# Patient Record
Sex: Female | Born: 1960 | Race: White | Hispanic: No | Marital: Single | Smoking: Never smoker
Health system: Southern US, Community
[De-identification: ages and names within clinical notes are randomized; demographics above are authoritative.]

## PROBLEM LIST (undated history)

## (undated) DIAGNOSIS — Z923 Personal history of irradiation: Secondary | ICD-10-CM

## (undated) DIAGNOSIS — E039 Hypothyroidism, unspecified: Secondary | ICD-10-CM

## (undated) DIAGNOSIS — M8580 Other specified disorders of bone density and structure, unspecified site: Secondary | ICD-10-CM

## (undated) DIAGNOSIS — J301 Allergic rhinitis due to pollen: Secondary | ICD-10-CM

## (undated) DIAGNOSIS — I10 Essential (primary) hypertension: Secondary | ICD-10-CM

## (undated) DIAGNOSIS — K5790 Diverticulosis of intestine, part unspecified, without perforation or abscess without bleeding: Secondary | ICD-10-CM

## (undated) DIAGNOSIS — Z973 Presence of spectacles and contact lenses: Secondary | ICD-10-CM

## (undated) DIAGNOSIS — C50919 Malignant neoplasm of unspecified site of unspecified female breast: Secondary | ICD-10-CM

## (undated) HISTORY — DX: Diverticulosis of intestine, part unspecified, without perforation or abscess without bleeding: K57.90

## (undated) HISTORY — DX: Allergic rhinitis due to pollen: J30.1

## (undated) HISTORY — DX: Essential (primary) hypertension: I10

## (undated) HISTORY — PX: LAPAROSCOPIC PELVIC LYMPH NODE BIOPSY: SHX5914

## (undated) HISTORY — PX: INGUINAL HERNIA REPAIR: SUR1180

## (undated) HISTORY — PX: TONSILLECTOMY: SUR1361

## (undated) HISTORY — DX: Other specified disorders of bone density and structure, unspecified site: M85.80

## (undated) HISTORY — DX: Malignant neoplasm of unspecified site of unspecified female breast: C50.919

## (undated) HISTORY — PX: WISDOM TOOTH EXTRACTION: SHX21

## (undated) HISTORY — PX: COLONOSCOPY: SHX174

---

## 2006-04-22 HISTORY — PX: OTHER SURGICAL HISTORY: SHX169

## 2008-04-22 DIAGNOSIS — C50919 Malignant neoplasm of unspecified site of unspecified female breast: Secondary | ICD-10-CM

## 2008-04-22 HISTORY — PX: MASTECTOMY: SHX3

## 2008-04-22 HISTORY — DX: Malignant neoplasm of unspecified site of unspecified female breast: C50.919

## 2011-03-03 ENCOUNTER — Encounter: Payer: Self-pay | Admitting: *Deleted

## 2011-03-03 NOTE — Progress Notes (Signed)
Mailed after appt letter to pt. 

## 2011-03-05 ENCOUNTER — Other Ambulatory Visit: Payer: Self-pay | Admitting: *Deleted

## 2011-03-05 DIAGNOSIS — C50919 Malignant neoplasm of unspecified site of unspecified female breast: Secondary | ICD-10-CM

## 2011-03-08 ENCOUNTER — Ambulatory Visit (INDEPENDENT_AMBULATORY_CARE_PROVIDER_SITE_OTHER): Payer: BC Managed Care – PPO | Admitting: Internal Medicine

## 2011-03-08 ENCOUNTER — Encounter: Payer: Self-pay | Admitting: Internal Medicine

## 2011-03-08 DIAGNOSIS — J309 Allergic rhinitis, unspecified: Secondary | ICD-10-CM

## 2011-03-08 DIAGNOSIS — E559 Vitamin D deficiency, unspecified: Secondary | ICD-10-CM

## 2011-03-08 DIAGNOSIS — I10 Essential (primary) hypertension: Secondary | ICD-10-CM

## 2011-03-08 DIAGNOSIS — C50919 Malignant neoplasm of unspecified site of unspecified female breast: Secondary | ICD-10-CM | POA: Insufficient documentation

## 2011-03-08 MED ORDER — LOSARTAN POTASSIUM 25 MG PO TABS: 25.0000 mg | ORAL_TABLET | Freq: Every day | ORAL | Status: DC

## 2011-03-08 NOTE — Progress Notes (Signed)
Subjective:    Patient ID: Erin [name], female    DOB: Dec 05, 1960, 50 y.o.   MRN: [id number]  HPI  50 year old white female to establish. She recently moved to [place] from [place]. She works as a Airline pilot at [employer]. Her past medical history is notable for breast cancer diagnosed in 2010. Patient noted to have stage II, T2 N0M0 with 3.2 cm tumor, ER/PR positive, HER-2/nu negative, status post mastectomy with close surgical margin. She had definitive radiation therapy. She did not undergo chemotherapy. Her followups q. 6 months have been normal. She has upcoming appointment with Dr. Aaron Edelman.  She is currently taking tamoxifen as adjuvant therapy.  Her bone density has been monitored every 2 years. Her last bone density was completed in March 2011. Her bone mineral density of lumbar spine and hips were normal.  Her genetic testing was negative for [facility] abnormality.  Patient also diagnosed with hypertension 2 years ago. Her blood pressure was only mildly elevated. She currently takes losartan 25 mg once daily. She periodically monitors her blood pressure at home. Her blood pressure readings are normal. Patient noted to have occasional elevated readings at physician's office.  Patient also has history of allergic rhinitis. She has been allergy tested in the past. She is very sensitive to grass pollens.   Review of Systems  Constitutional: Negative for activity change, appetite change and unexpected weight change.  Eyes: Negative for visual disturbance.  Respiratory: Negative for cough, chest tightness and shortness of breath.   Cardiovascular: Negative for chest pain.  Genitourinary: Negative for difficulty urinating.  Neurological: Negative for headaches.  Gastrointestinal: Negative for abdominal pain, heartburn melena or hematochezia Psych: Negative for depression or anxiety  Past Medical History  Diagnosis Date  . Breast cancer   . Hypertension   . Allergic rhinitis due to  pollen     History   Social History  . Marital Status: Single    Spouse Name: N/A    Number of Children: N/A  . Years of Education: N/A   Occupational History  . Not on file.   Social History Main Topics  . Smoking status: Never Smoker   . Smokeless tobacco: Not on file  . Alcohol Use: Yes  . Drug Use: No  . Sexually Active: Not on file   Other Topics Concern  . Not on file   Social History Narrative   She grew up in [place] last 16 yrs in [place]    Past Surgical History  Procedure Date  . Mastectomy     bilateral  . Tonsillectomy   . Right knee arthroscopy 2008    No family history on file.  Allergies  Allergen Reactions  . Adhesive (Tape) Rash    No current outpatient prescriptions on file prior to visit.    BP 162/90  Pulse 79  Temp(Src) 98.3 F (36.8 C) (Oral)  Ht 5\' 5"  (1.651 m)  Wt 149 lb (67.586 kg)  BMI 24.79 kg/m2      Objective:   Physical Exam   Constitutional: Appears well-developed and well-nourished. No distress.  Head: Normocephalic and atraumatic.  Ear:  Right and left ear normal.  TMs clear.  Hearing is grossly normal Mouth/Throat: Oropharynx is clear and moist.  Eyes: Conjunctivae are normal. Pupils are equal, round, and reactive to light.  Neck: Normal range of motion. Neck supple. No thyromegaly present. No carotid bruit Chest:  Bilateral mastectomy Cardiovascular: Normal rate, regular rhythm and normal heart sounds.  Exam reveals no gallop and no  friction rub.  No murmur heard. Pulmonary/Chest: Effort normal and breath sounds normal.  No wheezes. No rales.  Abdominal: Soft. Bowel sounds are normal. No mass. There is no tenderness.  Neurological: Alert. No cranial nerve deficit.  Skin: Skin is warm and dry.  Psychiatric: Normal mood and affect. Behavior is normal.       Assessment & Plan:

## 2011-03-08 NOTE — Assessment & Plan Note (Signed)
We discussed allergy blood testing next year.  Patient to call if she needs refill of her Rhinocort and Clarinex. Patient advised to try Allegra or fexofenadine 180 mg over-the-counter.  Patient was treated with allergy shots when she lived in [place].  I anticipate her allergy symptoms may be worse living in [place].

## 2011-03-08 NOTE — Assessment & Plan Note (Signed)
50 year old with history of left breast cancer. She is stage II, T2 N0M0 with 3.2 cm tumor, ER/PR positive, HER-2/nu negative, status post bilateral mastectomy with close surgical margin. She was treated with definitive radiation therapy. She did not have chemotherapy. She is on tamoxifen for adjuvant therapy. She has followup with oncology for surveillance.  I suggest we consider repeating her bone density next year. She has low normal vitamin D level. Patient advised to increase her dose to 4000 units daily.

## 2011-03-08 NOTE — Assessment & Plan Note (Signed)
Home blood pressure readings are very well controlled. She has white coat hypertension. Patient to continue current dose of losartan 25 mg once daily. Monitor electrolytes and kidney function yearly.

## 2011-03-08 NOTE — Patient Instructions (Addendum)
Please complete the following lab tests within one month: BMET - 401.9 Vit D level - 268

## 2011-04-04 ENCOUNTER — Ambulatory Visit (HOSPITAL_BASED_OUTPATIENT_CLINIC_OR_DEPARTMENT_OTHER): Payer: BC Managed Care – PPO | Admitting: Oncology

## 2011-04-04 ENCOUNTER — Other Ambulatory Visit (HOSPITAL_BASED_OUTPATIENT_CLINIC_OR_DEPARTMENT_OTHER): Payer: BC Managed Care – PPO

## 2011-04-04 ENCOUNTER — Ambulatory Visit (HOSPITAL_BASED_OUTPATIENT_CLINIC_OR_DEPARTMENT_OTHER): Payer: BC Managed Care – PPO

## 2011-04-04 VITALS — BP 135/85 | HR 78 | Temp 98.0°F | Ht 64.5 in | Wt 150.8 lb

## 2011-04-04 DIAGNOSIS — Z170 Estrogen receptor positive status [ER+]: Secondary | ICD-10-CM

## 2011-04-04 DIAGNOSIS — C50919 Malignant neoplasm of unspecified site of unspecified female breast: Secondary | ICD-10-CM

## 2011-04-04 LAB — COMPREHENSIVE METABOLIC PANEL
Albumin: 3.8 g/dL (ref 3.5–5.2)
BUN: 13 mg/dL (ref 6–23)
CO2: 26 mEq/L (ref 19–32)
Calcium: 9.5 mg/dL (ref 8.4–10.5)
Glucose, Bld: 88 mg/dL (ref 70–99)
Potassium: 3.8 mEq/L (ref 3.5–5.3)
Sodium: 134 mEq/L — ABNORMAL LOW (ref 135–145)
Total Protein: 7.1 g/dL (ref 6.0–8.3)

## 2011-04-04 LAB — CBC WITH DIFFERENTIAL/PLATELET
Basophils Absolute: 0 10*3/uL (ref 0.0–0.1)
Eosinophils Absolute: 0 10*3/uL (ref 0.0–0.5)
HGB: 13.7 g/dL (ref 11.6–15.9)
MCV: 90.3 fL (ref 79.5–101.0)
MONO#: 0.4 10*3/uL (ref 0.1–0.9)
NEUT#: 4.3 10*3/uL (ref 1.5–6.5)
Platelets: 223 10*3/uL (ref 145–400)
RBC: 4.47 10*6/uL (ref 3.70–5.45)
RDW: 12.5 % (ref 11.2–14.5)
WBC: 6.2 10*3/uL (ref 3.9–10.3)

## 2011-04-07 NOTE — Progress Notes (Signed)
Erin [name]  MR#: [id number]    History of present illness: Erin [name] is a 50 year old woman recently moved to this area with a history of breast cancer, establishing herself in our care today.  She had routine screening mammography December of 2009 at the [facility] in [place], [place], showing an increasing asymmetry in her left breast. She was recalled for additional views December 29. She was noted to have heterogeneously dense breasts. It was a 5 cm area of architectural distortion in the lateral aspect of the left breast with no associated calcifications. There was a second lesion medial to this. Ultrasound showed a highly suspicious hypoechoic irregularly marginated mass measuring 3 cm at the 2:30 position 5 cm from the nipple. This was palpable to the mammographer. The second area in question I measured 7 mm and a third lesion was noted measuring 5 mm. Some left axillary lymph nodes were morphologically normal.  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 Bilateral breast MRIs were performed 05/04/2008 and showed, in the left breast, a large lobulated mass measuring up to 4.3 cm, and including both of the apparently separate masses the previously biopsied. In the right breast there were 2 indeterminate lesions. These were evaluated further with breast specific gamma imaging performed January 14 in both lesions were negative. The lesion in the left breast was markedly abnormal.  Given this complex history and with the background of significant breast density, the patient opted for bilateral mastectomies with left sentinel lymph node sampling. This was performed of 06/27/2008 and showed(S. [id number]) in the right breast, no malignancy. In the left breast there was a 3.2 cm invasive ductal carcinoma, grade 1, focally extending to the  anterior margin of the lower outer quadrant. There was extensive angiolymphatic invasion, but both sentinel lymph nodes on the left were negative.  The patient received left-sided postmastectomy radiation including of the left chest wall and left supraclavicular fossa to a total dose of 50.4 Gy plus a 10 Gy scar boost. She had an Oncotype DX recurrence score of 17, further discussed below. She decided to forego reconstruction. She was tested for BRCA1 and 2 and was found to be negative. Given her overall prognosis, she did not receive radiation, but started tamoxifen February of 2010, with good tolerance.  Past medical history:      Past Medical History  Diagnosis Date  . Breast cancer   . Hypertension   . Allergic rhinitis due to pollen     Past surgical history:      Past Surgical History  Procedure Date  . Mastectomy     bilateral  . Tonsillectomy   . Right knee arthroscopy 2008    Family history:    The patient's mother has a history of recurrent lung cancer. She is currently 49, and lives in [place]. The patient has not been in touch with her father her for approximately 30 years. She had no sisters, 1 brother, who is in good health. There is no breast or ovarian cancer in the family to her knowledge.  Gynecologic history: GX P0, menarche at around age 50, most recent period started 2 days ago. Her periods have never been regular; lately they have been briefer with scant flow.    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 she lives alone and has no pets.  ADVANCED DIRECTIVES:  Health maintenance:       History  Substance Use Topics  . Smoking status: Never Smoker   . Smokeless tobacco: Not on file  . Alcohol Use: Yes      Colonoscopy: n/a  PAP: Feb 2011  Bone density: 06/2009, nl  Cholesterol: "good"  Interval history: The patient recently moved to [facility]; she is establishing herself at the university; so far she is settling in without any unusual  problems. She is exercising on a regular basis.  Review of systems:  She has significant seasonal allergies. Occasionally she feels some tightness or stiffness regarding the left incision. She's never had problems with swelling however. Occasionally she gets sinus associated headaches. She is having moderate hot flashes. A detailed review of systems was otherwise noncontributory.    Allergies:     Allergies  Allergen Reactions  . Adhesive (Tape) Rash    Medications:      Current Outpatient Prescriptions  Medication Sig Dispense Refill  . acyclovir ointment (ZOVIRAX) 5 % Apply topically every 3 (three) hours.        . budesonide (RHINOCORT AQUA) 32 MCG/ACT nasal spray Place 2 sprays into the nose daily.        . Cholecalciferol (VITAMIN D3) 2000 UNITS capsule Take 2,000 Units by mouth daily.        Marland Kitchen desloratadine (CLARINEX) 5 MG tablet Take 5 mg by mouth daily.        Marland Kitchen losartan (COZAAR) 25 MG tablet Take 1 tablet (25 mg total) by mouth daily.  90 tablet  1  . Multiple Vitamin (MULTIVITAMIN) tablet Take 1 tablet by mouth daily.        . tamoxifen (NOLVADEX) 20 MG tablet Take 20 mg by mouth daily.          Physical exam:      Filed Vitals:   04/04/11 1631  BP: 135/85  Pulse: 78  Temp: 98 F (36.7 C)     Body mass index is 25.48 kg/(m^2).   ECOG performance status: 0   Lab results:            Chemistry      Component Value Date/Time   NA 134* 04/04/2011 1605   K 3.8 04/04/2011 1605   CL 99 04/04/2011 1605   CO2 26 04/04/2011 1605   BUN 13 04/04/2011 1605   CREATININE 0.83 04/04/2011 1605      Component Value Date/Time   CALCIUM 9.5 04/04/2011 1605   ALKPHOS 56 04/04/2011 1605   AST 16 04/04/2011 1605   ALT 14 04/04/2011 1605   BILITOT 0.4 04/04/2011 1605         Lab Results  Component Value Date   WBC 6.2 04/04/2011   HGB 13.7 04/04/2011   HCT 40.3 04/04/2011   MCV 90.3 04/04/2011   PLT 223 04/04/2011   NEUTROABS 4.3 04/04/2011    Studies:     As  her ready discussed. She had an unremarkable chest x-ray January of 2010.  Assessment: 50 year old BRCA 1/2 negative Erin [name] woman , status post bilateral mastectomies January 2010 for a left-sided T2 N0 (stage II) invasive ductal carcinoma, grade 1, strongly estrogen and progesterone receptor positive, HER-2 nonamplified, with an Oncotype DX recurrence score of 17, status post left postmastectomy radiation, on tamoxifen since February of 2010, with good tolerance.  Plan: We went over her history in detail. She understands that and not the type recurrence score of 17 falls in the intermediate category as per  the [place] [facility] study, but in the low risk category as per the original interpretation of the test. Accordingly I think the decision to forego chemotherapy was the right one. In addition, her prognostic profile maps out as a luminal a subtype, and again this group of tumors generally does not benefit from chemotherapy.  The plan then is to continue tamoxifen for now. Once she is postmenopausal, she will have the option of switching to an aromatase inhibitor. Whether she would like to be treated with 5 or 10 years total of anti-estrogen therapy will depend partly on when her menses stop, but we did go over that data in detail today including the new data suggesting that 10 years of tamoxifen is superior to 5. She had questions regarding vitamin D and I think her level currently being just above 30 is adequate. She also wondered if she should have some staging studies and we discussed that in detail as well. Basically the strong recommendation in cases like hers is not to do scans except to evaluate specific symptoms.  I would be comfortable seeing her on a once a year basis, and she will see me again in April of 2013. She will have lab work before that visit. She knows to call me for any problems that may develop before that.   [name],[name] 04/07/2011

## 2011-04-08 ENCOUNTER — Other Ambulatory Visit: Payer: Self-pay | Admitting: Lab

## 2011-07-31 ENCOUNTER — Other Ambulatory Visit: Payer: Self-pay | Admitting: *Deleted

## 2011-07-31 DIAGNOSIS — C50919 Malignant neoplasm of unspecified site of unspecified female breast: Secondary | ICD-10-CM

## 2011-08-01 ENCOUNTER — Other Ambulatory Visit (HOSPITAL_BASED_OUTPATIENT_CLINIC_OR_DEPARTMENT_OTHER): Payer: BC Managed Care – PPO | Admitting: Lab

## 2011-08-01 DIAGNOSIS — C50919 Malignant neoplasm of unspecified site of unspecified female breast: Secondary | ICD-10-CM

## 2011-08-01 LAB — CBC WITH DIFFERENTIAL/PLATELET
BASO%: 0.4 % (ref 0.0–2.0)
Eosinophils Absolute: 0.1 10*3/uL (ref 0.0–0.5)
LYMPH%: 24.3 % (ref 14.0–49.7)
MCHC: 35 g/dL (ref 31.5–36.0)
MONO#: 0.4 10*3/uL (ref 0.1–0.9)
NEUT#: 3.8 10*3/uL (ref 1.5–6.5)
RBC: 4.35 10*6/uL (ref 3.70–5.45)
RDW: 12 % (ref 11.2–14.5)
WBC: 5.6 10*3/uL (ref 3.9–10.3)
lymph#: 1.4 10*3/uL (ref 0.9–3.3)

## 2011-08-01 LAB — COMPREHENSIVE METABOLIC PANEL
ALT: 9 U/L (ref 0–35)
Albumin: 3.6 g/dL (ref 3.5–5.2)
CO2: 27 mEq/L (ref 19–32)
Chloride: 100 mEq/L (ref 96–112)
Potassium: 4 mEq/L (ref 3.5–5.3)
Sodium: 136 mEq/L (ref 135–145)
Total Bilirubin: 0.4 mg/dL (ref 0.3–1.2)
Total Protein: 6.5 g/dL (ref 6.0–8.3)

## 2011-08-01 LAB — CANCER ANTIGEN 27.29: CA 27.29: 14 U/mL (ref 0–39)

## 2011-08-07 ENCOUNTER — Other Ambulatory Visit: Payer: Self-pay | Admitting: Oncology

## 2011-08-13 ENCOUNTER — Ambulatory Visit (HOSPITAL_BASED_OUTPATIENT_CLINIC_OR_DEPARTMENT_OTHER): Payer: BC Managed Care – PPO | Admitting: Oncology

## 2011-08-13 VITALS — BP 154/78 | HR 85 | Temp 98.1°F | Ht 64.5 in | Wt 155.1 lb

## 2011-08-13 DIAGNOSIS — E559 Vitamin D deficiency, unspecified: Secondary | ICD-10-CM

## 2011-08-13 DIAGNOSIS — C50919 Malignant neoplasm of unspecified site of unspecified female breast: Secondary | ICD-10-CM

## 2011-08-13 DIAGNOSIS — C50519 Malignant neoplasm of lower-outer quadrant of unspecified female breast: Secondary | ICD-10-CM

## 2011-08-13 DIAGNOSIS — Z170 Estrogen receptor positive status [ER+]: Secondary | ICD-10-CM

## 2011-08-13 MED ORDER — ACYCLOVIR 5 % EX OINT: TOPICAL_OINTMENT | CUTANEOUS | Status: DC

## 2011-08-13 MED ORDER — BUDESONIDE 32 MCG/ACT NA SUSP: 2.0000 | Freq: Every day | NASAL | Status: DC

## 2011-08-13 NOTE — Progress Notes (Signed)
Erin [name]  MR#: [id number]    History of present illness: Erin [name] is a 51 year old woman recently moved to this area with a history of breast cancer, establishing herself in our care today.  She had routine screening mammography December of 2009 at the [facility] in [place], [place], showing an increasing asymmetry in her left breast. She was recalled for additional views December 29. She was noted to have heterogeneously dense breasts. It was a 5 cm area of architectural distortion in the lateral aspect of the left breast with no associated calcifications. There was a second lesion medial to this. Ultrasound showed a highly suspicious hypoechoic irregularly marginated mass measuring 3 cm at the 2:30 position 5 cm from the nipple. This was palpable to the mammographer. The second area in question I measured 7 mm and a third lesion was noted measuring 5 mm. Some left axillary lymph nodes were morphologically normal.  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 Bilateral breast MRIs were performed 05/04/2008 and showed, in the left breast, a large lobulated mass measuring up to 4.3 cm, and including both of the apparently separate masses the previously biopsied. In the right breast there were 2 indeterminate lesions. These were evaluated further with breast specific gamma imaging performed January 14 in both lesions were negative. The lesion in the left breast was markedly abnormal.  Given this complex history and with the background of significant breast density, the patient opted for bilateral mastectomies with left sentinel lymph node sampling. This was performed of 06/27/2008 and showed(S. [id number]) in the right breast, no malignancy. In the left breast there was a 3.2 cm invasive ductal carcinoma, grade 1, focally extending to the  anterior margin of the lower outer quadrant. There was extensive angiolymphatic invasion, but both sentinel lymph nodes on the left were negative.  The patient received left-sided postmastectomy radiation including of the left chest wall and left supraclavicular fossa to a total dose of 50.4 Gy plus a 10 Gy scar boost. She had an Oncotype DX recurrence score of 17, further discussed below. She decided to forego reconstruction. She was tested for BRCA1 and 2 and was found to be negative. Given her overall prognosis, she did not receive radiation, but started tamoxifen February of 2010, with good tolerance.  Interval history: the patient returns today for routine followup of her breast cancer. 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 She has not yet established herself with her primary care physician here. She is exercising regularly, but looking for a better gem. (She does have an elliptical at home).  Review of systems:  Is having mild sinus symptoms from the incredible amount of pollen outside. She takes Claritin with success for that. She has easy bruising, which is not a new problem for her. Last menstrual period was 2 weeks ago. There are perhaps a little bit more scans, but still very regular. She is having mild hot flashes. Otherwise a detailed review of systems today was noncontributory  Past medical history:      Past Medical History  Diagnosis Date  . Breast cancer   . Hypertension   . Allergic rhinitis due to pollen     Past surgical history:      Past Surgical History  Procedure Date  . Mastectomy     bilateral  .  Tonsillectomy   . Right knee arthroscopy 2008    Family history:    The patient's mother has a history of recurrent lung cancer. She is currently 61, and lives in [place]. The patient has not been in touch with her father her for approximately 30 years. She had no sisters, 1 brother, who is in good health. There is no breast or  ovarian cancer in the family to her knowledge.  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    ADVANCED DIRECTIVES: not in place  Health maintenance:       History  Substance Use Topics  . Smoking status: Never Smoker   . Smokeless tobacco: Not on file  . Alcohol Use: Yes      Colonoscopy: n/a  PAP: Feb 2011  Bone density: 06/2009, nl  Cholesterol: "good"    Allergies:     Allergies  Allergen Reactions  . Adhesive (Tape) Rash    Medications:      Current Outpatient Prescriptions  Medication Sig Dispense Refill  . budesonide (RHINOCORT AQUA) 32 MCG/ACT nasal spray Place 2 sprays into the nose daily.        . Cholecalciferol (VITAMIN D3) 2000 UNITS capsule Take 2,000 Units by mouth daily.        Marland Kitchen desloratadine (CLARINEX) 5 MG tablet Take 5 mg by mouth daily.        Marland Kitchen losartan (COZAAR) 25 MG tablet Take 1 tablet (25 mg total) by mouth daily.  90 tablet  1  . Multiple Vitamin (MULTIVITAMIN) tablet Take 1 tablet by mouth daily.        . tamoxifen (NOLVADEX) 20 MG tablet Take 20 mg by mouth daily.        Marland Kitchen acyclovir ointment (ZOVIRAX) 5 % Apply topically every 3 (three) hours.          Physical exam:  Middle-aged white woman who appears fit    Filed Vitals:   08/13/11 1557  BP: 154/78  Pulse: 85  Temp: 98.1 F (36.7 C)     Body mass index is 26.21 kg/(m^2).   ECOG performance status: 0 Oropharynx is clear  There is no peripheral adenopathy  Lungs show no crackles or wheezes  Heart regular rate and rhythm with no murmurs appreciated  Abdomen soft nontender positive bowel sounds  Musculoskeletal exam shows no focal spinal tenderness, no peripheral edema  Neurologic exam is nonfocal  Breast exam shows bilateral mastectomies. There is no evidence of local recurrence.  Lab results:            Chemistry      Component Value  Date/Time   NA 136 08/01/2011 1550   K 4.0 08/01/2011 1550   CL 100 08/01/2011 1550   CO2 27 08/01/2011 1550   BUN 17 08/01/2011 1550   CREATININE 0.81 08/01/2011 1550      Component Value Date/Time   CALCIUM 8.7 08/01/2011 1550   ALKPHOS 58 08/01/2011 1550   AST 17 08/01/2011 1550   ALT 9 08/01/2011 1550   BILITOT 0.4 08/01/2011 1550         Lab Results  Component Value Date   WBC 5.6 08/01/2011   HGB 12.9 08/01/2011   HCT 36.9 08/01/2011   MCV 84.8 08/01/2011   PLT 197 08/01/2011   NEUTROABS 3.8 08/01/2011    Studies:  Dexa scan 07/10/2009 normal  Assessment: 51 year old BRCA 1/2 negative [place] woman , status post bilateral mastectomies January 2010 for a left-sided T2 N0 (stage II) invasive ductal carcinoma, grade 1, strongly estrogen and progesterone receptor positive, HER-2 nonamplified, with an Oncotype DX recurrence score of 17, status post left postmastectomy radiation, on tamoxifen since February of 2010, with good tolerance.  Plan: we again briefly reviewed her prognosis, which is good. She understands she is still premenopausal and therefore we are not ready to switch to an aromatase inhibitor. We also discussed the possibility of continuing tamoxifen for total of 10 years, or going for 5 years and then switching to an aromatase inhibitor. At this point we're simply continuing on tamoxifen, and she will return to see me in 6 months. We will do routine physical exam and lab work at that time.  Today I reviewed some of her medications including the budesonide and Zovirax, and I wrote her for additional breast prostheses and postsurgical bras. She will let me know if she has any trouble obtaining these. Otherwise she will call for any problems that may develop before the next visit.  Lex Linhares C 08/13/2011

## 2011-08-14 ENCOUNTER — Telehealth: Payer: Self-pay | Admitting: Oncology

## 2011-08-14 NOTE — Telephone Encounter (Signed)
S/w the pt and she is aware of her oct 2013 appts 

## 2011-09-04 ENCOUNTER — Encounter: Payer: Self-pay | Admitting: Internal Medicine

## 2011-09-04 ENCOUNTER — Ambulatory Visit (INDEPENDENT_AMBULATORY_CARE_PROVIDER_SITE_OTHER): Payer: BC Managed Care – PPO | Admitting: Internal Medicine

## 2011-09-04 VITALS — BP 110/80 | Temp 98.3°F | Wt 154.0 lb

## 2011-09-04 DIAGNOSIS — J309 Allergic rhinitis, unspecified: Secondary | ICD-10-CM

## 2011-09-04 DIAGNOSIS — M8580 Other specified disorders of bone density and structure, unspecified site: Secondary | ICD-10-CM | POA: Insufficient documentation

## 2011-09-04 DIAGNOSIS — M899 Disorder of bone, unspecified: Secondary | ICD-10-CM

## 2011-09-04 DIAGNOSIS — Z7189 Other specified counseling: Secondary | ICD-10-CM | POA: Insufficient documentation

## 2011-09-04 DIAGNOSIS — Z0000 Encounter for general adult medical examination without abnormal findings: Secondary | ICD-10-CM | POA: Insufficient documentation

## 2011-09-04 MED ORDER — AZELASTINE HCL 0.1 % NA SOLN: 2.0000 | Freq: Two times a day (BID) | NASAL | Status: DC

## 2011-09-04 MED ORDER — LOSARTAN POTASSIUM 25 MG PO TABS: 25.0000 mg | ORAL_TABLET | Freq: Every day | ORAL | Status: DC

## 2011-09-04 NOTE — Assessment & Plan Note (Signed)
Patient has mild osteopenia. Continue vitamin D and calcium supplement. Repeat DEXA scan this year.

## 2011-09-04 NOTE — Patient Instructions (Addendum)
Please complete the following lab tests in June 2013: [facility] Allergy panel - 477.9 Vitamin D level - 268.9

## 2011-09-04 NOTE — Assessment & Plan Note (Signed)
Use allegra 180 mg daily.  Add astelin nose spray.  Arrange allergy panel in June.  If symptoms still uncontrolled, we discussed allergy referral.

## 2011-09-04 NOTE — Assessment & Plan Note (Signed)
Refer for screening colonoscopy especially considering her history of breast cancer.

## 2011-09-04 NOTE — Progress Notes (Signed)
Subjective:    Patient ID: Erin [name], female    DOB: 1961-04-12, 51 y.o.   MRN: [id number]  HPI  51 year old white female with history of breast cancer and hypertension for routine followup. Since previous visit patient was seen by oncologist. The patient to remain on tamoxifen.    Patient reports having flare of allergic rhinitis. She was previously seen by allergist when she was living in [place]. She has multiple sensitivities to various environmental agents. She was previously on allergy shots. She has been currently using over-the-counter Claritin but stopped her Rhinocort secondary to  potential concern of glaucoma. She has upcoming eye doctor appointment.  We reviewed her previous bone density scan was completed in March of 2011. She has very mild osteopenia. She is continuing to take vitamin D 2000 units day and was instructed by her oncologist to take [phone] mg of calcium daily.   Review of Systems  Past Medical History  Diagnosis Date  . Breast cancer   . Hypertension   . Allergic rhinitis due to pollen     History   Social History  . Marital Status: Single    Spouse Name: N/A    Number of Children: N/A  . Years of Education: N/A   Occupational History  . Not on file.   Social History Main Topics  . Smoking status: Never Smoker   . Smokeless tobacco: Not on file  . Alcohol Use: Yes  . Drug Use: No  . Sexually Active: Not on file   Other Topics Concern  . Not on file   Social History Narrative   She grew up in [place] last 16 yrs in [place]    Past Surgical History  Procedure Date  . Mastectomy     bilateral  . Tonsillectomy   . Right knee arthroscopy 2008    No family history on file.  Allergies  Allergen Reactions  . Adhesive (Tape) Rash    Current Outpatient Prescriptions on File Prior to Visit  Medication Sig Dispense Refill  . acyclovir ointment (ZOVIRAX) 5 % Apply topically every 3 (three) hours.  15 g  12  . budesonide  (RHINOCORT AQUA) 32 MCG/ACT nasal spray Place 2 sprays into the nose daily.  8.6 g  12  . Cholecalciferol (VITAMIN D3) 2000 UNITS capsule Take 2,000 Units by mouth daily.        Marland Kitchen desloratadine (CLARINEX) 5 MG tablet Take 5 mg by mouth daily.        Marland Kitchen losartan (COZAAR) 25 MG tablet Take 1 tablet (25 mg total) by mouth daily.  90 tablet  1  . Multiple Vitamin (MULTIVITAMIN) tablet Take 1 tablet by mouth daily.        . tamoxifen (NOLVADEX) 20 MG tablet Take 20 mg by mouth daily.          BP 110/80  Temp(Src) 98.3 F (36.8 C) (Oral)  Wt 154 lb (69.854 kg)       Objective:   Physical Exam  Constitutional: She is oriented to person, place, and time. She appears well-developed and well-nourished.  Cardiovascular: Normal rate, regular rhythm and normal heart sounds.   No murmur heard. Pulmonary/Chest: Effort normal and breath sounds normal. She has no wheezes. She has no rales.  Neurological: She is alert and oriented to person, place, and time.  Skin: Skin is warm and dry.  Psychiatric: She has a normal mood and affect. Her behavior is normal.       Assessment & Plan:

## 2011-09-04 NOTE — Assessment & Plan Note (Signed)
Stable. No change in medication.  BP: 110/80 mmHg  Lab Results  Component Value Date   CREATININE 0.81 08/01/2011

## 2011-09-23 ENCOUNTER — Ambulatory Visit (INDEPENDENT_AMBULATORY_CARE_PROVIDER_SITE_OTHER)
Admission: RE | Admit: 2011-09-23 | Discharge: 2011-09-23 | Disposition: A | Discharge status: Home / Self Care | Payer: BC Managed Care – PPO | Source: Ambulatory Visit | Attending: Internal Medicine | Admitting: Internal Medicine

## 2011-09-23 ENCOUNTER — Other Ambulatory Visit: Payer: BC Managed Care – PPO

## 2011-09-23 DIAGNOSIS — M899 Disorder of bone, unspecified: Secondary | ICD-10-CM

## 2011-09-23 DIAGNOSIS — J309 Allergic rhinitis, unspecified: Secondary | ICD-10-CM

## 2011-09-23 DIAGNOSIS — M8580 Other specified disorders of bone density and structure, unspecified site: Secondary | ICD-10-CM

## 2011-09-24 LAB — ALLERGY PROFILE REGION II-DC, DE, MD, ~~LOC~~, VA
Allergen, D pternoyssinus,d7: <0.1 kU/L (ref <0.35)
Alternaria Alternata: <0.1 kU/L (ref <0.35)
Aspergillus fumigatus, IgG: <0.1 kU/L (ref <0.35)
Cat Dander: <0.1 kU/L (ref <0.35)
D. farinae: <0.1 kU/L (ref <0.35)
Elm IgE: <0.1 kU/L (ref <0.35)
IgE (Immunoglobulin E), Serum: 7.7 IU/mL (ref 0.0–180.0)
Oak: <0.1 kU/L (ref <0.35)

## 2011-09-27 ENCOUNTER — Encounter: Payer: Self-pay | Admitting: Internal Medicine

## 2011-10-03 NOTE — Progress Notes (Signed)
Pt informed- appointment made with labs prior

## 2011-10-17 ENCOUNTER — Encounter: Payer: Self-pay | Admitting: Internal Medicine

## 2011-10-18 ENCOUNTER — Ambulatory Visit (INDEPENDENT_AMBULATORY_CARE_PROVIDER_SITE_OTHER): Payer: BC Managed Care – PPO | Admitting: Internal Medicine

## 2011-10-18 ENCOUNTER — Encounter: Payer: Self-pay | Admitting: Internal Medicine

## 2011-10-18 VITALS — BP 112/66 | HR 94 | Ht 65.0 in | Wt 151.2 lb

## 2011-10-18 VITALS — BP 132/82 | HR 84 | Temp 98.4°F | Wt 152.0 lb

## 2011-10-18 DIAGNOSIS — M899 Disorder of bone, unspecified: Secondary | ICD-10-CM

## 2011-10-18 DIAGNOSIS — M8580 Other specified disorders of bone density and structure, unspecified site: Secondary | ICD-10-CM

## 2011-10-18 DIAGNOSIS — Z1211 Encounter for screening for malignant neoplasm of colon: Secondary | ICD-10-CM

## 2011-10-18 MED ORDER — NA SULFATE-K SULFATE-MG SULF 17.5-3.13-1.6 GM/177ML PO SOLN: 1.0000 | Freq: Once | ORAL | Status: DC

## 2011-10-18 NOTE — Patient Instructions (Addendum)
Keep your next follow up appointment in October 2013. Please complete the following lab tests before your next follow up appointment: BMET - 401.9 Vitamin D level - 733.9

## 2011-10-18 NOTE — Assessment & Plan Note (Signed)
Recent DEXA scan shows normal T. scores of lumbar spine. Right hip T score was -1.2. Continue calcium, vitamin D and weight bearing exercises.  Her osteoporosis risk will increase when she transitions to aromatase inhibitor.

## 2011-10-18 NOTE — Patient Instructions (Addendum)
You have been scheduled for a colonoscopy with propofol. Please follow written instructions given to you at your visit today.  Please pick up your prep kit at the pharmacy within the next 1-3 days.  

## 2011-10-18 NOTE — Progress Notes (Signed)
Patient ID: Erin [name], female   DOB: 1961-03-01, 51 y.o.   MRN: [id number]  SUBJECTIVE: HPI Erin [name] is a 51 year old female with a past medical history of breast cancer status post bilateral mastectomy and XRT on tamoxifen, hypertension and allergic rhinitis who seen in consultation at the request of Dr. Artist Pais for evaluation screening colonoscopy. She is alone today. She is doing very well and is without complaint. Her bowel habits are normal for her. She has not had blood in her stools or melena. No diarrhea or constipation.  No abdominal pain. Appetite is good. Weight is stable. No nausea or vomiting. No trouble with heartburn.  She wishes to discuss options for colon cancer screening and notes she will be 51 soon. She does not have a family history of colon polyps or colon cancer to  Review of Systems  As per history of present illness, otherwise negative   Past Medical History  Diagnosis Date  . Breast cancer   . Hypertension   . Allergic rhinitis due to pollen     Current Outpatient Prescriptions  Medication Sig Dispense Refill  . acyclovir ointment (ZOVIRAX) 5 % Apply topically every 3 (three) hours.  15 g  12  . azelastine (ASTELIN) 137 MCG/SPRAY nasal spray Place 2 sprays into the nose 2 (two) times daily. Use in each nostril as directed  30 mL  12  . budesonide (RHINOCORT AQUA) 32 MCG/ACT nasal spray Place 2 sprays into the nose daily. As needed      . calcium carbonate (OS-CAL) 600 MG TABS Take 600 mg by mouth 2 (two) times daily with a meal.      . Cholecalciferol (VITAMIN D3) 2000 UNITS capsule Take 2,000 Units by mouth daily.        Marland Kitchen desloratadine (CLARINEX) 5 MG tablet Take 5 mg by mouth daily. As needed      . losartan (COZAAR) 25 MG tablet Take 1 tablet (25 mg total) by mouth daily.  90 tablet  3  . Multiple Vitamin (MULTIVITAMIN) tablet Take 1 tablet by mouth daily.        . tamoxifen (NOLVADEX) 20 MG tablet Take 20 mg by mouth daily.        Marland Kitchen DISCONTD: budesonide  (RHINOCORT AQUA) 32 MCG/ACT nasal spray Place 2 sprays into the nose daily.  8.6 g  12  . Na Sulfate-K Sulfate-Mg Sulf (SUPREP BOWEL PREP) SOLN Take 1 kit by mouth once.  1 Bottle  0    Allergies  Allergen Reactions  . Adhesive (Tape) Rash    Family History  Problem Relation Age of Onset  . Hypertension Mother   . Hypertension Father   . Cancer Mother     Lung Cancer - long history of tobacco use    History  Substance Use Topics  . Smoking status: Never Smoker   . Smokeless tobacco: Not on file  . Alcohol Use: Yes    OBJECTIVE: BP 112/66  Pulse 94  Ht 5\' 5"  (1.651 m)  Wt 151 lb 3.2 oz (68.584 kg)  BMI 25.16 kg/m2 Constitutional: Well-developed and well-nourished. No distress. HEENT: Normocephalic and atraumatic. Oropharynx is clear and moist. No oropharyngeal exudate. Conjunctivae are normal. Pupils are equal round and reactive to light. No scleral icterus. Neck: Neck supple. Trachea midline. Cardiovascular: Normal rate, regular rhythm and intact distal pulses. No M/R/G Pulmonary/chest: Effort normal and breath sounds normal. No wheezing, rales or rhonchi. Abdominal: Soft, nontender, nondistended. Bowel sounds active throughout. There are no masses palpable.  No hepatosplenomegaly. Extremities: no clubbing, cyanosis, or edema Lymphadenopathy: No cervical adenopathy noted. Neurological: Alert and oriented to person place and time. Skin: Skin is warm and dry. No rashes noted. Psychiatric: Normal mood and affect. Behavior is normal.  ASSESSMENT AND PLAN:  51 year old female with a past medical history of breast cancer status post bilateral mastectomy and XRT on tamoxifen, hypertension and allergic rhinitis who seen in consultation at the request of Dr. Artist Pais for evaluation screening colonoscopy.  1. CRC screening, average risk -- the patient is well today and we have discussed primary prevention with colorectal cancer screening today in length. We discussed her options which  include colonoscopy, annual FOBT, barium enema, CT colonography.  We discussed the risks and benefits of colonoscopy in great detail. After this discussion, she wishes to proceed with colonoscopy. This will be performed with propofol sedation and she will use Suprep in split fashion.  We discussed her screening interval will be based on findings of this colonoscopy.

## 2011-10-18 NOTE — Progress Notes (Signed)
Subjective:    Patient ID: Erin [name], female    DOB: 1960-09-07, 51 y.o.   MRN: [id number]  HPI  51 year old white female with history of breast cancer and osteopenia for followup. Reviewed her recent DEXA scan. Compared to 2011 she has had mild progression. Her T score of her right hip is -1.2. She scores of lumbar spine are normal. She continues to take 2000 units of vitamin D daily along with 1200 mg of calcium.  No family hx of osteoporotic fractures.  She is told taking tamoxifen. She plans on transitioning to an aromatase inhibitor possibly within the next 2-3 years.  Her mother has been diagnosed with terminal lung cancer. She is trying to move her mother to [place] area. She is looking for assisted living facilities.  Allergy panel reviewed.  Previous skin testing in [place] showed sensitivity to grass pollen.  Review of Systems Negative for allergy symptoms.    Past Medical History  Diagnosis Date  . Breast cancer   . Hypertension   . Allergic rhinitis due to pollen     History   Social History  . Marital Status: Single    Spouse Name: N/A    Number of Children: N/A  . Years of Education: N/A   Occupational History  . Not on file.   Social History Main Topics  . Smoking status: Never Smoker   . Smokeless tobacco: Not on file  . Alcohol Use: Yes  . Drug Use: No  . Sexually Active: Not on file   Other Topics Concern  . Not on file   Social History Narrative   She grew up in [place] last 16 yrs in [place]    Past Surgical History  Procedure Date  . Mastectomy     bilateral  . Tonsillectomy   . Right knee arthroscopy 2008    Family History  Problem Relation Age of Onset  . Hypertension Mother   . Hypertension Father   . Cancer Mother     Lung Cancer - long history of tobacco use    Allergies  Allergen Reactions  . Adhesive (Tape) Rash    Current Outpatient Prescriptions on File Prior to Visit  Medication Sig Dispense Refill    . acyclovir ointment (ZOVIRAX) 5 % Apply topically every 3 (three) hours.  15 g  12  . budesonide (RHINOCORT AQUA) 32 MCG/ACT nasal spray Place 2 sprays into the nose daily. As needed      . calcium carbonate (OS-CAL) 600 MG TABS Take 600 mg by mouth 2 (two) times daily with a meal.      . Cholecalciferol (VITAMIN D3) 2000 UNITS capsule Take 2,000 Units by mouth daily.        Marland Kitchen desloratadine (CLARINEX) 5 MG tablet Take 5 mg by mouth daily. As needed      . losartan (COZAAR) 25 MG tablet Take 1 tablet (25 mg total) by mouth daily.  90 tablet  3  . Na Sulfate-K Sulfate-Mg Sulf (SUPREP BOWEL PREP) SOLN Take 1 kit by mouth once.  1 Bottle  0  . tamoxifen (NOLVADEX) 20 MG tablet Take 20 mg by mouth daily.        Marland Kitchen DISCONTD: budesonide (RHINOCORT AQUA) 32 MCG/ACT nasal spray Place 2 sprays into the nose daily.  8.6 g  12    BP 132/82  Pulse 84  Temp 98.4 F (36.9 C) (Oral)  Wt 152 lb (68.947 kg)       Objective:   Physical Exam  Constitutional: She is oriented to person, place, and time. She appears well-developed and well-nourished.  Cardiovascular: Normal rate, regular rhythm and normal heart sounds.   Pulmonary/Chest: Effort normal and breath sounds normal. She has no wheezes.  Neurological: She is alert and oriented to person, place, and time.  Psychiatric: She has a normal mood and affect. Her behavior is normal.          Assessment & Plan:

## 2011-12-10 ENCOUNTER — Encounter: Payer: BC Managed Care – PPO | Admitting: Internal Medicine

## 2011-12-12 ENCOUNTER — Encounter: Payer: BC Managed Care – PPO | Admitting: Internal Medicine

## 2012-01-10 ENCOUNTER — Ambulatory Visit: Payer: BC Managed Care – PPO | Admitting: Internal Medicine

## 2012-01-17 ENCOUNTER — Ambulatory Visit (INDEPENDENT_AMBULATORY_CARE_PROVIDER_SITE_OTHER): Payer: BC Managed Care – PPO | Admitting: Internal Medicine

## 2012-01-17 ENCOUNTER — Encounter: Payer: Self-pay | Admitting: Internal Medicine

## 2012-01-17 VITALS — BP 120/78 | HR 74 | Temp 98.2°F | Wt 150.0 lb

## 2012-01-17 DIAGNOSIS — I10 Essential (primary) hypertension: Secondary | ICD-10-CM

## 2012-01-17 DIAGNOSIS — Z23 Encounter for immunization: Secondary | ICD-10-CM

## 2012-01-17 DIAGNOSIS — M949 Disorder of cartilage, unspecified: Secondary | ICD-10-CM

## 2012-01-17 DIAGNOSIS — M8580 Other specified disorders of bone density and structure, unspecified site: Secondary | ICD-10-CM

## 2012-01-17 DIAGNOSIS — C50919 Malignant neoplasm of unspecified site of unspecified female breast: Secondary | ICD-10-CM

## 2012-01-17 LAB — BASIC METABOLIC PANEL
BUN: 17 mg/dL (ref 6–23)
Creatinine, Ser: 0.8 mg/dL (ref 0.4–1.2)
GFR: 86.88 mL/min (ref >60.00)
Glucose, Bld: 87 mg/dL (ref 70–99)
Potassium: 4.2 mEq/L (ref 3.5–5.1)

## 2012-01-17 LAB — CBC WITH DIFFERENTIAL/PLATELET
Eosinophils Relative: 0.7 % (ref 0.0–5.0)
HCT: 40.8 % (ref 36.0–46.0)
Monocytes Relative: 8.1 % (ref 3.0–12.0)
Neutrophils Relative %: 68.1 % (ref 43.0–77.0)
Platelets: 209 10*3/uL (ref 150.0–400.0)
WBC: 6.3 10*3/uL (ref 4.5–10.5)

## 2012-01-17 LAB — HEPATIC FUNCTION PANEL
ALT: 13 U/L (ref 0–35)
AST: 18 U/L (ref 0–37)
Albumin: 3.8 g/dL (ref 3.5–5.2)

## 2012-01-17 LAB — TSH: TSH: 1.96 u[IU]/mL (ref 0.35–5.50)

## 2012-01-17 NOTE — Assessment & Plan Note (Signed)
Continue calcium and vitamin D supplementation. Obtain vitamin D level. Plan to repeat DEXA scan in 2015.

## 2012-01-17 NOTE — Assessment & Plan Note (Signed)
Continue tamoxifen for adjuvant therapy.   Patient to transition to aromatase inhibitor as per Dr. Darnelle Catalan.

## 2012-01-17 NOTE — Patient Instructions (Addendum)
Please complete the following lab tests before your next follow up appointment: BMET - 401.9 FLP, CRP - 401.9

## 2012-01-17 NOTE — Assessment & Plan Note (Signed)
Well controlled.  Continue losartan 25 mg once daily. Monitor electrolytes and kidney function. Marland Kitchenlastbp1

## 2012-01-17 NOTE — Progress Notes (Signed)
Subjective:    Patient ID: Erin [name], female    DOB: 1960/07/30, 51 y.o.   MRN: [id number]  HPI  51 year old white female with history of breast cancer, hypertension and osteopenia for follow up.  Overall patient has been doing well. She is very compliant with her blood pressure medications. She denies any lightheadedness or dizziness.  Unfortunately her mother passed away since her last visit. It was a difficult summer to make funeral arrangements.  Osteopenia-patient condition continues to take her calcium and vitamin D supplement daily. We are awaiting her vitamin D level. She is taking additional 2000 units daily.  She was referred for screening colonoscopy. She could not complete during the summer to do family circumstances. She is having difficulty rescheduling for December of 2013.  Review of Systems See HPI  Past Medical History  Diagnosis Date  . Breast cancer   . Hypertension   . Allergic rhinitis due to pollen     History   Social History  . Marital Status: Single    Spouse Name: N/A    Number of Children: N/A  . Years of Education: N/A   Occupational History  . Not on file.   Social History Main Topics  . Smoking status: Never Smoker   . Smokeless tobacco: Not on file  . Alcohol Use: Yes  . Drug Use: No  . Sexually Active: Not on file   Other Topics Concern  . Not on file   Social History Narrative   She grew up in [place] last 16 yrs in [place]    Past Surgical History  Procedure Date  . Mastectomy     bilateral  . Tonsillectomy   . Right knee arthroscopy 2008    Family History  Problem Relation Age of Onset  . Hypertension Mother   . Hypertension Father   . Cancer Mother     Lung Cancer - long history of tobacco use    Allergies  Allergen Reactions  . Adhesive (Tape) Rash    Current Outpatient Prescriptions on File Prior to Visit  Medication Sig Dispense Refill  . acyclovir ointment (ZOVIRAX) 5 % Apply topically every 3  (three) hours.  15 g  12  . budesonide (RHINOCORT AQUA) 32 MCG/ACT nasal spray Place 2 sprays into the nose daily. As needed      . calcium carbonate (OS-CAL) 600 MG TABS Take 600 mg by mouth 2 (two) times daily with a meal.      . Cholecalciferol (VITAMIN D3) 2000 UNITS capsule Take 2,000 Units by mouth daily.        Marland Kitchen loratadine (CLARITIN) 10 MG tablet Take 10 mg by mouth daily as needed.      Marland Kitchen losartan (COZAAR) 25 MG tablet Take 1 tablet (25 mg total) by mouth daily.  90 tablet  3  . Na Sulfate-K Sulfate-Mg Sulf (SUPREP BOWEL PREP) SOLN Take 1 kit by mouth once.  1 Bottle  0  . tamoxifen (NOLVADEX) 20 MG tablet Take 20 mg by mouth daily.          BP 120/78  Pulse 74  Temp 98.2 F (36.8 C) (Oral)  Wt 150 lb (68.04 kg)  SpO2 97%      Objective:   Physical Exam  Constitutional: She appears well-developed and well-nourished.  HENT:  Head: Normocephalic and atraumatic.  Cardiovascular: Normal rate and regular rhythm.   Pulmonary/Chest: Effort normal and breath sounds normal. She has no wheezes.  Skin: Skin is warm and dry. No  rash noted.       No suspicious skin lesions  Psychiatric: She has a normal mood and affect. Her behavior is normal.        Assessment & Plan:

## 2012-01-18 LAB — VITAMIN D 25 HYDROXY (VIT D DEFICIENCY, FRACTURES): Vit D, 25-Hydroxy: 57 ng/mL (ref 30–89)

## 2012-02-05 ENCOUNTER — Encounter: Payer: BC Managed Care – PPO | Admitting: Internal Medicine

## 2012-02-05 ENCOUNTER — Other Ambulatory Visit: Payer: BC Managed Care – PPO

## 2012-02-05 ENCOUNTER — Other Ambulatory Visit: Payer: BC Managed Care – PPO | Admitting: Lab

## 2012-02-13 ENCOUNTER — Ambulatory Visit: Payer: BC Managed Care – PPO | Admitting: Oncology

## 2012-02-13 ENCOUNTER — Ambulatory Visit (HOSPITAL_BASED_OUTPATIENT_CLINIC_OR_DEPARTMENT_OTHER): Payer: BC Managed Care – PPO | Admitting: Oncology

## 2012-02-13 ENCOUNTER — Telehealth: Payer: Self-pay | Admitting: Oncology

## 2012-02-13 VITALS — BP 132/76 | HR 96 | Temp 98.1°F | Resp 20 | Ht 65.0 in | Wt 151.1 lb

## 2012-02-13 DIAGNOSIS — C50519 Malignant neoplasm of lower-outer quadrant of unspecified female breast: Secondary | ICD-10-CM

## 2012-02-13 DIAGNOSIS — C50919 Malignant neoplasm of unspecified site of unspecified female breast: Secondary | ICD-10-CM

## 2012-02-13 DIAGNOSIS — M899 Disorder of bone, unspecified: Secondary | ICD-10-CM

## 2012-02-13 DIAGNOSIS — Z170 Estrogen receptor positive status [ER+]: Secondary | ICD-10-CM

## 2012-02-13 MED ORDER — TAMOXIFEN CITRATE 20 MG PO TABS: 20.0000 mg | ORAL_TABLET | Freq: Every day | ORAL | Status: DC

## 2012-02-13 NOTE — Telephone Encounter (Signed)
gve the pt her April-may 2014 appt calendar along with the PPT TO SEE DR Danella Deis IN MAY 2014.

## 2012-02-13 NOTE — Progress Notes (Signed)
Erin [name]  MR#: [id number]    PCP: Thomos Lemons, DO   History of present illness: Erin [name] had routine screening mammography December of 2009 at the [facility] in [place], [place], showing an increasing asymmetry in her left breast. She was recalled for additional views December 29. She was noted to have heterogeneously dense breasts. It was a 5 cm area of architectural distortion in the lateral aspect of the left breast with no associated calcifications. There was a second lesion medial to this. Ultrasound showed a highly suspicious hypoechoic irregularly marginated mass measuring 3 cm at the 2:30 position 5 cm from the nipple. This was palpable to the mammographer. The second area in question I measured 7 mm and a third lesion was noted measuring 5 mm. Some left axillary lymph nodes were morphologically normal.  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 Bilateral breast MRIs were performed 05/04/2008 and showed, in the left breast, a large lobulated mass measuring up to 4.3 cm, and including both of the apparently separate masses the previously biopsied. In the right breast there were 2 indeterminate lesions. These were evaluated further with breast specific gamma imaging performed January 14 in both lesions were negative. The lesion in the left breast was markedly abnormal.  Given this complex history and with the background of significant breast density, the patient opted for bilateral mastectomies with left sentinel lymph node sampling. This was performed of 06/27/2008 and showed(S. [id number]) in the right breast, no malignancy. In the left breast there was a 3.2 cm invasive ductal carcinoma, grade 1, focally extending to the anterior margin of the lower outer quadrant. There was extensive angiolymphatic invasion, but both sentinel  lymph nodes on the left were negative.  The patient received left-sided postmastectomy radiation including of the left chest wall and left supraclavicular fossa to a total dose of 50.4 Gy plus a 10 Gy scar boost. She had an Oncotype DX recurrence score of 17, further discussed below. She decided to forego reconstruction. She was tested for BRCA1 and 2 and was found to be negative. Given her overall prognosis, she did not receive radiation, but started tamoxifen February of 2010, with good tolerance. Her subsequent history is as detailed below.  Interval history: The patient returns for followup of her breast cancer. Interval history significant for her mother having died from complications of lung cancer. Erin [name] continues to work at [employer] likes her job, and is exercising regularly mostly on an elliptical  Review of systems:   She bruises easily and has hot flashes but mostly she had many questions regarding vitamin D, osteopenia, tamoxifen, and some skin lesions. 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  Past medical history:      Past Medical History  Diagnosis Date  . Breast cancer   . Hypertension   . Allergic rhinitis due to pollen     Past surgical history:      Past Surgical History  Procedure Date  . Mastectomy     bilateral  . Tonsillectomy   . Right knee arthroscopy 09-06-2006    Family history:  The patient's mother died in 2011-09-06 from complications of lung cancer. The patient has not been in touch with her father her for approximately 30 years. She had  no sisters, 1 brother, who is in good health. There is no breast or ovarian cancer in the family to her knowledge.  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 She lives alone and has no pets.    ADVANCED DIRECTIVES: not in place  Health  maintenance:       History  Substance Use Topics  . Smoking status: Never Smoker   . Smokeless tobacco: Not on file  . Alcohol Use: Yes      Colonoscopy: n/a  PAP: Feb 2011  Bone density: 06/2009, nl  Cholesterol: "good"    Allergies:     Allergies  Allergen Reactions  . Adhesive (Tape) Rash    Medications:      Current Outpatient Prescriptions  Medication Sig Dispense Refill  . calcium carbonate (OS-CAL) 600 MG TABS Take 600 mg by mouth 2 (two) times daily with a meal.      . Cholecalciferol (VITAMIN D3) 2000 UNITS capsule Take 2,000 Units by mouth daily.        Marland Kitchen losartan (COZAAR) 25 MG tablet Take 1 tablet (25 mg total) by mouth daily.  90 tablet  3  . tamoxifen (NOLVADEX) 20 MG tablet Take 20 mg by mouth daily.        Marland Kitchen acyclovir ointment (ZOVIRAX) 5 % Apply topically every 3 (three) hours.  15 g  12  . budesonide (RHINOCORT AQUA) 32 MCG/ACT nasal spray Place 2 sprays into the nose daily. As needed      . loratadine (CLARITIN) 10 MG tablet Take 10 mg by mouth daily as needed.      . Na Sulfate-K Sulfate-Mg Sulf (SUPREP BOWEL PREP) SOLN Take 1 kit by mouth once.  1 Bottle  0    Physical exam:  Middle-aged white woman who appears fit    Filed Vitals:   02/13/12 1400  BP: 132/76  Pulse: 96  Temp: 98.1 F (36.7 C)  Resp: 20     Body mass index is 25.14 kg/(m^2).   ECOG performance status: 0 Oropharynx is clear  There is no peripheral adenopathy  Lungs show no crackles or wheezes  Heart regular rate and rhythm with no murmurs appreciated  Abdomen soft nontender positive bowel sounds  Musculoskeletal exam shows no focal spinal tenderness, no peripheral edema  Neurologic exam is nonfocal  Breast exam shows bilateral mastectomies. There is no evidence of local recurrence.  Lab results:            Chemistry      Component Value Date/Time   NA 135 01/17/2012 1539   K 4.2 01/17/2012 1539   CL 103 01/17/2012 1539   CO2 26 01/17/2012 1539   BUN 17 01/17/2012  1539   CREATININE 0.8 01/17/2012 1539      Component Value Date/Time   CALCIUM 8.8 01/17/2012 1539   ALKPHOS 43 01/17/2012 1539   AST 18 01/17/2012 1539   ALT 13 01/17/2012 1539   BILITOT 0.7 01/17/2012 1539         Lab Results  Component Value Date   WBC 6.3 01/17/2012   HGB 13.5 01/17/2012   HCT 40.8 01/17/2012   MCV 90.8 01/17/2012   PLT 209.0 01/17/2012   NEUTROABS 4.3 01/17/2012    Studies:    Dexa scan 07/10/2009 normal; repeat DEXA scan April 2013 shows minimal osteopenia (T score -1.2)  Assessment: 51 year old BRCA 1/2 negative  [place] woman ,  (1) status post bilateral mastectomies January 2010 for a 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 As expected. She is doing quite well with very minimal osteopenia. Dr.Yoo has started her on standard calcium/vitamin D supplementation, and she wonders if she needs to take additional vitamin D. A recent review of showed fairly convincingly that vitamin D above a normal level it has no effect whatsoever on osteoporosis. [facility] her vitamin D level is greater than 30 she's fine. Currently her vitamin D level is almost 60. Accordingly am very comfortable with her of stopping her multivitamin, she wants to continue however the additional 2000 mg supplement together with the thousand that she gets with her 2 calcium tablets. That is all perfectly reasonable  I cannot find any evidence that that tamoxifen affects retinal nerve fiber density. I think she may want to discuss this with Dr. Charlotte Sanes. I saw no suspicious skin lesions the day but she does not have a  dermatologist and we are scheduling her to see Dr. Danella Deis for prophylaxis. Otherwise the chart will return to see me in 6 months. She knows to call for any problems that may develop before that visit Bryony Kaman C 02/13/2012

## 2012-03-26 ENCOUNTER — Encounter: Payer: Self-pay | Admitting: Internal Medicine

## 2012-03-26 ENCOUNTER — Ambulatory Visit (AMBULATORY_SURGERY_CENTER): Payer: BC Managed Care – PPO | Admitting: *Deleted

## 2012-03-26 VITALS — Ht 65.0 in | Wt 153.6 lb

## 2012-03-26 DIAGNOSIS — Z1211 Encounter for screening for malignant neoplasm of colon: Secondary | ICD-10-CM

## 2012-04-06 ENCOUNTER — Encounter: Payer: Self-pay | Admitting: Internal Medicine

## 2012-04-06 ENCOUNTER — Ambulatory Visit (AMBULATORY_SURGERY_CENTER): Payer: BC Managed Care – PPO | Admitting: Internal Medicine

## 2012-04-06 VITALS — BP 121/68 | HR 76 | Temp 98.5°F | Resp 12 | Ht 65.0 in | Wt 153.0 lb

## 2012-04-06 DIAGNOSIS — Z1211 Encounter for screening for malignant neoplasm of colon: Secondary | ICD-10-CM

## 2012-04-06 MED ORDER — SODIUM CHLORIDE 0.9 % IV SOLN: 500.0000 mL | INTRAVENOUS | Status: DC

## 2012-04-06 NOTE — Progress Notes (Signed)
Patient did not experience any of the following events: a burn prior to discharge; a fall within the facility; wrong site/side/patient/procedure/implant event; or a hospital transfer or hospital admission upon discharge from the facility. (G8907) Patient did not have preoperative order for IV antibiotic SSI prophylaxis. (G8918)  

## 2012-04-06 NOTE — Patient Instructions (Addendum)
Diverticulosis seen, try to follow high fiber diet. See handouts given. Continue current medications. Repeat colonoscopy in 5 or 10 years. Call us with any questions or concerns. Thank you!!  YOU HAD AN ENDOSCOPIC PROCEDURE TODAY AT THE [facility]: Refer to the procedure report that was given to you for any specific questions about what was found during the examination.  If the procedure report does not answer your questions, please call your gastroenterologist to clarify.  If you requested that your care partner not be given the details of your procedure findings, then the procedure report has been included in a sealed envelope for you to review at your convenience later.  YOU SHOULD EXPECT: Some feelings of bloating in the abdomen. Passage of more gas than usual.  Walking can help get rid of the air that was put into your GI tract during the procedure and reduce the bloating. If you had a lower endoscopy (such as a colonoscopy or flexible sigmoidoscopy) you may notice spotting of blood in your stool or on the toilet paper. If you underwent a bowel prep for your procedure, then you may not have a normal bowel movement for a few days.  DIET: Your first meal following the procedure should be a light meal and then it is ok to progress to your normal diet.  A half-sandwich or bowl of soup is an example of a good first meal.  Heavy or fried foods are harder to digest and may make you feel nauseous or bloated.  Likewise meals heavy in dairy and vegetables can cause extra gas to form and this can also increase the bloating.  Drink plenty of fluids but you should avoid alcoholic beverages for 24 hours.  ACTIVITY: Your care partner should take you home directly after the procedure.  You should plan to take it easy, moving slowly for the rest of the day.  You can resume normal activity the day after the procedure however you should NOT DRIVE or use heavy machinery for 24 hours (because of the sedation  medicines used during the test).    SYMPTOMS TO REPORT IMMEDIATELY: A gastroenterologist can be reached at any hour.  During normal business hours, 8:30 AM to 5:00 PM Monday through Friday, call [phone].  After hours and on weekends, please call the GI answering service at [phone] who will take a message and have the physician on call contact you.   Following lower endoscopy (colonoscopy or flexible sigmoidoscopy):  Excessive amounts of blood in the stool  Significant tenderness or worsening of abdominal pains  Swelling of the abdomen that is new, acute  Fever of 100F or higher  FOLLOW UP: If any biopsies were taken you will be contacted by phone or by letter within the next 1-3 weeks.  Call your gastroenterologist if you have not heard about the biopsies in 3 weeks.  Our staff will call the home number listed on your records the next business day following your procedure to check on you and address any questions or concerns that you may have at that time regarding the information given to you following your procedure. This is a courtesy call and so if there is no answer at the home number and we have not heard from you through the emergency physician on call, we will assume that you have returned to your regular daily activities without incident.  SIGNATURES/CONFIDENTIALITY: You and/or your care partner have signed paperwork which will be entered into your electronic medical record.  These signatures attest to the fact that that the information above on your After Visit Summary has been reviewed and is understood.  Full responsibility of the confidentiality of this discharge information lies with you and/or your care-partner.  

## 2012-04-06 NOTE — Op Note (Signed)
[facility] [address] [place], [address]   COLONOSCOPY PROCEDURE REPORT  PATIENT: Gisele, Pack  MR#: [id number] BIRTHDATE: 16-Mar-1961 , 51  yrs. old GENDER: Female ENDOSCOPIST: Beverley Fiedler, MD REFERRED BY:Yoo, Robert PROCEDURE DATE:  04/06/2012 PROCEDURE:   Colonoscopy, screening ASA CLASS:   Class II INDICATIONS:average risk screening and first colonoscopy. MEDICATIONS: MAC sedation, administered by CRNA and Propofol (Diprivan) 280 mg IV  DESCRIPTION OF PROCEDURE:   After the risks benefits and alternatives of the procedure were thoroughly explained, informed consent was obtained.  A digital rectal exam revealed no rectal mass.   The LB CF-[id number]  endoscope was introduced through the anus and advanced to the cecum, which was identified by both the appendix and ileocecal valve. No adverse events experienced. The quality of the prep was Suprep fair requiring copious irrigation and lavage with adequate views thereafter.  The instrument was then slowly withdrawn as the colon was fully examined.    COLON FINDINGS: There was mild scattered diverticulosis noted in the ascending colon and sigmoid colon.   The colon mucosa was otherwise normal.  Fair prep.  Retroflexed views revealed no abnormalities. The time to cecum=5 minutes 02 seconds.  Withdrawal time=14 minutes 12 seconds.  The scope was withdrawn and the procedure completed.  COMPLICATIONS: There were no complications.  ENDOSCOPIC IMPRESSION: 1.   There was mild diverticulosis noted in the ascending colon and sigmoid colon 2.   The colon mucosa was otherwise normal  RECOMMENDATIONS: 1.  Continue current medications 2.  Repeat colonoscopy interval based on current guidelines would be 10 years, unless there is a change in family history.  However, fair preparation lowers the sensitivity of colonoscopy for the detection of colon polyps, therefore repeat could be considered in 5  years   eSigned:  Beverley Fiedler, MD 04/06/2012 9:31 AM   cc: Thomos Lemons, DO and The Patient

## 2012-04-07 ENCOUNTER — Telehealth: Payer: Self-pay

## 2012-04-07 NOTE — Telephone Encounter (Signed)
  Follow up Call-  Call back number 04/06/2012  Post procedure Call Back phone  # [phone]  Permission to leave phone message Yes     Patient questions:  Do you have a fever, pain , or abdominal swelling? no Pain Score  0 *  Have you tolerated food without any problems? yes  Have you been able to return to your normal activities? yes  Do you have any questions about your discharge instructions: Diet   no Medications  no Follow up visit  no  Do you have questions or concerns about your Care? no  Actions: * If pain score is 4 or above: No action needed, pain <4.  No problems per the pt. Maw

## 2012-07-15 ENCOUNTER — Telehealth: Payer: Self-pay | Admitting: *Deleted

## 2012-07-15 NOTE — Telephone Encounter (Signed)
Patient called and moved her lab appt to an earlier day.    JMW

## 2012-08-10 ENCOUNTER — Other Ambulatory Visit (HOSPITAL_BASED_OUTPATIENT_CLINIC_OR_DEPARTMENT_OTHER): Payer: BC Managed Care – PPO | Admitting: Lab

## 2012-08-10 DIAGNOSIS — C50519 Malignant neoplasm of lower-outer quadrant of unspecified female breast: Secondary | ICD-10-CM

## 2012-08-10 DIAGNOSIS — C50919 Malignant neoplasm of unspecified site of unspecified female breast: Secondary | ICD-10-CM

## 2012-08-10 LAB — COMPREHENSIVE METABOLIC PANEL (CC13)
ALT: 8 U/L (ref 0–55)
AST: 15 U/L (ref 5–34)
Alkaline Phosphatase: 48 U/L (ref 40–150)
CO2: 22 mEq/L (ref 22–29)
Sodium: 138 mEq/L (ref 136–145)
Total Bilirubin: 0.8 mg/dL (ref 0.20–1.20)
Total Protein: 6.6 g/dL (ref 6.4–8.3)

## 2012-08-10 LAB — CBC WITH DIFFERENTIAL/PLATELET
BASO%: 0.5 % (ref 0.0–2.0)
LYMPH%: 24.9 % (ref 14.0–49.7)
MCHC: 34.7 g/dL (ref 31.5–36.0)
MONO#: 0.5 10*3/uL (ref 0.1–0.9)
MONO%: 8.1 % (ref 0.0–14.0)
Platelets: 185 10*3/uL (ref 145–400)
RBC: 4.87 10*6/uL (ref 3.70–5.45)
WBC: 6.5 10*3/uL (ref 3.9–10.3)

## 2012-08-13 ENCOUNTER — Other Ambulatory Visit: Payer: BC Managed Care – PPO | Admitting: Lab

## 2012-08-20 ENCOUNTER — Ambulatory Visit (HOSPITAL_BASED_OUTPATIENT_CLINIC_OR_DEPARTMENT_OTHER): Payer: BC Managed Care – PPO | Admitting: Oncology

## 2012-08-20 ENCOUNTER — Telehealth: Payer: Self-pay | Admitting: Oncology

## 2012-08-20 VITALS — BP 145/88 | HR 91 | Temp 98.2°F | Resp 20 | Ht 65.0 in | Wt 154.8 lb

## 2012-08-20 DIAGNOSIS — C50912 Malignant neoplasm of unspecified site of left female breast: Secondary | ICD-10-CM

## 2012-08-20 DIAGNOSIS — C50919 Malignant neoplasm of unspecified site of unspecified female breast: Secondary | ICD-10-CM

## 2012-08-20 NOTE — Progress Notes (Signed)
Erin [name]  MR#: [id number]    PCP: Thomos Lemons, DO GYN: Marcie Bal SU: OTHER MD: Campbell Stall, Vonna Kotyk Pyrtle   History of present illness: Ms. Erin [name] had routine screening mammography December of 2009 at the [facility] in [place], [place], showing an increasing asymmetry in her left breast. She was recalled for additional views December 29. She was noted to have heterogeneously dense breasts. It was a 5 cm area of architectural distortion in the lateral aspect of the left breast with no associated calcifications. There was a second lesion medial to this. Ultrasound showed a highly suspicious hypoechoic irregularly marginated mass measuring 3 cm at the 2:30 position 5 cm from the nipple. This was palpable to the mammographer. The second area in question I measured 7 mm and a third lesion was noted measuring 5 mm. Some left axillary lymph nodes were morphologically normal.  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 Bilateral breast MRIs were performed 05/04/2008 and showed, in the left breast, a large lobulated mass measuring up to 4.3 cm, and including both of the apparently separate masses the previously biopsied. In the right breast there were 2 indeterminate lesions. These were evaluated further with breast specific gamma imaging performed January 14 in both lesions were negative. The lesion in the left breast was markedly abnormal.  Given this complex history and with the background of significant breast density, the patient opted for bilateral mastectomies with left sentinel lymph node sampling. This was performed of 06/27/2008 and showed(S. [id number]) in the right breast, no malignancy. In the left breast there was a 3.2 cm invasive ductal carcinoma, grade 1, focally extending to the anterior margin of the lower outer quadrant.  There was extensive angiolymphatic invasion, but both sentinel lymph nodes on the left were negative.  The patient received left-sided postmastectomy radiation including of the left chest wall and left supraclavicular fossa to a total dose of 50.4 Gy plus a 10 Gy scar boost. She had an Oncotype DX recurrence score of 17, further discussed below. She decided to forego reconstruction. She was tested for BRCA1 and 2 and was found to be negative. Given her overall prognosis, she did not receive radiation, but started tamoxifen February of 2010, with good tolerance. Her subsequent history is as detailed below.  Interval history: [name] returns for followup of her breast cancer. Interval history is stable. 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 She is exercising at the gym and also add her elliptical at home.   Review of systems:   She is tolerating the tamoxifen "fine", although she does have hot flashes from this, which she describes as "not bad". She developed a new skin change associated with the left mastectomy scar which she wanted me to see. This was also evaluated by her dermatologist, Dr. Danella Deis. She has some allergy symptoms. Otherwise a detailed review of systems today was noncontributory.  Past medical history:      Past Medical History  Diagnosis Date  . Breast cancer 2010  . Hypertension   . Allergic rhinitis due to pollen   . Osteopenia   Diverticulosis  Past surgical history:      Past Surgical History  Procedure Laterality Date  . Mastectomy  2010    bilateral  .  Tonsillectomy    . Right knee arthroscopy  2006/09/25  . Inguinal hernia repair      right side as infant    Family history:  The patient's mother died in 09/25/2011 from complications of lung cancer. The patient has not been in touch with her father her for approximately 30 years. She had no sisters, 1 brother, who is in  good health. There is no breast or ovarian cancer in the family to her knowledge.  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 She lives alone and has no pets.    ADVANCED DIRECTIVES: not in place  Health maintenance:       History  Substance Use Topics  . Smoking status: Never Smoker   . Smokeless tobacco: Never Used  . Alcohol Use: Yes     Comment: rare      Colonoscopy: December 2013  PAP: Feb 2011  Bone density: 06/2009, nl  Cholesterol: "good"    Allergies:     Allergies  Allergen Reactions  . Adhesive (Tape) Rash    Medications:      Current Outpatient Prescriptions  Medication Sig Dispense Refill  . acyclovir ointment (ZOVIRAX) 5 % Apply topically every 3 (three) hours.  15 g  12  . budesonide (RHINOCORT AQUA) 32 MCG/ACT nasal spray Place 2 sprays into the nose daily. As needed      . calcium carbonate (OS-CAL) 600 MG TABS Take 600 mg by mouth 2 (two) times daily with a meal.      . Cholecalciferol (VITAMIN D3) 2000 UNITS capsule Take 2,000 Units by mouth daily.        Marland Kitchen loratadine (CLARITIN) 10 MG tablet Take 10 mg by mouth daily as needed.      Marland Kitchen losartan (COZAAR) 25 MG tablet Take 1 tablet (25 mg total) by mouth daily.  90 tablet  3  . tamoxifen (NOLVADEX) 20 MG tablet Take 1 tablet (20 mg total) by mouth daily.  90 tablet  12   No current facility-administered medications for this visit.    Physical exam:  Middle-aged white woman who appears well    Filed Vitals:   08/20/12 1435  BP: 145/88  Pulse: 91  Temp: 98.2 F (36.8 C)  Resp: 20     Body mass index is 25.76 kg/(m^2).   ECOG performance status: 0 Sclerae unicteric Oropharynx clear No cervical or supraclavicular adenopathy Lungs no rales or rhonchi Heart regular rate and rhythm Abd benign MSK no focal spinal tenderness, no  peripheral edema Neuro: nonfocal, well oriented, appropriate affect Breasts: Status post bilateral mastectomies. On the left she has some telangiectasias secondary to the radiation. In the inferior/lateral aspect of the scar there is a very small fold of skin which is soft and not pigmented. This appears to be a slight fold of fat. The left axilla is benign.   Lab results:            Chemistry      Component Value Date/Time   NA 138 08/10/2012 0944   NA 135 01/17/2012 1539   K 4.2 08/10/2012 0944   K 4.2 01/17/2012 1539   CL 105 08/10/2012 0944   CL 103 01/17/2012 1539   CO2 22 08/10/2012 0944   CO2 26 01/17/2012 1539   BUN 15.2 08/10/2012 0944   BUN  17 01/17/2012 1539   CREATININE 0.9 08/10/2012 0944   CREATININE 0.8 01/17/2012 1539      Component Value Date/Time   CALCIUM 9.0 08/10/2012 0944   CALCIUM 8.8 01/17/2012 1539   ALKPHOS 48 08/10/2012 0944   ALKPHOS 43 01/17/2012 1539   AST 15 08/10/2012 0944   AST 18 01/17/2012 1539   ALT 8 08/10/2012 0944   ALT 13 01/17/2012 1539   BILITOT 0.80 08/10/2012 0944   BILITOT 0.7 01/17/2012 1539         Lab Results  Component Value Date   WBC 6.5 08/10/2012   HGB 14.6 08/10/2012   HCT 42.1 08/10/2012   MCV 86.4 08/10/2012   PLT 185 08/10/2012   NEUTROABS 4.3 08/10/2012    Studies:    Dexa scan 07/10/2009 normal; repeat DEXA scan April 2013 shows minimal osteopenia (T score -1.2)  Assessment: 52 y.o.  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 The plan is to continue tamoxifen until full menopause, at which time we can consider switching to an aromatase inhibitor. Alternatively she can continue tamoxifen for a total of 10 years. She knows to call for any problems that may develop before the next visit.  Shamarcus Hoheisel C 08/20/2012

## 2012-09-21 ENCOUNTER — Telehealth: Payer: Self-pay | Admitting: Internal Medicine

## 2012-09-21 MED ORDER — LOSARTAN POTASSIUM 25 MG PO TABS: 25.0000 mg | ORAL_TABLET | Freq: Every day | ORAL | Status: DC

## 2012-09-21 NOTE — Telephone Encounter (Signed)
rx sent in electronically 

## 2012-09-21 NOTE — Telephone Encounter (Signed)
Pt needs refill of losartan (COZAAR) 25 MG tablet.  Pharm: [facility]/ [address]

## 2013-01-07 ENCOUNTER — Other Ambulatory Visit (INDEPENDENT_AMBULATORY_CARE_PROVIDER_SITE_OTHER): Payer: BC Managed Care – PPO

## 2013-01-07 DIAGNOSIS — Z0000 Encounter for general adult medical examination without abnormal findings: Secondary | ICD-10-CM

## 2013-01-07 LAB — HEPATIC FUNCTION PANEL
AST: 19 U/L (ref 0–37)
Albumin: 3.6 g/dL (ref 3.5–5.2)
Alkaline Phosphatase: 43 U/L (ref 39–117)
Total Protein: 6.6 g/dL (ref 6.0–8.3)

## 2013-01-07 LAB — POCT URINALYSIS DIPSTICK
Protein, UA: NEGATIVE
Spec Grav, UA: 1.015
Urobilinogen, UA: 0.2
pH, UA: 6

## 2013-01-07 LAB — CBC WITH DIFFERENTIAL/PLATELET
Eosinophils Relative: 1.2 % (ref 0.0–5.0)
HCT: 39.8 % (ref 36.0–46.0)
Hemoglobin: 13.6 g/dL (ref 12.0–15.0)
Lymphocytes Relative: 24.3 % (ref 12.0–46.0)
Lymphs Abs: 1.5 10*3/uL (ref 0.7–4.0)
Monocytes Relative: 8.4 % (ref 3.0–12.0)
Neutro Abs: 4 10*3/uL (ref 1.4–7.7)
RBC: 4.55 Mil/uL (ref 3.87–5.11)
WBC: 6.1 10*3/uL (ref 4.5–10.5)

## 2013-01-07 LAB — LIPID PANEL
HDL: 55.7 mg/dL (ref >39.00)
LDL Cholesterol: 61 mg/dL (ref 0–99)
VLDL: 18.4 mg/dL (ref 0.0–40.0)

## 2013-01-07 LAB — BASIC METABOLIC PANEL
GFR: 76.99 mL/min (ref >60.00)
Potassium: 4.1 mEq/L (ref 3.5–5.1)
Sodium: 135 mEq/L (ref 135–145)

## 2013-01-07 LAB — TSH: TSH: 15.86 u[IU]/mL — ABNORMAL HIGH (ref 0.35–5.50)

## 2013-01-15 ENCOUNTER — Encounter: Payer: Self-pay | Admitting: Internal Medicine

## 2013-01-15 ENCOUNTER — Ambulatory Visit (INDEPENDENT_AMBULATORY_CARE_PROVIDER_SITE_OTHER): Payer: BC Managed Care – PPO | Admitting: Internal Medicine

## 2013-01-15 VITALS — BP 142/82 | HR 68 | Temp 98.5°F | Ht 65.0 in | Wt 157.0 lb

## 2013-01-15 DIAGNOSIS — I10 Essential (primary) hypertension: Secondary | ICD-10-CM

## 2013-01-15 DIAGNOSIS — R6889 Other general symptoms and signs: Secondary | ICD-10-CM

## 2013-01-15 DIAGNOSIS — W57XXXA Bitten or stung by nonvenomous insect and other nonvenomous arthropods, initial encounter: Secondary | ICD-10-CM

## 2013-01-15 DIAGNOSIS — R7989 Other specified abnormal findings of blood chemistry: Secondary | ICD-10-CM

## 2013-01-15 DIAGNOSIS — Z23 Encounter for immunization: Secondary | ICD-10-CM

## 2013-01-15 DIAGNOSIS — T148 Other injury of unspecified body region: Secondary | ICD-10-CM

## 2013-01-15 DIAGNOSIS — Z0000 Encounter for general adult medical examination without abnormal findings: Secondary | ICD-10-CM

## 2013-01-15 DIAGNOSIS — E039 Hypothyroidism, unspecified: Secondary | ICD-10-CM | POA: Insufficient documentation

## 2013-01-15 LAB — T4, FREE: Free T4: 0.95 ng/dL (ref 0.60–1.60)

## 2013-01-15 NOTE — Assessment & Plan Note (Signed)
Reviewed adult health maintenance protocols.  Patient updated with influenza vaccine. She completed her colonoscopy.

## 2013-01-15 NOTE — Progress Notes (Signed)
Subjective:    Patient ID: Erin [name], female    DOB: 09-28-1960, 52 y.o.   MRN: [id number]  HPI  52 year old white female with history of breast cancer and hypertension for routine physical. She denies any significant interval medical history. Patient followed by her oncologist.  We reviewed her screening labs. She has elevated TSH. She denies any symptoms of hypothyroidism. She denies any family history of thyroid disorder.  Her lipid panel is within acceptable range.  Review of Systems  Constitutional: Negative for activity change, appetite change and unexpected weight change.  Eyes: Negative for visual disturbance.  Respiratory: Negative for cough, chest tightness and shortness of breath.   Cardiovascular: Negative for chest pain.  Genitourinary: Negative for difficulty urinating.  Neurological: Negative for headaches.  Gastrointestinal: Negative for abdominal pain, heartburn melena or hematochezia Psych: Negative for depression or anxiety Endo:  Negative for cold intolerance, fatigue or memory loss    Past Medical History  Diagnosis Date  . Breast cancer 2010  . Hypertension   . Allergic rhinitis due to pollen   . Osteopenia     History   Social History  . Marital Status: Single    Spouse Name: N/A    Number of Children: N/A  . Years of Education: N/A   Occupational History  . Not on file.   Social History Main Topics  . Smoking status: Never Smoker   . Smokeless tobacco: Never Used  . Alcohol Use: Yes     Comment: rare  . Drug Use: No  . Sexual Activity: Not on file   Other Topics Concern  . Not on file   Social History Narrative   She grew up in [place]   Spent last 16 yrs in [place]    Past Surgical History  Procedure Laterality Date  . Mastectomy  2010    bilateral  . Tonsillectomy    . Right knee arthroscopy  2008  . Inguinal hernia repair      right side as infant    Family History  Problem Relation Age of Onset  . Hypertension  Mother   . Cancer Mother     Lung Cancer - long history of tobacco use  . Hypertension Father   . Colon cancer Neg Hx   . Stomach cancer Neg Hx     Allergies  Allergen Reactions  . Adhesive [Tape] Rash    Current Outpatient Prescriptions on File Prior to Visit  Medication Sig Dispense Refill  . acyclovir ointment (ZOVIRAX) 5 % Apply topically every 3 (three) hours.  15 g  12  . budesonide (RHINOCORT AQUA) 32 MCG/ACT nasal spray Place 2 sprays into the nose daily. As needed      . calcium carbonate (OS-CAL) 600 MG TABS Take 600 mg by mouth 2 (two) times daily with a meal.      . Cholecalciferol (VITAMIN D3) 2000 UNITS capsule Take 2,000 Units by mouth daily.        Marland Kitchen loratadine (CLARITIN) 10 MG tablet Take 10 mg by mouth daily as needed.      Marland Kitchen losartan (COZAAR) 25 MG tablet Take 1 tablet (25 mg total) by mouth daily.  90 tablet  0  . tamoxifen (NOLVADEX) 20 MG tablet Take 1 tablet (20 mg total) by mouth daily.  90 tablet  12   No current facility-administered medications on file prior to visit.    BP 142/82  Pulse 68  Temp(Src) 98.5 F (36.9 C) (Oral)  Ht 5\' 5"  (  1.651 m)  Wt 157 lb (71.215 kg)  BMI 26.13 kg/m2    Objective:   Physical Exam  Constitutional: She is oriented to person, place, and time. She appears well-developed and well-nourished. No distress.  HENT:  Head: Normocephalic and atraumatic.  Right Ear: External ear normal.  Left Ear: External ear normal.  Mouth/Throat: Oropharynx is clear and moist.  Eyes: Conjunctivae and EOM are normal. Pupils are equal, round, and reactive to light.  Cardiovascular: Normal rate, regular rhythm and normal heart sounds.   No murmur heard. Pulmonary/Chest: Effort normal and breath sounds normal. She has no wheezes.  Abdominal: Soft. Bowel sounds are normal. There is no tenderness.  Neurological: She is alert and oriented to person, place, and time. No cranial nerve deficit.  Skin: Skin is warm and dry.  4-5 mm slight red  lesion right upper shoulder  Psychiatric: She has a normal mood and affect. Her behavior is normal.          Assessment & Plan:

## 2013-01-15 NOTE — Patient Instructions (Addendum)
Please complete the following lab tests before your next follow up appointment: TSH, Free T4 - abnormal TSH 796.4

## 2013-01-15 NOTE — Assessment & Plan Note (Signed)
Patient may have transient thyroiditis versus autoimmune hypothyroidism. Obtain free T4 and thyroid antibodies. Monitor thyroid studies for now. We may need thyroid uptake scan.

## 2013-01-15 NOTE — Assessment & Plan Note (Signed)
Stable.  No change in medication.  BP: 142/82 mmHg  Lab Results  Component Value Date   CREATININE 0.8 01/07/2013

## 2013-01-16 ENCOUNTER — Other Ambulatory Visit: Payer: Self-pay | Admitting: Internal Medicine

## 2013-01-18 LAB — LYME AB/WESTERN BLOT REFLEX: B burgdorferi Ab IgG+IgM: 0.41 {ISR}

## 2013-01-19 ENCOUNTER — Telehealth: Payer: Self-pay | Admitting: Internal Medicine

## 2013-01-19 NOTE — Telephone Encounter (Signed)
Pt would like results of labs done 9/26. pls call!!

## 2013-01-19 NOTE — Telephone Encounter (Signed)
See result note.  

## 2013-02-12 ENCOUNTER — Ambulatory Visit (INDEPENDENT_AMBULATORY_CARE_PROVIDER_SITE_OTHER): Payer: BC Managed Care – PPO | Admitting: Internal Medicine

## 2013-02-12 DIAGNOSIS — Z23 Encounter for immunization: Secondary | ICD-10-CM

## 2013-02-12 DIAGNOSIS — Z2911 Encounter for prophylactic immunotherapy for respiratory syncytial virus (RSV): Secondary | ICD-10-CM

## 2013-02-16 ENCOUNTER — Other Ambulatory Visit (HOSPITAL_BASED_OUTPATIENT_CLINIC_OR_DEPARTMENT_OTHER): Payer: BC Managed Care – PPO | Admitting: Lab

## 2013-02-16 DIAGNOSIS — C50919 Malignant neoplasm of unspecified site of unspecified female breast: Secondary | ICD-10-CM

## 2013-02-16 DIAGNOSIS — C50912 Malignant neoplasm of unspecified site of left female breast: Secondary | ICD-10-CM

## 2013-02-16 LAB — COMPREHENSIVE METABOLIC PANEL (CC13)
ALT: 10 U/L (ref 0–55)
AST: 16 U/L (ref 5–34)
Alkaline Phosphatase: 44 U/L (ref 40–150)
Anion Gap: 8 mEq/L (ref 3–11)
CO2: 24 mEq/L (ref 22–29)
Creatinine: 0.8 mg/dL (ref 0.6–1.1)
Glucose: 99 mg/dl (ref 70–140)
Sodium: 137 mEq/L (ref 136–145)
Total Bilirubin: 0.81 mg/dL (ref 0.20–1.20)
Total Protein: 6.4 g/dL (ref 6.4–8.3)

## 2013-02-16 LAB — CBC WITH DIFFERENTIAL/PLATELET
BASO%: 0.3 % (ref 0.0–2.0)
EOS%: 1.5 % (ref 0.0–7.0)
Eosinophils Absolute: 0.1 10*3/uL (ref 0.0–0.5)
LYMPH%: 27.1 % (ref 14.0–49.7)
MCHC: 34.6 g/dL (ref 31.5–36.0)
MONO#: 0.5 10*3/uL (ref 0.1–0.9)
Platelets: 194 10*3/uL (ref 145–400)
RBC: 4.42 10*6/uL (ref 3.70–5.45)
RDW: 12.3 % (ref 11.2–14.5)
WBC: 6.1 10*3/uL (ref 3.9–10.3)

## 2013-02-23 ENCOUNTER — Other Ambulatory Visit: Payer: Self-pay | Admitting: *Deleted

## 2013-02-23 ENCOUNTER — Ambulatory Visit (HOSPITAL_BASED_OUTPATIENT_CLINIC_OR_DEPARTMENT_OTHER): Payer: BC Managed Care – PPO | Admitting: Oncology

## 2013-02-23 ENCOUNTER — Telehealth: Payer: Self-pay | Admitting: *Deleted

## 2013-02-23 ENCOUNTER — Ambulatory Visit: Payer: BC Managed Care – PPO | Admitting: Oncology

## 2013-02-23 VITALS — BP 137/86 | HR 84 | Temp 98.2°F | Resp 20 | Ht 65.0 in | Wt 157.2 lb

## 2013-02-23 DIAGNOSIS — Z9010 Acquired absence of unspecified breast and nipple: Secondary | ICD-10-CM

## 2013-02-23 DIAGNOSIS — Z170 Estrogen receptor positive status [ER+]: Secondary | ICD-10-CM

## 2013-02-23 DIAGNOSIS — C50412 Malignant neoplasm of upper-outer quadrant of left female breast: Secondary | ICD-10-CM | POA: Insufficient documentation

## 2013-02-23 DIAGNOSIS — C50919 Malignant neoplasm of unspecified site of unspecified female breast: Secondary | ICD-10-CM

## 2013-02-23 MED ORDER — TAMOXIFEN CITRATE 20 MG PO TABS: 20.0000 mg | ORAL_TABLET | Freq: Every day | ORAL | Status: DC

## 2013-02-23 NOTE — Telephone Encounter (Signed)
appts made and printed...td 

## 2013-02-23 NOTE — Progress Notes (Signed)
Erin [name]  MR#: [id number]    PCP: Erin Lemons, DO GYN: Erin [name] SU: OTHER MD: Erin [name], Erin [name]   History of present illness: Erin [name] had routine screening mammography December of 2009 at the [facility] in [place], [place], showing an increasing asymmetry in her left breast. She was recalled for additional views December 29. She was noted to have heterogeneously dense breasts. It was a 5 cm area of architectural distortion in the lateral aspect of the left breast with no associated calcifications. There was a second lesion medial to this. Ultrasound showed a highly suspicious hypoechoic irregularly marginated mass measuring 3 cm at the 2:30 position 5 cm from the nipple. This was palpable to the mammographer. The second area in question I measured 7 mm and a third lesion was noted measuring 5 mm. Some left axillary lymph nodes were morphologically normal.  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 Bilateral breast MRIs were performed 05/04/2008 and showed, in the left breast, a large lobulated mass measuring up to 4.3 cm, and including both of the apparently separate masses the previously biopsied. In the right breast there were 2 indeterminate lesions. These were evaluated further with breast specific gamma imaging performed January 14 in both lesions were negative. The lesion in the left breast was markedly abnormal.  Given this complex history and with the background of significant breast density, the patient opted for bilateral mastectomies with left sentinel lymph node sampling. This was performed of 06/27/2008 and showed([id number]. [id number]) in the right breast, no malignancy. In the left breast there was a 3.2 cm invasive ductal carcinoma, grade 1, focally extending to the anterior margin of the lower outer quadrant.  There was extensive angiolymphatic invasion, but both sentinel lymph nodes on the left were negative.  The patient received left-sided postmastectomy radiation including of the left chest wall and left supraclavicular fossa to a total dose of 50.4 Gy plus a 10 Gy scar boost. She had an Oncotype DX recurrence score of 17, further discussed below. She decided to forego reconstruction. She was tested for BRCA1 and 2 and was found to be negative. Given her overall prognosis, she did not receive radiation, but started tamoxifen February of 2010, with good tolerance. Her subsequent history is as detailed below.  Interval history: Erin [name] returns for followup of her breast cancer. Interval history is unremarkable. Of course there has been some 4 [place] at [facility] recently but is only indirectly affects her. She is doing a lot of traveling for research, most recently to [place].  Review of systems:   She can have shooting pain around the incision and the breasts. This is not new and it is not more intense or frequent than before. She had an episode of back pain which resolved without intervention. She has a "growth" in the lateral aspect of the left foot, at the base of the fifth toe. She is wondering how to resolve that, although it seems to have improved with more attention to footwear. She tells me her thyroid is being adjusted by Dr. Dorna [name]. She keeps an exercise program including walking, elliptical, and weights at [facility] [employer] She is tolerating tamoxifen with no side effects other than mild hot flashes.  A detailed review of systems today was otherwise noncontributory   Past medical history:      Past Medical History  Diagnosis Date  . Breast cancer Sep 06, 2008  . Hypertension   . Allergic rhinitis due to pollen   . Osteopenia   Diverticulosis  Past surgical history:      Past Surgical History  Procedure Laterality Date  . Mastectomy  2010    bilateral  . Tonsillectomy    . Right knee arthroscopy  09/07/06   . Inguinal hernia repair      right side as infant    Family history:  The patient's mother died in 09/07/2011 from complications of lung cancer. The patient has not been in touch with her father her for approximately 30 years. She had no sisters, 1 brother, who is in good health. There is no breast or ovarian cancer in the family to her knowledge.  Gynecologic history: GX P0, menarche at around age 47, she still having periods, although they are slightly briefer and lighter since she started tamoxifen, but she still has a. Most months.  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 She lives alone and has no pets.    ADVANCED DIRECTIVES: not in place  Health maintenance:       History  Substance Use Topics  . Smoking status: Never Smoker   . Smokeless tobacco: Never Used  . Alcohol Use: Yes     Comment: rare      Colonoscopy: December 2013  PAP: Feb 2011  Bone density: 06/2009, nl  Cholesterol: "good"    Allergies:     Allergies  Allergen Reactions  . Adhesive [Tape] Rash    Medications:      Current Outpatient Prescriptions  Medication Sig Dispense Refill  . acyclovir ointment (ZOVIRAX) 5 % Apply topically every 3 (three) hours.  15 g  12  . budesonide (RHINOCORT AQUA) 32 MCG/ACT nasal spray Place 2 sprays into the nose daily. As needed      . calcium carbonate (OS-CAL) 600 MG TABS Take 600 mg by mouth 2 (two) times daily with a meal.      . Cholecalciferol (VITAMIN D3) 2000 UNITS capsule Take 2,000 Units by mouth daily.        Marland Kitchen loratadine (CLARITIN) 10 MG tablet Take 10 mg by mouth daily as needed.      Marland Kitchen losartan (COZAAR) 25 MG tablet TAKE 1 TABLET BY MOUTH DAILY  90 tablet  0  . tamoxifen (NOLVADEX) 20 MG tablet Take 1 tablet (20 mg total) by mouth daily.  90 tablet  12   No current facility-administered medications for this visit.    Physical exam:  Middle-aged white woman in no acute distress    Filed Vitals:   02/23/13 1555  BP: 137/86  Pulse: 84   Temp: 98.2 F (36.8 C)  Resp: 20     Body mass index is 26.16 kg/(m^2).   ECOG performance status: 0 Sclerae unicteric Oropharynx clear No cervical or supraclavicular adenopathy Lungs no rales or rhonchi Heart regular rate and rhythm Abd soft, nontender, positive bowel sounds MSK no focal spinal tenderness, no peripheral edema Neuro: nonfocal, well oriented, appropriate affect Breasts: Status post bilateral mastectomies. On the left she has some telangiectasias secondary to the radiation. The left axilla is benign.   Lab results:            Chemistry      Component Value Date/Time   NA 137 02/16/2013 1553   NA 135 01/07/2013 0911   K 4.1 02/16/2013 1553   K 4.1 01/07/2013 0911   CL 105  01/07/2013 0911   CL 105 08/10/2012 0944   CO2 24 02/16/2013 1553   CO2 26 01/07/2013 0911   BUN 15.2 02/16/2013 1553   BUN 17 01/07/2013 0911   CREATININE 0.8 02/16/2013 1553   CREATININE 0.8 01/07/2013 0911      Component Value Date/Time   CALCIUM 9.0 02/16/2013 1553   CALCIUM 8.7 01/07/2013 0911   ALKPHOS 44 02/16/2013 1553   ALKPHOS 43 01/07/2013 0911   AST 16 02/16/2013 1553   AST 19 01/07/2013 0911   ALT 10 02/16/2013 1553   ALT 12 01/07/2013 0911   BILITOT 0.81 02/16/2013 1553   BILITOT 0.7 01/07/2013 0911         Lab Results  Component Value Date   WBC 6.1 02/16/2013   HGB 13.0 02/16/2013   HCT 37.6 02/16/2013   MCV 85.1 02/16/2013   PLT 194 02/16/2013   NEUTROABS 3.9 02/16/2013    Studies:     DEXA scan April 2013 shows minimal osteopenia (T score -1.2)  Assessment: 52 y.o.  BRCA negative [place] woman ,  (1) status post bilateral mastectomies January 2010 for a left-sided T2 N0 (stage IIA) invasive ductal carcinoma, grade 1, strongly estrogen and progesterone receptor positive, HER-2 nonamplified,   (2) Oncotype DX recurrence score of 17, predicting a 10-12% distant recurrence risk over 10 years if her only adjuvant treatment is tamoxifen for 5 years  (3) status  post left postmastectomy radiation,   (4) on tamoxifen since February of 2010  Plan: Erin [name] is still menstruating fairly regularly, so we are not ready to switch to an aromatase inhibitor. She understands that continuing tamoxifen for 10 years provide similar benefits to switching to an aromatase inhibitor at some point. Accordingly there is no rush, and we can continue on the same pattern as before.  I suggested she contact Dr. Norlene Erin regarding the left foot problem. I am not restricting her in terms of exercise as far as using weights for upper extremity strength are concerned. We did discuss diet issues, as she is concerned about weight gain, and she her weight has an excellent exercise program, which would be complemented by decreasing routine carbohydrates.   I suggested that she could see me once a year. This is not what she had been told was the usual patternt. After much discussion she decided to "give it a try". If she would feel more comfortable seeing [place] every 6 months of course we will accommodate that. Marsalis Beaulieu C 02/23/2013

## 2013-03-04 ENCOUNTER — Other Ambulatory Visit: Payer: BC Managed Care – PPO

## 2013-03-10 ENCOUNTER — Other Ambulatory Visit (INDEPENDENT_AMBULATORY_CARE_PROVIDER_SITE_OTHER): Payer: BC Managed Care – PPO

## 2013-03-10 DIAGNOSIS — R6889 Other general symptoms and signs: Secondary | ICD-10-CM

## 2013-03-10 DIAGNOSIS — R7989 Other specified abnormal findings of blood chemistry: Secondary | ICD-10-CM

## 2013-03-10 LAB — TSH: TSH: 13.32 u[IU]/mL — ABNORMAL HIGH (ref 0.35–5.50)

## 2013-03-11 ENCOUNTER — Other Ambulatory Visit: Payer: BC Managed Care – PPO

## 2013-03-11 ENCOUNTER — Ambulatory Visit: Payer: BC Managed Care – PPO | Admitting: Internal Medicine

## 2013-03-15 ENCOUNTER — Other Ambulatory Visit: Payer: Self-pay | Admitting: *Deleted

## 2013-03-15 MED ORDER — LEVOTHYROXINE SODIUM 25 MCG PO TABS: 25.0000 ug | ORAL_TABLET | Freq: Every day | ORAL | Status: DC

## 2013-03-17 ENCOUNTER — Ambulatory Visit: Payer: BC Managed Care – PPO | Admitting: Oncology

## 2013-03-17 ENCOUNTER — Encounter: Payer: Self-pay | Admitting: Internal Medicine

## 2013-03-17 ENCOUNTER — Ambulatory Visit (INDEPENDENT_AMBULATORY_CARE_PROVIDER_SITE_OTHER): Payer: BC Managed Care – PPO | Admitting: Internal Medicine

## 2013-03-17 VITALS — BP 134/84 | HR 84 | Temp 98.5°F | Wt 157.0 lb

## 2013-03-17 DIAGNOSIS — E039 Hypothyroidism, unspecified: Secondary | ICD-10-CM

## 2013-03-17 DIAGNOSIS — I10 Essential (primary) hypertension: Secondary | ICD-10-CM

## 2013-03-17 MED ORDER — LEVOTHYROXINE SODIUM 50 MCG PO TABS: 50.0000 ug | ORAL_TABLET | Freq: Every day | ORAL | Status: DC

## 2013-03-17 MED ORDER — LOSARTAN POTASSIUM 25 MG PO TABS: 25.0000 mg | ORAL_TABLET | Freq: Every day | ORAL | Status: DC

## 2013-03-17 NOTE — Progress Notes (Signed)
Subjective:    Patient ID: Erin [name], female    DOB: Dec 15, 1960, 52 y.o.   MRN: [id number]  HPI  52 year old white female with history of breast cancer and hypertension for followup. At previous visit patient found to have elevated TSH levels. Repeat TSH shows patient is hypothyroid. She is not having any overt symptoms of hypothyroidism. She admits to mild memory issues over last 3-6 months.  Patient never treated with chemotherapy for breast cancer. She was treated with radiation therapy. Review of Systems Patient denies constipation, cold intolerance or hair loss.  Negative for goiter or thyroid nodule    Past Medical History  Diagnosis Date  . Breast cancer 2010  . Hypertension   . Allergic rhinitis due to pollen   . Osteopenia     History   Social History  . Marital Status: Single    Spouse Name: N/A    Number of Children: N/A  . Years of Education: N/A   Occupational History  . Not on file.   Social History Main Topics  . Smoking status: Never Smoker   . Smokeless tobacco: Never Used  . Alcohol Use: Yes     Comment: rare  . Drug Use: No  . Sexual Activity: Not on file   Other Topics Concern  . Not on file   Social History Narrative   She grew up in [place]   Spent last 16 yrs in [place]    Past Surgical History  Procedure Laterality Date  . Mastectomy  2010    bilateral  . Tonsillectomy    . Right knee arthroscopy  2008  . Inguinal hernia repair      right side as infant    Family History  Problem Relation Age of Onset  . Hypertension Mother   . Cancer Mother     Lung Cancer - long history of tobacco use  . Hypertension Father   . Colon cancer Neg Hx   . Stomach cancer Neg Hx     Allergies  Allergen Reactions  . Adhesive [Tape] Rash    Current Outpatient Prescriptions on File Prior to Visit  Medication Sig Dispense Refill  . acyclovir ointment (ZOVIRAX) 5 % Apply topically every 3 (three) hours.  15 g  12  . budesonide  (RHINOCORT AQUA) 32 MCG/ACT nasal spray Place 2 sprays into the nose daily. As needed      . calcium carbonate (OS-CAL) 600 MG TABS Take 600 mg by mouth 2 (two) times daily with a meal.      . Cholecalciferol (VITAMIN D3) 2000 UNITS capsule Take 2,000 Units by mouth daily.        Marland Kitchen loratadine (CLARITIN) 10 MG tablet Take 10 mg by mouth daily as needed.      . tamoxifen (NOLVADEX) 20 MG tablet Take 1 tablet (20 mg total) by mouth daily.  90 tablet  12   No current facility-administered medications on file prior to visit.    BP 134/84  Pulse 84  Temp(Src) 98.5 F (36.9 C) (Oral)  Wt 157 lb (71.215 kg)    Objective:   Physical Exam  Constitutional: She is oriented to person, place, and time. She appears well-developed and well-nourished. No distress.  HENT:  Head: Normocephalic and atraumatic.  Eyes: EOM are normal. Pupils are equal, round, and reactive to light.  Neck: Neck supple. No thyromegaly present.  No thyroid nodules  Cardiovascular: Normal rate, regular rhythm and normal heart sounds.   No murmur heard. Pulmonary/Chest:  Effort normal and breath sounds normal. She has no wheezes.  Neurological: She is alert and oriented to person, place, and time. No cranial nerve deficit.  Skin: Skin is warm.  Psychiatric: Her behavior is normal.  tearful          Assessment & Plan:

## 2013-03-17 NOTE — Assessment & Plan Note (Signed)
Well controlled.  Continue same dose of losartan. BP: 134/84 mmHg

## 2013-03-17 NOTE — Patient Instructions (Signed)
Please complete the following lab tests before your next follow up appointment: TSH, Free T4 - 244.9 Hepatitis C antibody, HIV antibody - V70

## 2013-03-17 NOTE — Assessment & Plan Note (Signed)
Patient's thyroid antibodies were negative. Unclear whether her previous radiation therapy has any bearing on current issues with hypothyroidism. Obtain thyroid ultrasound. Start levothyroxine. Patient advised to titrate to 50 mcg mid-December. Repeat TSH in 6-8 weeks.

## 2013-03-19 ENCOUNTER — Ambulatory Visit: Payer: BC Managed Care – PPO | Admitting: Internal Medicine

## 2013-03-26 ENCOUNTER — Ambulatory Visit
Admission: RE | Admit: 2013-03-26 | Discharge: 2013-03-26 | Disposition: A | Discharge status: Home / Self Care | Payer: BC Managed Care – PPO | Source: Ambulatory Visit | Attending: Internal Medicine | Admitting: Internal Medicine

## 2013-03-26 DIAGNOSIS — E039 Hypothyroidism, unspecified: Secondary | ICD-10-CM

## 2013-04-06 ENCOUNTER — Ambulatory Visit (INDEPENDENT_AMBULATORY_CARE_PROVIDER_SITE_OTHER): Payer: BC Managed Care – PPO

## 2013-04-06 VITALS — BP 129/75 | HR 76 | Resp 16 | Ht 65.0 in | Wt 154.0 lb

## 2013-04-06 DIAGNOSIS — M722 Plantar fascial fibromatosis: Secondary | ICD-10-CM

## 2013-04-06 DIAGNOSIS — M779 Enthesopathy, unspecified: Secondary | ICD-10-CM

## 2013-04-06 DIAGNOSIS — R52 Pain, unspecified: Secondary | ICD-10-CM

## 2013-04-06 DIAGNOSIS — M21619 Bunion of unspecified foot: Secondary | ICD-10-CM

## 2013-04-06 DIAGNOSIS — M21621 Bunionette of right foot: Secondary | ICD-10-CM

## 2013-04-06 MED ORDER — MELOXICAM 15 MG PO TABS: 15.0000 mg | ORAL_TABLET | Freq: Every day | ORAL | Status: DC

## 2013-04-06 NOTE — Progress Notes (Signed)
   Subjective:    Patient ID: Erin [name], female    DOB: 04-03-1961, 52 y.o.   MRN: [id number]  "I'm pretty sore I need new orthotics.  I have a little bump on the side of my foot, sometime it hurts."  Foot Pain This is a new (5th Metatarsal Right Painful) problem. Episode onset: since the summer. The problem occurs intermittently. The problem has been waxing and waning. Nothing aggravates the symptoms.      Review of Systems  Constitutional: Positive for unexpected weight change.  Allergic/Immunologic: Positive for environmental allergies.  Hematological: Bruises/bleeds easily.  All other systems reviewed and are negative.       Objective:   Physical Exam Neurovascular status is intact with pedal pulses palpable DP and PT +2/4 bilateral Refill timed 3-4 seconds skin temperature warm turgor normal no edema rubor pallor or varicosities noted. Neurologically epicritic and proprioceptive sensations appear to be intact and symmetric. Orthopedic biomechanical exam reveals pain and tenderness on palpation mid arch. Patient is a history of plantar fasciitis. No severe pain at this time has been wearing orthoses for years however needs replacement at this time. Patient also has flexible contractures of digits 234 and 5. Bilateral. Patient does have slight prominence of the fifth MTP area slight tailor bunion deformity right foot with adductovarus rotation of the fifth digit right more so than left. X-rays otherwise unremarkable no signs of fracture or other osseous abnormality or identified hallux is relatively rectus as her other lesser digits. Patient is 2 sets of orthotics and thinner so for dress shoes and a thick present but extra padding of the orthotics have a Morton's extension dying should do not think is necessary patient is having some functional limitus has a flexible first MTP joint however starting to form some dorsal spurring over the first metatarsal Jerney Baksh a Morton's extension  back she promote a hallux limitus and some further arthritic degenerative changes at the MTP joint. Instead I think patient would benefit from any metatarsal pad 2 through 4 to help plantarflexed lesser digits and prevent a neuroma type symptomology.       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 At this time orthotic scan is carried out for new functional orthoses Spenco top cover suite bottom cover with a met pad to be included. Patient be recheck within the next 4-5 weeks for orthotic pick up and fitting and adjustments in the future as needed. Prescription for Mobic called in to her pharmacy. Also stressed using a straight last versus curved last shoe  Alvan Dame DPM

## 2013-04-06 NOTE — Patient Instructions (Signed)
ICE INSTRUCTIONS  Apply ice or cold pack to the affected area at least 3 times a day for 10-15 minutes each time.  You should also use ice after prolonged activity or vigorous exercise.  Do not apply ice longer than 20 minutes at one time.  Always keep a cloth between your skin and the ice pack to prevent burns.  Being consistent and following these instructions will help control your symptoms.  We suggest you purchase a gel ice pack because they are reusable and do bit leak.  Some of them are designed to wrap around the area.  Use the method that works best for you.  Here are some other suggestions for icing.   Use a frozen bag of peas or corn-inexpensive and molds well to your body, usually stays frozen for 10 to 20 minutes.  Wet a towel with cold water and squeeze out the excess until it's damp.  Place in a bag in the freezer for 20 minutes. Then remove and use.   WEARING INSTRUCTIONS FOR ORTHOTICS  Don't expect to be comfortable wearing your orthotic devices for the first time.  Like eyeglasses, you may be aware of them as time passes, they will not be uncomfortable and you will enjoy wearing them.  FOLLOW THESE INSTRUCTIONS EXACTLY!  1. Wear your orthotic devices for:       Not more than 1 hour the first day.       Not more than 2 hours the second day.       Not more than 3 hours the third day and so on.        Or wear them for as long as they feel comfortable.       If you experience discomfort in your feet or legs take them out.  When feet & legs feel       better, put them back in.  You do need to be consistent and wear them a little        everyday. 2.   If at any time the orthotic devices become acutely uncomfortable before the       time for that particular day, STOP WEARING THEM. 3.   On the next day, do not increase the wearing time. 4.   Subsequently, increase the wearing time by 15-30 minutes only if comfortable to do       so. 5.   You will be seen by your doctor about  2-4 weeks after you receive your orthotic       devices, at which time you will probably be wearing your devices comfortably        for about 8 hours or more a day. 6.   Some patients occasionally report mild aches or discomfort in other parts of the of       body such as the knees, hips or back after 3 or 4 consecutive hours of wear.  If this       is the case with you, do not extend your wearing time.  Instead, cut it back an hour or       two.  In all likelihood, these symptoms will disappear in a short period of time as your       body posture realigns itself and functions more efficiently. 7.   It is possible that your orthotic device may require some small changes or adjustment       to improve their function or make them more comfortable.     This is usually not done       before one to three months have elapsed.  These adjustments are made in        accordance with the changed position your feet are assuming as a result of       improved biomechanical function. 8.   In women's shoes, it's not unusual for your heel to slip out of the shoe, particularly if       they are step-in-shoes.  If this is the case, try other shoes or other styles.  Try to       purchase shoes which have deeper heal seats or higher heel counters. 9.   Squeaking of orthotics devices in the shoes is due to the movement of the devices       when they are functioning normally.  To eliminate squeaking, simply dust some       baby powder into your shoes before inserting the devices.  If this does not work,        apply soap or wax to the edges of the orthotic devices or put a tissue into the shoes. 10. It is important that you follow these directions explicitly.  Failure to do so will simply       prolong the adjustment period or create problems which are easily avoided.  It makes       no difference if you are wearing your orthotic devices for only a few hours after        several months, so long as you are wearing them  comfortably for those hours. 11. If you have any questions or complaints, contact our office.  We have no way of       knowing about your problems unless you tell us.  If we do not hear from you, we will       assume that you are proceeding well.  

## 2013-05-05 ENCOUNTER — Other Ambulatory Visit (INDEPENDENT_AMBULATORY_CARE_PROVIDER_SITE_OTHER): Payer: BC Managed Care – PPO

## 2013-05-05 DIAGNOSIS — E039 Hypothyroidism, unspecified: Secondary | ICD-10-CM

## 2013-05-05 DIAGNOSIS — Z0000 Encounter for general adult medical examination without abnormal findings: Secondary | ICD-10-CM

## 2013-05-05 LAB — TSH: TSH: 1.81 u[IU]/mL (ref 0.35–5.50)

## 2013-05-05 LAB — T4, FREE: Free T4: 1.02 ng/dL (ref 0.60–1.60)

## 2013-05-06 LAB — HEPATITIS C ANTIBODY: HCV Ab: NEGATIVE

## 2013-05-06 LAB — HIV ANTIBODY (ROUTINE TESTING W REFLEX): HIV: NONREACTIVE

## 2013-05-17 ENCOUNTER — Encounter: Payer: Self-pay | Admitting: Internal Medicine

## 2013-05-17 ENCOUNTER — Ambulatory Visit (INDEPENDENT_AMBULATORY_CARE_PROVIDER_SITE_OTHER): Payer: BC Managed Care – PPO | Admitting: Internal Medicine

## 2013-05-17 VITALS — BP 136/82 | HR 84 | Temp 98.4°F | Ht 65.0 in | Wt 157.0 lb

## 2013-05-17 DIAGNOSIS — E039 Hypothyroidism, unspecified: Secondary | ICD-10-CM

## 2013-05-17 DIAGNOSIS — I10 Essential (primary) hypertension: Secondary | ICD-10-CM

## 2013-05-17 MED ORDER — LEVOTHYROXINE SODIUM 50 MCG PO TABS: 50.0000 ug | ORAL_TABLET | Freq: Every day | ORAL | Status: DC

## 2013-05-17 NOTE — Assessment & Plan Note (Signed)
Patient likely has autoimmune thyroid disease. However her thyroid antibodies were negative. Patient is euthyroid on current dose of levothyroxine-50 mcg. We discussed monitoring her thyroid function studies every 6 months. Lab Results  Component Value Date   TSH 1.81 05/05/2013

## 2013-05-17 NOTE — Patient Instructions (Signed)
Please complete the following lab tests before your next follow up appointment: TSH - 244.9 BMET - 401.9

## 2013-05-17 NOTE — Progress Notes (Signed)
Subjective:    Patient ID: Erin [name], female    DOB: 02-26-1961, 53 y.o.   MRN: [id number]  HPI  53 year old white female with history of breast cancer and hypertension for followup. At previous visit patient was started on thyroid replacement therapy. She is currently taking levothyroxine 50 mcg once daily. Her TSH has normalized. Patient reports feeling slightly better  (mental alertness).  She never experienced significant fatigue, weight gain or cold intolerance.  Thyroid ultrasound 02/2013. Two 2 mm nodules/cysts in the right mid thyroid gland, benign.   Hypertension - stable.   Review of Systems No significant change in appetite.  Her menstrual cycles getting irregular.  She is unsure whether this is related to start of levothyroxine.    Past Medical History  Diagnosis Date  . Breast cancer 2010  . Hypertension   . Allergic rhinitis due to pollen   . Osteopenia     History   Social History  . Marital Status: Single    Spouse Name: N/A    Number of Children: N/A  . Years of Education: N/A   Occupational History  . Not on file.   Social History Main Topics  . Smoking status: Never Smoker   . Smokeless tobacco: Never Used  . Alcohol Use: Yes     Comment: rare  . Drug Use: No  . Sexual Activity: Not on file   Other Topics Concern  . Not on file   Social History Narrative   She grew up in [place] last 16 yrs in [place]    Past Surgical History  Procedure Laterality Date  . Mastectomy  2010    bilateral  . Tonsillectomy    . Right knee arthroscopy  2008  . Inguinal hernia repair      right side as infant    Family History  Problem Relation Age of Onset  . Hypertension Mother   . Cancer Mother     Lung Cancer - long history of tobacco use  . Hypertension Father   . Colon cancer Neg Hx   . Stomach cancer Neg Hx     Allergies  Allergen Reactions  . Adhesive [Tape] Rash    Current Outpatient Prescriptions on File Prior to  Visit  Medication Sig Dispense Refill  . acyclovir ointment (ZOVIRAX) 5 % Apply topically every 3 (three) hours.  15 g  12  . budesonide (RHINOCORT AQUA) 32 MCG/ACT nasal spray Place 2 sprays into the nose daily. As needed      . calcium carbonate (OS-CAL) 600 MG TABS Take 600 mg by mouth 2 (two) times daily with a meal.      . Cholecalciferol (VITAMIN D3) 2000 UNITS capsule Take 2,000 Units by mouth daily.        Marland Kitchen loratadine (CLARITIN) 10 MG tablet Take 10 mg by mouth daily as needed.      Marland Kitchen losartan (COZAAR) 25 MG tablet Take 1 tablet (25 mg total) by mouth daily.  90 tablet  3  . meloxicam (MOBIC) 15 MG tablet Take 1 tablet (15 mg total) by mouth daily.  30 tablet  2  . tamoxifen (NOLVADEX) 20 MG tablet Take 1 tablet (20 mg total) by mouth daily.  90 tablet  12   No current facility-administered medications on file prior to visit.    BP 136/82  Pulse 84  Temp(Src) 98.4 F (36.9 C) (Oral)  Ht 5\' 5"  (1.651 m)  Wt 157 lb (71.215 kg)  BMI  26.13 kg/m2    Objective:   Physical Exam  Constitutional: She is oriented to person, place, and time. She appears well-developed and well-nourished. No distress.  Neck: Neck supple. No thyromegaly present.  Cardiovascular: Normal rate, regular rhythm and normal heart sounds.   No murmur heard. Pulmonary/Chest: Effort normal and breath sounds normal. She has no wheezes.  Neurological: She is alert and oriented to person, place, and time. No cranial nerve deficit.  Skin: Skin is warm and dry.  Psychiatric: She has a normal mood and affect. Her behavior is normal.          Assessment & Plan:

## 2013-05-17 NOTE — Progress Notes (Signed)
Pre visit review using our clinic review tool, if applicable. No additional management support is needed unless otherwise documented below in the visit note. 

## 2013-05-17 NOTE — Assessment & Plan Note (Signed)
BP is well controlled.  No change in medication.  Monitor electrolytes and kidney function. BP: 136/82 mmHg ` Lab Results  Component Value Date   CREATININE 0.8 02/16/2013

## 2013-05-18 ENCOUNTER — Ambulatory Visit (INDEPENDENT_AMBULATORY_CARE_PROVIDER_SITE_OTHER): Payer: BC Managed Care – PPO

## 2013-05-18 VITALS — BP 148/86 | HR 86 | Resp 16

## 2013-05-18 DIAGNOSIS — M722 Plantar fascial fibromatosis: Secondary | ICD-10-CM

## 2013-05-18 DIAGNOSIS — M779 Enthesopathy, unspecified: Secondary | ICD-10-CM

## 2013-05-18 DIAGNOSIS — R52 Pain, unspecified: Secondary | ICD-10-CM

## 2013-05-18 NOTE — Progress Notes (Signed)
   Subjective:    Patient ID: Erin [name], female    DOB: 03-Mar-1961, 53 y.o.   MRN: [id number]  HPI Comments: "My feet have been okay"  Patient presents to pick up orthotics today. Instructions were reviewed for wearing and copy will be given at checkout.       Review of Systems no new changes or findings     Objective:   Physical Exam Neurovascular status is intact still has some slight tailor bunion issues on the right side although not as severe. Narcotics are dispensed with break in wearing instructions oral and written instructions are given at this time. The orthotics fit and contour well have a thick Spenco padded extension to there is also a metatarsal pad which was explained the patient should help with the metatarsalgia and tailor bunion pain. Patient may at some point in the future drop off her old orthotics for refurbish her recovery or consider ordering a second set of orthotics at her convenience. Suggested 2 month followup on an as-needed basis for adjustments if needed       Assessment & Plan:  Assessment this time plantar fasciitis/heel spur syndrome as well as capsulitis and tailor bunion deformity. Orthotics are dispensed at this time I for possible followup with in 2 months if needed explained the fit of a shoe with a straight last type shoe recommended that  Harriet Masson DPM

## 2013-05-18 NOTE — Patient Instructions (Signed)

## 2013-05-26 ENCOUNTER — Telehealth: Payer: Self-pay | Admitting: Internal Medicine

## 2013-05-26 NOTE — Telephone Encounter (Signed)
Relevant patient education assigned to patient using Emmi. ° °

## 2013-06-25 LAB — HM PAP SMEAR: HM Pap smear: NORMAL

## 2013-07-16 ENCOUNTER — Encounter: Payer: Self-pay | Admitting: Internal Medicine

## 2013-07-16 ENCOUNTER — Ambulatory Visit: Payer: BC Managed Care – PPO

## 2013-07-20 ENCOUNTER — Ambulatory Visit (INDEPENDENT_AMBULATORY_CARE_PROVIDER_SITE_OTHER): Payer: BC Managed Care – PPO

## 2013-07-20 VITALS — BP 134/80 | HR 83 | Resp 16

## 2013-07-20 DIAGNOSIS — R52 Pain, unspecified: Secondary | ICD-10-CM

## 2013-07-20 DIAGNOSIS — M21619 Bunion of unspecified foot: Secondary | ICD-10-CM

## 2013-07-20 DIAGNOSIS — M21621 Bunionette of right foot: Secondary | ICD-10-CM

## 2013-07-20 DIAGNOSIS — M779 Enthesopathy, unspecified: Secondary | ICD-10-CM

## 2013-07-20 DIAGNOSIS — M722 Plantar fascial fibromatosis: Secondary | ICD-10-CM

## 2013-07-20 NOTE — Progress Notes (Signed)
   Subjective:    Patient ID: Erin [name], female    DOB: 05-21-1960, 53 y.o.   MRN: [id number]  HPI Comments: "I'm not so sure about the orthotics. They are a little more comfortable now, but that pad, if it moves out of place when I'm walking it can get really uncomfortable."  Orthotic Check      Review of Systems no new changes or findings     Objective:   Physical Exam Neurovascular status is intact pedal pulses palpable epicritic and proprioceptive sensations intact and symmetric bilateral is normal plantar response DTRs not listed dermatologic the skin color pigment hair growth are normal patient been wearing orthotics the met pad is causing irritation special or shorts or clunks or foot x-rays noted to slide around her slight forward in the met pad goes down to the arch causing some irritation such severe but she feels a little bruise in the arch otherwise the orthotics fit and contour well she's gotten better is normal as to 3 months and she's got the orthotics and she is improving but not completely comfortable with at this time previous he and orthotic with a Morton's extension however that was in appropriate shoes are very flexible and mobile first metatarsal with hypermobility patient would benefit from second orthotic this time without the met head for use in her clogs or other shoes may need further adjustments in the future as needed       Assessment & Plan:  Assessment plantar fasciitis/heel spur syndrome. Capsulitis of the forefoot plantar this time digital contractures are benefiting from that pad however she can't tolerate them in her clogs will order second set of orthotics and we have the patient without the met pad included patient be called when orthotics rate for fitting and dispensing in the next couple weeks next  Erin [name] DPM

## 2013-07-28 ENCOUNTER — Other Ambulatory Visit: Payer: Self-pay | Admitting: Oncology

## 2013-07-28 DIAGNOSIS — C50919 Malignant neoplasm of unspecified site of unspecified female breast: Secondary | ICD-10-CM

## 2013-07-29 ENCOUNTER — Telehealth: Payer: Self-pay | Admitting: Oncology

## 2013-07-29 NOTE — Telephone Encounter (Signed)
lvm for pt regarding to Oct lab moved form 10.27 to 10.20 per pof....mailed pt appt sched/avs and letter

## 2013-08-17 ENCOUNTER — Ambulatory Visit: Payer: BC Managed Care – PPO

## 2013-08-17 VITALS — BP 147/90 | HR 92 | Resp 12

## 2013-08-17 DIAGNOSIS — R52 Pain, unspecified: Secondary | ICD-10-CM

## 2013-08-17 DIAGNOSIS — M722 Plantar fascial fibromatosis: Secondary | ICD-10-CM

## 2013-08-17 DIAGNOSIS — M779 Enthesopathy, unspecified: Secondary | ICD-10-CM

## 2013-08-17 NOTE — Progress Notes (Signed)
   Subjective:    Patient ID: Erin [name], female    DOB: April 26, 1960, 53 y.o.   MRN: [id number]  HPI PICK UP ORTHOTICS AND GIVEN INSTRUCTION.   Review of Systems no systemic changes or findings are noted     Objective:   Physical Exam Neurovascular status is intact no changes or findings at this time. Neurovascular status intact. Patient presents for orthotic pickup. The new orthotics however do not sit or contoured to the patient's foot they are too wide at the heels and midfoot with not enough arch height on exam this orthotic does not appear to be the right casting for the patient the orthotics are excessively long and did not fit appropriately easily sent back in the labral than refurbish a redo this orthotic is rigid cast for her in December of 2014 with Spenco top cover and no met pad patient be contacted when orthotics and a redundant are ready for fitting and trial       Assessment & Plan:  Assessment plantar fasciitis/heel spur syndrome patient presents for a new orthotic her second orthotic for the orthotic that was sent is not correct these are sent back to the lab for correction and refitting  Harriet Masson DPM

## 2013-08-17 NOTE — Patient Instructions (Signed)

## 2013-08-19 NOTE — Patient Instructions (Addendum)
   Your procedure is scheduled on:  Thursday, May 28  Enter through the Main Entrance of [facility] at: 8 AM Pick up the phone at the desk and dial [phone] and inform us of your arrival.  Please call this number if you have any problems the morning of surgery: [phone]  Remember: Do not eat or drink after midnight: Wednesday Take these medicines the morning of surgery with a SIP OF WATER: lorsartan, tamoxifen, levothyroxine, claritin if needed  Do not wear jewelry, make-up, or FINGER nail polish No metal in your hair or on your body. Do not wear lotions, powders, perfumes.  You may wear deodorant.  Do not bring valuables to the hospital. Contacts, dentures or bridgework may not be worn into surgery.  Patients discharged on the day of surgery will not be allowed to drive home.  Home with Erin [name] cell [phone].

## 2013-08-23 ENCOUNTER — Other Ambulatory Visit: Payer: Self-pay | Admitting: Obstetrics & Gynecology

## 2013-08-23 ENCOUNTER — Encounter (HOSPITAL_COMMUNITY)
Admission: RE | Admit: 2013-08-23 | Discharge: 2013-08-23 | Disposition: A | Discharge status: Home / Self Care | Payer: BC Managed Care – PPO | Source: Ambulatory Visit | Attending: Obstetrics & Gynecology | Admitting: Obstetrics & Gynecology

## 2013-08-23 ENCOUNTER — Encounter (HOSPITAL_COMMUNITY): Payer: Self-pay

## 2013-08-23 ENCOUNTER — Encounter (HOSPITAL_COMMUNITY): Payer: Self-pay | Admitting: Pharmacist

## 2013-08-23 DIAGNOSIS — Z01810 Encounter for preprocedural cardiovascular examination: Secondary | ICD-10-CM | POA: Insufficient documentation

## 2013-08-23 DIAGNOSIS — Z01812 Encounter for preprocedural laboratory examination: Secondary | ICD-10-CM | POA: Insufficient documentation

## 2013-08-23 HISTORY — DX: Hypothyroidism, unspecified: E03.9

## 2013-08-23 LAB — BASIC METABOLIC PANEL
BUN: 16 mg/dL (ref 6–23)
CHLORIDE: 101 meq/L (ref 96–112)
CO2: 27 mEq/L (ref 19–32)
Calcium: 9.2 mg/dL (ref 8.4–10.5)
Creatinine, Ser: 0.88 mg/dL (ref 0.50–1.10)
GFR calc Af Amer: 87 mL/min — ABNORMAL LOW (ref >90)
GFR calc non Af Amer: 75 mL/min — ABNORMAL LOW (ref >90)
Glucose, Bld: 71 mg/dL (ref 70–99)
POTASSIUM: 4.1 meq/L (ref 3.7–5.3)
Sodium: 138 mEq/L (ref 137–147)

## 2013-08-23 LAB — CBC
HCT: 41.1 % (ref 36.0–46.0)
Hemoglobin: 14.1 g/dL (ref 12.0–15.0)
MCH: 29.7 pg (ref 26.0–34.0)
MCHC: 34.3 g/dL (ref 30.0–36.0)
MCV: 86.7 fL (ref 78.0–100.0)
PLATELETS: 199 10*3/uL (ref 150–400)
RBC: 4.74 MIL/uL (ref 3.87–5.11)
RDW: 12.4 % (ref 11.5–15.5)
WBC: 5.6 10*3/uL (ref 4.0–10.5)

## 2013-09-09 ENCOUNTER — Other Ambulatory Visit: Payer: Self-pay | Admitting: *Deleted

## 2013-09-09 ENCOUNTER — Telehealth: Payer: Self-pay | Admitting: Oncology

## 2013-09-09 NOTE — Telephone Encounter (Signed)
pt cld in to see why lab was moved two ahead of appt/adv per GM tyo move no reason/pt rq to spk w/nurse

## 2013-09-09 NOTE — Progress Notes (Signed)
This RN received message from pt stating due to ongoing uterine thickening.  Pt is scheduled for uterine hysteroscopy later this month and is inquiring about follow up with MD post procedure to discuss resuming this medication or other plan of care.  Per discussion pt will stop her tamoxifen at present and proceed to above procedure. Appointment will be made for follow up with this office in June.  Questions answered by this RN per pt's inquiry about uterine thickening and uterine polyps and cancer concerns.  No further needs at this time.  Appointment request sent to scheduling.

## 2013-09-14 ENCOUNTER — Telehealth: Payer: Self-pay | Admitting: Oncology

## 2013-09-14 NOTE — Telephone Encounter (Signed)
added fu for 6/16. lmonvm for pt and mailed scheduled. other appts remain the same.

## 2013-09-15 ENCOUNTER — Telehealth: Payer: Self-pay | Admitting: Oncology

## 2013-09-15 NOTE — Telephone Encounter (Signed)
returned pt call-pt stated needed to come in for appt-having surgery 5/28 and tlwd w/ Val-gave pt date & time for appt

## 2013-09-16 ENCOUNTER — Ambulatory Visit (HOSPITAL_COMMUNITY): Payer: BC Managed Care – PPO | Admitting: Anesthesiology

## 2013-09-16 ENCOUNTER — Ambulatory Visit (HOSPITAL_COMMUNITY)
Admission: RE | Admit: 2013-09-16 | Discharge: 2013-09-16 | Disposition: A | Discharge status: Home / Self Care | Payer: BC Managed Care – PPO | Source: Ambulatory Visit | Attending: Obstetrics & Gynecology | Admitting: Obstetrics & Gynecology

## 2013-09-16 ENCOUNTER — Encounter (HOSPITAL_COMMUNITY): Payer: BC Managed Care – PPO | Admitting: Anesthesiology

## 2013-09-16 ENCOUNTER — Encounter (HOSPITAL_COMMUNITY)
Admission: RE | Disposition: A | Discharge status: Home / Self Care | Payer: Self-pay | Source: Ambulatory Visit | Attending: Obstetrics & Gynecology

## 2013-09-16 ENCOUNTER — Encounter (HOSPITAL_COMMUNITY): Payer: Self-pay | Admitting: Registered Nurse

## 2013-09-16 DIAGNOSIS — N840 Polyp of corpus uteri: Secondary | ICD-10-CM | POA: Insufficient documentation

## 2013-09-16 DIAGNOSIS — N854 Malposition of uterus: Secondary | ICD-10-CM | POA: Insufficient documentation

## 2013-09-16 DIAGNOSIS — M899 Disorder of bone, unspecified: Secondary | ICD-10-CM | POA: Insufficient documentation

## 2013-09-16 DIAGNOSIS — E039 Hypothyroidism, unspecified: Secondary | ICD-10-CM | POA: Insufficient documentation

## 2013-09-16 DIAGNOSIS — M949 Disorder of cartilage, unspecified: Secondary | ICD-10-CM

## 2013-09-16 DIAGNOSIS — I10 Essential (primary) hypertension: Secondary | ICD-10-CM | POA: Insufficient documentation

## 2013-09-16 HISTORY — PX: DILATATION & CURRETTAGE/HYSTEROSCOPY WITH RESECTOCOPE: SHX5572

## 2013-09-16 LAB — PREGNANCY, URINE: PREG TEST UR: NEGATIVE

## 2013-09-16 SURGERY — DILATATION & CURETTAGE/HYSTEROSCOPY WITH RESECTOCOPE
Anesthesia: General | Site: Vagina

## 2013-09-16 MED ORDER — MIDAZOLAM HCL 2 MG/2ML IJ SOLN
INTRAMUSCULAR | Status: AC
  Filled 2013-09-16: qty 2

## 2013-09-16 MED ORDER — OXYCODONE-ACETAMINOPHEN 7.5-325 MG PO TABS: 1.0000 | ORAL_TABLET | ORAL | Status: DC | PRN

## 2013-09-16 MED ORDER — ONDANSETRON HCL 4 MG/2ML IJ SOLN
INTRAMUSCULAR | Status: AC
  Filled 2013-09-16: qty 2

## 2013-09-16 MED ORDER — CEFAZOLIN SODIUM-DEXTROSE 2-3 GM-% IV SOLR
2.0000 g | INTRAVENOUS | Status: AC
  Administered 2013-09-16: 2 g via INTRAVENOUS

## 2013-09-16 MED ORDER — FENTANYL CITRATE 0.05 MG/ML IJ SOLN
INTRAMUSCULAR | Status: AC
  Filled 2013-09-16: qty 5

## 2013-09-16 MED ORDER — KETOROLAC TROMETHAMINE 30 MG/ML IJ SOLN
INTRAMUSCULAR | Status: DC | PRN
  Administered 2013-09-16: 30 mg via INTRAVENOUS

## 2013-09-16 MED ORDER — ONDANSETRON HCL 4 MG/2ML IJ SOLN
INTRAMUSCULAR | Status: DC | PRN
  Administered 2013-09-16: 4 mg via INTRAVENOUS

## 2013-09-16 MED ORDER — LACTATED RINGERS IV SOLN
INTRAVENOUS | Status: DC
Start: 2013-09-16 — End: 2013-09-16
  Administered 2013-09-16 (×2): via INTRAVENOUS

## 2013-09-16 MED ORDER — PROPOFOL 10 MG/ML IV BOLUS
INTRAVENOUS | Status: DC | PRN
  Administered 2013-09-16: 200 mg via INTRAVENOUS

## 2013-09-16 MED ORDER — PROPOFOL 10 MG/ML IV EMUL
INTRAVENOUS | Status: AC
Start: 2013-09-16 — End: 2013-09-16
  Filled 2013-09-16: qty 20

## 2013-09-16 MED ORDER — LIDOCAINE HCL (CARDIAC) 20 MG/ML IV SOLN
INTRAVENOUS | Status: AC
  Filled 2013-09-16: qty 5

## 2013-09-16 MED ORDER — FENTANYL CITRATE 0.05 MG/ML IJ SOLN
INTRAMUSCULAR | Status: DC | PRN
  Administered 2013-09-16: 100 ug via INTRAVENOUS

## 2013-09-16 MED ORDER — GLYCINE 1.5 % IR SOLN
Status: DC | PRN
  Administered 2013-09-16: 3000 mL

## 2013-09-16 MED ORDER — CEFAZOLIN SODIUM-DEXTROSE 2-3 GM-% IV SOLR
INTRAVENOUS | Status: AC
  Filled 2013-09-16: qty 50

## 2013-09-16 MED ORDER — MIDAZOLAM HCL 2 MG/2ML IJ SOLN
INTRAMUSCULAR | Status: DC | PRN
  Administered 2013-09-16: 2 mg via INTRAVENOUS

## 2013-09-16 MED ORDER — FENTANYL CITRATE 0.05 MG/ML IJ SOLN: 25.0000 ug | INTRAMUSCULAR | Status: DC | PRN

## 2013-09-16 MED ORDER — MEPERIDINE HCL 25 MG/ML IJ SOLN
6.2500 mg | INTRAMUSCULAR | Status: DC | PRN
Start: 2013-09-16 — End: 2013-09-16

## 2013-09-16 MED ORDER — FENTANYL CITRATE 0.05 MG/ML IJ SOLN
INTRAMUSCULAR | Status: AC
  Filled 2013-09-16: qty 2

## 2013-09-16 MED ORDER — PROMETHAZINE HCL 25 MG/ML IJ SOLN: 6.2500 mg | INTRAMUSCULAR | Status: DC | PRN

## 2013-09-16 MED ORDER — MIDAZOLAM HCL 2 MG/2ML IJ SOLN: 0.5000 mg | Freq: Once | INTRAMUSCULAR | Status: DC | PRN

## 2013-09-16 MED ORDER — LIDOCAINE HCL (CARDIAC) 20 MG/ML IV SOLN
INTRAVENOUS | Status: DC | PRN
  Administered 2013-09-16: 60 mg via INTRAVENOUS

## 2013-09-16 MED ORDER — KETOROLAC TROMETHAMINE 30 MG/ML IJ SOLN: 15.0000 mg | Freq: Once | INTRAMUSCULAR | Status: DC | PRN

## 2013-09-16 MED ORDER — CHLOROPROCAINE HCL 1 % IJ SOLN
INTRAMUSCULAR | Status: DC | PRN
  Administered 2013-09-16: 20 mL

## 2013-09-16 SURGICAL SUPPLY — 23 items
ABLATOR ENDOMETRIAL BIPOLAR (ABLATOR) IMPLANT
CANISTER SUCT 3000ML (MISCELLANEOUS) ×3 IMPLANT
CATH ROBINSON RED A/P 16FR (CATHETERS) ×3 IMPLANT
CLOTH BEACON ORANGE TIMEOUT ST (SAFETY) ×3 IMPLANT
CONTAINER PREFILL 10% NBF 60ML (FORM) ×6 IMPLANT
DRAPE HYSTEROSCOPY (DRAPE) ×3 IMPLANT
DRSG TELFA 3X8 NADH (GAUZE/BANDAGES/DRESSINGS) ×3 IMPLANT
ELECT REM PT RETURN 9FT ADLT (ELECTROSURGICAL) ×3
ELECTRODE REM PT RTRN 9FT ADLT (ELECTROSURGICAL) ×1 IMPLANT
GLOVE BIO SURGEON STRL SZ 6.5 (GLOVE) ×2 IMPLANT
GLOVE BIO SURGEONS STRL SZ 6.5 (GLOVE) ×1
GLOVE BIOGEL PI IND STRL 7.0 (GLOVE) ×1 IMPLANT
GLOVE BIOGEL PI INDICATOR 7.0 (GLOVE) ×2
GOWN STRL REUS W/TWL LRG LVL3 (GOWN DISPOSABLE) ×6 IMPLANT
LOOP ANGLED CUTTING 22FR (CUTTING LOOP) ×3 IMPLANT
NEEDLE SPNL 22GX3.5 QUINCKE BK (NEEDLE) ×3 IMPLANT
PACK VAGINAL MINOR WOMEN LF (CUSTOM PROCEDURE TRAY) ×3 IMPLANT
PAD OB MATERNITY 4.3X12.25 (PERSONAL CARE ITEMS) ×3 IMPLANT
SET TUBING HYSTEROSCOPY 2 NDL (TUBING) ×3 IMPLANT
SYR CONTROL 10ML LL (SYRINGE) ×3 IMPLANT
TOWEL OR 17X24 6PK STRL BLUE (TOWEL DISPOSABLE) ×6 IMPLANT
TUBE HYSTEROSCOPY W Y-CONNECT (TUBING) ×3 IMPLANT
WATER STERILE IRR 1000ML POUR (IV SOLUTION) ×3 IMPLANT

## 2013-09-16 NOTE — Anesthesia Preprocedure Evaluation (Signed)
Anesthesia Evaluation  Patient identified by MRN, date of birth, ID band Patient awake    Reviewed: Allergy & Precautions, H&P , Patient's Chart, lab work & pertinent test results, reviewed documented beta blocker date and time   History of Anesthesia Complications Negative for: history of anesthetic complications  Airway Mallampati: II TM Distance: >3 FB Neck ROM: full    Dental   Pulmonary  breath sounds clear to auscultation        Cardiovascular Exercise Tolerance: Good hypertension, Rhythm:regular Rate:Normal     Neuro/Psych negative psych ROS   GI/Hepatic   Endo/Other  Hypothyroidism   Renal/GU      Musculoskeletal   Abdominal   Peds  Hematology   Anesthesia Other Findings   Reproductive/Obstetrics                           Anesthesia Physical Anesthesia Plan  ASA: II  Anesthesia Plan: General LMA   Post-op Pain Management:    Induction:   Airway Management Planned:   Additional Equipment:   Intra-op Plan:   Post-operative Plan:   Informed Consent: I have reviewed the patients History and Physical, chart, labs and discussed the procedure including the risks, benefits and alternatives for the proposed anesthesia with the patient or authorized representative who has indicated his/her understanding and acceptance.   Dental Advisory Given  Plan Discussed with: CRNA, Surgeon and Anesthesiologist  Anesthesia Plan Comments:         Anesthesia Quick Evaluation

## 2013-09-16 NOTE — H&P (Signed)
Erin [name] is an 53 y.o. female G0   RP:  Endometrial polyps for [facility] resection, D+C  Pertinent Gynecological History: Menses: flow is moderate Contraception: abstinence/same sex partner Blood transfusions: none Sexually transmitted diseases: no past history Last mammogram: normal Last pap: normal OB History: G0   Menstrual History:  No LMP recorded.    Past Medical History  Diagnosis Date  . Breast cancer 2010  . Hypertension   . Allergic rhinitis due to pollen   . Osteopenia   . Hypothyroidism     Past Surgical History  Procedure Laterality Date  . Mastectomy  2010    bilateral  . Tonsillectomy    . Right knee arthroscopy  2008  . Inguinal hernia repair      right side as infant  . Wisdom tooth extraction    . Cyst removed      left lower jaw - at age 30 yr    Family History  Problem Relation Age of Onset  . Hypertension Mother   . Cancer Mother     Lung Cancer - long history of tobacco use  . Hypertension Father   . Colon cancer Neg Hx   . Stomach cancer Neg Hx     Social History:  reports that she has never smoked. She has never used smokeless tobacco. She reports that she drinks alcohol. She reports that she does not use illicit drugs.  Allergies:  Allergies  Allergen Reactions  . Adhesive [Tape] Rash    Prescriptions prior to admission  Medication Sig Dispense Refill  . calcium carbonate (OS-CAL) 600 MG TABS Take 600 mg by mouth 2 (two) times daily with a meal.      . Cholecalciferol (VITAMIN D3) 2000 UNITS capsule Take 2,000 Units by mouth daily.        Marland Kitchen levothyroxine (SYNTHROID, LEVOTHROID) 50 MCG tablet Take 1 tablet (50 mcg total) by mouth daily before breakfast.  90 tablet  1  . loratadine (CLARITIN) 10 MG tablet Take 10 mg by mouth daily.       Marland Kitchen losartan (COZAAR) 25 MG tablet Take 1 tablet (25 mg total) by mouth daily.  90 tablet  3  . Polyethyl Glycol-Propyl Glycol (SYSTANE OP) Apply 1 drop to eye as needed (for allergies).      .  sodium chloride (OCEAN) 0.65 % SOLN nasal spray Place 1 spray into both nostrils as needed for congestion.      . tamoxifen (NOLVADEX) 20 MG tablet Take 1 tablet (20 mg total) by mouth daily.  90 tablet  12    ROS  Blood pressure 148/85, pulse 77, temperature 99.1 F (37.3 C), temperature source Oral, resp. rate 18, SpO2 100.00%. Physical Exam  Sonohysto:  2 post IU polyps EBx:  Benign  Results for orders placed during the hospital encounter of 09/16/13 (from the past 24 hour(s))  PREGNANCY, URINE     Status: None   Collection Time    09/16/13  8:41 AM      Result Value Ref Range   Preg Test, Ur NEGATIVE  NEGATIVE    No results found.  Assessment/Plan: Endometrial polyps for Erin [name] resection, [name]  Surgery and risks reviewed.  [name] 09/16/2013, 9:18 AM

## 2013-09-16 NOTE — Transfer of Care (Signed)
Immediate Anesthesia Transfer of Care Note  Patient: Erin [name]  Procedure(s) Performed: Procedure(s) with comments: DILATATION & CURETTAGE/HYSTEROSCOPY WITH RESECTOCOPE (N/A) - 1 hr.  Patient Location: PACU  Anesthesia Type:General  Level of Consciousness: awake, alert  and oriented  Airway & Oxygen Therapy: Patient Spontanous Breathing and Patient connected to nasal cannula oxygen  Post-op Assessment: Report given to PACU RN and Post -op Vital signs reviewed and stable  Post vital signs: Reviewed and stable  Complications: No apparent anesthesia complications

## 2013-09-16 NOTE — Anesthesia Postprocedure Evaluation (Signed)
  Anesthesia Post Note  Patient: Erin [name]  Procedure(s) Performed: Procedure(s) (LRB): DILATATION & CURETTAGE/HYSTEROSCOPY WITH RESECTOCOPE (N/A)  Anesthesia type: GA  Patient location: PACU  Post pain: Pain level controlled  Post assessment: Post-op Vital signs reviewed  Last Vitals:  Filed Vitals:   09/16/13 1030  BP: 127/82  Pulse: 79  Temp:   Resp: 16    Post vital signs: Reviewed  Level of consciousness: sedated  Complications: No apparent anesthesia complications

## 2013-09-16 NOTE — Discharge Instructions (Addendum)
DISCHARGE INSTRUCTIONS: HYSTEROSCOPY / ENDOMETRIAL ABLATION The following instructions have been prepared to help you care for yourself upon your return home.  MAY TAKE IBUPROFEN (MOTRIN, ADVIL) OR ALEVE AFTER 3:50 PM FOR CRAMPS!!!  Personal hygiene:  Use sanitary pads for vaginal drainage, not tampons.  Shower the day after your procedure.  NO tub baths, pools or Jacuzzis for 2-3 weeks.  Wipe front to back after using the bathroom.  Activity and limitations:  Do NOT drive or operate any equipment for 24 hours. The effects of anesthesia are still present and drowsiness may result.  Do NOT rest in bed all day.  Walking is encouraged.  Walk up and down stairs slowly.  You may resume your normal activity in one to two days or as indicated by your physician. Sexual activity: NO intercourse for at least 2 weeks after the procedure, or as indicated by your Doctor.  Diet: Eat a light meal as desired this evening. You may resume your usual diet tomorrow.  Return to Work: You may resume your work activities in one to two days or as indicated by Marine scientist.  What to expect after your surgery: Expect to have vaginal bleeding/discharge for 2-3 days and spotting for up to 10 days. It is not unusual to have soreness for up to 1-2 weeks. You may have a slight burning sensation when you urinate for the first day. Mild cramps may continue for a couple of days. You may have a regular period in 2-6 weeks.  Call your doctor for any of the following:  Excessive vaginal bleeding or clotting, saturating and changing one pad every hour.  Inability to urinate 6 hours after discharge from hospital.  Pain not relieved by pain medication.  Fever of 100.4 F or greater.  Unusual vaginal discharge or odor.  Return to office _________________Call for an appointment ___________________ Patients signature: ______________________ Nurses signature ________________________  [facility] [phone]

## 2013-09-16 NOTE — Op Note (Signed)
09/16/2013  9:59 AM  PATIENT:  Erin [name]  53 y.o. female  PRE-OPERATIVE DIAGNOSIS:  Endometrial Polyps  POST-OPERATIVE DIAGNOSIS:  Endometrial polyps  PROCEDURE:  Procedure(s): DILATATION & CURETTAGE/HYSTEROSCOPY WITH RESECTOCOPE  SURGEON:  Surgeon(s): Princess Bruins, MD  ASSISTANTS: none   ANESTHESIA:   general  Procedure:  Under general anesthesia with laryngeal mask. The patient is in lithotomy position. She is prepped with Betadine on the suprapubic, vulvar and vaginal areas. She is draped as usual. The vaginal exam reveals an anteverted uterus normal volume, mobile, no adnexal mass.  The speculum is inserted in the vagina. The anterior lip of the cervix is grasped with a tenaculum.  A paracervical block is done with Nesacaine 1% a total of 20 cc at 4 and 8:00. The hysterometry is at 9 cm. The cervix is dilated with Hegar dilators up to # 21 without difficulty.  The operative hysteroscope with the loop is inserted in the intrauterine cavity. Both ostia are well visualized. An endometrial polyp is present on the right posterior wall of the uterus.  The endometrium appeared thickened on the anterior wall of the uterus.  The loop is used to resect those 2 areas.  Hemostasis is adequate. Pictures were taken before and after resection. We then removed the hysteroscope. We proceed with a curettage of the intrauterine cavity on all surfaces with a sharp curet. The endometrial curettings are sent together with the resection material to pathology.  The tenaculum was removed from the cervix. Silver nitrate is used at that level to control hemostasis. The speculum is removed.  The patient is brought to recovery room in good and stable status.  ESTIMATED BLOOD LOSS: 25 cc   Intake/Output Summary (Last 24 hours) at 09/16/13 0959 Last data filed at 09/16/13 0954  Gross per 24 hour  Intake   1000 ml  Output     25 ml  Net    975 ml     BLOOD ADMINISTERED:none   LOCAL MEDICATIONS USED:   Nesacaine 1% 20 cc for Paracervical block  SPECIMEN:  Source of Specimen:  Resection material, probable endometrial polyp, Endometrial curettings  DISPOSITION OF SPECIMEN:  PATHOLOGY  COUNTS:  YES  PLAN OF CARE: Transfer to PACU    Princess Bruins MD  09/16/2013 at 9:59 am

## 2013-09-16 NOTE — Discharge Summary (Signed)
  Physician Discharge Summary  Patient ID: Erin [name] MRN: [id number] DOB/AGE: 52-15-1962 52 y.o.  Admit date: 09/16/2013 Discharge date: 09/16/2013  Admission Diagnoses: Endometrial Polyps  Discharge Diagnoses: Endometrial Polyps        Active Problems:   * No active hospital problems. *   Discharged Condition: good  Hospital Course: Outpatient  Consults: None  Treatments: surgery: Hysteroscopy with resection and D+C  Disposition: Final discharge disposition not confirmed     Medication List         calcium carbonate 600 MG Tabs tablet  Commonly known as:  OS-CAL  Take 600 mg by mouth 2 (two) times daily with a meal.     levothyroxine 50 MCG tablet  Commonly known as:  SYNTHROID, LEVOTHROID  Take 1 tablet (50 mcg total) by mouth daily before breakfast.     loratadine 10 MG tablet  Commonly known as:  CLARITIN  Take 10 mg by mouth daily.     losartan 25 MG tablet  Commonly known as:  COZAAR  Take 1 tablet (25 mg total) by mouth daily.     oxyCODONE-acetaminophen 7.5-325 MG per tablet  Commonly known as:  PERCOCET  Take 1 tablet by mouth every 4 (four) hours as needed for pain.     sodium chloride 0.65 % Soln nasal spray  Commonly known as:  OCEAN  Place 1 spray into both nostrils as needed for congestion.     SYSTANE OP  Apply 1 drop to eye as needed (for allergies).     tamoxifen 20 MG tablet  Commonly known as:  NOLVADEX  Take 1 tablet (20 mg total) by mouth daily.     Vitamin D3 2000 UNITS capsule  Take 2,000 Units by mouth daily.           Follow-up Information   Follow up with Evynn Boutelle,MARIE-LYNE, MD In 3 weeks.   Specialty:  Obstetrics and Gynecology   Contact information:   [place] [address] [phone]       Signed: Princess Bruins, MD 09/16/2013, 10:07 AM

## 2013-09-17 ENCOUNTER — Encounter (HOSPITAL_COMMUNITY): Payer: Self-pay | Admitting: Obstetrics & Gynecology

## 2013-09-24 ENCOUNTER — Other Ambulatory Visit: Payer: Self-pay | Admitting: *Deleted

## 2013-09-24 DIAGNOSIS — C50412 Malignant neoplasm of upper-outer quadrant of left female breast: Secondary | ICD-10-CM

## 2013-09-27 ENCOUNTER — Other Ambulatory Visit (HOSPITAL_BASED_OUTPATIENT_CLINIC_OR_DEPARTMENT_OTHER): Payer: BC Managed Care – PPO

## 2013-09-27 DIAGNOSIS — C50919 Malignant neoplasm of unspecified site of unspecified female breast: Secondary | ICD-10-CM

## 2013-09-27 DIAGNOSIS — C50412 Malignant neoplasm of upper-outer quadrant of left female breast: Secondary | ICD-10-CM

## 2013-09-27 LAB — COMPREHENSIVE METABOLIC PANEL (CC13)
ALK PHOS: 51 U/L (ref 40–150)
ALT: 10 U/L (ref 0–55)
ANION GAP: 9 meq/L (ref 3–11)
AST: 16 U/L (ref 5–34)
Albumin: 3.4 g/dL — ABNORMAL LOW (ref 3.5–5.0)
BILIRUBIN TOTAL: 0.72 mg/dL (ref 0.20–1.20)
BUN: 12.9 mg/dL (ref 7.0–26.0)
CO2: 25 mEq/L (ref 22–29)
CREATININE: 0.8 mg/dL (ref 0.6–1.1)
Calcium: 8.9 mg/dL (ref 8.4–10.4)
Chloride: 103 mEq/L (ref 98–109)
GLUCOSE: 107 mg/dL (ref 70–140)
Potassium: 4.3 mEq/L (ref 3.5–5.1)
SODIUM: 137 meq/L (ref 136–145)
TOTAL PROTEIN: 6.3 g/dL — AB (ref 6.4–8.3)

## 2013-09-27 LAB — CBC WITH DIFFERENTIAL/PLATELET
BASO%: 1.1 % (ref 0.0–2.0)
Basophils Absolute: 0.1 10*3/uL (ref 0.0–0.1)
EOS ABS: 0.1 10*3/uL (ref 0.0–0.5)
EOS%: 2.2 % (ref 0.0–7.0)
HEMATOCRIT: 41.5 % (ref 34.8–46.6)
HGB: 13.9 g/dL (ref 11.6–15.9)
LYMPH%: 23.9 % (ref 14.0–49.7)
MCH: 29.5 pg (ref 25.1–34.0)
MCHC: 33.4 g/dL (ref 31.5–36.0)
MCV: 88.2 fL (ref 79.5–101.0)
MONO#: 0.4 10*3/uL (ref 0.1–0.9)
MONO%: 7.2 % (ref 0.0–14.0)
NEUT#: 3.4 10*3/uL (ref 1.5–6.5)
NEUT%: 65.6 % (ref 38.4–76.8)
PLATELETS: 183 10*3/uL (ref 145–400)
RBC: 4.7 10*6/uL (ref 3.70–5.45)
RDW: 12.8 % (ref 11.2–14.5)
WBC: 5.2 10*3/uL (ref 3.9–10.3)
lymph#: 1.2 10*3/uL (ref 0.9–3.3)

## 2013-09-29 ENCOUNTER — Ambulatory Visit (HOSPITAL_BASED_OUTPATIENT_CLINIC_OR_DEPARTMENT_OTHER): Payer: BC Managed Care – PPO | Admitting: Oncology

## 2013-09-29 VITALS — BP 125/78 | HR 90 | Temp 98.2°F | Resp 18 | Ht 65.0 in | Wt 156.6 lb

## 2013-09-29 DIAGNOSIS — C50412 Malignant neoplasm of upper-outer quadrant of left female breast: Secondary | ICD-10-CM

## 2013-09-29 DIAGNOSIS — N8500 Endometrial hyperplasia, unspecified: Secondary | ICD-10-CM

## 2013-09-29 DIAGNOSIS — C50419 Malignant neoplasm of upper-outer quadrant of unspecified female breast: Secondary | ICD-10-CM

## 2013-09-29 DIAGNOSIS — Z170 Estrogen receptor positive status [ER+]: Secondary | ICD-10-CM

## 2013-09-29 DIAGNOSIS — Z9010 Acquired absence of unspecified breast and nipple: Secondary | ICD-10-CM

## 2013-09-29 NOTE — Progress Notes (Signed)
Erin [name]  MR#: [id number]    PCP: Drema Pry, DO GYN: Thomes Cake SU: OTHER MD: Crista Luria, Ulice Dash Pyrtle  CC: breast cancer stage II CURRENT THERAPY: anti estrogens   History of present illness:  From the original intent note 04/07/2011:  Ms. Erin [name] had routine screening mammography December of 2009 at the [facility] in [place], [place], showing an increasing asymmetry in her left breast. She was recalled for additional views December 29. She was noted to have heterogeneously dense breasts. It was a 5 cm area of architectural distortion in the lateral aspect of the left breast with no associated calcifications. There was a second lesion medial to this. Ultrasound showed a highly suspicious hypoechoic irregularly marginated mass measuring 3 cm at the 2:30 position 5 cm from the nipple. This was palpable to the mammographer. The second area in question I measured 7 mm and a third lesion was noted measuring 5 mm. Some left axillary lymph nodes were morphologically normal.  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 Bilateral breast MRIs were performed 05/04/2008 and showed, in the left breast, a large lobulated mass measuring up to 4.3 cm, and including both of the apparently separate masses the previously biopsied. In the right breast there were 2 indeterminate lesions. These were evaluated further with breast specific gamma imaging performed January 14 in both lesions were negative. The lesion in the left breast was markedly abnormal.  Given this complex history and with the background of significant breast density, the patient opted for bilateral mastectomies with left sentinel lymph node sampling. This was performed of 06/27/2008 and showed([id number]. [id number]) in the right breast, no malignancy. In the left breast there was a 3.2  cm invasive ductal carcinoma, grade 1, focally extending to the anterior margin of the lower outer quadrant. There was extensive angiolymphatic invasion, but both sentinel lymph nodes on the left were negative.  The patient received left-sided postmastectomy radiation including of the left chest wall and left supraclavicular fossa to a total dose of 50.4 Gy plus a 10 Gy scar boost. She had an Oncotype DX recurrence score of 17, further discussed below. She decided to forego reconstruction. She was tested for BRCA1 and 2 and was found to be negative. Given her overall prognosis, she did not receive radiation, but started tamoxifen February of 2010, with good tolerance.   Her subsequent history is as detailed below.  Interval history: Erin [name] returns for followup of her breast cancer. Interval history is significant for her undergoing a transvaginal ultrasound 07/19/2013 for what appeared to be fibroids. The endometrial thickness was 15.4 mm. Subsequent hysteroscopy with resection of endometrial polyps and D&C 09/16/2013 was negative, with the pathology showing benign endometrial polyps, without atypia, hyperplasia, or malignancy ([id number]). Because of these issues the patient went off tamoxifen as of 09/10/2013. [name] is here today to discuss alternatives.  Review of systems:   She is still having a little discharge after the procedure but that is almost entirely resolved. She continues to have hot flashes. Her periods are irregular. She had a period in October, none in November (this is when she started Synthroid). She had periods in December and January but not in February. She then had periods in April and May, with the next one currently due. Aside from these issues, she is very active physically, is working full-time, and a  detailed review of systems today was otherwise entirely negative.  Past medical history:      Past Medical History  Diagnosis Date  . Breast cancer 2010  . Hypertension    . Allergic rhinitis due to pollen   . Osteopenia   . Hypothyroidism   Diverticulosis  Past surgical history:      Past Surgical History  Procedure Laterality Date  . Mastectomy  2010    bilateral  . Tonsillectomy    . Right knee arthroscopy  2008  . Inguinal hernia repair      right side as infant  . Wisdom tooth extraction    . Cyst removed      left lower jaw - at age 28 yr  . Dilatation & currettage/hysteroscopy with resectocope N/A 09/16/2013    Procedure: DILATATION & CURETTAGE/HYSTEROSCOPY WITH RESECTOCOPE;  Surgeon: Princess Bruins, MD;  Location: [facility];  Service: Gynecology;  Laterality: N/A;  1 hr.    Family history:  The patient's mother died in [id number] from complications of lung cancer. The patient has not been in touch with her father her for approximately 30 years. She had no sisters, 1 brother, who is in good health. There is no breast or ovarian cancer in the family to her knowledge.  Gynecologic history: GX P0, menarche at around age 35, the patient continued to have periods despite being on tamoxifen, although more irregularly.  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. She lives alone and has no pets.    ADVANCED DIRECTIVES: not in place  Health maintenance:       History  Substance Use Topics  . Smoking status: Never Smoker   . Smokeless tobacco: Never Used  . Alcohol Use: Yes     Comment: rare      Colonoscopy: December 2013  PAP: Feb 2011  Bone density: 06/2009, nl  Cholesterol: "good"    Allergies:     Allergies  Allergen Reactions  . Adhesive [Tape] Rash    Medications:      Current Outpatient Prescriptions  Medication Sig Dispense Refill  . calcium carbonate (OS-CAL) 600 MG TABS Take 600 mg by mouth 2 (two) times daily with a meal.      . Cholecalciferol (VITAMIN D3) 2000 UNITS capsule Take 2,000 Units by mouth daily.        Marland Kitchen levothyroxine (SYNTHROID, LEVOTHROID) 50 MCG tablet Take 1 tablet (50 mcg total) by mouth daily  before breakfast.  90 tablet  1  . loratadine (CLARITIN) 10 MG tablet Take 10 mg by mouth daily.       Marland Kitchen losartan (COZAAR) 25 MG tablet Take 1 tablet (25 mg total) by mouth daily.  90 tablet  3  . oxyCODONE-acetaminophen (PERCOCET) 7.5-325 MG per tablet Take 1 tablet by mouth every 4 (four) hours as needed for pain.  30 tablet  0  . Polyethyl Glycol-Propyl Glycol (SYSTANE OP) Apply 1 drop to eye as needed (for allergies).      . sodium chloride (OCEAN) 0.65 % SOLN nasal spray Place 1 spray into both nostrils as needed for congestion.      . tamoxifen (NOLVADEX) 20 MG tablet Take 1 tablet (20 mg total) by mouth daily.  90 tablet  12   No current facility-administered medications for this visit.    Physical exam:  Middle-aged white woman who appears well    Filed Vitals:   09/29/13 1611  BP: 125/78  Pulse: 90  Temp: 98.2 F (36.8  C)  Resp: 18     Body mass index is 26.06 kg/(m^2).   ECOG performance status: 0  Sclerae unicteric, pupils round and reactive Oropharynx clear and moist No cervical or supraclavicular adenopathy Lungs no rales or rhonchi Heart regular rate and rhythm Abd soft, nontender, positive bowel sounds MSK no focal spinal tenderness, no peripheral edema Neuro: nonfocal, well oriented, appropriate affect Breasts: Status post bilateral mastectomies. On the left she has some telangiectasias secondary to the radiation. Both axillae are benign  Lab results:            Chemistry      Component Value Date/Time   NA 137 09/27/2013 0852   NA 138 08/23/2013 0955   K 4.3 09/27/2013 0852   K 4.1 08/23/2013 0955   CL 101 08/23/2013 0955   CL 105 08/10/2012 0944   CO2 25 09/27/2013 0852   CO2 27 08/23/2013 0955   BUN 12.9 09/27/2013 0852   BUN 16 08/23/2013 0955   CREATININE 0.8 09/27/2013 0852   CREATININE 0.88 08/23/2013 0955      Component Value Date/Time   CALCIUM 8.9 09/27/2013 0852   CALCIUM 9.2 08/23/2013 0955   ALKPHOS 51 09/27/2013 0852   ALKPHOS 43 01/07/2013 0911   AST 16  09/27/2013 0852   AST 19 01/07/2013 0911   ALT 10 09/27/2013 0852   ALT 12 01/07/2013 0911   BILITOT 0.72 09/27/2013 0852   BILITOT 0.7 01/07/2013 0911         Lab Results  Component Value Date   WBC 5.2 09/27/2013   HGB 13.9 09/27/2013   HCT 41.5 09/27/2013   MCV 88.2 09/27/2013   PLT 183 09/27/2013   NEUTROABS 3.4 09/27/2013    Studies:     DEXA scan April 2013 shows minimal osteopenia (T score -1.2)  Assessment: 52 y.o.  BRCA negative [place] woman ,  (1) status post bilateral mastectomies January 2010 for a left-sided T2 N0 (stage IIA) invasive ductal carcinoma, grade 1, strongly estrogen and progesterone receptor positive, HER-2 nonamplified,   (2) Oncotype DX recurrence score of 17, predicting a 10-12% distant recurrence risk over 10 years if her only adjuvant treatment is tamoxifen for 5 years  (3) status post left postmastectomy radiation,   (4) on tamoxifen starting February of 2010, interrupted may 2015 because of endometrial hyperplasia  Plan: Erin [name] has completed 5 years of tamoxifen. One option is to simply stop now. However we now know that we can obtain and additional 3% risk reduction by either continuing tamoxifen another 5 years, or switching to an aromatase inhibitor for 2 years.  We would prefer not to go back to tamoxifen given the problems with endometrial hyperplasia. On the other hand she is still menstruating, even if it regularly. Accordingly she does not qualify at this point for aromatase inhibitors.  We discussed several choices. She did have bilateral salpingo-oophorectomy. There is no indication for that however: High risk of ovarian cancer is not increased. We could "turn off" her ovaries with goserelin monthly. This would plunger into sudden menopause. We discussed the multiple symptoms she could expect under those circumstances.  Another option, which is what we decided on, is simply to not resume tamoxifen and also not start aromatase inhibitors at this  point. We have good data that even if there is a break of a few years between stopping tamoxifen and starting an aromatase inhibitor, there is still a significant risk reduction. Accordingly there is no hurry to get started.  What we decided to do, is to check her estradiol, L H and [place] levels before next visit here, which will be in November. I expect she will still be perimenopausal, and in that case we will simply see her again a year later and repeat the tests until she is clearly postmenopausal, at which time we will consider going on an aromatase inhibitor for 2 years.  Erin [name] has a good understanding of this overall plan. She agrees with it. She knows the goal of treatment in her case is cure. She will call with any problems that may develop before her next visit here.  [name],[name] 09/29/2013

## 2013-10-02 DIAGNOSIS — N8500 Endometrial hyperplasia, unspecified: Secondary | ICD-10-CM | POA: Insufficient documentation

## 2013-10-04 ENCOUNTER — Ambulatory Visit: Payer: BC Managed Care – PPO | Admitting: Oncology

## 2013-10-04 NOTE — Addendum Note (Signed)
Addended by: Laureen Abrahams on: 10/04/2013 06:05 PM   Modules accepted: Orders, Medications

## 2013-10-05 ENCOUNTER — Ambulatory Visit: Payer: BC Managed Care – PPO | Admitting: Oncology

## 2013-10-12 ENCOUNTER — Telehealth: Payer: Self-pay | Admitting: Oncology

## 2013-10-12 NOTE — Telephone Encounter (Signed)
r/s pt per GM-Gm on PAL-will call pt to adv of time & date of new appt

## 2013-10-13 ENCOUNTER — Telehealth: Payer: Self-pay | Admitting: Oncology

## 2013-10-13 NOTE — Telephone Encounter (Signed)
cld & left message w/pt VM to adv of r/s time & date

## 2013-11-08 ENCOUNTER — Other Ambulatory Visit: Payer: Self-pay | Admitting: Internal Medicine

## 2013-11-09 ENCOUNTER — Ambulatory Visit: Payer: BC Managed Care – PPO

## 2013-11-09 ENCOUNTER — Telehealth: Payer: Self-pay | Admitting: *Deleted

## 2013-11-09 ENCOUNTER — Telehealth: Payer: Self-pay | Admitting: Oncology

## 2013-11-09 NOTE — Telephone Encounter (Signed)
pt cld statingher labs s/b 2 weeks ahead of appt-adv it was r/s [place] as prev sch. Adv would need to spk w/Nurse to inquire. Trans to [employer]

## 2013-11-09 NOTE — Telephone Encounter (Signed)
Per patient's call lab appointment rescheduled to allow for lab results to be available for MD appointment in November 2015.

## 2013-11-12 ENCOUNTER — Other Ambulatory Visit (INDEPENDENT_AMBULATORY_CARE_PROVIDER_SITE_OTHER): Payer: BC Managed Care – PPO

## 2013-11-12 DIAGNOSIS — I10 Essential (primary) hypertension: Secondary | ICD-10-CM

## 2013-11-12 DIAGNOSIS — E039 Hypothyroidism, unspecified: Secondary | ICD-10-CM

## 2013-11-12 LAB — BASIC METABOLIC PANEL
BUN: 13 mg/dL (ref 6–23)
CALCIUM: 9 mg/dL (ref 8.4–10.5)
CHLORIDE: 104 meq/L (ref 96–112)
CO2: 27 meq/L (ref 19–32)
Creatinine, Ser: 0.9 mg/dL (ref 0.4–1.2)
GFR: 74.65 mL/min (ref >60.00)
GLUCOSE: 100 mg/dL — AB (ref 70–99)
Potassium: 3.8 mEq/L (ref 3.5–5.1)
SODIUM: 136 meq/L (ref 135–145)

## 2013-11-12 LAB — TSH: TSH: 5.27 u[IU]/mL — ABNORMAL HIGH (ref 0.35–4.50)

## 2013-11-16 ENCOUNTER — Ambulatory Visit: Payer: BC Managed Care – PPO

## 2013-11-16 ENCOUNTER — Other Ambulatory Visit: Payer: Self-pay | Admitting: *Deleted

## 2013-11-16 MED ORDER — LEVOTHYROXINE SODIUM 75 MCG PO TABS: 75.0000 ug | ORAL_TABLET | Freq: Every day | ORAL | Status: DC

## 2013-11-23 ENCOUNTER — Ambulatory Visit (INDEPENDENT_AMBULATORY_CARE_PROVIDER_SITE_OTHER): Payer: BC Managed Care – PPO | Admitting: Internal Medicine

## 2013-11-23 ENCOUNTER — Encounter: Payer: Self-pay | Admitting: Internal Medicine

## 2013-11-23 VITALS — BP 118/82 | HR 88 | Temp 98.7°F | Ht 65.0 in | Wt 158.0 lb

## 2013-11-23 DIAGNOSIS — M899 Disorder of bone, unspecified: Secondary | ICD-10-CM

## 2013-11-23 DIAGNOSIS — E038 Other specified hypothyroidism: Secondary | ICD-10-CM

## 2013-11-23 DIAGNOSIS — M8580 Other specified disorders of bone density and structure, unspecified site: Secondary | ICD-10-CM

## 2013-11-23 DIAGNOSIS — C50419 Malignant neoplasm of upper-outer quadrant of unspecified female breast: Secondary | ICD-10-CM

## 2013-11-23 DIAGNOSIS — C50412 Malignant neoplasm of upper-outer quadrant of left female breast: Secondary | ICD-10-CM

## 2013-11-23 DIAGNOSIS — I10 Essential (primary) hypertension: Secondary | ICD-10-CM

## 2013-11-23 DIAGNOSIS — M949 Disorder of cartilage, unspecified: Secondary | ICD-10-CM

## 2013-11-23 NOTE — Assessment & Plan Note (Signed)
Well controlled.  No change in medication. BP: 118/82 mmHg

## 2013-11-23 NOTE — Assessment & Plan Note (Signed)
Continue calcium and vitamin D supplementation along with regular weight bearing exercise.  Obtain surveillance DEXA scan.

## 2013-11-23 NOTE — Patient Instructions (Signed)
Please complete the following lab tests before your next follow up appointment: TSH - 244.9 Schedule next office visit as CPX

## 2013-11-23 NOTE — Assessment & Plan Note (Signed)
Followed by oncology.  She was treated with radiation therapy to left chest wall. Patient may eventually develop higher risk for coronary artery disease.  Reassess at next annual physical exam.

## 2013-11-23 NOTE — Assessment & Plan Note (Addendum)
Patient's most recent TSH slightly elevated. Levothyroxine dose increased to 75 mcg. Continue to monitor TSH. Adjust dose accordingly.  Lab Results  Component Value Date   TSH 5.27* 11/12/2013    Unclear whether her thyroid may have been damaged during radiation therapy for breast cancer.

## 2013-11-23 NOTE — Progress Notes (Signed)
Subjective:    Patient ID: Erin [name], female    DOB: 1960/05/20, 53 y.o.   MRN: [id number]  HPI  53 year old white female with history of breast cancer, endometrial hyperplasia hypertension and hypothyroidism for routine followup. Interval medical history-patient seen by gynecologist for endometrial replacement. She underwent biopsy which was unremarkable. However her oncologist discontinued tamoxifen. She has been on adjuvant tamoxifen for 5 yrs. Her oncologist has not decided whether to continue tamoxifen or induce menopause so that an aromatase inhibitor can be started.  Hypothyroidism-patient has been taking levothyroxine 50 mcg once daily. Her most recent TSH was slightly elevated. Her levothyroxine dose increased to 75 mcg. She denies any unusual fatigue. Patient reports taking her thyroid medication person in the morning before any other medications or food.  Hypertension - stable.  Osteopenia - it has been over 2 yrs since her last DEXA scan  Review of Systems Negative for weight change.  Less hot flashes since stopping tamoxifen    Past Medical History  Diagnosis Date  . Breast cancer 2010  . Hypertension   . Allergic rhinitis due to pollen   . Osteopenia   . Hypothyroidism     History   Social History  . Marital Status: Single    Spouse Name: N/A    Number of Children: N/A  . Years of Education: N/A   Occupational History  . Not on file.   Social History Main Topics  . Smoking status: Never Smoker   . Smokeless tobacco: Never Used  . Alcohol Use: Yes     Comment: rare  . Drug Use: No  . Sexual Activity: Yes    Birth Control/ Protection: None   Other Topics Concern  . Not on file   Social History Narrative   She grew up in [place] last 16 yrs in [place]    Past Surgical History  Procedure Laterality Date  . Mastectomy  2010    bilateral  . Tonsillectomy    . Right knee arthroscopy  2008  . Inguinal hernia repair      right side as  infant  . Wisdom tooth extraction    . Cyst removed      left lower jaw - at age 89 yr  . Dilatation & currettage/hysteroscopy with resectocope N/A 09/16/2013    Procedure: DILATATION & CURETTAGE/HYSTEROSCOPY WITH RESECTOCOPE;  Surgeon: Princess Bruins, MD;  Location: [facility];  Service: Gynecology;  Laterality: N/A;  1 hr.    Family History  Problem Relation Age of Onset  . Hypertension Mother   . Cancer Mother     Lung Cancer - long history of tobacco use  . Hypertension Father   . Colon cancer Neg Hx   . Stomach cancer Neg Hx     Allergies  Allergen Reactions  . Adhesive [Tape] Rash    Current Outpatient Prescriptions on File Prior to Visit  Medication Sig Dispense Refill  . calcium carbonate (OS-CAL) 600 MG TABS Take 600 mg by mouth 2 (two) times daily with a meal.      . Cholecalciferol (VITAMIN D3) 2000 UNITS capsule Take 2,000 Units by mouth daily.        Marland Kitchen levothyroxine (SYNTHROID, LEVOTHROID) 75 MCG tablet Take 1 tablet (75 mcg total) by mouth daily.  90 tablet  1  . loratadine (CLARITIN) 10 MG tablet Take 10 mg by mouth daily.       Marland Kitchen losartan (COZAAR) 25 MG tablet Take 1 tablet (25 mg  total) by mouth daily.  90 tablet  3  . Polyethyl Glycol-Propyl Glycol (SYSTANE OP) Apply 1 drop to eye as needed (for allergies).      . sodium chloride (OCEAN) 0.65 % SOLN nasal spray Place 1 spray into both nostrils as needed for congestion.       No current facility-administered medications on file prior to visit.    BP 118/82  Pulse 88  Temp(Src) 98.7 F (37.1 C) (Oral)  Ht 5\' 5"  (1.651 m)  Wt 158 lb (71.668 kg)  BMI 26.29 kg/m2    Objective:   Physical Exam  Constitutional: She is oriented to person, place, and time. She appears well-developed and well-nourished. No distress.  HENT:  Head: Normocephalic and atraumatic.  Cardiovascular: Normal rate, regular rhythm and normal heart sounds.   No murmur heard. Pulmonary/Chest: Effort normal and breath sounds normal. She  has no wheezes.  Musculoskeletal: She exhibits no edema.  Neurological: She is alert and oriented to person, place, and time. No cranial nerve deficit.  Psychiatric: She has a normal mood and affect. Her behavior is normal.          Assessment & Plan:

## 2013-11-23 NOTE — Progress Notes (Signed)
Pre visit review using our clinic review tool, if applicable. No additional management support is needed unless otherwise documented below in the visit note. 

## 2013-11-24 ENCOUNTER — Ambulatory Visit: Payer: BC Managed Care – PPO | Admitting: Internal Medicine

## 2013-11-26 ENCOUNTER — Ambulatory Visit (INDEPENDENT_AMBULATORY_CARE_PROVIDER_SITE_OTHER): Payer: BC Managed Care – PPO

## 2013-11-26 VITALS — BP 118/82 | HR 88 | Resp 16

## 2013-11-26 DIAGNOSIS — M779 Enthesopathy, unspecified: Secondary | ICD-10-CM

## 2013-11-26 DIAGNOSIS — M722 Plantar fascial fibromatosis: Secondary | ICD-10-CM

## 2013-11-26 NOTE — Patient Instructions (Signed)

## 2013-11-26 NOTE — Progress Notes (Signed)
   Subjective:    Patient ID: Erin [name], female    DOB: 1960-06-12, 53 y.o.   MRN: [id number]  HPI Comments: "I just need to pick up my corrected orthotics. I wanted him to make sure they were right this time"     Review of Systems no new findings or systemic changes     Objective:   Physical Exam Lower extremity objective findings unchanged pedal pulses palpable epicritic and proprioceptive sensations intact. Patient's new orthotics are present it is orthotics have a polypropylene shell Spenco top cover and met head the orthotic again has a polypropylene shell and no met pad Spenco top cover however the left wider grind which made the orthotic he'll cup deeper patient is concerned shoe them in that fit into the ball of her casual shoes or slip on shoes. The orthotics were fitted her tennis shoes and a pair clots at this time patient noted that she was able to wear them within the shoes I suggested wearing for the next month or 2 recommend adjusting if in the future she is narrowing of the orthotic to lower the heel cups and to narrow the body orthotic can be done future and time.       Assessment & Plan:  Assessment plantar fasciitis and capsulitis which is response orthotics a new set of orthotics is issued at this time corrections were made and appropriate shell and top cover present this time they may still be too widened additional tapering may be needed in the future we'll followup with the next one to 2 months if additional grinding on the orthotic is needed to lower and taper the orthotic otherwise orthotic actually for the full contour the patient's foot.  Harriet Masson DPM

## 2013-12-09 ENCOUNTER — Ambulatory Visit (INDEPENDENT_AMBULATORY_CARE_PROVIDER_SITE_OTHER)
Admission: RE | Admit: 2013-12-09 | Discharge: 2013-12-09 | Disposition: A | Discharge status: Home / Self Care | Payer: BC Managed Care – PPO | Source: Ambulatory Visit | Attending: Internal Medicine | Admitting: Internal Medicine

## 2013-12-09 DIAGNOSIS — M949 Disorder of cartilage, unspecified: Secondary | ICD-10-CM

## 2013-12-09 DIAGNOSIS — M8580 Other specified disorders of bone density and structure, unspecified site: Secondary | ICD-10-CM

## 2013-12-09 DIAGNOSIS — M899 Disorder of bone, unspecified: Secondary | ICD-10-CM

## 2014-01-14 ENCOUNTER — Ambulatory Visit (INDEPENDENT_AMBULATORY_CARE_PROVIDER_SITE_OTHER): Payer: BC Managed Care – PPO | Admitting: *Deleted

## 2014-01-14 DIAGNOSIS — Z23 Encounter for immunization: Secondary | ICD-10-CM

## 2014-02-08 ENCOUNTER — Other Ambulatory Visit (INDEPENDENT_AMBULATORY_CARE_PROVIDER_SITE_OTHER): Payer: BC Managed Care – PPO

## 2014-02-08 ENCOUNTER — Other Ambulatory Visit: Payer: BC Managed Care – PPO

## 2014-02-08 DIAGNOSIS — Z0000 Encounter for general adult medical examination without abnormal findings: Secondary | ICD-10-CM

## 2014-02-08 LAB — HEPATIC FUNCTION PANEL
ALBUMIN: 3.3 g/dL — AB (ref 3.5–5.2)
ALT: 14 U/L (ref 0–35)
AST: 17 U/L (ref 0–37)
Alkaline Phosphatase: 59 U/L (ref 39–117)
BILIRUBIN TOTAL: 0.9 mg/dL (ref 0.2–1.2)
Bilirubin, Direct: 0.1 mg/dL (ref 0.0–0.3)
Total Protein: 6.9 g/dL (ref 6.0–8.3)

## 2014-02-08 LAB — CBC WITH DIFFERENTIAL/PLATELET
BASOS ABS: 0 10*3/uL (ref 0.0–0.1)
Basophils Relative: 0.6 % (ref 0.0–3.0)
Eosinophils Absolute: 0.1 10*3/uL (ref 0.0–0.7)
Eosinophils Relative: 1.8 % (ref 0.0–5.0)
HEMATOCRIT: 41.7 % (ref 36.0–46.0)
Hemoglobin: 13.6 g/dL (ref 12.0–15.0)
LYMPHS ABS: 1.3 10*3/uL (ref 0.7–4.0)
Lymphocytes Relative: 23.9 % (ref 12.0–46.0)
MCHC: 32.7 g/dL (ref 30.0–36.0)
MCV: 86.1 fl (ref 78.0–100.0)
MONO ABS: 0.5 10*3/uL (ref 0.1–1.0)
MONOS PCT: 9.6 % (ref 3.0–12.0)
NEUTROS ABS: 3.5 10*3/uL (ref 1.4–7.7)
Neutrophils Relative %: 64.1 % (ref 43.0–77.0)
Platelets: 207 10*3/uL (ref 150.0–400.0)
RBC: 4.84 Mil/uL (ref 3.87–5.11)
RDW: 13.6 % (ref 11.5–15.5)
WBC: 5.5 10*3/uL (ref 4.0–10.5)

## 2014-02-08 LAB — BASIC METABOLIC PANEL
BUN: 14 mg/dL (ref 6–23)
CO2: 25 meq/L (ref 19–32)
Calcium: 8.9 mg/dL (ref 8.4–10.5)
Chloride: 105 mEq/L (ref 96–112)
Creatinine, Ser: 0.8 mg/dL (ref 0.4–1.2)
GFR: 81.16 mL/min (ref >60.00)
GLUCOSE: 88 mg/dL (ref 70–99)
POTASSIUM: 4.1 meq/L (ref 3.5–5.1)
Sodium: 137 mEq/L (ref 135–145)

## 2014-02-08 LAB — POCT URINALYSIS DIPSTICK
BILIRUBIN UA: NEGATIVE
Glucose, UA: NEGATIVE
Ketones, UA: NEGATIVE
LEUKOCYTES UA: NEGATIVE
NITRITE UA: NEGATIVE
Protein, UA: NEGATIVE
RBC UA: NEGATIVE
Spec Grav, UA: 1.015
UROBILINOGEN UA: 0.2
pH, UA: 7

## 2014-02-08 LAB — LIPID PANEL
CHOL/HDL RATIO: 3
Cholesterol: 143 mg/dL (ref 0–200)
HDL: 47.3 mg/dL (ref >39.00)
LDL Cholesterol: 84 mg/dL (ref 0–99)
NonHDL: 95.7
TRIGLYCERIDES: 57 mg/dL (ref 0.0–149.0)
VLDL: 11.4 mg/dL (ref 0.0–40.0)

## 2014-02-08 LAB — TSH: TSH: 1.08 u[IU]/mL (ref 0.35–4.50)

## 2014-02-15 ENCOUNTER — Encounter: Payer: BC Managed Care – PPO | Admitting: Internal Medicine

## 2014-02-15 ENCOUNTER — Other Ambulatory Visit: Payer: BC Managed Care – PPO

## 2014-02-16 ENCOUNTER — Encounter: Payer: Self-pay | Admitting: Internal Medicine

## 2014-02-16 ENCOUNTER — Ambulatory Visit (INDEPENDENT_AMBULATORY_CARE_PROVIDER_SITE_OTHER): Payer: BC Managed Care – PPO | Admitting: Internal Medicine

## 2014-02-16 VITALS — BP 118/72 | HR 89 | Temp 98.4°F | Ht 65.0 in | Wt 159.0 lb

## 2014-02-16 DIAGNOSIS — Z0000 Encounter for general adult medical examination without abnormal findings: Secondary | ICD-10-CM

## 2014-02-16 MED ORDER — ACYCLOVIR 5 % EX OINT: 1.0000 "application " | TOPICAL_OINTMENT | CUTANEOUS | Status: DC

## 2014-02-16 MED ORDER — LOSARTAN POTASSIUM 25 MG PO TABS: 25.0000 mg | ORAL_TABLET | Freq: Every day | ORAL | Status: DC

## 2014-02-16 MED ORDER — LEVOTHYROXINE SODIUM 75 MCG PO TABS: 75.0000 ug | ORAL_TABLET | Freq: Every day | ORAL | Status: DC

## 2014-02-16 NOTE — Patient Instructions (Signed)
Please complete the following lab tests before your next follow up appointment: BMET - 401.9 TSH - 244.9 

## 2014-02-16 NOTE — Progress Notes (Signed)
Pre visit review using our clinic review tool, if applicable. No additional management support is needed unless otherwise documented below in the visit note. 

## 2014-02-16 NOTE — Assessment & Plan Note (Signed)
Reviewed adult health maintenance protocols.  Patient's bone density was stable. She has T score of -1.2 left hip. Patient advised to continue weightbearing exercises and take vitamin D supplementation. There was concern over whether there was benefit to taking calcium supplementation. Patient consumes dairy products on a regular basis. Patient will discontinue her calcium supplementation. Patient updated with influenza vaccine.

## 2014-02-16 NOTE — Progress Notes (Signed)
Subjective:    Patient ID: Erin [name], female    DOB: Feb 08, 1961, 53 y.o.   MRN: [id number]  HPI  53 year old white female with history of left-sided breast cancer, status post mastectomy and radiation therapy for routine physical.  Patient denies any significant medical history.  Health maintenance reviewed items reviewed. She had recent repeat bone density scan. She has mild osteopenia with a T score of -1.2 in her left hip.  Review of Systems     Constitutional: Negative for activity change, appetite change and unexpected weight change.  Eyes: Negative for visual disturbance.  Respiratory: Negative for cough, chest tightness and shortness of breath.   Cardiovascular: Negative for chest pain.  Genitourinary: Negative for difficulty urinating.  Neurological: Negative for headaches.  Gastrointestinal: Negative for abdominal pain, heartburn melena or hematochezia Psych: Negative for depression or anxiety Endo:  No polyuria or polydypsia     Past Medical History  Diagnosis Date  . Breast cancer 2010  . Hypertension   . Allergic rhinitis due to pollen   . Osteopenia   . Hypothyroidism     History   Social History  . Marital Status: Single    Spouse Name: N/A    Number of Children: N/A  . Years of Education: N/A   Occupational History  . Not on file.   Social History Main Topics  . Smoking status: Never Smoker   . Smokeless tobacco: Never Used  . Alcohol Use: Yes     Comment: rare  . Drug Use: No  . Sexual Activity: Yes    Birth Control/ Protection: None   Other Topics Concern  . Not on file   Social History Narrative   She grew up in [place] last 16 yrs in [place]    Past Surgical History  Procedure Laterality Date  . Mastectomy  2010    bilateral  . Tonsillectomy    . Right knee arthroscopy  2008  . Inguinal hernia repair      right side as infant  . Wisdom tooth extraction    . Cyst removed      left lower jaw - at age 56 yr  .  Dilatation & currettage/hysteroscopy with resectocope N/A 09/16/2013    Procedure: DILATATION & CURETTAGE/HYSTEROSCOPY WITH RESECTOCOPE;  Surgeon: Princess Bruins, MD;  Location: [facility];  Service: Gynecology;  Laterality: N/A;  1 hr.    Family History  Problem Relation Age of Onset  . Hypertension Mother   . Cancer Mother     Lung Cancer - long history of tobacco use  . Hypertension Father   . Colon cancer Neg Hx   . Stomach cancer Neg Hx     Allergies  Allergen Reactions  . Adhesive [Tape] Rash    Current Outpatient Prescriptions on File Prior to Visit  Medication Sig Dispense Refill  . Cholecalciferol (VITAMIN D3) 2000 UNITS capsule Take 2,000 Units by mouth daily.        Marland Kitchen loratadine (CLARITIN) 10 MG tablet Take 10 mg by mouth daily.       Vladimir Faster Glycol-Propyl Glycol (SYSTANE OP) Apply 1 drop to eye as needed (for allergies).      . sodium chloride (OCEAN) 0.65 % SOLN nasal spray Place 1 spray into both nostrils as needed for congestion.       No current facility-administered medications on file prior to visit.    BP 118/72  Pulse 89  Temp(Src) 98.4 F (36.9 C) (Oral)  Ht  5\' 5"  (1.651 m)  Wt 159 lb (72.122 kg)  BMI 26.46 kg/m2    Objective:   Physical Exam  Constitutional: She is oriented to person, place, and time. She appears well-developed and well-nourished.  HENT:  Head: Normocephalic and atraumatic.  Right Ear: External ear normal.  Left Ear: External ear normal.  Eyes: Conjunctivae and EOM are normal. Pupils are equal, round, and reactive to light.  Neck: Neck supple.  No carotid bruit  Cardiovascular: Normal rate, regular rhythm and normal heart sounds.   Pulmonary/Chest: Effort normal and breath sounds normal. She has no wheezes.  Musculoskeletal: She exhibits no edema.  Lymphadenopathy:    She has no cervical adenopathy.  Neurological: She is alert and oriented to person, place, and time. No cranial nerve deficit.  Skin: Skin is warm and dry.    Psychiatric: She has a normal mood and affect. Her behavior is normal.          Assessment & Plan:

## 2014-02-22 ENCOUNTER — Ambulatory Visit: Payer: BC Managed Care – PPO | Admitting: Oncology

## 2014-03-02 ENCOUNTER — Other Ambulatory Visit (HOSPITAL_BASED_OUTPATIENT_CLINIC_OR_DEPARTMENT_OTHER): Payer: BC Managed Care – PPO

## 2014-03-02 DIAGNOSIS — Z853 Personal history of malignant neoplasm of breast: Secondary | ICD-10-CM

## 2014-03-02 DIAGNOSIS — C50919 Malignant neoplasm of unspecified site of unspecified female breast: Secondary | ICD-10-CM

## 2014-03-02 LAB — CBC WITH DIFFERENTIAL/PLATELET
BASO%: 1 % (ref 0.0–2.0)
Basophils Absolute: 0 10*3/uL (ref 0.0–0.1)
EOS%: 1.8 % (ref 0.0–7.0)
Eosinophils Absolute: 0.1 10*3/uL (ref 0.0–0.5)
HCT: 41.3 % (ref 34.8–46.6)
HGB: 13.5 g/dL (ref 11.6–15.9)
LYMPH#: 1 10*3/uL (ref 0.9–3.3)
LYMPH%: 21.7 % (ref 14.0–49.7)
MCH: 28.1 pg (ref 25.1–34.0)
MCHC: 32.7 g/dL (ref 31.5–36.0)
MCV: 86 fL (ref 79.5–101.0)
MONO#: 0.4 10*3/uL (ref 0.1–0.9)
MONO%: 7.9 % (ref 0.0–14.0)
NEUT#: 3.3 10*3/uL (ref 1.5–6.5)
NEUT%: 67.6 % (ref 38.4–76.8)
Platelets: 216 10*3/uL (ref 145–400)
RBC: 4.8 10*6/uL (ref 3.70–5.45)
RDW: 13.5 % (ref 11.2–14.5)
WBC: 4.8 10*3/uL (ref 3.9–10.3)

## 2014-03-02 LAB — COMPREHENSIVE METABOLIC PANEL (CC13)
ALT: 16 U/L (ref 0–55)
AST: 16 U/L (ref 5–34)
Albumin: 3.6 g/dL (ref 3.5–5.0)
Alkaline Phosphatase: 64 U/L (ref 40–150)
Anion Gap: 6 mEq/L (ref 3–11)
BUN: 15.4 mg/dL (ref 7.0–26.0)
CALCIUM: 9 mg/dL (ref 8.4–10.4)
CHLORIDE: 107 meq/L (ref 98–109)
CO2: 24 mEq/L (ref 22–29)
Creatinine: 0.8 mg/dL (ref 0.6–1.1)
Glucose: 90 mg/dl (ref 70–140)
POTASSIUM: 4 meq/L (ref 3.5–5.1)
Sodium: 137 mEq/L (ref 136–145)
Total Bilirubin: 0.98 mg/dL (ref 0.20–1.20)
Total Protein: 6.6 g/dL (ref 6.4–8.3)

## 2014-03-02 LAB — FOLLICLE STIMULATING HORMONE: FSH: 3 m[IU]/mL

## 2014-03-02 LAB — LUTEINIZING HORMONE: LH: 4.6 m[IU]/mL

## 2014-03-05 LAB — ESTRADIOL, ULTRA SENS: ESTRADIOL, ULTRA SENSITIVE: 153 pg/mL

## 2014-03-08 ENCOUNTER — Other Ambulatory Visit: Payer: BC Managed Care – PPO

## 2014-03-15 ENCOUNTER — Telehealth: Payer: Self-pay | Admitting: Oncology

## 2014-03-15 ENCOUNTER — Ambulatory Visit (HOSPITAL_BASED_OUTPATIENT_CLINIC_OR_DEPARTMENT_OTHER): Payer: BC Managed Care – PPO | Admitting: Oncology

## 2014-03-15 VITALS — BP 154/66 | HR 83 | Temp 98.0°F | Resp 18 | Ht 65.0 in | Wt 160.6 lb

## 2014-03-15 DIAGNOSIS — M8580 Other specified disorders of bone density and structure, unspecified site: Secondary | ICD-10-CM

## 2014-03-15 DIAGNOSIS — C50412 Malignant neoplasm of upper-outer quadrant of left female breast: Secondary | ICD-10-CM

## 2014-03-15 DIAGNOSIS — N8500 Endometrial hyperplasia, unspecified: Secondary | ICD-10-CM

## 2014-03-15 NOTE — Telephone Encounter (Signed)
per pof to sch pt appt-gave pt copy of sch °

## 2014-03-15 NOTE — Progress Notes (Signed)
Erin [name]  MR#: [id number]    PCP: Drema Pry, DO GYN: Thomes Cake SU: OTHER MD: Crista Luria, Zenovia Jarred, [place]  CC: breast cancer stage II CURRENT THERAPY: observation   History of present illness:  From the original intent note 04/07/2011:  Ms. Erin [name] had routine screening mammography December of 2009 at the [facility] in [place], [place], showing an increasing asymmetry in her left breast. She was recalled for additional views December 29. She was noted to have heterogeneously dense breasts. It was a 5 cm area of architectural distortion in the lateral aspect of the left breast with no associated calcifications. There was a second lesion medial to this. Ultrasound showed a highly suspicious hypoechoic irregularly marginated mass measuring 3 cm at the 2:30 position 5 cm from the nipple. This was palpable to the mammographer. The second area in question I measured 7 mm and a third lesion was noted measuring 5 mm. Some left axillary lymph nodes were morphologically normal.  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 Bilateral breast MRIs were performed 05/04/2008 and showed, in the left breast, a large lobulated mass measuring up to 4.3 cm, and including both of the apparently separate masses the previously biopsied. In the right breast there were 2 indeterminate lesions. These were evaluated further with breast specific gamma imaging performed January 14 in both lesions were negative. The lesion in the left breast was markedly abnormal.  Given this complex history and with the background of significant breast density, the patient opted for bilateral mastectomies with left sentinel lymph node sampling. This was performed of 06/27/2008 and showed(S. [id number]) in the right breast, no malignancy. In the left breast there was  a 3.2 cm invasive ductal carcinoma, grade 1, focally extending to the anterior margin of the lower outer quadrant. There was extensive angiolymphatic invasion, but both sentinel lymph nodes on the left were negative.  The patient received left-sided postmastectomy radiation including of the left chest wall and left supraclavicular fossa to a total dose of 50.4 Gy plus a 10 Gy scar boost. She had an Oncotype DX recurrence score of 17, further discussed below. She decided to forego reconstruction. She was tested for BRCA1 and 2 and was found to be negative. Given her overall prognosis, she did not receive radiation, but started tamoxifen February of 2010, with good tolerance.   Her subsequent history is as detailed below.  Interval history: Erin [name] returns for followup of her breast cancer. She went off tamoxifen may 2015. She has not noted any difference in any symptoms. After her hysteroscopy in May she did not have a period until August, but since then she has had a regular period every 21-27 days (normally her periods come around 30 days apart). Currently her periods last about 3 days plus a couple of days of spotting. They are relatively light as compared to prior.  Review of systems:   She is concerned about gaining weight. She is doing quite a bit of traveling for her job and therefore cannot exercise or eat a correct diet. She has noted what may be a change on the skin of her left breast breast laterally and wonders if there is some scar tissue there or if this is something to worry about. She wonders if she really needs to take any calcium. Her thyroid has needed adjusting and that  might be another reason she feels her weight may have gone up. A detailed review of systems today was otherwise stable   Past medical history:      Past Medical History  Diagnosis Date  . Breast cancer 2010  . Hypertension   . Allergic rhinitis due to pollen   . Osteopenia   . Hypothyroidism    Diverticulosis  Past surgical history:      Past Surgical History  Procedure Laterality Date  . Mastectomy  2010    bilateral  . Tonsillectomy    . Right knee arthroscopy  2008  . Inguinal hernia repair      right side as infant  . Wisdom tooth extraction    . Cyst removed      left lower jaw - at age 33 yr  . Dilatation & currettage/hysteroscopy with resectocope N/A 09/16/2013    Procedure: DILATATION & CURETTAGE/HYSTEROSCOPY WITH RESECTOCOPE;  Surgeon: Princess Bruins, MD;  Location: [facility];  Service: Gynecology;  Laterality: N/A;  1 hr.    Family history:  The patient's mother died in 5397 from complications of lung cancer. The patient has not been in touch with her father her for approximately 30 years. She had no sisters, 1 brother, who is in good health. There is no breast or ovarian cancer in the family to her knowledge.  Gynecologic history: GX P0, menarche at around age 59, the patient continued to have periods despite being on tamoxifen, although more irregularly. 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. She lives alone and has no pets.     ADVANCED DIRECTIVES: not in place  Health maintenance:       History  Substance Use Topics  . Smoking status: Never Smoker   . Smokeless tobacco: Never Used  . Alcohol Use: Yes     Comment: rare      Colonoscopy: December 2013 Collene Mares)  PAP: Feb 2011  Bone density: 06/2009, nl  Cholesterol: "good"    Allergies:     Allergies  Allergen Reactions  . Adhesive [Tape] Rash    Medications:      Current Outpatient Prescriptions  Medication Sig Dispense Refill  . acyclovir ointment (ZOVIRAX) 5 % Apply 1 application topically every 3 (three) hours. 5 g 5  . Cholecalciferol (VITAMIN D3) 2000 UNITS capsule Take 2,000 Units by mouth daily.      Marland Kitchen levothyroxine (SYNTHROID, LEVOTHROID) 75 MCG tablet Take 1 tablet (75 mcg total) by mouth daily. 90 tablet 1  . loratadine (CLARITIN) 10 MG  tablet Take 10 mg by mouth daily.     Marland Kitchen losartan (COZAAR) 25 MG tablet Take 1 tablet (25 mg total) by mouth daily. 90 tablet 3  . Polyethyl Glycol-Propyl Glycol (SYSTANE OP) Apply 1 drop to eye as needed (for allergies).    . sodium chloride (OCEAN) 0.65 % SOLN nasal spray Place 1 spray into both nostrils as needed for congestion.     No current facility-administered medications for this visit.    Physical exam:  Middle-aged white woman in no acute distress    Filed Vitals:   03/15/14 1611  BP: 154/66  Pulse: 83  Temp: 98 F (36.7 C)  Resp: 18     Body mass index is 26.73 kg/(m^2).     ECOG performance status: 0  Sclerae unicteric, pupils round and equal Oropharynx clear, teeth in good repair No cervical or supraclavicular adenopathy Lungs no rales or rhonchi Heart regular  rate and rhythm Abd soft, nontender, positive bowel sounds MSK no focal spinal tenderness, no peripheral edema Neuro: nonfocal, well oriented, appropriate affect Breasts: Status post bilateral mastectomies. On the left she has some telangiectasias secondary to the radiation. There is an area which appears to be a mild keloid formation that is of concern to her. It is pink, soft, and very superficial. This is photographed below.. Both axillae are benign     Lab results:            Chemistry      Component Value Date/Time   NA 137 03/02/2014 0948   NA 137 02/08/2014 0834   K 4.0 03/02/2014 0948   K 4.1 02/08/2014 0834   CL 105 02/08/2014 0834   CL 105 08/10/2012 0944   CO2 24 03/02/2014 0948   CO2 25 02/08/2014 0834   BUN 15.4 03/02/2014 0948   BUN 14 02/08/2014 0834   CREATININE 0.8 03/02/2014 0948   CREATININE 0.8 02/08/2014 0834      Component Value Date/Time   CALCIUM 9.0 03/02/2014 0948   CALCIUM 8.9 02/08/2014 0834   ALKPHOS 64 03/02/2014 0948   ALKPHOS 59 02/08/2014 0834   AST 16 03/02/2014 0948   AST 17 02/08/2014 0834   ALT 16 03/02/2014 0948   ALT 14 02/08/2014 0834   BILITOT  0.98 03/02/2014 0948   BILITOT 0.9 02/08/2014 0834         Lab Results  Component Value Date   WBC 4.8 03/02/2014   HGB 13.5 03/02/2014   HCT 41.3 03/02/2014   MCV 86.0 03/02/2014   PLT 216 03/02/2014   NEUTROABS 3.3 03/02/2014    Studies:     DEXA scan April 2013 shows minimal osteopenia (T score -1.2)  Assessment: 53 y.o.  BRCA negative [place] woman ,  (1) status post bilateral mastectomies January 2010 for a left-sided T2 N0 (stage IIA) invasive ductal carcinoma, grade 1, strongly estrogen and progesterone receptor positive, HER-2 nonamplified,   (2) Oncotype DX recurrence score of 17, predicting a 10-12% distant recurrence risk over 10 years if her only adjuvant treatment is tamoxifen for 5 years  (3) status post left postmastectomy radiation,   (4) on tamoxifen starting February of 2010, stopped May 2015 because of endometrial hyperplasia  (5) osteopenia-- T score -1.2 on repeated bone density scans 09/24/2011 and 12/12/2013  Plan: Erin [name] has completed 5 years of tamoxifen. One option is to simply stop now. However we now know that we can obtain and additional 3% risk reduction by either continuing tamoxifen another 5 years, or switching to an aromatase inhibitor for 2 years.  We would prefer not to go back to tamoxifen given the problems with endometrial hyperplasia. On the other hand she is still menstruating, even if it regularly. Accordingly she does not qualify at this point for aromatase inhibitors.  We discussed several choices. She could have bilateral salpingo-oophorectomy. There is no indication for that however: Her risk of ovarian cancer is not increased. We could "turn off" her ovaries with goserelin monthly. This would plunge her into sudden menopause. We discussed the multiple symptoms she could expect under those circumstances.  Another option, which is what we decided on, is simply to not resume tamoxifen and also not start aromatase inhibitors at this  point. We have good data that even if there is a break of a few years between stopping tamoxifen and starting an aromatase inhibitor, there is still a significant risk reduction. Accordingly there is no hurry  to get started.  What we decided to do, is to check her estradiol, L H and [place] levels before next visit here, which will be in July 2016. I expect she will still be perimenopausal, and in that case we will simply see her again a year later and repeat the tests until she is clearly postmenopausal, at which time we will consider going on an aromatase inhibitor for at least 2 years. We will also follow the area in her lateral scar that is of concern to her.  Erin [name] has a good understanding of this overall plan. She agrees with it. She knows the goal of treatment in her case is cure. She will call with any problems that may develop before her next visit here.  Sael Furches C 03/15/2014

## 2014-03-23 NOTE — Addendum Note (Signed)
Addended by: Laureen Abrahams on: 03/23/2014 07:04 PM   Modules accepted: Medications

## 2014-04-21 ENCOUNTER — Other Ambulatory Visit: Payer: Self-pay | Admitting: Dermatology

## 2014-04-22 HISTORY — PX: OOPHORECTOMY: SHX86

## 2014-04-29 ENCOUNTER — Other Ambulatory Visit: Payer: Self-pay | Admitting: *Deleted

## 2014-05-01 ENCOUNTER — Other Ambulatory Visit: Payer: Self-pay | Admitting: Oncology

## 2014-05-01 DIAGNOSIS — C50912 Malignant neoplasm of unspecified site of left female breast: Secondary | ICD-10-CM

## 2014-05-02 ENCOUNTER — Telehealth: Payer: Self-pay | Admitting: Oncology

## 2014-05-02 ENCOUNTER — Other Ambulatory Visit: Payer: Self-pay | Admitting: Emergency Medicine

## 2014-05-02 NOTE — Telephone Encounter (Signed)
pt cld to r/s time for lab on 1/15-pt aware of updated time

## 2014-05-02 NOTE — Telephone Encounter (Signed)
, °

## 2014-05-04 ENCOUNTER — Other Ambulatory Visit: Payer: Self-pay | Admitting: *Deleted

## 2014-05-05 ENCOUNTER — Other Ambulatory Visit: Payer: Self-pay | Admitting: *Deleted

## 2014-05-06 ENCOUNTER — Other Ambulatory Visit: Payer: BC Managed Care – PPO

## 2014-05-06 ENCOUNTER — Other Ambulatory Visit (HOSPITAL_BASED_OUTPATIENT_CLINIC_OR_DEPARTMENT_OTHER): Payer: BC Managed Care – PPO

## 2014-05-06 ENCOUNTER — Ambulatory Visit (HOSPITAL_BASED_OUTPATIENT_CLINIC_OR_DEPARTMENT_OTHER): Payer: BC Managed Care – PPO | Admitting: Oncology

## 2014-05-06 ENCOUNTER — Telehealth: Payer: Self-pay

## 2014-05-06 VITALS — BP 140/86 | HR 86 | Temp 98.0°F | Resp 18 | Ht 65.0 in | Wt 154.4 lb

## 2014-05-06 DIAGNOSIS — N8500 Endometrial hyperplasia, unspecified: Secondary | ICD-10-CM

## 2014-05-06 DIAGNOSIS — C50412 Malignant neoplasm of upper-outer quadrant of left female breast: Secondary | ICD-10-CM

## 2014-05-06 DIAGNOSIS — M8580 Other specified disorders of bone density and structure, unspecified site: Secondary | ICD-10-CM

## 2014-05-06 DIAGNOSIS — C50912 Malignant neoplasm of unspecified site of left female breast: Secondary | ICD-10-CM

## 2014-05-06 DIAGNOSIS — Z170 Estrogen receptor positive status [ER+]: Secondary | ICD-10-CM

## 2014-05-06 DIAGNOSIS — M899 Disorder of bone, unspecified: Secondary | ICD-10-CM

## 2014-05-06 LAB — CBC WITH DIFFERENTIAL/PLATELET
BASO%: 0.4 % (ref 0.0–2.0)
Basophils Absolute: 0 10*3/uL (ref 0.0–0.1)
EOS%: 0.4 % (ref 0.0–7.0)
Eosinophils Absolute: 0 10*3/uL (ref 0.0–0.5)
HEMATOCRIT: 40.5 % (ref 34.8–46.6)
HEMOGLOBIN: 13.8 g/dL (ref 11.6–15.9)
LYMPH#: 1.3 10*3/uL (ref 0.9–3.3)
LYMPH%: 22.2 % (ref 14.0–49.7)
MCH: 28 pg (ref 25.1–34.0)
MCHC: 34.1 g/dL (ref 31.5–36.0)
MCV: 82.2 fL (ref 79.5–101.0)
MONO#: 0.5 10*3/uL (ref 0.1–0.9)
MONO%: 9 % (ref 0.0–14.0)
NEUT%: 68 % (ref 38.4–76.8)
NEUTROS ABS: 3.9 10*3/uL (ref 1.5–6.5)
PLATELETS: 242 10*3/uL (ref 145–400)
RBC: 4.93 10*6/uL (ref 3.70–5.45)
RDW: 12.6 % (ref 11.2–14.5)
WBC: 5.7 10*3/uL (ref 3.9–10.3)

## 2014-05-06 LAB — COMPREHENSIVE METABOLIC PANEL (CC13)
ALT: 10 U/L (ref 0–55)
AST: 16 U/L (ref 5–34)
Albumin: 3.7 g/dL (ref 3.5–5.0)
Alkaline Phosphatase: 66 U/L (ref 40–150)
Anion Gap: 10 mEq/L (ref 3–11)
BUN: 14.3 mg/dL (ref 7.0–26.0)
CALCIUM: 8.7 mg/dL (ref 8.4–10.4)
CO2: 25 mEq/L (ref 22–29)
Chloride: 103 mEq/L (ref 98–109)
Creatinine: 0.8 mg/dL (ref 0.6–1.1)
EGFR: 86 mL/min/{1.73_m2} — ABNORMAL LOW (ref >90)
GLUCOSE: 92 mg/dL (ref 70–140)
POTASSIUM: 4 meq/L (ref 3.5–5.1)
Sodium: 138 mEq/L (ref 136–145)
TOTAL PROTEIN: 6.5 g/dL (ref 6.4–8.3)
Total Bilirubin: 1.04 mg/dL (ref 0.20–1.20)

## 2014-05-06 NOTE — Telephone Encounter (Signed)
[facility] faxed results of PET scan - to Dr. [name] for review.

## 2014-05-06 NOTE — Progress Notes (Signed)
Erin [name]  MR#: [id number]    PCP: Drema Pry, DO GYN: Thomes Cake SU: OTHER MD: Crista Luria, Zenovia Jarred, [place]  CHIEF COMPLAINT: Recurrent breast cancer  CURRENT THERAPY: Goserelin, anastrozole, zolendronate, surgery and radiation   BREAST CANCER HISTORY From the original intake note 04/07/2011:  Erin [name] had routine screening mammography December of 2009 at the [facility] in [place], [place], showing an increasing asymmetry in her left breast. She was recalled for additional views December 29. She was noted to have heterogeneously dense breasts. It was a 5 cm area of architectural distortion in the lateral aspect of the left breast with no associated calcifications. There was a second lesion medial to this. Ultrasound showed a highly suspicious hypoechoic irregularly marginated mass measuring 3 cm at the 2:30 position 5 cm from the nipple. This was palpable to the mammographer. The second area in question I measured 7 mm and a third lesion was noted measuring 5 mm. Some left axillary lymph nodes were morphologically normal.  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 Bilateral breast MRIs were performed 05/04/2008 and showed, in the left breast, a large lobulated mass measuring up to 4.3 cm, and including both of the apparently separate masses the previously biopsied. In the right breast there were 2 indeterminate lesions. These were evaluated further with breast specific gamma imaging performed January 14 in both lesions were negative. The lesion in the left breast was markedly abnormal.  Given this complex history and with the background of significant breast density, the patient opted for bilateral mastectomies with left sentinel lymph node sampling. This was performed of 06/27/2008 and showed(S. [id number]) in the  right breast, no malignancy. In the left breast there was a 3.2 cm invasive ductal carcinoma, grade 1, focally extending to the anterior margin of the lower outer quadrant. There was extensive angiolymphatic invasion, but both sentinel lymph nodes on the left were negative.  The patient received left-sided postmastectomy radiation including of the left chest wall and left supraclavicular fossa to a total dose of 50.4 Gy plus a 10 Gy scar boost. She had an Oncotype DX recurrence score of 17, further discussed below. She decided to forego reconstruction. She was tested for BRCA1 and 2 and was found to be negative. Given her overall prognosis, she did not receive chemotherapy, but started tamoxifen February of 2010, with good tolerance.  RECURRENT DISEASE: Erin [name] took tamoxifen for 5 years with no evidence of active disease area did in May 2015 we decided to stop the tamoxifen because of problems with endometrial hyperplasia. 6 months later, at the November visit, she was found to have a change in a small area of scaring her left inframammary fold. She was referred to dermatology and biopsy 04/21/2014 showed ([id number]) recurrent ductal adenocarcinoma (positive for gross cystic disease fluid protein, estrogen, and progesterone). She then underwent a staging PET scan in [facility] 05/06/2014. This showed a 1.9 cm sclerotic lesion in the right initial tuberosity, with a maximum standard uptake value of 4.6. A sclerotic lesion in the left side of the L3 vertebral body measuring 1.6 cm had no hypermetabolic activity.   Her subsequent history is as detailed below.  INTERVAL HISTORY: Erin [name] returns today for further discussion of her recent findings. She is understandably distraught. Aside from the appropriate anxiety and concern, however, she maintains an excellent functional  status and just returned from a work trip in [place]. She does have some pain in the left lateral breast area, which is not  new, and is very intermittent. She has no pelvic symptoms associated with the lesion noted on PET scan. The hot flashes she had been experiencing while on tamoxifen resolved when she stopped that medication.  REVIEW OF SYSTEMS: A detailed review of systems today was otherwise entirely negative except as noted     Past Medical History  Diagnosis Date  . Breast cancer 2010  . Hypertension   . Allergic rhinitis due to pollen   . Osteopenia   . Hypothyroidism   Diverticulosis       Past Surgical History  Procedure Laterality Date  . Mastectomy  2010    bilateral  . Tonsillectomy    . Right knee arthroscopy  2008  . Inguinal hernia repair      right side as infant  . Wisdom tooth extraction    . Cyst removed      left lower jaw - at age 54 yr  . Dilatation & currettage/hysteroscopy with resectocope N/A 09/16/2013    Procedure: DILATATION & CURETTAGE/HYSTEROSCOPY WITH RESECTOCOPE;  Surgeon: Princess Bruins, MD;  Location: [facility];  Service: Gynecology;  Laterality: N/A;  1 hr.    FAMILY HISTORY The patient's mother died in 1610 from complications of lung cancer. The patient has not been in touch with her father her for approximately 30 years. She had no sisters, 1 brother, who is in good health. There is no breast or ovarian cancer in the family to her knowledge.  GYNECOLOGIC HISTORY:  GX P0, menarche at around age 30, the patient continued to have periods despite being on tamoxifen, although more irregularly. 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     ADVANCED DIRECTIVES: not in place  HEALTH MAINTENANCE:      History  Substance Use Topics  . Smoking status: Never Smoker   . Smokeless tobacco: Never Used  . Alcohol Use: Yes     Comment: rare      Colonoscopy: December 2013 Erin [name])  PAP: Feb 2011  Bone density: 06/2009, nl  Cholesterol: "good"      Allergies  Allergen Reactions  . Adhesive  [Tape] Rash    MEDICATIONS:    Current Outpatient Prescriptions  Medication Sig Dispense Refill  . acyclovir ointment (ZOVIRAX) 5 % Apply 1 application topically every 3 (three) hours. 5 g 5  . Cholecalciferol (VITAMIN D3) 2000 UNITS capsule Take 2,000 Units by mouth daily.      Marland Kitchen levothyroxine (SYNTHROID, LEVOTHROID) 75 MCG tablet Take 1 tablet (75 mcg total) by mouth daily. 90 tablet 1  . loratadine (CLARITIN) 10 MG tablet Take 10 mg by mouth daily.     Marland Kitchen losartan (COZAAR) 25 MG tablet Take 1 tablet (25 mg total) by mouth daily. 90 tablet 3  . Polyethyl Glycol-Propyl Glycol (SYSTANE OP) Apply 1 drop to eye as needed (for allergies).    . sodium chloride (OCEAN) 0.65 % SOLN nasal spray Place 1 spray into both nostrils as needed for congestion.     No current facility-administered medications for this visit.    OBJECTIVE:  Middle-aged white woman in no acute distress    Filed Vitals:   05/06/14 1624  BP: 140/86  Pulse: 86  Temp: 98 F (36.7 C)  Resp: 18     Body mass index  is 25.69 kg/(m^2).     ECOG performance status: 0  Sclerae unicteric, pupils equal and reactive Oropharynx clear, teeth in excellent repair No cervical or supraclavicular adenopathy Lungs no rales or rhonchi Heart regular rate and rhythm Abd soft, nontender, positive bowel sounds MSK no focal spinal tenderness, no upper extremity lymphedema Neuro: nonfocal, well oriented, appropriate affect Breasts: Status post bilateral mastectomies. The area of concern underneath the left breast scar is imaged below   03/15/2014    05/07/2014    LABS:           Chemistry      Component Value Date/Time   NA 138 05/06/2014 1601   NA 137 02/08/2014 0834   K 4.0 05/06/2014 1601   K 4.1 02/08/2014 0834   CL 105 02/08/2014 0834   CL 105 08/10/2012 0944   CO2 25 05/06/2014 1601   CO2 25 02/08/2014 0834   BUN 14.3 05/06/2014 1601   BUN 14 02/08/2014 0834   CREATININE 0.8 05/06/2014 1601   CREATININE 0.8  02/08/2014 0834      Component Value Date/Time   CALCIUM 8.7 05/06/2014 1601   CALCIUM 8.9 02/08/2014 0834   ALKPHOS 66 05/06/2014 1601   ALKPHOS 59 02/08/2014 0834   AST 16 05/06/2014 1601   AST 17 02/08/2014 0834   ALT 10 05/06/2014 1601   ALT 14 02/08/2014 0834   BILITOT 1.04 05/06/2014 1601   BILITOT 0.9 02/08/2014 0834         Lab Results  Component Value Date   WBC 5.7 05/06/2014   HGB 13.8 05/06/2014   HCT 40.5 05/06/2014   MCV 82.2 05/06/2014   PLT 242 05/06/2014   NEUTROABS 3.9 05/06/2014    STUDIES  PET scan from [facility] obtained earlier this morning was reviewed with the patient  ASSESSMENT: 54 y.o.  BRCA negative [place] woman ,  (1) status post bilateral mastectomies January 2010 for a left-sided T2 N0 (stage IIA) invasive ductal carcinoma, grade 1, strongly estrogen and progesterone receptor positive, HER-2 nonamplified,   (2) Oncotype DX recurrence score of 17, predicting a 10-12% distant recurrence risk over 10 years if her only adjuvant treatment is tamoxifen for 5 years  (3) status post left postmastectomy radiation,   (4) on tamoxifen starting February of 2010, stopped May 2015 because of endometrial hyperplasia  (5) osteopenia-- T score -1.2 on repeated bone density scans 09/24/2011 and 12/12/2013  METASTATIC DISEASE (6) skin biopsy of a lesion under the left breast scar shows adenocarcinoma, gross cystic disease fluid protein, estrogen and progesterone receptor positive  (a) full resection pending  (b) consider electron beam radiotherapy  (7) PET scan shows only one additional site of disease, in the right ischiial tuberosity-- biopsy pending  (a) consider SRS if bone scan two weeks after zolendronate shows no other lesions  (8) goserelin monthly to start 05/09/2016  (9) zolendronate to start 05/09/2014, repeated every 12 weeks  (9) anastrozole to start February 2016  Plan: Erin [name] is understandably distraught by having stage  IV, metastatic, incurable, breast cancer. The good news within the bad news though is that we do not see any liver or lung involvement, and in fact we only see 1 spot in the bones aside from the soft tissue recurrence under of the left breast scar.  We are going to address these separately. Ideally she will have the lesion under the left breast resected with clear margins, followed by electron beam radiation (she has already had radiation to that area  of course).  As far as the bone lesion is concerned, first of all we have to document that it is indeed metastatic cancer. I am writing for an MRI of the pelvis and a CT-guided biopsy of the right ischial tuberosity.   I am also starting her on zolendronate. Zolendronate frequently uncovers bone lesions that were moderately dormant previously so it will make sense to obtain the bone scan 2 weeks after the first zolendronate dose. However if there is only 1 bone lesion, I would favor stereotactic radiosurgery to that area if possible.  What the hisstory suggests is that this cancer was well-controlled for 5 years on tamoxifen, but grew after's being off tamoxifen for 6 months. What this suggests in turn is that we should be able to control it again by withdrawal of estrogen. She will have goserelin starting January 18. Once she settles a little bit into the menopausal storm that is likely to follow we will add anastrozole. She is likely to need both venlafaxine and gabapentin but we are going to wait until symptoms develop before starting those medications.  Erin [name] had many questions today regarding prognosis. She is aware of the average survival of stage IV patients. This really does not apply to her, because at this point her metastatic disease is bone only. I suggested a reasonable goal for her was to be alive and in good health 5 years from now so that she will be able to benefit from the multiple new treatments that may be coming down the pike in that  time  Erin [name] has a good understanding of this overall plan. She agrees with it. She knows the goal of treatment in her case is control. She will call with any problems that may develop before her next visit here.  Mckinzi Eriksen C 05/06/2014

## 2014-05-07 LAB — CANCER ANTIGEN 27.29: CA 27.29: 13 U/mL (ref 0–39)

## 2014-05-09 ENCOUNTER — Other Ambulatory Visit: Payer: Self-pay | Admitting: *Deleted

## 2014-05-09 ENCOUNTER — Ambulatory Visit (HOSPITAL_BASED_OUTPATIENT_CLINIC_OR_DEPARTMENT_OTHER): Payer: BC Managed Care – PPO

## 2014-05-09 ENCOUNTER — Ambulatory Visit: Payer: BC Managed Care – PPO | Admitting: Oncology

## 2014-05-09 ENCOUNTER — Other Ambulatory Visit: Payer: Self-pay | Admitting: Oncology

## 2014-05-09 DIAGNOSIS — C50912 Malignant neoplasm of unspecified site of left female breast: Secondary | ICD-10-CM

## 2014-05-09 DIAGNOSIS — C50412 Malignant neoplasm of upper-outer quadrant of left female breast: Secondary | ICD-10-CM

## 2014-05-09 DIAGNOSIS — Z5111 Encounter for antineoplastic chemotherapy: Secondary | ICD-10-CM

## 2014-05-09 MED ORDER — GOSERELIN ACETATE 3.6 MG ~~LOC~~ IMPL
3.6000 mg | DRUG_IMPLANT | Freq: Once | SUBCUTANEOUS | Status: AC
  Administered 2014-05-09: 3.6 mg via SUBCUTANEOUS
  Filled 2014-05-09: qty 3.6

## 2014-05-10 ENCOUNTER — Encounter: Payer: Self-pay | Admitting: *Deleted

## 2014-05-10 ENCOUNTER — Ambulatory Visit (HOSPITAL_COMMUNITY): Payer: BC Managed Care – PPO

## 2014-05-10 NOTE — Progress Notes (Unsigned)
Histology and Location of Primary Cancer: Breast Cancer  Sites of Visceral and Bony Metastatic Disease: 1.9 cm sclerotic lesion in the right initial tuberosity, with a maximum standard uptake value of 4.6. A sclerotic lesion in the left side of the L3 vertebral body measuring 1.6 cm had no hypermetabolic activity.    Tamoxifen for 5 years with no evidence of active disease area did in May 2015 we decided to stop the tamoxifen because of problems with endometrial hyperplasia. 6 months later, at the November visit, she was found to have a change in a small area of scaring her left inframammary fold. She was referred to dermatology and biopsy 04/21/2014 showed ([id number]) recurrent ductal adenocarcinoma (positive for gross cystic disease fluid protein, estrogen, and progesterone). She then underwent a staging PET scan in [facility] 05/06/2014. This showed a 1.9 cm sclerotic lesion in the right initial tuberosity, with a maximum standard uptake value of 4.6. A sclerotic lesion in the left side of the L3 vertebral body measuring 1.6 cm had no hypermetabolic activity.   Location(s) of Symptomatic Metastases: 1.9 cm lesion in the right initial tuberosity,with a maximum standard uptake value of 4.6.   A sclerotic lesion in the left side of the L3 vertebral body measuring 1.6 cm had no hypermetabolic activity.    Past/Anticipated chemotherapy by medical oncology, if any: Goserelin starting January 18. Once she settles a little bit into the menopausal storm that is likely to follow we will add anastrozole. S  Pain on a scale of 0-10 is: {Number; 2-53  not applicable:20727}    If Spine Met(s), symptoms, if any, include:  Bowel/Bladder retention or incontinence (please describe): ***  Numbness or weakness in extremities (please describe):  Current Decadron regimen, if applicable: No  Ambulatory status? Walker? Wheelchair?: {VQI Ambulatory Status:20974}  SAFETY ISSUES:  Prior radiation? The  patient received left-sided postmastectomy radiation including of the left chest wall and left supraclavicular fossa to a total dose of 50.4 Gy plus a 10 Gy scar boost  Pacemaker/ICD?NO   Possible current pregnancy? No  Is the patient on methotrexate?   Current Complaints / other details:  ***

## 2014-05-10 NOTE — Progress Notes (Signed)
Location of Breast Cancer:Upper Outer Quadrant Left Breast ? 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  Receptor Status: ER(99%), PR (100%), Her2-neu (-)  Erin [name] had routine screening mammography December of 2009 at [facility] [place], [place], showing an increasing asymmetry in her left breast. She was recalled for additional views December 29. She was noted to have heterogeneously dense breasts. It was a 5 cm area of architectural distortion in the lateral aspect of the left breast with no associated calcifications. There was a second lesion medial to this. Ultrasound showed a highly suspicious hypoechoic irregularly marginated mass measuring 3 cm at the 2:30 position 5 cm from the nipple. This was palpable to the mammographer. The second area in question I measured 7 mm and a third lesion was noted measuring 5 mm. Some left axillary lymph nodes were morphologically normal.  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 Bilateral breast MRIs were performed 05/04/2008 and showed, in the left breast, a large lobulated mass measuring up to 4.3 cm, and including both of the apparently separate masses the previously biopsied. In the right breast there were 2 indeterminate lesions. These were evaluated further with breast specific gamma imaging performed January 14 in both lesions were negative. The lesion in the left breast was markedly abnormal.  Erin [name] opted for bilateral mastectomies with left sentinel lymph node sampling. This was performed of 06/27/2008 and showed([id number]. [id number]) in the right breast, no malignancy. In the left breast there was a 3.2 cm invasive ductal carcinoma, grade 1, focally extending to the  anterior margin of the lower outer quadrant. There was extensive angiolymphatic invasion, but both sentinel lymph nodes on the left were negative.  She had an Oncotype DX recurrence score of 17, She was tested for BRCA1 and 2 and was found to be negative  Tamoxifen for 5 years with no evidence of active disease area did in May 2015 we decided to stop the tamoxifen because of problems with endometrial hyperplasia. 6 months later, at the November visit, she was found to have a change in a small area of scaring her left inframammary fold. She was referred to dermatology and biopsy 04/21/2014 showed ([id number]) recurrent ductal adenocarcinoma (positive for gross cystic disease fluid protein, estrogen, and progesterone). She then underwent a staging PET scan in [facility] 05/06/2014. This showed a 1.9 cm sclerotic lesion in the right initial tuberosity, with a maximum standard uptake value of 4.6. A sclerotic lesion in the left side of the L3 vertebral body measuring 1.6 cm had no hypermetabolic activity.   To have an MRI of the Pelvis on 05/20/14  Past/Anticipated interventions by surgeon, if any: Biopsy, Skin on left Breast  Past/Anticipated interventions by medical oncology, if any: Chemotherapy: did not receive chemotherapy, but started tamoxifen February of 2010, with good tolerance. Plan now: goserelin starting January 18. Once she settles a little bit into the menopausal storm that is likely to follow we will add anastrozole. She is likely to need both venlafaxine and gabapentin but we are going to wait until symptoms develop before starting those medications.  Lymphedema issues, if any:   Pain issues, if any: Reports 0 pain today  SAFETY ISSUES:  Prior radiation? The patient received left-sided postmastectomy radiation including  of the left chest wall and left supraclavicular fossa to a total dose of 50.4 Gy plus a 10 Gy scar boost.  Pacemaker/ICD? NO  Possible current  pregnancy?No  Is the patient on methotrexate? No  Current Complaints / other details:     Erin Evener, RN 05/10/2014,5:46 PM

## 2014-05-11 ENCOUNTER — Ambulatory Visit
Admission: RE | Admit: 2014-05-11 | Discharge: 2014-05-11 | Disposition: A | Discharge status: Home / Self Care | Payer: BC Managed Care – PPO | Source: Ambulatory Visit | Attending: Radiation Oncology | Admitting: Radiation Oncology

## 2014-05-11 ENCOUNTER — Other Ambulatory Visit: Payer: Self-pay | Admitting: *Deleted

## 2014-05-11 ENCOUNTER — Encounter: Payer: Self-pay | Admitting: Radiation Oncology

## 2014-05-11 VITALS — BP 127/75 | HR 83 | Temp 98.0°F | Ht 65.0 in | Wt 157.1 lb

## 2014-05-11 DIAGNOSIS — I10 Essential (primary) hypertension: Secondary | ICD-10-CM | POA: Insufficient documentation

## 2014-05-11 DIAGNOSIS — Z510 Encounter for antineoplastic radiation therapy: Secondary | ICD-10-CM | POA: Diagnosis present

## 2014-05-11 DIAGNOSIS — Z923 Personal history of irradiation: Secondary | ICD-10-CM | POA: Insufficient documentation

## 2014-05-11 DIAGNOSIS — Z170 Estrogen receptor positive status [ER+]: Secondary | ICD-10-CM | POA: Diagnosis not present

## 2014-05-11 DIAGNOSIS — E039 Hypothyroidism, unspecified: Secondary | ICD-10-CM | POA: Insufficient documentation

## 2014-05-11 DIAGNOSIS — L598 Other specified disorders of the skin and subcutaneous tissue related to radiation: Secondary | ICD-10-CM | POA: Diagnosis not present

## 2014-05-11 DIAGNOSIS — C50919 Malignant neoplasm of unspecified site of unspecified female breast: Secondary | ICD-10-CM | POA: Insufficient documentation

## 2014-05-11 DIAGNOSIS — Z9013 Acquired absence of bilateral breasts and nipples: Secondary | ICD-10-CM | POA: Insufficient documentation

## 2014-05-11 DIAGNOSIS — C50912 Malignant neoplasm of unspecified site of left female breast: Secondary | ICD-10-CM

## 2014-05-11 DIAGNOSIS — Z9221 Personal history of antineoplastic chemotherapy: Secondary | ICD-10-CM | POA: Diagnosis not present

## 2014-05-11 DIAGNOSIS — Z853 Personal history of malignant neoplasm of breast: Secondary | ICD-10-CM | POA: Diagnosis not present

## 2014-05-11 DIAGNOSIS — J301 Allergic rhinitis due to pollen: Secondary | ICD-10-CM | POA: Diagnosis not present

## 2014-05-11 DIAGNOSIS — C50412 Malignant neoplasm of upper-outer quadrant of left female breast: Secondary | ICD-10-CM

## 2014-05-11 DIAGNOSIS — M8580 Other specified disorders of bone density and structure, unspecified site: Secondary | ICD-10-CM | POA: Diagnosis not present

## 2014-05-11 NOTE — Progress Notes (Signed)
[facility] NEW PATIENT EVALUATION  Name: Lalaine Overstreet MRN: [id number]  Date:   05/11/2014           DOB: 07-Jun-1960  Status: outpatient   CC: Drema Pry, DO  [name], [name], MD Dr. 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 female who is seen today through the courtesy of Dr. [name] for consideration of reirradiation of a left chest wall recurrence and possible radiosurgery to her right ischium.  Her past history is well summarized in Dr. [name] notes.  She was diagnosed with T2 N0 invasive ductal carcinoma of the left breast in February 2010.  She underwent bilateral mastectomies (prophylactic on the right) with a left sentinel lymph node sampling.  She had a 3.2 cm invasive ductal carcinoma, grade 1, focally extending to the anterior margin of the lower outer quadrant along with extensive angiolymphatic invasion but both sentinel lymph nodes were negative.  She received left-sided post mastectomy radiation therapy and left supraclavicular fossa, 5040 cGy in 28 sessions followed by a 1000 cGy/5  boost to her left mastectomy scar.  Her Oncotype DX score was 17.  She received adjuvant tamoxifen for 5 years and this was discontinued on May 2015.  She states that approximately 2 years ago she noted a skin lesion lateral to her left mastectomy scar which began to grow after discontinuation of her tamoxifen.  She was seen by Dr.Gruber who performed a biopsy of the left chest wall lesion on 04/21/2014, which was consistent with metastatic breast cancer.  There was some positivity for estrogen and progesterone receptors.  She was seen by Dr. Donne Hazel for excision of the recurrence followed by possible radiation therapy.  She is not yet postmenopausal and she was started on goserelin in anticipation of starting an  aromatase inhibitor.  Her staging workup included a PET scan at [facility] on 05/06/2014 which showed a 1.9 cm sclerotic lesion in the right ischial tuberosity with increased metabolic activity.  Another sclerotic lesion was seen in the left side of L3 measuring 1.6 cm but no hypermetabolic activity.  She is being scheduled for follow-up MRI scans and a bone scan, and perhaps biopsy of her right ischium.  She is being seen by Dr. Donne Hazel who is scheduled her for ultrasound studies to include her left axilla at [facility] before right excision of her left chest wall recurrence scheduled for February 10.  She is without complaints today.  She denies right ischial discomfort.  PREVIOUS RADIATION THERAPY: Yes, please see above note, records are being scanned into the EMR.  We've requested photographs, simulation films and dosimetry from [place].   PAST MEDICAL HISTORY:  has a past medical history of Breast cancer (2010); Hypertension; Allergic rhinitis due to pollen; Osteopenia; and Hypothyroidism.     PAST SURGICAL HISTORY:  Past Surgical History  Procedure Laterality Date  . Mastectomy  2010    bilateral  . Tonsillectomy    . Right knee arthroscopy  2008  . Inguinal hernia repair      right side as infant  . Wisdom tooth extraction    . Cyst removed      left lower jaw - at age 81 yr  . Dilatation & currettage/hysteroscopy with resectocope N/A 09/16/2013    Procedure: DILATATION & CURETTAGE/HYSTEROSCOPY WITH RESECTOCOPE;  Surgeon: Princess Bruins,  MD;  Location: [facility];  Service: Gynecology;  Laterality: N/A;  1 hr.     FAMILY HISTORY: family history includes Cancer in her mother; Hypertension in her father and mother. There is no history of Colon cancer or Stomach cancer.  Her mother died from lung cancer in her early 25s.  She is unaware of her father's health.   SOCIAL HISTORY:  reports that she has never smoked. She has never used smokeless tobacco. She reports that she  drinks alcohol. She reports that she does not use illicit drugs. Single, no children.  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 calcium carbonate (OS-CAL) 600 MG TABS tablet Take 600 mg by mouth 1 day or 1 dose.    . Cholecalciferol (VITAMIN D3) 2000 UNITS capsule Take 2,000 Units by mouth daily.      Marland Kitchen levothyroxine (SYNTHROID, LEVOTHROID) 75 MCG tablet Take 1 tablet (75 mcg total) by mouth daily. 90 tablet 1  . loratadine (CLARITIN) 10 MG tablet Take 10 mg by mouth daily as needed.     Marland Kitchen losartan (COZAAR) 25 MG tablet Take 1 tablet (25 mg total) by mouth daily. 90 tablet 3  . Polyethyl Glycol-Propyl Glycol (SYSTANE OP) Apply 1 drop to eye as needed (for allergies).    . sodium chloride (OCEAN) 0.65 % SOLN nasal spray Place 1 spray into both nostrils as needed for congestion.    Marland Kitchen acyclovir ointment (ZOVIRAX) 5 % Apply 1 application topically every 3 (three) hours. (Patient not taking: Reported on 05/11/2014) 5 g 5   No current facility-administered medications for this encounter.     REVIEW OF SYSTEMS:  Pertinent items are noted in HPI.    PHYSICAL EXAM:  height is 5\' 5"  (1.651 m) and weight is 157 lb 1.6 oz (71.26 kg). Her temperature is 98 F (36.7 C). Her blood pressure is 127/75 and her pulse is 83.   Alert and oriented 54 year old white female appearing her stated age.  Head and neck examination: Grossly unremarkable.  Nodes: Without palpable cervical, supraclavicular, or axillary lymphadenopathy.  Chest: Bilateral mastectomies.  There is a slightly raised healing wound just lateral to her mastectomy scar over an area of 1-1.5 cm.  There is telangiectasia along her mastectomy scar from her electron beam boost radiation.  There is no other visible or palpable evidence for recurrent disease along her left chest wall.  Lungs are clear.  Abdomen without masses organomegaly.   Extremities: Without edema.   LABORATORY DATA:  Lab Results  Component Value Date   WBC 5.7 05/06/2014   HGB 13.8 05/06/2014   HCT 40.5 05/06/2014   MCV 82.2 05/06/2014   PLT 242 05/06/2014   Lab Results  Component Value Date   NA 138 05/06/2014   K 4.0 05/06/2014   CL 105 02/08/2014   CO2 25 05/06/2014   Lab Results  Component Value Date   ALT 10 05/06/2014   AST 16 05/06/2014   ALKPHOS 66 05/06/2014   BILITOT 1.04 05/06/2014      IMPRESSION: Recurrent hormone receptor positive invasive ductal carcinoma of left breast.  I do recommend wide excision as scheduled by Dr. Donne Hazel and then consideration of postoperative radiation therapy.  I suspect that her area of recurrence is just outside her electron beam boost and we should be able to deliver at least 4000 cGy in 20 sessions to this area  without significant risk for toxicity.  She will complete her staging workup with a bone scan and MRI scan of her right ischium, followed by possible biopsy for confirmation of metastatic disease  to determine if she has oligo metastatic disease.  If she has oligo metastatic disease then she could be considered for a possible radiosurgery.   PLAN: I will see her back for a follow-up visit in one month, following her left chest wall excision.  We will review her staging workup at that time.  We can also discussed the possibility of radiosurgery. I spent 60 minutes face to face with the patient and more than 50% of that time was spent in counseling and/or coordination of care.

## 2014-05-11 NOTE — Addendum Note (Signed)
Encounter addended by: Deirdre Evener, RN on: 05/11/2014  6:03 PM<BR>     Documentation filed: BPA Follow-up Actions, Flowsheet VN, Dx Association, Orders

## 2014-05-11 NOTE — Addendum Note (Signed)
Encounter addended by: Deirdre Evener, RN on: 05/11/2014  6:41 PM<BR>     Documentation filed: Charges VN

## 2014-05-12 LAB — HM MAMMOGRAPHY: HM Mammogram: NEGATIVE

## 2014-05-18 ENCOUNTER — Encounter: Payer: Self-pay | Admitting: Oncology

## 2014-05-19 ENCOUNTER — Encounter: Payer: Self-pay | Admitting: Internal Medicine

## 2014-05-19 ENCOUNTER — Other Ambulatory Visit: Payer: Self-pay | Admitting: Radiology

## 2014-05-20 ENCOUNTER — Encounter: Payer: Self-pay | Admitting: *Deleted

## 2014-05-20 ENCOUNTER — Ambulatory Visit (HOSPITAL_COMMUNITY)
Admission: RE | Admit: 2014-05-20 | Discharge: 2014-05-20 | Disposition: A | Discharge status: Home / Self Care | Payer: BC Managed Care – PPO | Source: Ambulatory Visit | Attending: Oncology | Admitting: Oncology

## 2014-05-20 ENCOUNTER — Encounter (HOSPITAL_COMMUNITY)
Admission: RE | Admit: 2014-05-20 | Discharge: 2014-05-20 | Disposition: A | Discharge status: Home / Self Care | Payer: BC Managed Care – PPO | Source: Ambulatory Visit | Attending: Oncology | Admitting: Oncology

## 2014-05-20 ENCOUNTER — Ambulatory Visit (HOSPITAL_COMMUNITY): Payer: BC Managed Care – PPO

## 2014-05-20 ENCOUNTER — Other Ambulatory Visit: Payer: Self-pay | Admitting: Oncology

## 2014-05-20 DIAGNOSIS — C50412 Malignant neoplasm of upper-outer quadrant of left female breast: Secondary | ICD-10-CM

## 2014-05-20 DIAGNOSIS — C50912 Malignant neoplasm of unspecified site of left female breast: Secondary | ICD-10-CM

## 2014-05-20 DIAGNOSIS — R937 Abnormal findings on diagnostic imaging of other parts of musculoskeletal system: Secondary | ICD-10-CM | POA: Insufficient documentation

## 2014-05-20 MED ORDER — TECHNETIUM TC 99M MEDRONATE IV KIT
26.9000 | PACK | Freq: Once | INTRAVENOUS | Status: AC | PRN
  Administered 2014-05-20: 26.9 via INTRAVENOUS

## 2014-05-20 MED ORDER — GADOBENATE DIMEGLUMINE 529 MG/ML IV SOLN
14.0000 mL | Freq: Once | INTRAVENOUS | Status: AC | PRN
  Administered 2014-05-20: 14 mL via INTRAVENOUS

## 2014-05-20 NOTE — Progress Notes (Signed)
[facility] Psychosocial Distress Screening Clinical Social Work  Clinical Social Work was referred by distress screening protocol.  The patient scored a 5 on the Psychosocial Distress Thermometer which indicates moderate distress. Clinical Social Worker phoned Erin [name] to assess for distress and other psychosocial needs. Erin [name] voiced frustrations at lack of communication and not being able to get through to MD office. She is not familiar with all the resources here and CSW reviewed them with her, including Erin [name] and Family Support Team, Runner, broadcasting/film/video, CSWs, chaplains and support programs. Erin [name] upset by diagnosis, but is glad to know of additional supports available. Erin [name] agrees to referral to [facility] [employer] as she had many questions about her treatment plan. CSW provided Erin [name] with CSW team contact number as well.   ONCBCN DISTRESS SCREENING 05/11/2014  Screening Type Initial Screening  Distress experienced in past week (1-10) 5  Practical problem type Work/school;Transportation  Emotional problem type Adjusting to illness  Information Concerns Type Lack of info about diagnosis;Lack of info about treatment    Clinical Social Worker follow up needed: Yes.   Referral to RN navigators  If yes, follow up plan: Loren Racer, [place]  [facility] Phone: [phone] Fax: [phone]

## 2014-05-23 ENCOUNTER — Telehealth: Payer: Self-pay | Admitting: Oncology

## 2014-05-23 ENCOUNTER — Other Ambulatory Visit: Payer: Self-pay | Admitting: *Deleted

## 2014-05-23 ENCOUNTER — Other Ambulatory Visit: Payer: Self-pay | Admitting: Radiology

## 2014-05-23 ENCOUNTER — Telehealth: Payer: Self-pay | Admitting: *Deleted

## 2014-05-23 DIAGNOSIS — C50912 Malignant neoplasm of unspecified site of left female breast: Secondary | ICD-10-CM

## 2014-05-23 NOTE — Telephone Encounter (Signed)
This RN spoke with pt per her call last week -with multiple concerns.  Of most concern is pt states she has started her period " it is light but this would be early for me anyway - and I thought that shot ( zoladex ) was to stop my periods- does this mean it is not working ?"  This RN discussed concern with above will be addressed to MD covering for Dr Jannifer Rodney and call returned to her tomorrow.  Discussed results of scans verifying known area of probable bone mets.  [name] also inquired about Bx tomorrow " it isn't showing on my chart anymore "  This RN reviewed pt's schedule and noted biopsy is scheduled - informed pt of times for biopsy noted on appointment schedule.  Pt will call on Thursday for results of biopsy.  POF placed to reschedule injections to 2/15 and then q 28 days ( appt was made for 28 days per POF date )  This RN called to [facility] and obtained an appointment with Dr Dellis Filbert for this Thurday 2/4 at 145pm to obtain PAP and discuss possible oophorectomy for estrogen depletion.  Call returned to pt and message left on identified VM per appointment above.  This note will be given to MD for follow up for appointment time to see him as well at to address any other concerns.

## 2014-05-23 NOTE — Telephone Encounter (Signed)
Patient confirmed all appointment for February thru July.

## 2014-05-23 NOTE — Telephone Encounter (Signed)
Left voice mail to confirm appointments for Injection. Also to have patient call Dr.Lavioe office to schedule appointment.

## 2014-05-24 ENCOUNTER — Ambulatory Visit (HOSPITAL_COMMUNITY)
Admission: RE | Admit: 2014-05-24 | Discharge: 2014-05-24 | Disposition: A | Discharge status: Home / Self Care | Payer: BC Managed Care – PPO | Source: Ambulatory Visit | Attending: Oncology | Admitting: Oncology

## 2014-05-24 ENCOUNTER — Encounter (HOSPITAL_COMMUNITY): Payer: Self-pay

## 2014-05-24 DIAGNOSIS — M8580 Other specified disorders of bone density and structure, unspecified site: Secondary | ICD-10-CM | POA: Insufficient documentation

## 2014-05-24 DIAGNOSIS — Z853 Personal history of malignant neoplasm of breast: Secondary | ICD-10-CM | POA: Diagnosis not present

## 2014-05-24 DIAGNOSIS — I10 Essential (primary) hypertension: Secondary | ICD-10-CM | POA: Insufficient documentation

## 2014-05-24 DIAGNOSIS — C50912 Malignant neoplasm of unspecified site of left female breast: Secondary | ICD-10-CM

## 2014-05-24 DIAGNOSIS — C50412 Malignant neoplasm of upper-outer quadrant of left female breast: Secondary | ICD-10-CM

## 2014-05-24 DIAGNOSIS — Z79899 Other long term (current) drug therapy: Secondary | ICD-10-CM | POA: Insufficient documentation

## 2014-05-24 DIAGNOSIS — E039 Hypothyroidism, unspecified: Secondary | ICD-10-CM | POA: Insufficient documentation

## 2014-05-24 LAB — CBC
HCT: 42.4 % (ref 36.0–46.0)
Hemoglobin: 14.1 g/dL (ref 12.0–15.0)
MCH: 27.9 pg (ref 26.0–34.0)
MCHC: 33.3 g/dL (ref 30.0–36.0)
MCV: 84 fL (ref 78.0–100.0)
Platelets: 216 10*3/uL (ref 150–400)
RBC: 5.05 MIL/uL (ref 3.87–5.11)
RDW: 13.1 % (ref 11.5–15.5)
WBC: 4.7 10*3/uL (ref 4.0–10.5)

## 2014-05-24 LAB — PROTIME-INR
INR: 0.96 (ref 0.00–1.49)
Prothrombin Time: 12.8 seconds (ref 11.6–15.2)

## 2014-05-24 LAB — APTT: aPTT: 28 seconds (ref 24–37)

## 2014-05-24 MED ORDER — SODIUM CHLORIDE 0.9 % IV SOLN
INTRAVENOUS | Status: DC
  Administered 2014-05-24: 09:00:00 via INTRAVENOUS

## 2014-05-24 MED ORDER — FENTANYL CITRATE 0.05 MG/ML IJ SOLN
INTRAMUSCULAR | Status: AC
  Filled 2014-05-24: qty 4

## 2014-05-24 MED ORDER — MIDAZOLAM HCL 2 MG/2ML IJ SOLN
INTRAMUSCULAR | Status: AC | PRN
  Administered 2014-05-24 (×4): 1 mg via INTRAVENOUS

## 2014-05-24 MED ORDER — FENTANYL CITRATE 0.05 MG/ML IJ SOLN
INTRAMUSCULAR | Status: AC | PRN
  Administered 2014-05-24: 25 ug via INTRAVENOUS
  Administered 2014-05-24: 50 ug via INTRAVENOUS
  Administered 2014-05-24: 25 ug via INTRAVENOUS

## 2014-05-24 MED ORDER — MIDAZOLAM HCL 2 MG/2ML IJ SOLN
INTRAMUSCULAR | Status: AC
  Filled 2014-05-24: qty 6

## 2014-05-24 NOTE — H&P (Signed)
Chief Complaint: "I'm having a biopsy"  Referring Physician(s): [name],[name]  History of Present Illness: Erin [name] is a 54 y.o. female with history of stage IV breast cancer and recent imaging revealing hypermetabolic activity in right ischial tuberosity. She presents today for CT guided biopsy of the right ischial lesion.   Past Medical History  Diagnosis Date  . Hypertension   . Allergic rhinitis due to pollen   . Osteopenia   . Hypothyroidism   . Breast cancer 2010    Past Surgical History  Procedure Laterality Date  . Tonsillectomy    . Right knee arthroscopy  2008  . Inguinal hernia repair      right side as infant  . Wisdom tooth extraction    . Cyst removed      left lower jaw - at age 53 yr  . Dilatation & currettage/hysteroscopy with resectocope N/A 09/16/2013    Procedure: DILATATION & CURETTAGE/HYSTEROSCOPY WITH RESECTOCOPE;  Surgeon: Princess Bruins, MD;  Location: [facility];  Service: Gynecology;  Laterality: N/A;  1 hr.  . Mastectomy  2010    bilateral    Allergies: Adhesive  Medications: Prior to Admission medications   Medication Sig Start Date End Date Taking? Authorizing Provider  acyclovir ointment (ZOVIRAX) 5 % Apply 1 application topically every 3 (three) hours. Patient taking differently: Apply 1 application topically every 3 (three) hours as needed (for outbreaks).  02/16/14  Yes Doe-Hyun R Shawna Orleans, DO  calcium carbonate (OS-CAL) 600 MG TABS tablet Take 600 mg by mouth daily with breakfast.    Yes Historical Provider, MD  Cholecalciferol (VITAMIN D3) 2000 UNITS capsule Take 2,000 Units by mouth daily.     Yes Historical Provider, MD  levothyroxine (SYNTHROID, LEVOTHROID) 75 MCG tablet Take 1 tablet (75 mcg total) by mouth daily. 02/16/14  Yes Doe-Hyun R Shawna Orleans, DO  loratadine (CLARITIN) 10 MG tablet Take 10 mg by mouth daily as needed for allergies.    Yes Historical Provider, MD  losartan (COZAAR) 25 MG tablet Take 1 tablet (25 mg total) by  mouth daily. 02/16/14  Yes Doe-Hyun Kyra Searles, DO  Polyethyl Glycol-Propyl Glycol (SYSTANE OP) Apply 1 drop to eye as needed (for allergies).   Yes Historical Provider, MD  sodium chloride (OCEAN) 0.65 % SOLN nasal spray Place 1 spray into both nostrils as needed for congestion.   Yes Historical Provider, MD    Family History  Problem Relation Age of Onset  . Hypertension Mother   . Cancer Mother     Lung Cancer - long history of tobacco use  . Hypertension Father   . Colon cancer Neg Hx   . Stomach cancer Neg Hx     History   Social History  . Marital Status: Single    Spouse Name: N/A    Number of Children: N/A  . Years of Education: N/A   Social History Main Topics  . Smoking status: Never Smoker   . Smokeless tobacco: Never Used  . Alcohol Use: Yes     Comment: rare  . Drug Use: No  . Sexual Activity: Yes    Birth Control/ Protection: None   Other Topics Concern  . None   Social History Narrative   She grew up in [place] last 16 yrs in [place]  Constitutional: Negative for fever and chills.  Respiratory: Negative for cough and shortness of breath.   Cardiovascular: Negative for chest pain.  Gastrointestinal: Negative for nausea, vomiting, abdominal pain and blood in stool.  Genitourinary: Negative for dysuria and hematuria.       Currently having menses  Musculoskeletal: Positive for back pain.  Neurological:       Occ HA's  Psychiatric/Behavioral: The patient is nervous/anxious.     Vital Signs: BP 126/72 mmHg  Pulse 83  Temp(Src) 98.1 F (36.7 C)  Resp 16  Ht 5\' 5"  (1.651 m)  Wt 157 lb 1.6 oz (71.26 kg)  BMI 26.14 kg/m2  SpO2 99%  LMP 05/23/2014  Physical Exam  Constitutional: She is oriented to person, place, and time. She appears well-developed and well-nourished.  Cardiovascular: Normal rate and regular rhythm.   Pulmonary/Chest: Effort normal and breath sounds normal.  Abdominal: Soft. Bowel sounds are normal.  There is no tenderness.  Musculoskeletal: Normal range of motion. She exhibits no edema.  Neurological: She is alert and oriented to person, place, and time.    Imaging: Mr Pelvis W Wo Contrast  05/20/2014   CLINICAL DATA:  History of recurrent left breast cancer. Sclerotic and hypermetabolic lesion identified in the right is still tuberosity by PET CT scan.  EXAM: MRI PELVIS WITHOUT AND WITH CONTRAST  TECHNIQUE: Multiplanar multisequence MR imaging of the pelvis was performed both before and after administration of intravenous contrast.  CONTRAST:  14 mL MULTIHANCE GADOBENATE DIMEGLUMINE 529 MG/ML IV SOLN  COMPARISON:  PET CT scan 05/06/2014.  FINDINGS: In the right ischial tuberosity, there is a T1 hypointense lesion measuring 2.2 x 1.6 cm in diameter in the axial plane correlating with finding on PET CT scan. The lesion demonstrates minimally increased T2 signal and postcontrast enhancement and is within the medullary space. No other focal bony lesion is identified. The right hamstring origin is normal in appearance. All visualized muscles and tendons appear normal. No notable degenerative change is seen about the hips, symphysis pubis or sacroiliac joints. Imaged lower lumbar spine shows mild disc bulging but the central canal appears open. Imaged intrapelvic contents are unremarkable.  IMPRESSION: Focus of abnormal signal in the right ischial tuberosity has an appearance highly suspicious for metastatic disease.   Electronically Signed   By: Inge Rise M.D.   On: 05/20/2014 10:27   Nm Bone Scan Whole Body  05/20/2014   CLINICAL DATA:  Left breast malignancy, asymptomatic ; recent abnormal PET-CT study in the right ischial tuberosity.  EXAM: NUCLEAR MEDICINE WHOLE BODY BONE SCAN  TECHNIQUE: Whole body anterior and posterior images were obtained approximately 3 hours after intravenous injection of radiopharmaceutical.  RADIOPHARMACEUTICALS:  26.9 mCi Technetium-99 MDP  COMPARISON:  PET-CT study of  May 06, 2014 semi: Images from a bone density study of October 03, 2011  FINDINGS: There is adequate uptake of the radiopharmaceutical by the liver. Adequate soft tissue clearance and renal activity is demonstrated.  There is an abnormal focus of increased uptake corresponding to the PET-CT findings in the posterior-inferior aspect of the right ischium.  Uptake elsewhere within the skeleton is within the limits of normal.  IMPRESSION: 1. There is a focus of abnormal uptake in the posterior inferior aspect of the right ischium corresponding to the sclerotic lesion on the recent PET-CT study. This this is suspicious for a sclerotic metastatic focus and will merit followup. Plain films of the pelvis are recommended now to establish a baseline for subsequent follow-up. 2. No abnormal uptake is demonstrated elsewhere within the skeleton.   Electronically Signed   By: David  Martinique   On: 05/20/2014  10:35    Labs:  CBC:  Recent Labs  02/08/14 0834 03/02/14 0948 05/06/14 1601 05/24/14 0850  WBC 5.5 4.8 5.7 4.7  HGB 13.6 13.5 13.8 14.1  HCT 41.7 41.3 40.5 42.4  PLT 207.0 216 242 216    COAGS:  Recent Labs  05/24/14 0850  INR 0.96  APTT 28    BMP:  Recent Labs  08/23/13 0955  11/12/13 1000 02/08/14 0834 03/02/14 0948 05/06/14 1601  NA 138  < > 136 137 137 138  K 4.1  < > 3.8 4.1 4.0 4.0  CL 101  --  104 105  --   --   CO2 27  < > 27 25 24 25   GLUCOSE 71  < > 100* 88 90 92  BUN 16  < > 13 14 15.4 14.3  CALCIUM 9.2  < > 9.0 8.9 9.0 8.7  CREATININE 0.88  < > 0.9 0.8 0.8 0.8  GFRNONAA 75*  --   --   --   --   --   GFRAA 87*  --   --   --   --   --   < > = values in this interval not displayed.  LIVER FUNCTION TESTS:  Recent Labs  09/27/13 0852 02/08/14 0834 03/02/14 0948 05/06/14 1601  BILITOT 0.72 0.9 0.98 1.04  AST 16 17 16 16   ALT 10 14 16 10   ALKPHOS 51 59 64 66  PROT 6.3* 6.9 6.6 6.5  ALBUMIN 3.4* 3.3* 3.6 3.7    TUMOR MARKERS: No results for input(s): AFPTM,  CEA, CA199, CHROMGRNA in the last 8760 hours.  Assessment and Plan: Erin [name] is a 54 y.o. female with history of stage IV breast cancer and recent imaging revealing hypermetabolic activity in right ischial tuberosity. She presents today for CT guided biopsy of the right ischial lesion.Details/risks of procedure d/w pt with her understanding and consent.    Signed: Autumn Messing 05/24/2014, 9:18 AM

## 2014-05-24 NOTE — Procedures (Signed)
Technically successful CT guided biopsy of hypermetabolic lesion within the right ischial tuberosity.  No immediate post procedural complications.

## 2014-05-24 NOTE — Discharge Instructions (Signed)
Leave dressing on for 24 hours.  You may shower after 24 hours.  Please remove the dressing before you shower.     Conscious Sedation, Adult, Care After Refer to this sheet in the next few weeks. These instructions provide you with information on caring for yourself after your procedure. Your health care provider may also give you more specific instructions. Your treatment has been planned according to current medical practices, but problems sometimes occur. Call your health care provider if you have any problems or questions after your procedure. WHAT TO EXPECT AFTER THE PROCEDURE  After your procedure:  You may feel sleepy, clumsy, and have poor balance for several hours.  Vomiting may occur if you eat too soon after the procedure. HOME CARE INSTRUCTIONS  Do not participate in any activities where you could become injured for at least 24 hours. Do not:  Drive.  Swim.  Ride a bicycle.  Operate heavy machinery.  Cook.  Use power tools.  Climb ladders.  Work from a high place.  Do not make important decisions or sign legal documents until you are improved.  If you vomit, drink water, juice, or soup when you can drink without vomiting. Make sure you have little or no nausea before eating solid foods.  Only take over-the-counter or prescription medicines for pain, discomfort, or fever as directed by your health care provider.  Make sure you and your family fully understand everything about the medicines given to you, including what side effects may occur.  You should not drink alcohol, take sleeping pills, or take medicines that cause drowsiness for at least 24 hours.  If you smoke, do not smoke without supervision.  If you are feeling better, you may resume normal activities 24 hours after you were sedated.  Keep all appointments with your health care provider. SEEK MEDICAL CARE IF:  Your skin is pale or bluish in color.  You continue to feel nauseous or vomit.  Your  pain is getting worse and is not helped by medicine.  You have bleeding or swelling.  You are still sleepy or feeling clumsy after 24 hours. SEEK IMMEDIATE MEDICAL CARE IF:  You develop a rash.  You have difficulty breathing.  You develop any type of allergic problem.  You have a fever. MAKE SURE YOU:  Understand these instructions.  Will watch your condition.  Will get help right away if you are not doing well or get worse. Document Released: 01/27/2013 Document Reviewed: 01/27/2013 [facility] Patient Information 2015 [place], [place]. This information is not intended to replace advice given to you by your health care provider. Make sure you discuss any questions you have with your health care provider.  Bone Biopsy, Needle, Care After Read the instructions outlined below and refer to this sheet in the next few weeks. These discharge instructions provide you with general information on caring for yourself after you leave the hospital. Your caregiver may also give you specific instructions. While your treatment has been planned according to the most current medical practices available, unavoidable complications sometimes occur. If you have any problems or questions after discharge, call your caregiver. Finding out the results of your test Not all test results are available during your visit. If your test results are not back during the visit, make an appointment with your caregiver to find out the results. Do not assume everything is normal if you have not heard from your caregiver or the medical facility. It is important for you to follow up on  all of your test results.  SEEK MEDICAL CARE IF:   You have redness, swelling, or increasing pain at the site of the biopsy.  You have pus coming from the biopsy site.  You have drainage from the biopsy site lasting longer than 1 day.  You notice a bad smell coming from the biopsy site or dressing.  You develop persistent nausea or  vomiting. SEEK IMMEDIATE MEDICAL CARE IF:  You have a fever.  You develop a rash.  You have difficulty breathing.  You develop any reaction or side effects to medicines given. Document Released: 10/26/2004 Document Revised: 01/27/2013 Document Reviewed: 09/13/2008 [facility] Patient Information 2015 [place], [place]. This information is not intended to replace advice given to you by your health care provider. Make sure you discuss any questions you have with your health care provider.

## 2014-05-25 ENCOUNTER — Telehealth: Payer: Self-pay | Admitting: *Deleted

## 2014-05-25 ENCOUNTER — Encounter (HOSPITAL_BASED_OUTPATIENT_CLINIC_OR_DEPARTMENT_OTHER): Payer: Self-pay | Admitting: *Deleted

## 2014-05-25 NOTE — Telephone Encounter (Signed)
Received call from Dr. Pascal Lux in radiology asking to speak to Dr. [name] regarding patient. Informed Dr. Pascal Lux that Dr. [name] will be back in the office on Friday. Will pass along Dr. Pascal Lux pager to Dr. [name].  Dr. Pascal Lux pager: [phone]  Message routed [place], [place].

## 2014-05-25 NOTE — Progress Notes (Signed)
No new labs needed-cbc cmet done 05/06/14-ptt cbc 05/24/14 ekg 5/15 She is new to area and hiring a nursing care service post op

## 2014-05-26 ENCOUNTER — Other Ambulatory Visit (INDEPENDENT_AMBULATORY_CARE_PROVIDER_SITE_OTHER): Payer: Self-pay | Admitting: General Surgery

## 2014-05-27 ENCOUNTER — Telehealth: Payer: Self-pay | Admitting: Oncology

## 2014-05-27 ENCOUNTER — Other Ambulatory Visit: Payer: Self-pay | Admitting: Oncology

## 2014-05-27 ENCOUNTER — Telehealth: Payer: Self-pay

## 2014-05-27 DIAGNOSIS — C50919 Malignant neoplasm of unspecified site of unspecified female breast: Secondary | ICD-10-CM

## 2014-05-27 NOTE — Telephone Encounter (Signed)
Pt called wanting to know results of pathology and then what is the next step. Told her report read "no definitive malignancy identified", and said the MD would have to clarify what this means. She was asking if she needed labs or anything before her surgery next week. Also asking that her obgyn doc gets our information. Told her she needs to fill out release of information form.  Call forwarded to [facility] with [name].

## 2014-05-27 NOTE — Telephone Encounter (Signed)
, °

## 2014-05-29 ENCOUNTER — Other Ambulatory Visit: Payer: Self-pay | Admitting: Oncology

## 2014-05-29 NOTE — Progress Notes (Unsigned)
Laneka's bone biopsy was negative. I have discussed this with interventional radiology, and they have reviewed their films. They confirmed that the needle was exactly in the place where it needed to be, so there is no real possibility of having missed the lesion. The problem of course if we don't have another explanation for the findings in that area. Nevertheless the biopsy is negative.  I do not believe repeat biopsy at this point would be useful. I think we need to follow this carefully and she will have a repeat MRI of this area in 3 months.  She did not receive her Zometa, which can sometimes uncover additional areas of bone disease. We will consider repeating a bone scan also at the 3 month point.  In the meantime she is already scheduled for wide excision of her skin recurrence February 10, and for her next goserelin treatment February 15. She continues on anastrozole. She did not receive zolendronate but she will receive it on the 15th. She will see me the 19th intercostal this, but I have sent a note to Drs. Valere Dross and [place] regarding this plan and I also called her at home and left her message letting her know that her biopsy was negative.

## 2014-05-30 ENCOUNTER — Other Ambulatory Visit: Payer: Self-pay | Admitting: Oncology

## 2014-05-30 ENCOUNTER — Telehealth: Payer: Self-pay | Admitting: *Deleted

## 2014-05-30 ENCOUNTER — Telehealth: Payer: Self-pay | Admitting: Oncology

## 2014-05-30 NOTE — Telephone Encounter (Signed)
, °

## 2014-05-30 NOTE — Telephone Encounter (Signed)
Patient called reporting she has been scheduled for zometa and is to receive Zoladex too.  I am to have surgery on Wednesday 06-01-2014 at the [place] [facility].  "There are a lot of things happening.  I do not understand why and am not sure I want these treatments until I talk with Dr. [name] to understand all the pieces.  I need a better understanding of the bone scan results after the PET scan.  I can see him the morning of 06-06-2014 or anytime to discuss things."   Asked if she a phone call is good if no appointments are available and she said "yes if they can co-ordinate schedules".  Denies any appointments to hinder a call and can be reached at [phone].

## 2014-05-30 NOTE — Telephone Encounter (Signed)
Per staff message and POF I have scheduled appts. Advised scheduler of appts. JMW  

## 2014-05-30 NOTE — Progress Notes (Unsigned)
I called Erin [name] to discuss her situation with her. She understands that on the one hand the biopsy of her bone was demonstrably obtained from the correct spot (we have reviewed those films). At the same time I don't have an explanation for what it is there that is abnormal. What we are going to do is treat that with zolendronate, and of course her antiestrogen therapy would also be active if it was cancer.  We discussed the difference between zolendronate and Xgeva and also the way that goserelin works. I expect her menopausal symptoms will start with her second shot of goserelin which is scheduled for every 15th. I made her an appointment to see me at 7:30 that morning to discuss this in more detail.

## 2014-05-31 ENCOUNTER — Telehealth: Payer: Self-pay | Admitting: Oncology

## 2014-05-31 NOTE — Telephone Encounter (Signed)
per pof to sch pt appt @ 7:30 am per GM-cld & left pt a message and adv of times of appts

## 2014-06-01 ENCOUNTER — Encounter (HOSPITAL_BASED_OUTPATIENT_CLINIC_OR_DEPARTMENT_OTHER): Payer: Self-pay | Admitting: *Deleted

## 2014-06-01 ENCOUNTER — Ambulatory Visit (HOSPITAL_BASED_OUTPATIENT_CLINIC_OR_DEPARTMENT_OTHER): Payer: BC Managed Care – PPO | Admitting: Anesthesiology

## 2014-06-01 ENCOUNTER — Ambulatory Visit (HOSPITAL_BASED_OUTPATIENT_CLINIC_OR_DEPARTMENT_OTHER)
Admission: RE | Admit: 2014-06-01 | Discharge: 2014-06-01 | Disposition: A | Discharge status: Home / Self Care | Payer: BC Managed Care – PPO | Source: Ambulatory Visit | Attending: General Surgery | Admitting: General Surgery

## 2014-06-01 ENCOUNTER — Encounter (HOSPITAL_BASED_OUTPATIENT_CLINIC_OR_DEPARTMENT_OTHER)
Admission: RE | Disposition: A | Discharge status: Home / Self Care | Payer: Self-pay | Source: Ambulatory Visit | Attending: General Surgery

## 2014-06-01 ENCOUNTER — Telehealth: Payer: Self-pay | Admitting: Oncology

## 2014-06-01 DIAGNOSIS — E039 Hypothyroidism, unspecified: Secondary | ICD-10-CM | POA: Insufficient documentation

## 2014-06-01 DIAGNOSIS — M1990 Unspecified osteoarthritis, unspecified site: Secondary | ICD-10-CM | POA: Diagnosis not present

## 2014-06-01 DIAGNOSIS — C50912 Malignant neoplasm of unspecified site of left female breast: Secondary | ICD-10-CM | POA: Insufficient documentation

## 2014-06-01 DIAGNOSIS — Z79899 Other long term (current) drug therapy: Secondary | ICD-10-CM | POA: Insufficient documentation

## 2014-06-01 DIAGNOSIS — I10 Essential (primary) hypertension: Secondary | ICD-10-CM | POA: Insufficient documentation

## 2014-06-01 DIAGNOSIS — Z9013 Acquired absence of bilateral breasts and nipples: Secondary | ICD-10-CM | POA: Diagnosis not present

## 2014-06-01 HISTORY — PX: BREAST LUMPECTOMY: SHX2

## 2014-06-01 HISTORY — DX: Presence of spectacles and contact lenses: Z97.3

## 2014-06-01 LAB — POCT HEMOGLOBIN-HEMACUE: Hemoglobin: 13.2 g/dL (ref 12.0–15.0)

## 2014-06-01 SURGERY — BREAST LUMPECTOMY
Anesthesia: General | Site: Breast | Laterality: Left

## 2014-06-01 MED ORDER — HYDROMORPHONE HCL 1 MG/ML IJ SOLN
0.2500 mg | INTRAMUSCULAR | Status: DC | PRN
  Administered 2014-06-01: 0.25 mg via INTRAVENOUS

## 2014-06-01 MED ORDER — CEFAZOLIN SODIUM-DEXTROSE 2-3 GM-% IV SOLR
INTRAVENOUS | Status: DC | PRN
  Administered 2014-06-01: 2 g via INTRAVENOUS

## 2014-06-01 MED ORDER — MIDAZOLAM HCL 5 MG/5ML IJ SOLN
INTRAMUSCULAR | Status: DC | PRN
  Administered 2014-06-01: 2 mg via INTRAVENOUS

## 2014-06-01 MED ORDER — FENTANYL CITRATE 0.05 MG/ML IJ SOLN
INTRAMUSCULAR | Status: AC
  Filled 2014-06-01: qty 6

## 2014-06-01 MED ORDER — MIDAZOLAM HCL 2 MG/2ML IJ SOLN: 1.0000 mg | INTRAMUSCULAR | Status: DC | PRN

## 2014-06-01 MED ORDER — CEFAZOLIN SODIUM-DEXTROSE 2-3 GM-% IV SOLR: 2.0000 g | INTRAVENOUS | Status: DC

## 2014-06-01 MED ORDER — OXYCODONE-ACETAMINOPHEN 10-325 MG PO TABS: 1.0000 | ORAL_TABLET | Freq: Four times a day (QID) | ORAL | Status: DC | PRN

## 2014-06-01 MED ORDER — MIDAZOLAM HCL 2 MG/2ML IJ SOLN
INTRAMUSCULAR | Status: AC
  Filled 2014-06-01: qty 2

## 2014-06-01 MED ORDER — BUPIVACAINE HCL (PF) 0.25 % IJ SOLN
INTRAMUSCULAR | Status: AC
  Filled 2014-06-01: qty 30

## 2014-06-01 MED ORDER — FENTANYL CITRATE 0.05 MG/ML IJ SOLN
INTRAMUSCULAR | Status: DC | PRN
  Administered 2014-06-01 (×2): 50 ug via INTRAVENOUS

## 2014-06-01 MED ORDER — BUPIVACAINE-EPINEPHRINE (PF) 0.25% -1:200000 IJ SOLN
INTRAMUSCULAR | Status: AC
Start: 2014-06-01 — End: 2014-06-01
  Filled 2014-06-01: qty 30

## 2014-06-01 MED ORDER — BUPIVACAINE HCL (PF) 0.25 % IJ SOLN
INTRAMUSCULAR | Status: DC | PRN
  Administered 2014-06-01: 10 mL

## 2014-06-01 MED ORDER — HYDROMORPHONE HCL 1 MG/ML IJ SOLN
INTRAMUSCULAR | Status: AC
  Filled 2014-06-01: qty 1

## 2014-06-01 MED ORDER — PROMETHAZINE HCL 25 MG/ML IJ SOLN: 6.2500 mg | INTRAMUSCULAR | Status: DC | PRN

## 2014-06-01 MED ORDER — LIDOCAINE HCL (CARDIAC) 20 MG/ML IV SOLN
INTRAVENOUS | Status: DC | PRN
  Administered 2014-06-01: 50 mg via INTRAVENOUS

## 2014-06-01 MED ORDER — CEFAZOLIN SODIUM-DEXTROSE 2-3 GM-% IV SOLR
INTRAVENOUS | Status: AC
  Filled 2014-06-01: qty 50

## 2014-06-01 MED ORDER — FENTANYL CITRATE 0.05 MG/ML IJ SOLN: 50.0000 ug | INTRAMUSCULAR | Status: DC | PRN

## 2014-06-01 MED ORDER — PROPOFOL 10 MG/ML IV BOLUS
INTRAVENOUS | Status: DC | PRN
  Administered 2014-06-01: 200 mg via INTRAVENOUS

## 2014-06-01 MED ORDER — LIDOCAINE HCL (PF) 1 % IJ SOLN
INTRAMUSCULAR | Status: AC
  Filled 2014-06-01: qty 30

## 2014-06-01 MED ORDER — DEXAMETHASONE SODIUM PHOSPHATE 4 MG/ML IJ SOLN
INTRAMUSCULAR | Status: DC | PRN
  Administered 2014-06-01: 10 mg via INTRAVENOUS

## 2014-06-01 MED ORDER — ONDANSETRON HCL 4 MG/2ML IJ SOLN
INTRAMUSCULAR | Status: DC | PRN
  Administered 2014-06-01: 4 mg via INTRAVENOUS

## 2014-06-01 MED ORDER — LACTATED RINGERS IV SOLN
INTRAVENOUS | Status: DC
  Administered 2014-06-01: 08:00:00 via INTRAVENOUS

## 2014-06-01 SURGICAL SUPPLY — 55 items
APPLIER CLIP 9.375 MED OPEN (MISCELLANEOUS)
BENZOIN TINCTURE PRP APPL 2/3 (GAUZE/BANDAGES/DRESSINGS) IMPLANT
BINDER BREAST LRG (GAUZE/BANDAGES/DRESSINGS) IMPLANT
BINDER BREAST MEDIUM (GAUZE/BANDAGES/DRESSINGS) ×3 IMPLANT
BINDER BREAST XLRG (GAUZE/BANDAGES/DRESSINGS) IMPLANT
BINDER BREAST XXLRG (GAUZE/BANDAGES/DRESSINGS) IMPLANT
BLADE SURG 15 STRL LF DISP TIS (BLADE) ×1 IMPLANT
BLADE SURG 15 STRL SS (BLADE) ×2
CANISTER SUCT 1200ML W/VALVE (MISCELLANEOUS) IMPLANT
CHLORAPREP W/TINT 26ML (MISCELLANEOUS) ×3 IMPLANT
CLIP APPLIE 9.375 MED OPEN (MISCELLANEOUS) IMPLANT
CLOSURE WOUND 1/2 X4 (GAUZE/BANDAGES/DRESSINGS) ×1
COVER BACK TABLE 60X90IN (DRAPES) ×3 IMPLANT
COVER MAYO STAND STRL (DRAPES) ×3 IMPLANT
DECANTER SPIKE VIAL GLASS SM (MISCELLANEOUS) IMPLANT
DEVICE DUBIN W/COMP PLATE 8390 (MISCELLANEOUS) IMPLANT
DRAPE LAPAROTOMY 100X72 PEDS (DRAPES) ×3 IMPLANT
DRAPE UTILITY XL STRL (DRAPES) ×3 IMPLANT
DRSG TEGADERM 4X4.75 (GAUZE/BANDAGES/DRESSINGS) ×3 IMPLANT
ELECT BLADE 4.0 EZ CLEAN MEGAD (MISCELLANEOUS)
ELECT COATED BLADE 2.86 ST (ELECTRODE) ×3 IMPLANT
ELECT REM PT RETURN 9FT ADLT (ELECTROSURGICAL) ×3
ELECTRODE BLDE 4.0 EZ CLN MEGD (MISCELLANEOUS) IMPLANT
ELECTRODE REM PT RTRN 9FT ADLT (ELECTROSURGICAL) ×1 IMPLANT
GLOVE BIO SURGEON STRL SZ7 (GLOVE) ×3 IMPLANT
GLOVE BIOGEL PI IND STRL 7.5 (GLOVE) ×1 IMPLANT
GLOVE BIOGEL PI INDICATOR 7.5 (GLOVE) ×2
GOWN STRL REUS W/ TWL LRG LVL3 (GOWN DISPOSABLE) ×3 IMPLANT
GOWN STRL REUS W/TWL LRG LVL3 (GOWN DISPOSABLE) ×6
KIT MARKER MARGIN INK (KITS) ×3 IMPLANT
LIQUID BAND (GAUZE/BANDAGES/DRESSINGS) IMPLANT
MARKER SKIN DUAL TIP RULER LAB (MISCELLANEOUS) ×6 IMPLANT
NEEDLE HYPO 25X1 1.5 SAFETY (NEEDLE) ×3 IMPLANT
NS IRRIG 1000ML POUR BTL (IV SOLUTION) IMPLANT
PACK BASIN DAY SURGERY FS (CUSTOM PROCEDURE TRAY) ×3 IMPLANT
PENCIL BUTTON HOLSTER BLD 10FT (ELECTRODE) ×3 IMPLANT
SLEEVE SCD COMPRESS KNEE MED (MISCELLANEOUS) ×3 IMPLANT
SPONGE GAUZE 4X4 12PLY STER LF (GAUZE/BANDAGES/DRESSINGS) ×3 IMPLANT
SPONGE LAP 4X18 X RAY DECT (DISPOSABLE) ×3 IMPLANT
STRIP CLOSURE SKIN 1/2X4 (GAUZE/BANDAGES/DRESSINGS) ×2 IMPLANT
SUT MNCRL AB 4-0 PS2 18 (SUTURE) IMPLANT
SUT MON AB 5-0 PS2 18 (SUTURE) IMPLANT
SUT SILK 2 0 SH (SUTURE) ×3 IMPLANT
SUT VIC AB 2-0 SH 27 (SUTURE) ×2
SUT VIC AB 2-0 SH 27XBRD (SUTURE) ×1 IMPLANT
SUT VIC AB 3-0 SH 27 (SUTURE) ×2
SUT VIC AB 3-0 SH 27X BRD (SUTURE) ×1 IMPLANT
SUT VIC AB 5-0 PS2 18 (SUTURE) IMPLANT
SUT VICRYL AB 3 0 TIES (SUTURE) IMPLANT
SYR CONTROL 10ML LL (SYRINGE) ×3 IMPLANT
TOWEL OR 17X24 6PK STRL BLUE (TOWEL DISPOSABLE) ×3 IMPLANT
TOWEL OR NON WOVEN STRL DISP B (DISPOSABLE) ×3 IMPLANT
TUBE CONNECTING 20'X1/4 (TUBING)
TUBE CONNECTING 20X1/4 (TUBING) IMPLANT
YANKAUER SUCT BULB TIP NO VENT (SUCTIONS) IMPLANT

## 2014-06-01 NOTE — Transfer of Care (Signed)
Immediate Anesthesia Transfer of Care Note  Patient: Erin [name]  Procedure(s) Performed: Procedure(s): WIDE LOCAL EXCISION OF RECURRENT LEFT BREAST CANCER (Left)  Patient Location: PACU  Anesthesia Type:General  Level of Consciousness: awake and patient cooperative  Airway & Oxygen Therapy: Patient Spontanous Breathing and Patient connected to face mask oxygen  Post-op Assessment: Report given to RN and Post -op Vital signs reviewed and stable  Post vital signs: Reviewed and stable  Last Vitals:  Filed Vitals:   06/01/14 0659  BP: 130/79  Pulse: 77  Temp: 36.8 C  Resp: 18    Complications: No apparent anesthesia complications

## 2014-06-01 NOTE — Op Note (Addendum)
Preoperative diagnosis: recurrent left breast cancer, chest wall, s/p mastectomy Postoperative diagnosis: same as above Procedure: wide local excision of recurrent left breast cancer, 7x4x2 cm of skin and subcutaneous/breast tissue Surgeon: Dr Serita Grammes Anesthesia: general EBL: minimal Specimen: left chest wall cancer short superior, long lateral, double deep Drains: none Complications: none Sponge and needle count correct Dispo to pacu stable  Indications: This is a 83 yof who underwent mastectomy previously at outside facility who presents with local recurrence in her chest wall. She also has a possible metastatic lesion. Her axillary [place] did not show any nodal disease.  We discussed wide local excision.  Procedure: After informed consent the patient was taken to the operating room. She and I had marked this previously.  She was given cefazolin.  She had scds in place.  She was placed under anesthesia without complication. Her left chest was prepped and draped in the standard sterile surgical fashion.  A timeout was performed.  I made an elliptical incision to encompass the entire area of the lateral portion of her old mastectomy incision including the site of the biopsy.  I then removed this in its entirety down to pectoralis muscle. There was not a large mass associated with this.  This was marked as above.  I then obtained hemostasis.  I closed with 3-0 vicryl, 4-0 monocryl and glue. A binder was placed. She tolerated well, was extubated and transferred to recovery stable.

## 2014-06-01 NOTE — Anesthesia Preprocedure Evaluation (Signed)
Anesthesia Evaluation  Patient identified by MRN, date of birth, ID band  Reviewed: Allergy & Precautions, NPO status , Patient's Chart, lab work & pertinent test results  Airway Mallampati: II  TM Distance: >3 FB Neck ROM: Full    Dental   Pulmonary neg pulmonary ROS,  breath sounds clear to auscultation        Cardiovascular hypertension, Rhythm:Regular Rate:Normal     Neuro/Psych    GI/Hepatic negative GI ROS, Neg liver ROS,   Endo/Other  Hypothyroidism   Renal/GU negative Renal ROS     Musculoskeletal   Abdominal   Peds  Hematology   Anesthesia Other Findings   Reproductive/Obstetrics                             Anesthesia Physical Anesthesia Plan  ASA: III  Anesthesia Plan: General   Post-op Pain Management:    Induction: Intravenous  Airway Management Planned: LMA  Additional Equipment:   Intra-op Plan:   Post-operative Plan: Extubation in OR  Informed Consent: I have reviewed the patients History and Physical, chart, labs and discussed the procedure including the risks, benefits and alternatives for the proposed anesthesia with the patient or authorized representative who has indicated his/her understanding and acceptance.   Dental advisory given  Plan Discussed with: CRNA and Anesthesiologist  Anesthesia Plan Comments:         Anesthesia Quick Evaluation

## 2014-06-01 NOTE — Telephone Encounter (Signed)
per pof to CX pt appt per GM-pt has updated appt times

## 2014-06-01 NOTE — Anesthesia Procedure Notes (Signed)
Procedure Name: LMA Insertion Date/Time: 06/01/2014 8:35 AM Performed by: Toula Moos L Pre-anesthesia Checklist: Patient identified, Emergency Drugs available, Suction available, Patient being monitored and Timeout performed Patient Re-evaluated:Patient Re-evaluated prior to inductionOxygen Delivery Method: Circle System Utilized Preoxygenation: Pre-oxygenation with 100% oxygen Intubation Type: IV induction Ventilation: Mask ventilation without difficulty LMA: LMA inserted LMA Size: 4.0 Number of attempts: 1 Airway Equipment and Method: Bite block Placement Confirmation: positive ETCO2 Tube secured with: Tape Dental Injury: Teeth and Oropharynx as per pre-operative assessment

## 2014-06-01 NOTE — H&P (Signed)
Erin [name] who presents for left chest wall recurrence. She had initial breast cancer diagnosed in late 2009 after abnormal mm. She by choice eventually underwent bilateral mastectomies with left ax sn biopsy. 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 there was extensive LVI but nodes negative. She did receive postmastectomy radiotherapy due to margin it appears. oncotype dx was 36. she did not undergo reconstruction. She took tamoxifen for 5 years but stopped about 6 months ago due to problems with endometrial hyperplasia. She noted about 2 years ago a change near the lateral portion of her left mastectomy scar. this was followed and eventually was biopsied by Dr Tonia Brooms. biopsy showed ductal carcinomathat is positive apparently for er and pr. She has undergone pet scan that shows a sclerotic lesion in L3 and a sclerotic lesion in the right ischial tuberosity. She has mri and bone scan pending as well as possible biopsies of those lesions to evaluate for metastatic disease. She comes to see me today to discuss wle of recurrence. she reports no real complaints   Other Problems Marjean Donna, CMA; 05/10/2014 3:40 PM) Arthritis Back Pain Breast Cancer High blood pressure Lump In Breast Other disease, cancer, significant illness Thyroid Disease Umbilical Hernia Repair  Past Surgical History Marjean Donna, CMA; 05/10/2014 3:40 PM) Breast Biopsy Bilateral. Colon Polyp Removal - Colonoscopy Knee Surgery Right. Mastectomy Bilateral. Oral Surgery Sentinel Lymph Node Biopsy Tonsillectomy   Allergies Davy Pique Bynum, CMA; 05/10/2014 3:41 PM) Adhesive Tape *MEDICAL DEVICES AND SUPPLIES*  Medication History Davy Pique Bynum, CMA; 05/10/2014 3:41 PM) Levothyroxine Sodium (75MCG Tablet, Oral) Active. Losartan Potassium (25MG  Tablet, Oral)  Active.  Social History Marjean Donna, CMA; 05/10/2014 3:40 PM) Alcohol use Occasional alcohol use. Caffeine use Carbonated beverages, Tea. No drug use Tobacco use Never smoker.  Review of Systems ([place]; 05/10/2014 3:40 PM) General Not Present- Appetite Loss, Chills, Fatigue, Fever, Night Sweats, Weight Gain and Weight Loss. Skin Not Present- Change in Wart/Mole, Dryness, Hives, Jaundice, New Lesions, Non-Healing Wounds, Rash and Ulcer. HEENT Not Present- Earache, Hearing Loss, Hoarseness, Nose Bleed, Oral Ulcers, Ringing in the Ears, Seasonal Allergies, Sinus Pain, Sore Throat, Visual Disturbances, Wears glasses/contact lenses and Yellow Eyes. Respiratory Not Present- Bloody sputum, Chronic Cough, Difficulty Breathing, Snoring and Wheezing. Breast Present- Breast Pain and Skin Changes. Not Present- Breast Mass and Nipple Discharge. Cardiovascular Not Present- Chest Pain, Difficulty Breathing Lying Down, Leg Cramps, Palpitations, Rapid Heart Rate, Shortness of Breath and Swelling of Extremities. Gastrointestinal Not Present- Abdominal Pain, Bloating, Bloody Stool, Change in Bowel Habits, Chronic diarrhea, Constipation, Difficulty Swallowing, Excessive gas, Gets full quickly at meals, Hemorrhoids, Indigestion, Nausea, Rectal Pain and Vomiting. Female Genitourinary Not Present- Frequency, Nocturia, Painful Urination, Pelvic Pain and Urgency. Musculoskeletal Not Present- Back Pain, Joint Pain, Joint Stiffness, Muscle Pain, Muscle Weakness and Swelling of Extremities. Neurological Not Present- Decreased Memory, Fainting, Headaches, Numbness, Seizures, Tingling, Tremor, Trouble walking and Weakness. Psychiatric Not Present- Anxiety, Bipolar, Change in Sleep Pattern, Depression, Fearful and Frequent crying. Endocrine Not Present- Cold Intolerance, Excessive Hunger, Hair Changes, Heat Intolerance, Hot flashes and New Diabetes. Hematology Present- Easy Bruising. Not Present- Excessive  bleeding, Gland problems, HIV and Persistent Infections.   Vitals (Sonya Bynum CMA; 05/10/2014 3:41 PM) 05/10/2014 3:41 PM Weight: 156 lb Height: 65in Body Surface Area: 1.8 m Body Mass Index: 25.96 kg/m Temp.: 97.31F(Temporal)  Pulse: 74 (Regular)  BP: 120/76 (Sitting, Left Arm, Standard)  Physical Exam Rolm Bookbinder MD; 05/10/2014 5:13 PM) General Mental Status-Alert. Orientation-Oriented X3.  Chest and Lung Exam Chest and lung exam reveals -on auscultation, normal breath sounds, no adventitious sounds and normal vocal resonance.  Breast Breast - Left-Mastectomy scar. Breast - Right-Mastectomy scar.   Cardiovascular Cardiovascular examination reveals -normal heart sounds, regular rate and rhythm with no murmurs.  Lymphatic General Lymphatics -Note: no [place] adenopathy.  Head & Neck  General Head & Neck Lymphatics: Bilateral - Description - Normal. Axillary  General Axillary Region: Right - Description - Normal. Note: shotty left axillary lad  Assessment & Plan Rolm Bookbinder MD; 05/10/2014 9:15 PM) CHEST WALL RECURRENCE OF BREAST CANCER, LEFT (174.8  C50.912) Impression: I think she needs to be proven to have stage IV disease. I would also like to do [place] of recurrence to make sure it really is localized as it appears. Will also do [place] of left axilla to evaluate for nodes. If she does not have stage IV disease this will be more important. we discussed wide local excision of the recurrence today with postop recovery and issues with would healing. will plan on scheduling her in next couple weeks as some of the rest of her evaluation is completed.

## 2014-06-01 NOTE — Interval H&P Note (Signed)
History and Physical Interval Note:  06/01/2014 8:20 AM  Erin [name]  has presented today for surgery, with the diagnosis of RECURRENT LEFT BREAST CANCER  The various methods of treatment have been discussed with the patient and family. After consideration of risks, benefits and other options for treatment, the patient has consented to  Procedure(s): WIDE LOCAL EXCISION OF RECURRENT LEFT BREAST CANCER (Left) as a surgical intervention .  The patient's history has been reviewed, patient examined, no change in status, stable for surgery.  I have reviewed the patient's chart and labs.  Questions were answered to the patient's satisfaction.     Hall Birchard

## 2014-06-01 NOTE — Discharge Instructions (Signed)
[facility], [place] [phone]   POST OP INSTRUCTIONS  Always review your discharge instruction sheet given to you by the facility where your surgery was performed. IF YOU HAVE DISABILITY OR FAMILY LEAVE FORMS, YOU MUST BRING THEM TO THE OFFICE FOR PROCESSING.   DO NOT GIVE THEM TO YOUR DOCTOR. A prescription for pain medication may be given to you upon discharge.  Take your pain medication as prescribed, if needed.  If narcotic pain medicine is not needed, then you may take acetaminophen (Tylenol), naprosyn (Alleve) or ibuprofen (Advil) as needed. 1. Take your usually prescribed medications unless otherwise directed. 2. If you need a refill on your pain medication, please contact your pharmacy.  They will contact our office to request authorization.  Prescriptions will not be filled after 5pm or on week-ends. 3. You should follow a light diet the first few days after arrival home, such as soup and crackers, etc.  Resume your normal diet the day after surgery. 4. Most patients will experience some swelling and bruising on the chest and underarm.  Ice packs will help.  Swelling and bruising can take several days to resolve. Wear the binder day and night for 72 hours. 5. It is common to experience some constipation if taking pain medication after surgery.  Increasing fluid intake and taking a stool softener (such as Colace) will usually help or prevent this problem from occurring.  A mild laxative (Milk of Magnesia or Miralax) should be taken according to package instructions if there are no bowel movements after 48 hours. 6. Unless discharge instructions indicate otherwise, leave your bandage dry and in place until your next appointment in 3-5 days.  You may take a limited sponge bath.  No tube baths or showers until the drains are removed.  You may have steri-strips (small skin tapes) in place directly over the incision.  These strips should be left on the skin for 7-10 days. If you have  glue it will come off in next couple week.  Any sutures will be removed at an office visit 7. DRAINS:  If you have drains in place, it is important to keep a list of the amount of drainage produced each day in your drains.  Before leaving the hospital, you should be instructed on drain care.  Call our office if you have any questions about your drains. I will remove your drains when they put out less than 30 cc or ml for 2 consecutive days. 8. ACTIVITIES:  You may resume regular (light) daily activities beginning the next day--such as daily self-care, walking, climbing stairs--gradually increasing activities as tolerated.  You may have sexual intercourse when it is comfortable.  Refrain from any heavy lifting or straining until approved by your doctor. a. You may drive when you are no longer taking prescription pain medication, you can comfortably wear a seatbelt, and you can safely maneuver your car and apply brakes. b. RETURN TO WORK:  __________________________________________________________ 9. You should see your doctor in the office for a follow-up appointment approximately 3-5 days after your surgery.  Your doctors nurse will typically make your follow-up appointment when she calls you with your pathology report.  Expect your pathology report 3-4business days after surgery. 10. OTHER INSTRUCTIONS: ______________________________________________________________________________________________ ____________________________________________________________________________________________ WHEN TO CALL YOUR DR [place]: 1. Fever over 101.0 2. Nausea and/or vomiting 3. Extreme swelling or bruising 4. Continued bleeding from incision. 5. Increased pain, redness, or drainage from the incision. The clinic staff is available to answer your questions  during regular business hours.  Please dont hesitate to call and ask to speak to one of the nurses for clinical concerns.  If you have a medical emergency, go  to the nearest emergency room or call 911.  A surgeon from [facility] is always on call at the hospital. [address], [place], [place], [place]  [address] ? [address], [place], [place]   [address] [phone] ? [phone] ? FAX [phone] Web site: [email or web address]    Post Anesthesia Home Care Instructions  Activity: Get plenty of rest for the remainder of the day. A responsible adult should stay with you for 24 hours following the procedure.  For the next 24 hours, DO NOT: -Drive a car -Paediatric nurse -Drink alcoholic beverages -Take any medication unless instructed by your physician -Make any legal decisions or sign important papers.  Meals: Start with liquid foods such as gelatin or soup. Progress to regular foods as tolerated. Avoid greasy, spicy, heavy foods. If nausea and/or vomiting occur, drink only clear liquids until the nausea and/or vomiting subsides. Call your physician if vomiting continues.  Special Instructions/Symptoms: Your throat may feel dry or sore from the anesthesia or the breathing tube placed in your throat during surgery. If this causes discomfort, gargle with warm salt water. The discomfort should disappear within 24 hours.

## 2014-06-01 NOTE — Anesthesia Postprocedure Evaluation (Signed)
Anesthesia Post Note  Patient: Erin [name]  Procedure(s) Performed: Procedure(s) (LRB): WIDE LOCAL EXCISION OF RECURRENT LEFT BREAST CANCER (Left)  Anesthesia type: General  Patient location: PACU  Post pain: Pain level controlled and Adequate analgesia  Post assessment: Post-op Vital signs reviewed, Patient's Cardiovascular Status Stable, Respiratory Function Stable, Patent Airway and Pain level controlled  Last Vitals:  Filed Vitals:   06/01/14 1025  BP: 126/74  Pulse: 71  Temp: 36.6 C  Resp: 20    Post vital signs: Reviewed and stable  Level of consciousness: awake, alert  and oriented  Complications: No apparent anesthesia complications

## 2014-06-02 ENCOUNTER — Encounter (HOSPITAL_BASED_OUTPATIENT_CLINIC_OR_DEPARTMENT_OTHER): Payer: Self-pay | Admitting: General Surgery

## 2014-06-06 ENCOUNTER — Other Ambulatory Visit (HOSPITAL_BASED_OUTPATIENT_CLINIC_OR_DEPARTMENT_OTHER): Payer: BC Managed Care – PPO

## 2014-06-06 ENCOUNTER — Other Ambulatory Visit: Payer: Self-pay | Admitting: *Deleted

## 2014-06-06 ENCOUNTER — Ambulatory Visit (HOSPITAL_BASED_OUTPATIENT_CLINIC_OR_DEPARTMENT_OTHER): Payer: BC Managed Care – PPO | Admitting: Oncology

## 2014-06-06 ENCOUNTER — Telehealth: Payer: Self-pay | Admitting: *Deleted

## 2014-06-06 ENCOUNTER — Ambulatory Visit: Payer: BC Managed Care – PPO

## 2014-06-06 ENCOUNTER — Telehealth: Payer: Self-pay | Admitting: Oncology

## 2014-06-06 ENCOUNTER — Ambulatory Visit (HOSPITAL_BASED_OUTPATIENT_CLINIC_OR_DEPARTMENT_OTHER): Payer: BC Managed Care – PPO

## 2014-06-06 VITALS — BP 139/79 | HR 91 | Temp 98.7°F | Resp 19 | Ht 65.0 in | Wt 151.8 lb

## 2014-06-06 DIAGNOSIS — C50919 Malignant neoplasm of unspecified site of unspecified female breast: Secondary | ICD-10-CM

## 2014-06-06 DIAGNOSIS — C50412 Malignant neoplasm of upper-outer quadrant of left female breast: Secondary | ICD-10-CM

## 2014-06-06 DIAGNOSIS — C50912 Malignant neoplasm of unspecified site of left female breast: Secondary | ICD-10-CM

## 2014-06-06 DIAGNOSIS — C44501 Unspecified malignant neoplasm of skin of breast: Secondary | ICD-10-CM

## 2014-06-06 DIAGNOSIS — N8500 Endometrial hyperplasia, unspecified: Secondary | ICD-10-CM

## 2014-06-06 DIAGNOSIS — M8580 Other specified disorders of bone density and structure, unspecified site: Secondary | ICD-10-CM

## 2014-06-06 DIAGNOSIS — Z5111 Encounter for antineoplastic chemotherapy: Secondary | ICD-10-CM

## 2014-06-06 LAB — COMPREHENSIVE METABOLIC PANEL (CC13)
ALBUMIN: 3.9 g/dL (ref 3.5–5.0)
ALT: 20 U/L (ref 0–55)
ANION GAP: 10 meq/L (ref 3–11)
AST: 22 U/L (ref 5–34)
Alkaline Phosphatase: 78 U/L (ref 40–150)
BUN: 14.3 mg/dL (ref 7.0–26.0)
CALCIUM: 9.1 mg/dL (ref 8.4–10.4)
CO2: 25 mEq/L (ref 22–29)
Chloride: 102 mEq/L (ref 98–109)
Creatinine: 0.8 mg/dL (ref 0.6–1.1)
EGFR: 87 mL/min/{1.73_m2} — ABNORMAL LOW (ref >90)
GLUCOSE: 91 mg/dL (ref 70–140)
Potassium: 3.9 mEq/L (ref 3.5–5.1)
Sodium: 136 mEq/L (ref 136–145)
Total Bilirubin: 1.28 mg/dL — ABNORMAL HIGH (ref 0.20–1.20)
Total Protein: 6.8 g/dL (ref 6.4–8.3)

## 2014-06-06 LAB — CBC WITH DIFFERENTIAL/PLATELET
BASO%: 0.8 % (ref 0.0–2.0)
BASOS ABS: 0.1 10*3/uL (ref 0.0–0.1)
EOS%: 1.6 % (ref 0.0–7.0)
Eosinophils Absolute: 0.1 10*3/uL (ref 0.0–0.5)
HCT: 43.8 % (ref 34.8–46.6)
HGB: 14.7 g/dL (ref 11.6–15.9)
LYMPH%: 16.7 % (ref 14.0–49.7)
MCH: 28.2 pg (ref 25.1–34.0)
MCHC: 33.6 g/dL (ref 31.5–36.0)
MCV: 83.9 fL (ref 79.5–101.0)
MONO#: 0.5 10*3/uL (ref 0.1–0.9)
MONO%: 8.8 % (ref 0.0–14.0)
NEUT#: 4.4 10*3/uL (ref 1.5–6.5)
NEUT%: 72.1 % (ref 38.4–76.8)
Platelets: 228 10*3/uL (ref 145–400)
RBC: 5.22 10*6/uL (ref 3.70–5.45)
RDW: 13.7 % (ref 11.2–14.5)
WBC: 6.2 10*3/uL (ref 3.9–10.3)
lymph#: 1 10*3/uL (ref 0.9–3.3)

## 2014-06-06 MED ORDER — GOSERELIN ACETATE 3.6 MG ~~LOC~~ IMPL
3.6000 mg | DRUG_IMPLANT | Freq: Once | SUBCUTANEOUS | Status: AC
  Administered 2014-06-06: 3.6 mg via SUBCUTANEOUS
  Filled 2014-06-06: qty 3.6

## 2014-06-06 MED ORDER — ZOLEDRONIC ACID 4 MG/100ML IV SOLN
4.0000 mg | Freq: Once | INTRAVENOUS | Status: DC
  Filled 2014-06-06: qty 100

## 2014-06-06 NOTE — Patient Instructions (Signed)
Goserelin injection What is this medicine? GOSERELIN (GOE se rel in) is similar to a hormone found in the body. It lowers the amount of sex hormones that the body makes. Men will have lower testosterone levels and women will have lower estrogen levels while taking this medicine. In men, this medicine is used to treat prostate cancer; the injection is either given once per month or once every 12 weeks. A once per month injection (only) is used to treat women with endometriosis, dysfunctional uterine bleeding, or advanced breast cancer. This medicine may be used for other purposes; ask your health care provider or pharmacist if you have questions. COMMON BRAND NAME(S): Zoladex What should I tell my health care provider before I take this medicine? They need to know if you have any of these conditions (some only apply to women): -diabetes -heart disease or previous heart attack -high blood pressure -high cholesterol -kidney disease -osteoporosis or low bone density -problems passing urine -spinal cord injury -stroke -tobacco smoker -an unusual or allergic reaction to goserelin, hormone therapy, other medicines, foods, dyes, or preservatives -pregnant or trying to get pregnant -breast-feeding How should I use this medicine? This medicine is for injection under the skin. It is given by a health care professional in a hospital or clinic setting. Men receive this injection once every 4 weeks or once every 12 weeks. Women will only receive the once every 4 weeks injection. Talk to your pediatrician regarding the use of this medicine in children. Special care may be needed. Overdosage: If you think you have taken too much of this medicine contact a poison control center or emergency room at once. NOTE: This medicine is only for you. Do not share this medicine with others. What if I miss a dose? It is important not to miss your dose. Call your doctor or health care professional if you are unable to  keep an appointment. What may interact with this medicine? -female hormones like estrogen -herbal or dietary supplements like black cohosh, chasteberry, or DHEA -female hormones like testosterone -prasterone This list may not describe all possible interactions. Give your health care provider a list of all the medicines, herbs, non-prescription drugs, or dietary supplements you use. Also tell them if you smoke, drink alcohol, or use illegal drugs. Some items may interact with your medicine. What should I watch for while using this medicine? Visit your doctor or health care professional for regular checks on your progress. Your symptoms may appear to get worse during the first weeks of this therapy. Tell your doctor or healthcare professional if your symptoms do not start to get better or if they get worse after this time. Your bones may get weaker if you take this medicine for a long time. If you smoke or frequently drink alcohol you may increase your risk of bone loss. A family history of osteoporosis, chronic use of drugs for seizures (convulsions), or corticosteroids can also increase your risk of bone loss. Talk to your doctor about how to keep your bones strong. This medicine should stop regular monthly menstration in women. Tell your doctor if you continue to menstrate. Women should not become pregnant while taking this medicine or for 12 weeks after stopping this medicine. Women should inform their doctor if they wish to become pregnant or think they might be pregnant. There is a potential for serious side effects to an unborn child. Talk to your health care professional or pharmacist for more information. Do not breast-feed an infant while taking   this medicine. Men should inform their doctors if they wish to father a child. This medicine may lower sperm counts. Talk to your health care professional or pharmacist for more information. What side effects may I notice from receiving this  medicine? Side effects that you should report to your doctor or health care professional as soon as possible: -allergic reactions like skin rash, itching or hives, swelling of the face, lips, or tongue -bone pain -breathing problems -changes in vision -chest pain -feeling faint or lightheaded, falls -fever, chills -pain, swelling, warmth in the leg -pain, tingling, numbness in the hands or feet -signs and symptoms of low blood pressure like dizziness; feeling faint or lightheaded, falls; unusually weak or tired -stomach pain -swelling of the ankles, feet, hands -trouble passing urine or change in the amount of urine -unusually high or low blood pressure -unusually weak or tired Side effects that usually do not require medical attention (report to your doctor or health care professional if they continue or are bothersome): -change in sex drive or performance -changes in breast size in both males and females -changes in emotions or moods -headache -hot flashes -irritation at site where injected -loss of appetite -skin problems like acne, dry skin -vaginal dryness This list may not describe all possible side effects. Call your doctor for medical advice about side effects. You may report side effects to FDA at 1-800-FDA-1088. Where should I keep my medicine? This drug is given in a hospital or clinic and will not be stored at home. NOTE: This sheet is a summary. It may not cover all possible information. If you have questions about this medicine, talk to your doctor, pharmacist, or health care provider.  2015, [employer]. (2013-06-15 11:10:35)  

## 2014-06-06 NOTE — Progress Notes (Signed)
Erin [name]  MR#: [id number]    PCP: Drema Pry, DO GYN: Thomes Cake SU: Rolm Bookbinder OTHER MD: Crista Luria, [place] Pyrt[place], [place] Vernia Buff  CHIEF COMPLAINT: Recurrent breast cancer  CURRENT THERAPY: Goserelin, anastrozole, zolendronate, and radiation   BREAST CANCER HISTORY From the original intake note 04/07/2011:  Ms. Erin [name] had routine screening mammography December of 2009 at the [facility] in [place], [place], showing an increasing asymmetry in her left breast. She was recalled for additional views December 29. She was noted to have heterogeneously dense breasts. It was a 5 cm area of architectural distortion in the lateral aspect of the left breast with no associated calcifications. There was a second lesion medial to this. Ultrasound showed a highly suspicious hypoechoic irregularly marginated mass measuring 3 cm at the 2:30 position 5 cm from the nipple. This was palpable to the mammographer. The second area in question I measured 7 mm and a third lesion was noted measuring 5 mm. Some left axillary lymph nodes were morphologically normal.  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 Bilateral breast MRIs were performed 05/04/2008 and showed, in the left breast, a large lobulated mass measuring up to 4.3 cm, and including both of the apparently separate masses the previously biopsied. In the right breast there were 2 indeterminate lesions. These were evaluated further with breast specific gamma imaging performed January 14 in both lesions were negative. The lesion in the left breast was markedly abnormal.  Given this complex history and with the background of significant breast density, the patient opted for bilateral mastectomies with left sentinel lymph node sampling. This was performed of 06/27/2008 and  showed([id number]) in the right breast, no malignancy. In the left breast there was a 3.2 cm invasive ductal carcinoma, grade 1, focally extending to the anterior margin of the lower outer quadrant. There was extensive angiolymphatic invasion, but both sentinel lymph nodes on the left were negative.  The patient received left-sided postmastectomy radiation including of the left chest wall and left supraclavicular fossa to a total dose of 50.4 Gy plus a 10 Gy scar boost. She had an Oncotype DX recurrence score of 17, further discussed below. She decided to forego reconstruction. She was tested for BRCA1 and 2 and was found to be negative. Given her overall prognosis, she did not receive chemotherapy, but started tamoxifen February of 2010, with good tolerance.  RECURRENT DISEASE: Erin [name] took tamoxifen for 5 years with no evidence of active disease area did in May 2015 we decided to stop the tamoxifen because of problems with endometrial hyperplasia. 6 months later, at the November visit, she was found to have a change in a small area of scaring her left inframammary fold. She was referred to dermatology and biopsy 04/21/2014 showed ([id number]) recurrent ductal adenocarcinoma (positive for gross cystic disease fluid protein, estrogen, and progesterone). She then underwent a staging PET scan in [facility] 05/06/2014. This showed a 1.9 cm sclerotic lesion in the right ischial tuberosity, with a maximum standard uptake value of 4.6. A sclerotic lesion in the left side of the L3 vertebral body measuring 1.6 cm had no hypermetabolic activity.   Her subsequent history is as detailed below.  INTERVAL HISTORY: Erin [name] returns today for follow-up of her recurrent breast cancer. The interval since her last visit here has been busy. She was staged with  a PET scan and bone scan which showed only abnormal uptake in the right ischial tuberosity. This was biopsied 05/24/2014 and showed ([id number]) fragments  of bone and muscle but no evidence of malignancy. I reviewed the procedure with interventional radiology and they went over the images were then needle was placed. It is placed exactly at the area of abnormality. They do not believe repeating a biopsy at this point would be warranted. The patient then proceeded to wide excision of her left inframammary skin recurrence. This was performed 06/01/2014, and showed ([id number]) and invasive ductal carcinoma, grade 1, measuring 1.2 cm. The prognostic panel is pending. Margins were focally less than a millimeter inferiorly. The patient is a ready scheduled to meet with Dr. Stanton Kidney later this month and will have adjuvant radiation to this area under his direction. Since the last visit here as well a patient has met with Dr. Dellis Filbert and had a sonogram of her uterus, which apparently showed minimal thickening of the endometrial stripe. A biopsy was obtained, but I do not have any of those results at present. Erin [name] tells me "everything was fine". Also since the last visit here, she started goserelin. She proceeded to have a menstrual period shortly thereafter. She is scheduled to have a second goserelin dose today together with zolendronate. She has not yet started her anastrozole.   REVIEWOF SYSTEMS: She had no significant pain from her surgery but finds herself more fatigued than usual and is taking some naps, which is unusual for her. She feels as little bit of puffiness in the left axilla. She had 2 bruises in the lower right leg following the bone marrow biopsy. She was also moving some boxes at that time but is not aware of any trauma. This is below the knee, so it is very unlikely to be related to that her earlier procedure. She does have a history of "easy bruising". Emotionally she generally does well, but is very stressed because she has not been able to keep many of her work appointments and probably will need another month before she can get back into some kind  of normal work schedule. A detailed review of systems today was otherwise noncontributory     Past Medical History  Diagnosis Date  . Hypertension   . Allergic rhinitis due to pollen   . Osteopenia   . Hypothyroidism   . Breast cancer 2010  . Wears glasses   Diverticulosis       Past Surgical History  Procedure Laterality Date  . Tonsillectomy    . Right knee arthroscopy  2008  . Inguinal hernia repair      right side as infant  . Wisdom tooth extraction    . Cyst removed      left lower jaw - at age 89 yr  . Dilatation & currettage/hysteroscopy with resectocope N/A 09/16/2013    Procedure: DILATATION & CURETTAGE/HYSTEROSCOPY WITH RESECTOCOPE;  Surgeon: Princess Bruins, MD;  Location: [facility];  Service: Gynecology;  Laterality: N/A;  1 hr.  . Mastectomy  2010    bilateral mastectomies-lt snbx-radiation post  . Colonoscopy    . Breast lumpectomy Left 06/01/2014    Procedure: WIDE LOCAL EXCISION OF RECURRENT LEFT BREAST CANCER;  Surgeon: Rolm Bookbinder, MD;  Location: [facility];  Service: General;  Laterality: Left;    FAMILY HISTORY The patient's mother died in [id number] from complications of lung cancer. The patient has not been in touch with her father her for approximately  30 years. She had no sisters, 1 brother, who is in good health. There is no breast or ovarian cancer in the family to her knowledge.  GYNECOLOGIC HISTORY:  GX P0, menarche at around age 89, the patient continued to have periods despite being on tamoxifen, although more irregularly. 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     ADVANCED DIRECTIVES: not in place  HEALTH MAINTENANCE:      History  Substance Use Topics  . Smoking status: Never Smoker   . Smokeless tobacco: Never Used  . Alcohol Use: Yes     Comment: rare      Colonoscopy: December 2013 Collene Mares)  PAP: Feb 2011  Bone density: 06/2009,  normal  Cholesterol: "good"      Allergies  Allergen Reactions  . Adhesive [Tape] Rash    MEDICATIONS:    Current Outpatient Prescriptions  Medication Sig Dispense Refill  . acyclovir ointment (ZOVIRAX) 5 % Apply 1 application topically every 3 (three) hours. (Patient taking differently: Apply 1 application topically every 3 (three) hours as needed (for outbreaks). ) 5 g 5  . calcium carbonate (OS-CAL) 600 MG TABS tablet Take 600 mg by mouth daily with breakfast.     . Cholecalciferol (VITAMIN D3) 2000 UNITS capsule Take 2,000 Units by mouth daily.      Marland Kitchen levothyroxine (SYNTHROID, LEVOTHROID) 75 MCG tablet Take 1 tablet (75 mcg total) by mouth daily. 90 tablet 1  . loratadine (CLARITIN) 10 MG tablet Take 10 mg by mouth daily as needed for allergies.     Marland Kitchen losartan (COZAAR) 25 MG tablet Take 1 tablet (25 mg total) by mouth daily. 90 tablet 3  . oxyCODONE-acetaminophen (PERCOCET) 10-325 MG per tablet Take 1 tablet by mouth every 6 (six) hours as needed for pain. 20 tablet 0  . Polyethyl Glycol-Propyl Glycol (SYSTANE OP) Apply 1 drop to eye as needed (for allergies).    . sodium chloride (OCEAN) 0.65 % SOLN nasal spray Place 1 spray into both nostrils as needed for congestion.     No current facility-administered medications for this visit.    OBJECTIVE:  Middle-aged white woman who appears younger than stated age    16 Vitals:   06/06/14 0803  BP: 139/79  Pulse: 91  Temp: 98.7 F (37.1 C)  Resp: 19     Body mass index is 25.26 kg/(m^2).     ECOG performance status: 1  Sclerae unicteric, pupils round and equal Oropharynx clear, dentition in good repair No cervical or supraclavicular adenopathy Lungs no rales or rhonchi Heart regular rate and rhythm Abd soft, nontender, positive bowel sounds MSK no focal spinal tenderness, no upper extremity lymphedema Neuro: nonfocal, well oriented, anxious but appropriate affect Breasts: Status post bilateral mastectomies. There is  no evidence of chest wall recurrence on the right and the right axilla is benign. On the left, there are some Steri-Strips in place over the recent incision. There is no evidence of dehiscence. There is no erythema and there is no unusual swelling. The left axilla is benign.   LABS:           Chemistry      Component Value Date/Time   NA 138 05/06/2014 1601   NA 137 02/08/2014 0834   K 4.0 05/06/2014 1601   K 4.1 02/08/2014 0834   CL 105 02/08/2014 0834   CL 105 08/10/2012 0944   CO2 25 05/06/2014 1601  CO2 25 02/08/2014 0834   BUN 14.3 05/06/2014 1601   BUN 14 02/08/2014 0834   CREATININE 0.8 05/06/2014 1601   CREATININE 0.8 02/08/2014 0834      Component Value Date/Time   CALCIUM 8.7 05/06/2014 1601   CALCIUM 8.9 02/08/2014 0834   ALKPHOS 66 05/06/2014 1601   ALKPHOS 59 02/08/2014 0834   AST 16 05/06/2014 1601   AST 17 02/08/2014 0834   ALT 10 05/06/2014 1601   ALT 14 02/08/2014 0834   BILITOT 1.04 05/06/2014 1601   BILITOT 0.9 02/08/2014 0834         Lab Results  Component Value Date   WBC 4.7 05/24/2014   HGB 13.2 06/01/2014   HCT 42.4 05/24/2014   MCV 84.0 05/24/2014   PLT 216 05/24/2014   NEUTROABS 3.9 05/06/2014    STUDIES  Mr Pelvis W Wo Contrast  05/20/2014   CLINICAL DATA:  History of recurrent left breast cancer. Sclerotic and hypermetabolic lesion identified in the right is still tuberosity by PET CT scan.  EXAM: MRI PELVIS WITHOUT AND WITH CONTRAST  TECHNIQUE: Multiplanar multisequence MR imaging of the pelvis was performed both before and after administration of intravenous contrast.  CONTRAST:  14 mL MULTIHANCE GADOBENATE DIMEGLUMINE 529 MG/ML IV SOLN  COMPARISON:  PET CT scan 05/06/2014.  FINDINGS: In the right ischial tuberosity, there is a T1 hypointense lesion measuring 2.2 x 1.6 cm in diameter in the axial plane correlating with finding on PET CT scan. The lesion demonstrates minimally increased T2 signal and postcontrast enhancement and is within  the medullary space. No other focal bony lesion is identified. The right hamstring origin is normal in appearance. All visualized muscles and tendons appear normal. No notable degenerative change is seen about the hips, symphysis pubis or sacroiliac joints. Imaged lower lumbar spine shows mild disc bulging but the central canal appears open. Imaged intrapelvic contents are unremarkable.  IMPRESSION: Focus of abnormal signal in the right ischial tuberosity has an appearance highly suspicious for metastatic disease.   Electronically Signed   By: Inge Rise M.D.   On: 05/20/2014 10:27   Nm Bone Scan Whole Body  05/20/2014   CLINICAL DATA:  Left breast malignancy, asymptomatic ; recent abnormal PET-CT study in the right ischial tuberosity.  EXAM: NUCLEAR MEDICINE WHOLE BODY BONE SCAN  TECHNIQUE: Whole body anterior and posterior images were obtained approximately 3 hours after intravenous injection of radiopharmaceutical.  RADIOPHARMACEUTICALS:  26.9 mCi Technetium-99 MDP  COMPARISON:  PET-CT study of May 06, 2014 semi: Images from a bone density study of October 03, 2011  FINDINGS: There is adequate uptake of the radiopharmaceutical by the liver. Adequate soft tissue clearance and renal activity is demonstrated.  There is an abnormal focus of increased uptake corresponding to the PET-CT findings in the posterior-inferior aspect of the right ischium.  Uptake elsewhere within the skeleton is within the limits of normal.  IMPRESSION: 1. There is a focus of abnormal uptake in the posterior inferior aspect of the right ischium corresponding to the sclerotic lesion on the recent PET-CT study. This this is suspicious for a sclerotic metastatic focus and will merit followup. Plain films of the pelvis are recommended now to establish a baseline for subsequent follow-up. 2. No abnormal uptake is demonstrated elsewhere within the skeleton.   Electronically Signed   By: David  Martinique   On: 05/20/2014 10:35   Ct  Biopsy  05/24/2014   INDICATION: History of breast cancer, now with hypermetabolic lesion within the  right ischial tuberosity worrisome for metastatic disease as confirmed on subsequent pelvic MRI and bone scan. Please perform CT-guided biopsy for tissue diagnostic purposes  EXAM: CT GUIDED BIOPSY OF INDETERMINATE HYPERMETABOLIC LESION WITHIN THE RIGHT ISCHIAL TUBEROSITY  COMPARISON:  PET-CT - 05/06/2014; pelvic MRI - 05/20/2014; bone scan - 05/20/2014  MEDICATIONS: Fentanyl 100 mcg IV; Versed 4 mg IV  ANESTHESIA/SEDATION: Sedation Time  13 minutes  CONTRAST:  None  COMPLICATIONS: None immediate.  PROCEDURE: Informed consent was obtained from the patient following an explanation of the procedure, risks, benefits and alternatives. The patient understands, agrees and consents for the procedure. All questions were addressed. A time out was performed prior to the initiation of the procedure. The patient was positioned prone and non-contrast localization CT was performed of the pelvis demonstrating grossly unchanged appearance of the ill-defined mixed lytic and sclerotic lesion within the right ischial tuberosity measuring approximate 1.8 x 1.4 cm (image 36, series 2). The operative site was prepped and draped in the usual sterile fashion.  Under sterile conditions and local anesthesia, a 22 gauge spinal needle was utilized for procedural planning. Next, an 11 gauge coaxial bone biopsy needle was advanced into the peripheral aspect of the ill-defined mixed lytic and sclerotic lesion. Needle position was confirmed with CT imaging.  Initially, 2 biopsies were obtained with the inner 13 gauge bone biopsy device. Appropriate position was again confirmed with limited CT imaging. Finally, the 11 gauge coaxial bone biopsy device was utilized to obtain a final bone lesion biopsy.  The needle was removed intact. Hemostasis was obtained with compression and a dressing was placed. Postprocedural imaging was negative for evidence of  complication and demonstrated the biopsy track within the cranial aspect of indeterminate mixed lytic and sclerotic lesion (image 5, series 12). The patient tolerated the procedure well without immediate post procedural complication.  IMPRESSION: Technically successful CT guided biopsy of indeterminate mixed lytic and sclerotic hypermetabolic lesion within the right ischial tuberosity.   Electronically Signed   By: Sandi Mariscal M.D.   On: 05/24/2014 11:50     ASSESSMENT: 54 y.o.  BRCA negative [place] woman ,  (1) status post bilateral mastectomies January 2010 for a left-sided T2 N0 (stage IIA) invasive ductal carcinoma, grade 1, strongly estrogen and progesterone receptor positive, HER-2 nonamplified,   (2) Oncotype DX recurrence score of 17, predicting a 10-12% distant recurrence risk over 10 years if her only adjuvant treatment is tamoxifen for 5 years  (3) status post Left postmastectomy radiation including Left supraclavicular fossa (5040 cGy in 28 sessions)  (4) on tamoxifen starting February of 2010, stopped May 2015 because of endometrial hyperplasia  (5) osteopenia-- T score -1.2 on repeated bone density scans 09/24/2011 and 12/12/2013  RECURRENT DISEASE (6) skin biopsy of a lesion under the left breast scar 04/21/2014 shows adenocarcinoma, gross cystic disease fluid protein, estrogen and progesterone receptor positive  (a) s/p wide excision with negative though close margins 06/01/2014, prognostic panel pending  (b) awaiting electron beam radiotherapy  (7) PET scan and bone scan show only one additional area of concern, in the right ischiial tuberosity--  (a) biopsy 05/24/2014 showed no evidence of malignancy  (b) zolendronate started 06/06/2014, to be repeated every 3 months  (8) goserelin monthly started 05/09/2016, consider eventual BSO  (9) anastrozole to start March 2016   Plan:  I spent approximately 50 minutes with Erin [name] today going over her situation. She has  removed the one confirmed area of recurrent disease and though the margins were  close, they were clear. Accordingly the next step as far as local control is concerned is radiation, and she has an appoitnment with Dr Valere Dross 06/16/2014 to accomplish that.  The bone biopsy was negative. I discussed the procedure with radiology and they reviewed their needle placement films. The needle was right at the abnormal area in the bone. There is little point in repeating the biopsy at this point. Instead we are going to repeat the bone MRI and bone scan in 3 months. She will receive zolendronate today, and that frequently uncovers additional areas of concern in patients with bone metastases; accordingly the next bone scan may be more informative. If she did have bone metastatic disease, we would be treating her the same way, so there is no concern that we are missing something in that regard.  She still has not entered menopause, but likely will after today's second goserelin dose. We are going to wait another month at least to start the anastrozole so she does not confuse the menopausal symptoms with anastrozole side effects (there is some overlap). Also it is generally best to wait until radiation is done before starting antiestrogen.  She may prefer to have BSO rather than receive monthly goserelin indefinitely, but that isnot a decision she has to make at this point--likely in the Fall she can reconsider that issue.  Erin [name] was understandably emotional today. Of course all the evaluations and treatments are interfering with her work. If she feels she needs an agent for emotional stability, she knows venlafaxine is available. If she starts having significant hot flashes from the goserelin, we also use venlafaxine to control vasomotor symptoms and I would recommend it in that situation.  She will see me again in one month. She knows to call for any problems that may develop before that visit.  Chauncey Cruel,  MD  06/06/2014

## 2014-06-06 NOTE — Telephone Encounter (Signed)
Reschedule patient from today to Friday per chemo RN

## 2014-06-06 NOTE — Progress Notes (Signed)
Injection given by infusion nurse 

## 2014-06-06 NOTE — Telephone Encounter (Signed)
gave pt avs report and appts for march thru july.

## 2014-06-10 ENCOUNTER — Ambulatory Visit (HOSPITAL_BASED_OUTPATIENT_CLINIC_OR_DEPARTMENT_OTHER): Payer: BC Managed Care – PPO

## 2014-06-10 ENCOUNTER — Encounter: Payer: BC Managed Care – PPO | Admitting: Oncology

## 2014-06-10 DIAGNOSIS — N8500 Endometrial hyperplasia, unspecified: Secondary | ICD-10-CM

## 2014-06-10 DIAGNOSIS — M8580 Other specified disorders of bone density and structure, unspecified site: Secondary | ICD-10-CM

## 2014-06-10 DIAGNOSIS — C50412 Malignant neoplasm of upper-outer quadrant of left female breast: Secondary | ICD-10-CM

## 2014-06-10 DIAGNOSIS — C50912 Malignant neoplasm of unspecified site of left female breast: Secondary | ICD-10-CM

## 2014-06-10 MED ORDER — ZOLEDRONIC ACID 4 MG/100ML IV SOLN
4.0000 mg | Freq: Once | INTRAVENOUS | Status: AC
  Administered 2014-06-10: 4 mg via INTRAVENOUS
  Filled 2014-06-10: qty 100

## 2014-06-10 NOTE — Patient Instructions (Signed)

## 2014-06-15 NOTE — Progress Notes (Signed)
06/01/14 WIDE LOCAL EXCISION OF RECURRENT LEFT BREAST CANCER (Left)  C/o soreness in the surgical region. Has complete range of motion  Zometa 06/10/14 with resulting aching with fever of 100 to 100.2

## 2014-06-16 ENCOUNTER — Ambulatory Visit
Admission: RE | Admit: 2014-06-16 | Discharge: 2014-06-16 | Disposition: A | Discharge status: Home / Self Care | Payer: BC Managed Care – PPO | Source: Ambulatory Visit | Attending: Radiation Oncology | Admitting: Radiation Oncology

## 2014-06-16 ENCOUNTER — Encounter: Payer: Self-pay | Admitting: Radiation Oncology

## 2014-06-16 VITALS — BP 128/83 | HR 89 | Temp 98.0°F | Ht 65.0 in | Wt 157.1 lb

## 2014-06-16 DIAGNOSIS — C50412 Malignant neoplasm of upper-outer quadrant of left female breast: Secondary | ICD-10-CM

## 2014-06-16 DIAGNOSIS — Z510 Encounter for antineoplastic radiation therapy: Secondary | ICD-10-CM | POA: Diagnosis not present

## 2014-06-16 DIAGNOSIS — C50912 Malignant neoplasm of unspecified site of left female breast: Secondary | ICD-10-CM

## 2014-06-16 NOTE — Progress Notes (Signed)
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  I saw her in consultation on 05/11/2014. Her past history is well summarized in Erin [name] notes. She was diagnosed with T2 N0 invasive ductal carcinoma of the left breast in February 2010. She underwent bilateral mastectomies (prophylactic on the right) with a left sentinel lymph node sampling. She had a 3.2 cm invasive ductal carcinoma, grade 1, focally extending to the anterior margin of the lower outer quadrant along with extensive angiolymphatic invasion but both sentinel lymph nodes were negative. She received left-sided post mastectomy radiation therapy and left supraclavicular fossa, 5040 cGy in 28 sessions followed by a 1000 cGy/5 boost to her left mastectomy scar. Her Oncotype DX score was 17. She received adjuvant tamoxifen for 5 years and this was discontinued on May 2015. She states that approximately 2 years ago she noted a skin lesion lateral to her left mastectomy scar which began to grow after discontinuation of her tamoxifen. She was seen by Erin [name] who performed a biopsy of the left chest wall lesion on 04/21/2014, which was consistent with metastatic breast cancer. There was some positivity for estrogen and progesterone receptors. She was seen by Erin [name] for excision of the recurrence followed by possible radiation therapy. She is not yet postmenopausal and she was started on goserelin in anticipation of starting an aromatase inhibitor. Her staging workup included a PET scan at [facility] on 05/06/2014 which showed a 1.9 cm sclerotic lesion in the right ischial tuberosity with increased metabolic activity. Another sclerotic lesion was seen in the left side of L3 measuring 1.6 cm but no hypermetabolic  activity.A bone scan on 05/20/2014 showed a focus of abnormal uptake in the posterior inferior aspect of the right ischium corresponding to the sclerotic lesions seen on the PET scan.  This was felt to be suspicious for a sclerotic metastasis.  CT-guided biopsy of the right ischium on 05/24/2014 was without evidence for carcinoma.  Erin [name] performed a wide local excision on 06/01/2014 with excision of a 1.2 cm invasive ductal carcinoma, grade 1/3.  Invasive carcinoma was focally less than 0.1 cm to the inferior margin.  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  The tumor was ER positive at 95%, PR positive at 66% with a low proliferation marker of 5%.  More recently, she was started on goserelin, and Zometa.  Following Zometa she had flulike symptoms along with a fever.  This lasted 1-2 days, abruptly ended and then recurred last night for a brief period.  She is rather emotional today.  Of note is that we have all of her records from [place].  Physical examination: Alert and oriented, tearful during much of her visit. Filed Vitals:   06/16/14 1021  BP: 128/83  Pulse: 89  Temp: 98 F (36.7 C)   Head and neck examination: Grossly unremarkable.  Nodes: Without palpable cervical, supraclavicular, or axillary lymphadenopathy.  Chest: There is a left anterolateral seasonal biopsy wound measuring 10 cm in greatest diameter which is linear with her previous mastectomy scar.  The wound is healing well.  There is atrophic scarring along the left lateral edge of the wound.  There is telangiectasia along the chest wall/ mastectomy scar as previously described.  No visible or palpable evidence for persistent disease.  Extremities: Without edema.  Impression: Recurrent carcinoma of the  left breast, left chest wall.  It is unclear as to whether not she has metastatic disease to bone.  In any case, she is a candidate for electron beam reirradiation of her left chest wall.  We have photographs of her treatment fields from  [place] and also primary excision photographs from a Dr. [name] to guide our electron beam fields.  We will obviously need to overlap with her previous fields including her electron boost along the mastectomy scar was previously received approximately 6040 cGy in 33 sessions.  I feel that we can deliver a further 4000 -5000 cGy with 6 MEV electrons.  Of note is that she described symptomatical radiation dermatitis and it was unclear as to whether not this was due to tamoxifen that she was on during her radiation.  She will be closely monitored during her course of therapy.  I described the potential acute and late toxicities of radiation therapy including a small chance for a rib fracture and/or soft tissue necrosis which would require plastic surgery reconstruction.  We feel that the benefits of treatment outweigh these risks considering her close margin and the presence of LV I placing her at significant risk for a future chest wall recurrence.  Consent is signed today.  Plan: As above.  30 minutes was spent face-to-face with the patient, primarily counseling the patient and coordinating her care.

## 2014-06-17 ENCOUNTER — Telehealth: Payer: Self-pay | Admitting: *Deleted

## 2014-06-17 NOTE — Telephone Encounter (Signed)
Patient calls.  She had zometa - her first dose on Friday  06/10/14 and had flu-like sx aching all over with temp of 100 to 101 all weekend.  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  By late Sunday she was better, however her teeth still hurt.  Then Wednesday 06/15/14 she experienced chills and fever (100.2) in the middle of the night and again ached all over, was very thirsty and drank about 32 oz of water.  She also took tylenol and advil.  She saw Dr. Valere Dross yesterday 06/16/14 and he advised her to call the "nurse hot line."  Today she is tired, her teeth are better but still not quite normal.  She feels like she is getting over this.  She sees Dr. [name] on 3/14. Let her know that flu-like sx can be a side-effect, but this sounds like more than what patients usually experience.  She did the right thing with tylenol and fluids.  Discussed that when she sees her dentist she needs to make them aware she has had zometa.  When she sees Dr. Marjie Skiff on 3/14, discuss with him her experience and make sure he is aware she has a history of TMJ.  If her sx return, again increase her fluids, can use tylenol and certainly call us.  Will let Dr. [name] know what she experienced and if her has additional instructions for right now, will let her know.  Her call back is [phone]

## 2014-06-20 ENCOUNTER — Ambulatory Visit: Payer: BC Managed Care – PPO

## 2014-07-04 ENCOUNTER — Ambulatory Visit (HOSPITAL_BASED_OUTPATIENT_CLINIC_OR_DEPARTMENT_OTHER): Payer: BC Managed Care – PPO

## 2014-07-04 ENCOUNTER — Other Ambulatory Visit (HOSPITAL_BASED_OUTPATIENT_CLINIC_OR_DEPARTMENT_OTHER): Payer: BC Managed Care – PPO

## 2014-07-04 ENCOUNTER — Ambulatory Visit: Payer: BC Managed Care – PPO

## 2014-07-04 ENCOUNTER — Ambulatory Visit (HOSPITAL_BASED_OUTPATIENT_CLINIC_OR_DEPARTMENT_OTHER): Payer: BC Managed Care – PPO | Admitting: Oncology

## 2014-07-04 ENCOUNTER — Other Ambulatory Visit: Payer: Self-pay | Admitting: Oncology

## 2014-07-04 ENCOUNTER — Ambulatory Visit (HOSPITAL_COMMUNITY)
Admission: RE | Admit: 2014-07-04 | Discharge: 2014-07-04 | Disposition: A | Discharge status: Home / Self Care | Payer: BC Managed Care – PPO | Source: Ambulatory Visit | Attending: Oncology | Admitting: Oncology

## 2014-07-04 ENCOUNTER — Telehealth: Payer: Self-pay | Admitting: Oncology

## 2014-07-04 VITALS — BP 138/79 | HR 86 | Temp 98.5°F | Resp 18 | Ht 65.0 in | Wt 153.6 lb

## 2014-07-04 DIAGNOSIS — C50912 Malignant neoplasm of unspecified site of left female breast: Secondary | ICD-10-CM

## 2014-07-04 DIAGNOSIS — C50412 Malignant neoplasm of upper-outer quadrant of left female breast: Secondary | ICD-10-CM

## 2014-07-04 DIAGNOSIS — Z5111 Encounter for antineoplastic chemotherapy: Secondary | ICD-10-CM

## 2014-07-04 DIAGNOSIS — N8500 Endometrial hyperplasia, unspecified: Secondary | ICD-10-CM

## 2014-07-04 DIAGNOSIS — C50919 Malignant neoplasm of unspecified site of unspecified female breast: Secondary | ICD-10-CM | POA: Insufficient documentation

## 2014-07-04 DIAGNOSIS — R51 Headache: Secondary | ICD-10-CM | POA: Insufficient documentation

## 2014-07-04 DIAGNOSIS — M8580 Other specified disorders of bone density and structure, unspecified site: Secondary | ICD-10-CM

## 2014-07-04 DIAGNOSIS — Z170 Estrogen receptor positive status [ER+]: Secondary | ICD-10-CM

## 2014-07-04 LAB — CBC WITH DIFFERENTIAL/PLATELET
BASO%: 1.2 % (ref 0.0–2.0)
Basophils Absolute: 0.1 10*3/uL (ref 0.0–0.1)
EOS ABS: 0.1 10*3/uL (ref 0.0–0.5)
EOS%: 2.7 % (ref 0.0–7.0)
HCT: 43.7 % (ref 34.8–46.6)
HGB: 14.1 g/dL (ref 11.6–15.9)
LYMPH%: 30.3 % (ref 14.0–49.7)
MCH: 27.2 pg (ref 25.1–34.0)
MCHC: 32.2 g/dL (ref 31.5–36.0)
MCV: 84.3 fL (ref 79.5–101.0)
MONO#: 0.5 10*3/uL (ref 0.1–0.9)
MONO%: 9.6 % (ref 0.0–14.0)
NEUT#: 2.7 10*3/uL (ref 1.5–6.5)
NEUT%: 56.2 % (ref 38.4–76.8)
PLATELETS: 234 10*3/uL (ref 145–400)
RBC: 5.18 10*6/uL (ref 3.70–5.45)
RDW: 14.2 % (ref 11.2–14.5)
WBC: 4.8 10*3/uL (ref 3.9–10.3)
lymph#: 1.5 10*3/uL (ref 0.9–3.3)

## 2014-07-04 LAB — COMPREHENSIVE METABOLIC PANEL (CC13)
ALT: 21 U/L (ref 0–55)
AST: 19 U/L (ref 5–34)
Albumin: 3.7 g/dL (ref 3.5–5.0)
Alkaline Phosphatase: 81 U/L (ref 40–150)
Anion Gap: 10 mEq/L (ref 3–11)
BUN: 10.5 mg/dL (ref 7.0–26.0)
CHLORIDE: 102 meq/L (ref 98–109)
CO2: 27 meq/L (ref 22–29)
Calcium: 8.8 mg/dL (ref 8.4–10.4)
Creatinine: 0.8 mg/dL (ref 0.6–1.1)
EGFR: 82 mL/min/{1.73_m2} — ABNORMAL LOW (ref >90)
GLUCOSE: 91 mg/dL (ref 70–140)
POTASSIUM: 3.6 meq/L (ref 3.5–5.1)
Sodium: 139 mEq/L (ref 136–145)
TOTAL PROTEIN: 6.7 g/dL (ref 6.4–8.3)
Total Bilirubin: 0.88 mg/dL (ref 0.20–1.20)

## 2014-07-04 MED ORDER — GOSERELIN ACETATE 3.6 MG ~~LOC~~ IMPL
3.6000 mg | DRUG_IMPLANT | Freq: Once | SUBCUTANEOUS | Status: AC
  Administered 2014-07-04: 3.6 mg via SUBCUTANEOUS
  Filled 2014-07-04: qty 3.6

## 2014-07-04 MED ORDER — GADOBENATE DIMEGLUMINE 529 MG/ML IV SOLN
15.0000 mL | Freq: Once | INTRAVENOUS | Status: AC | PRN
  Administered 2014-07-04: 14 mL via INTRAVENOUS

## 2014-07-04 NOTE — Telephone Encounter (Signed)
appts made and avs pritned for pt  Erin [name] °

## 2014-07-04 NOTE — Progress Notes (Signed)
Erin [name]  MR#: [id number]    PCP: Drema Pry, DO GYN: Thomes Cake SU: Rolm Bookbinder OTHER MD: Crista Luria, [address], [place] Vernia Buff  CHIEF COMPLAINT: Recurrent breast cancer  CURRENT THERAPY: Goserelin, anastrozole, zolendronate, and radiation   BREAST CANCER HISTORY From the original intake note 04/07/2011:  Erin [name] had routine screening mammography December of 2009 at the [facility] in [place], [place], showing an increasing asymmetry in her left breast. She was recalled for additional views December 29. She was noted to have heterogeneously dense breasts. It was a 5 cm area of architectural distortion in the lateral aspect of the left breast with no associated calcifications. There was a second lesion medial to this. Ultrasound showed a highly suspicious hypoechoic irregularly marginated mass measuring 3 cm at the 2:30 position 5 cm from the nipple. This was palpable to the mammographer. The second area in question I measured 7 mm and a third lesion was noted measuring 5 mm. Some left axillary lymph nodes were morphologically normal.  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 Bilateral breast MRIs were performed 05/04/2008 and showed, in the left breast, a large lobulated mass measuring up to 4.3 cm, and including both of the apparently separate masses the previously biopsied. In the right breast there were 2 indeterminate lesions. These were evaluated further with breast specific gamma imaging performed January 14 in both lesions were negative. The lesion in the left breast was markedly abnormal.  Given this complex history and with the background of significant breast density, the patient opted for bilateral mastectomies with left sentinel lymph node sampling. This was performed of 06/27/2008 and  showed([id number]. [id number]) in the right breast, no malignancy. In the left breast there was a 3.2 cm invasive ductal carcinoma, grade 1, focally extending to the anterior margin of the lower outer quadrant. There was extensive angiolymphatic invasion, but both sentinel lymph nodes on the left were negative.  The patient received left-sided postmastectomy radiation including of the left chest wall and left supraclavicular fossa to a total dose of 50.4 Gy plus a 10 Gy scar boost. She had an Oncotype DX recurrence score of 17, further discussed below. She decided to forego reconstruction. She was tested for BRCA1 and 2 and was found to be negative. Given her overall prognosis, she did not receive chemotherapy, but started tamoxifen February of 2010, with good tolerance.  RECURRENT DISEASE: Erin [name] took tamoxifen for 5 years with no evidence of active disease area did in May 2015 we decided to stop the tamoxifen because of problems with endometrial hyperplasia. 6 months later, at the November visit, she was found to have a change in a small area of scaring her left inframammary fold. She was referred to dermatology and biopsy 04/21/2014 showed ([id number]) recurrent ductal adenocarcinoma (positive for gross cystic disease fluid protein, estrogen, and progesterone). She then underwent a staging PET scan in [facility] 05/06/2014. This showed a 1.9 cm sclerotic lesion in the right ischial tuberosity, with a maximum standard uptake value of 4.6. A sclerotic lesion in the left side of the L3 vertebral body measuring 1.6 cm had no hypermetabolic activity.   Her subsequent history is as detailed below.  INTERVAL HISTORY: Erin [name] returns today for follow-up of her recurrent breast cancer. Since her last visit here she met with Dr. Valere Dross and is scheduled for  simulation this week 4 electron beam of her chest wall recurrence. Dr. Stanton Kidney was able to obtain the prior radiation field reports. Aside from that, returned  had a horrible time with zolendronate. She had aches and pains and fever that would come and go to days at a time in which still have not completely resolved. She has had joint pains as well, more recently involving the knees and the third digit of the right hand. She does not want to receive any more zolendronate and so we will be switching to [place].   REVIEWOF SYSTEMS: Quite aside from all the side effects related to zolendronate, she has had a headache. She does have some sinus symptoms and sometimes the headache is nuchal suggesting stress. However the headaches are pretty much every day and they can be present in the morning. She does not have nausea or vomiting problems, dizziness, or gait imbalance. She had some visual changes in one I for a day or 2 and that also has resolved. She is having many more hot flashes than before. She is having some aches and pains as is noted including back pain. She feels fatigued. A detailed review of systems today was otherwise stable.    Past Medical History  Diagnosis Date  . Hypertension   . Allergic rhinitis due to pollen   . Osteopenia   . Hypothyroidism   . Breast cancer 2010  . Wears glasses   Diverticulosis       Past Surgical History  Procedure Laterality Date  . Tonsillectomy    . Right knee arthroscopy  2008  . Inguinal hernia repair      right side as infant  . Wisdom tooth extraction    . Cyst removed      left lower jaw - at age 80 yr  . Dilatation & currettage/hysteroscopy with resectocope N/A 09/16/2013    Procedure: DILATATION & CURETTAGE/HYSTEROSCOPY WITH RESECTOCOPE;  Surgeon: Princess Bruins, MD;  Location: [facility];  Service: Gynecology;  Laterality: N/A;  1 hr.  . Mastectomy  2010    bilateral mastectomies-lt snbx-radiation post  . Colonoscopy    . Breast lumpectomy Left 06/01/2014    Procedure: WIDE LOCAL EXCISION OF RECURRENT LEFT BREAST CANCER;  Surgeon: Rolm Bookbinder, MD;  Location: [facility];  Service:  General;  Laterality: Left;    FAMILY HISTORY The patient's mother died in [id number] from complications of lung cancer. The patient has not been in touch with her father her for approximately 30 years. She had no sisters, 1 brother, who is in good health. There is no breast or ovarian cancer in the family to her knowledge.  GYNECOLOGIC HISTORY:  GX P0, menarche at around age 41, the patient continued to have periods despite being on tamoxifen, although more irregularly. 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     ADVANCED DIRECTIVES: not in place  HEALTH MAINTENANCE:      History  Substance Use Topics  . Smoking status: Never Smoker   . Smokeless tobacco: Never Used  . Alcohol Use: Yes     Comment: rare      Colonoscopy: December 2013 Collene Mares)  PAP: Feb 2011  Bone density: 06/2009, normal  Cholesterol: "good"      Allergies  Allergen Reactions  . Pollen Extract   . Adhesive [Tape] Rash    MEDICATIONS:    Current Outpatient Prescriptions  Medication Sig Dispense Refill  .  acyclovir ointment (ZOVIRAX) 5 % Apply 1 application topically every 3 (three) hours. (Patient taking differently: Apply 1 application topically every 3 (three) hours as needed (for outbreaks). ) 5 g 5  . calcium carbonate (OS-CAL) 600 MG TABS tablet Take 600 mg by mouth daily with breakfast.     . Cholecalciferol (VITAMIN D3) 2000 UNITS capsule Take 2,000 Units by mouth daily.      Marland Kitchen goserelin (ZOLADEX) 10.8 MG injection Inject 10.8 mg into the skin once.    Marland Kitchen levothyroxine (SYNTHROID, LEVOTHROID) 75 MCG tablet Take 1 tablet (75 mcg total) by mouth daily. 90 tablet 1  . loratadine (CLARITIN) 10 MG tablet Take 10 mg by mouth daily as needed for allergies.     Marland Kitchen losartan (COZAAR) 25 MG tablet Take 1 tablet (25 mg total) by mouth daily. 90 tablet 3  . Polyethyl Glycol-Propyl Glycol (SYSTANE OP) Apply 1 drop to eye as needed (for  allergies).    . sodium chloride (OCEAN) 0.65 % SOLN nasal spray Place 1 spray into both nostrils as needed for congestion.     No current facility-administered medications for this visit.    OBJECTIVE:  Middle-aged white woman    Filed Vitals:   07/04/14 0827  BP: 138/79  Pulse: 86  Temp: 98.5 F (36.9 C)  Resp: 18     Body mass index is 25.56 kg/(m^2).     ECOG performance status: 1  Sclerae unicteric, EOMs intact Oropharynx clear and moist No cervical or supraclavicular adenopathy Lungs no rales or rhonchi Heart regular rate and rhythm Abd soft, nontender, positive bowel sounds MSK no focal spinal tenderness, no upper extremity lymphedema, no obvious swelling of the third digit of the right hand Neuro: nonfocal, well oriented, anxious but appropriate affect Breasts: Status post bilateral mastectomies. There is no evidence of chest wall recurrence on the right and the right axilla is benign. On the left, there is no evidence of residual tumor. The left axilla is benign   LABS:           Chemistry      Component Value Date/Time   NA 139 07/04/2014 0810   NA 137 02/08/2014 0834   K 3.6 07/04/2014 0810   K 4.1 02/08/2014 0834   CL 105 02/08/2014 0834   CL 105 08/10/2012 0944   CO2 27 07/04/2014 0810   CO2 25 02/08/2014 0834   BUN 10.5 07/04/2014 0810   BUN 14 02/08/2014 0834   CREATININE 0.8 07/04/2014 0810   CREATININE 0.8 02/08/2014 0834      Component Value Date/Time   CALCIUM 8.8 07/04/2014 0810   CALCIUM 8.9 02/08/2014 0834   ALKPHOS 81 07/04/2014 0810   ALKPHOS 59 02/08/2014 0834   AST 19 07/04/2014 0810   AST 17 02/08/2014 0834   ALT 21 07/04/2014 0810   ALT 14 02/08/2014 0834   BILITOT 0.88 07/04/2014 0810   BILITOT 0.9 02/08/2014 0834         Lab Results  Component Value Date   WBC 4.8 07/04/2014   HGB 14.1 07/04/2014   HCT 43.7 07/04/2014   MCV 84.3 07/04/2014   PLT 234 07/04/2014   NEUTROABS 2.7 07/04/2014    STUDIES  No results  found.   ASSESSMENT: 54 y.o.  BRCA negative [place] woman ,  (1) status post bilateral mastectomies January 2010 for a left-sided T2 N0 (stage IIA) invasive ductal carcinoma, grade 1, strongly estrogen and progesterone receptor positive, HER-2 nonamplified,   (2) Oncotype DX recurrence  score of 17, predicting a 10-12% distant recurrence risk over 10 years if her only adjuvant treatment is tamoxifen for 5 years  (3) status post Left postmastectomy radiation including Left supraclavicular fossa (5040 cGy in 28 sessions)  (4) on tamoxifen starting February of 2010, stopped May 2015 because of endometrial hyperplasia  (5) osteopenia-- T score -1.2 on repeated bone density scans 09/24/2011 and 12/12/2013  RECURRENT DISEASE (6) skin biopsy of a lesion under the left breast scar 04/21/2014 shows adenocarcinoma, gross cystic disease fluid protein, estrogen and progesterone receptor positive  (a) s/p wide excision with negative though close margins 06/01/2014, prognostic panel pending  (b) awaiting electron beam radiotherapy  (7) PET scan and bone scan show only one additional area of concern, in the right ischiial tuberosity--  (a) biopsy 05/24/2014 showed no evidence of malignancy  (b) zolendronate started 06/06/2014, to be repeated every 3 months  (8) goserelin monthly started 05/09/2016, consider eventual BSO  (9) anastrozole to start April 2016   Plan:  Erin [name] had a hard time with the zolendronate and does not want to receive it again. We are going to switch to [place] starting probably in June, because she has a work trip mid to late May and she does not want to compromise her ability to function because of symptoms. We are continuing the goserelin as before.  We discussed starting the anastrozole at this point, but she would prefer to do one thing at a time so we will finish the radiation first. That means she will start anastrozole sometime in April.  We discussed venlafaxine.  This would help her hot flashes and might help stabilize her mood. However she assures me she is functioning very well out "except when I am with the doctors". She would prefer not to start venlafaxine at this point. She knows it is available to her.  I am a little bit concerned about her persistent headaches. I think the best thing to do there is to just grab an MRI of the brain and make sure there is nothing going on. I expected to be benign. She will call us directly to get results so she does not have to wait several days to here.  Otherwise she will see me again April 11 with her next goserelin dose. She knows to call for any other problems that may develop before her next visit here. Chauncey Cruel, MD  07/04/2014

## 2014-07-05 ENCOUNTER — Ambulatory Visit: Payer: BC Managed Care – PPO | Admitting: Radiation Oncology

## 2014-07-05 ENCOUNTER — Other Ambulatory Visit: Payer: Self-pay | Admitting: Radiation Oncology

## 2014-07-06 ENCOUNTER — Ambulatory Visit
Admission: RE | Admit: 2014-07-06 | Discharge: 2014-07-06 | Disposition: A | Discharge status: Home / Self Care | Payer: BC Managed Care – PPO | Source: Ambulatory Visit | Attending: Radiation Oncology | Admitting: Radiation Oncology

## 2014-07-06 DIAGNOSIS — Z510 Encounter for antineoplastic radiation therapy: Secondary | ICD-10-CM | POA: Diagnosis not present

## 2014-07-06 DIAGNOSIS — C50912 Malignant neoplasm of unspecified site of left female breast: Secondary | ICD-10-CM

## 2014-07-06 NOTE — Progress Notes (Signed)
Simulation/treatment planning note: The patient was taken to the simulator where she had a construction of a Vac lock immobilization device on a custom breast board.  Based on her previous radiation therapy photographs, I outlined a radiation therapy field on her skin.  I also marked out her most recent excisional biopsy wound and previous mastectomy scar for reference.  She was then taken to the [facility] where she was set up LAO with the couch angled, feet towards the gantry to treat her en face.  I'm requesting that one custom block be constructed.  A special port plan is requested.  On the first of her treatment she'll have construction of 0.8 cm custom bolus to maximize the dose to the skin surface.  I prescribing 4000 cGy 20 sessions utilizing 6 MEV electrons.  Provided that she has reasonable skin tolerance, I will give her a boost of 1000 cGy in 5 sessions to a reduced field.

## 2014-07-11 ENCOUNTER — Encounter: Payer: Self-pay | Admitting: Radiation Oncology

## 2014-07-11 DIAGNOSIS — Z510 Encounter for antineoplastic radiation therapy: Secondary | ICD-10-CM | POA: Diagnosis not present

## 2014-07-11 NOTE — Progress Notes (Signed)
Electron beam simulation/treatment planning: The patient completed her electron beam treatment planning for treatment to her left chest wall.  An isodose plan a special port plan was reviewed and excepted.  I am prescribing 4000 cGy in 20 sessions utilizing 6 MEV electrons.  One custom block is constructed to conform the field.  0.8 cm custom bolus to be constructed in apply to her skin on the first day of her treatment.

## 2014-07-12 ENCOUNTER — Ambulatory Visit
Admission: RE | Admit: 2014-07-12 | Discharge: 2014-07-12 | Disposition: A | Discharge status: Home / Self Care | Payer: BC Managed Care – PPO | Source: Ambulatory Visit | Attending: Radiation Oncology | Admitting: Radiation Oncology

## 2014-07-12 DIAGNOSIS — Z510 Encounter for antineoplastic radiation therapy: Secondary | ICD-10-CM | POA: Diagnosis not present

## 2014-07-12 DIAGNOSIS — C50912 Malignant neoplasm of unspecified site of left female breast: Secondary | ICD-10-CM

## 2014-07-12 MED ORDER — RADIAPLEXRX EX GEL
Freq: Once | CUTANEOUS | Status: AC
  Administered 2014-07-12: 09:00:00 via TOPICAL

## 2014-07-12 NOTE — Progress Notes (Signed)
Patient education completed with patient. Gave her "Radiaiton and You" booklet with all pertinent information marked and discussed, re: fatigue, skin irritation/care, nutrition, pain. Gave patient Radiaplex with instructions for proper use.  Per Faith, RT on L2 patient's axilla is not in treatment field; therefore Alra deodorant not given to patient. Teach back method used; pt verbalized understanding.

## 2014-07-13 ENCOUNTER — Ambulatory Visit
Admission: RE | Admit: 2014-07-13 | Discharge: 2014-07-13 | Disposition: A | Discharge status: Home / Self Care | Payer: BC Managed Care – PPO | Source: Ambulatory Visit | Attending: Radiation Oncology | Admitting: Radiation Oncology

## 2014-07-13 DIAGNOSIS — Z510 Encounter for antineoplastic radiation therapy: Secondary | ICD-10-CM | POA: Diagnosis not present

## 2014-07-14 ENCOUNTER — Ambulatory Visit
Admission: RE | Admit: 2014-07-14 | Discharge: 2014-07-14 | Disposition: A | Discharge status: Home / Self Care | Payer: BC Managed Care – PPO | Source: Ambulatory Visit | Attending: Radiation Oncology | Admitting: Radiation Oncology

## 2014-07-14 DIAGNOSIS — Z510 Encounter for antineoplastic radiation therapy: Secondary | ICD-10-CM | POA: Diagnosis not present

## 2014-07-15 ENCOUNTER — Ambulatory Visit
Admission: RE | Admit: 2014-07-15 | Discharge: 2014-07-15 | Disposition: A | Discharge status: Home / Self Care | Payer: BC Managed Care – PPO | Source: Ambulatory Visit | Attending: Radiation Oncology | Admitting: Radiation Oncology

## 2014-07-15 DIAGNOSIS — Z510 Encounter for antineoplastic radiation therapy: Secondary | ICD-10-CM | POA: Diagnosis not present

## 2014-07-18 ENCOUNTER — Ambulatory Visit: Payer: BC Managed Care – PPO

## 2014-07-18 ENCOUNTER — Ambulatory Visit
Admission: RE | Admit: 2014-07-18 | Discharge: 2014-07-18 | Disposition: A | Discharge status: Home / Self Care | Payer: BC Managed Care – PPO | Source: Ambulatory Visit | Attending: Radiation Oncology | Admitting: Radiation Oncology

## 2014-07-18 ENCOUNTER — Encounter: Payer: Self-pay | Admitting: Radiation Oncology

## 2014-07-18 VITALS — BP 141/79 | HR 88 | Temp 98.1°F | Ht 63.0 in | Wt 155.3 lb

## 2014-07-18 DIAGNOSIS — C50412 Malignant neoplasm of upper-outer quadrant of left female breast: Secondary | ICD-10-CM

## 2014-07-18 DIAGNOSIS — Z510 Encounter for antineoplastic radiation therapy: Secondary | ICD-10-CM | POA: Diagnosis not present

## 2014-07-18 NOTE — Progress Notes (Signed)
   Weekly Management Note:  Outpatient    ICD-9-CM ICD-10-CM   1. Breast cancer of upper-outer quadrant of left female breast 174.4 C50.412     Current Dose:  10 Gy  Projected Dose: 40 Gy then boost  Narrative:  The patient presents for routine under treatment assessment.  CBCT/MVCT images/Port film x-rays were reviewed.  The chart was checked. No complaints  Physical Findings:  height is 5\' 3"  (1.6 m) and weight is 155 lb 4.8 oz (70.444 kg). Her temperature is 98.1 F (36.7 C). Her blood pressure is 141/79 and her pulse is 88.   Wt Readings from Last 3 Encounters:  07/04/14 153 lb 9.6 oz (69.673 kg)  07/04/14 153 lb (69.4 kg)  06/16/14 157 lb 1.6 oz (71.26 kg)   No skin changes so far over left chest wall.  Impression:  The patient is tolerating radiotherapy.  Plan:  Continue radiotherapy as planned.   ________________________________   Eppie Gibson, M.D.

## 2014-07-18 NOTE — Progress Notes (Signed)
Ms. Mckellar has received 5 fractions to her left breast.  Note reddened area at the lateral incisional region.  Skin soft and intact.  Using Radiaplex.  She denies fatigue today.

## 2014-07-19 ENCOUNTER — Ambulatory Visit
Admission: RE | Admit: 2014-07-19 | Discharge: 2014-07-19 | Disposition: A | Discharge status: Home / Self Care | Payer: BC Managed Care – PPO | Source: Ambulatory Visit | Attending: Radiation Oncology | Admitting: Radiation Oncology

## 2014-07-19 DIAGNOSIS — Z510 Encounter for antineoplastic radiation therapy: Secondary | ICD-10-CM | POA: Diagnosis not present

## 2014-07-20 ENCOUNTER — Ambulatory Visit
Admission: RE | Admit: 2014-07-20 | Discharge: 2014-07-20 | Disposition: A | Discharge status: Home / Self Care | Payer: BC Managed Care – PPO | Source: Ambulatory Visit | Attending: Radiation Oncology | Admitting: Radiation Oncology

## 2014-07-20 DIAGNOSIS — Z510 Encounter for antineoplastic radiation therapy: Secondary | ICD-10-CM | POA: Diagnosis not present

## 2014-07-21 ENCOUNTER — Ambulatory Visit
Admission: RE | Admit: 2014-07-21 | Discharge: 2014-07-21 | Disposition: A | Discharge status: Home / Self Care | Payer: BC Managed Care – PPO | Source: Ambulatory Visit | Attending: Radiation Oncology | Admitting: Radiation Oncology

## 2014-07-21 DIAGNOSIS — Z510 Encounter for antineoplastic radiation therapy: Secondary | ICD-10-CM | POA: Diagnosis not present

## 2014-07-22 ENCOUNTER — Ambulatory Visit
Admission: RE | Admit: 2014-07-22 | Discharge: 2014-07-22 | Disposition: A | Discharge status: Home / Self Care | Payer: BC Managed Care – PPO | Source: Ambulatory Visit | Attending: Radiation Oncology | Admitting: Radiation Oncology

## 2014-07-22 DIAGNOSIS — Z510 Encounter for antineoplastic radiation therapy: Secondary | ICD-10-CM | POA: Diagnosis not present

## 2014-07-25 ENCOUNTER — Encounter: Payer: Self-pay | Admitting: Radiation Oncology

## 2014-07-25 ENCOUNTER — Ambulatory Visit
Admission: RE | Admit: 2014-07-25 | Discharge: 2014-07-25 | Disposition: A | Discharge status: Home / Self Care | Payer: BC Managed Care – PPO | Source: Ambulatory Visit | Attending: Radiation Oncology | Admitting: Radiation Oncology

## 2014-07-25 VITALS — BP 140/87 | HR 77 | Temp 97.6°F | Resp 12 | Ht 63.0 in | Wt 155.7 lb

## 2014-07-25 DIAGNOSIS — Z510 Encounter for antineoplastic radiation therapy: Secondary | ICD-10-CM | POA: Diagnosis not present

## 2014-07-25 DIAGNOSIS — C50912 Malignant neoplasm of unspecified site of left female breast: Secondary | ICD-10-CM

## 2014-07-25 NOTE — Progress Notes (Signed)
Weekly Management Note:  Site: Left chest wall Current Dose:  2000  cGy Projected Dose: 4000  CGy followed by left chest wall boost  Narrative: The patient is seen today for routine under treatment assessment. CBCT/MVCT images/port films were reviewed. The chart was reviewed.   She is without new complaints today outside the effects of her previous somatic infusion.  She uses Radioplex gel along her left chest wall.  Physical Examination:  Filed Vitals:   07/25/14 0829  BP: 140/87  Pulse: 77  Temp: 97.6 F (36.4 C)  Resp: 12  .  Weight: 155 lb 11.2 oz (70.625 kg).  There is faint erythema along the left chest wall with no significant radiation dermatitis.  Impression: Tolerating radiation therapy well.  Plan: Continue radiation therapy as planned.

## 2014-07-25 NOTE — Progress Notes (Signed)
Erin [name] has completed 10 fractions to her left breast.  She denies pain.  She reports slight fatigue.  She reports swelling in her left foot from a past Zometa infusion.  She has an appointment to see Dr. [name] next Monday.  The skin on her left chest is red.  She is using radiaplex gel.  BP 140/87 mmHg  Pulse 77  Temp(Src) 97.6 F (36.4 C) (Oral)  Resp 12  Ht 5\' 3"  (1.6 m)  Wt 155 lb 11.2 oz (70.625 kg)  BMI 27.59 kg/m2

## 2014-07-26 ENCOUNTER — Ambulatory Visit
Admission: RE | Admit: 2014-07-26 | Discharge: 2014-07-26 | Disposition: A | Discharge status: Home / Self Care | Payer: BC Managed Care – PPO | Source: Ambulatory Visit | Attending: Radiation Oncology | Admitting: Radiation Oncology

## 2014-07-26 DIAGNOSIS — Z510 Encounter for antineoplastic radiation therapy: Secondary | ICD-10-CM | POA: Diagnosis not present

## 2014-07-27 ENCOUNTER — Ambulatory Visit
Admission: RE | Admit: 2014-07-27 | Discharge: 2014-07-27 | Disposition: A | Discharge status: Home / Self Care | Payer: BC Managed Care – PPO | Source: Ambulatory Visit | Attending: Radiation Oncology | Admitting: Radiation Oncology

## 2014-07-27 DIAGNOSIS — Z510 Encounter for antineoplastic radiation therapy: Secondary | ICD-10-CM | POA: Diagnosis not present

## 2014-07-28 ENCOUNTER — Ambulatory Visit
Admission: RE | Admit: 2014-07-28 | Discharge: 2014-07-28 | Disposition: A | Discharge status: Home / Self Care | Payer: BC Managed Care – PPO | Source: Ambulatory Visit | Attending: Radiation Oncology | Admitting: Radiation Oncology

## 2014-07-28 DIAGNOSIS — Z510 Encounter for antineoplastic radiation therapy: Secondary | ICD-10-CM | POA: Diagnosis not present

## 2014-07-29 ENCOUNTER — Ambulatory Visit
Admission: RE | Admit: 2014-07-29 | Discharge: 2014-07-29 | Disposition: A | Discharge status: Home / Self Care | Payer: BC Managed Care – PPO | Source: Ambulatory Visit | Attending: Radiation Oncology | Admitting: Radiation Oncology

## 2014-07-29 DIAGNOSIS — Z510 Encounter for antineoplastic radiation therapy: Secondary | ICD-10-CM | POA: Diagnosis not present

## 2014-08-01 ENCOUNTER — Other Ambulatory Visit (HOSPITAL_BASED_OUTPATIENT_CLINIC_OR_DEPARTMENT_OTHER): Payer: BC Managed Care – PPO

## 2014-08-01 ENCOUNTER — Ambulatory Visit
Admission: RE | Admit: 2014-08-01 | Discharge: 2014-08-01 | Disposition: A | Discharge status: Home / Self Care | Payer: BC Managed Care – PPO | Source: Ambulatory Visit | Attending: Radiation Oncology | Admitting: Radiation Oncology

## 2014-08-01 ENCOUNTER — Encounter: Payer: Self-pay | Admitting: Radiation Oncology

## 2014-08-01 ENCOUNTER — Telehealth: Payer: Self-pay | Admitting: Oncology

## 2014-08-01 ENCOUNTER — Ambulatory Visit (HOSPITAL_BASED_OUTPATIENT_CLINIC_OR_DEPARTMENT_OTHER): Payer: BC Managed Care – PPO

## 2014-08-01 ENCOUNTER — Ambulatory Visit (HOSPITAL_BASED_OUTPATIENT_CLINIC_OR_DEPARTMENT_OTHER): Payer: BC Managed Care – PPO | Admitting: Oncology

## 2014-08-01 ENCOUNTER — Ambulatory Visit: Payer: BC Managed Care – PPO

## 2014-08-01 VITALS — BP 145/75 | HR 86 | Temp 98.1°F | Resp 18 | Ht 63.0 in | Wt 155.0 lb

## 2014-08-01 VITALS — BP 139/84 | HR 71 | Temp 97.9°F | Ht 63.0 in | Wt 154.9 lb

## 2014-08-01 DIAGNOSIS — N8500 Endometrial hyperplasia, unspecified: Secondary | ICD-10-CM | POA: Diagnosis not present

## 2014-08-01 DIAGNOSIS — M8580 Other specified disorders of bone density and structure, unspecified site: Secondary | ICD-10-CM

## 2014-08-01 DIAGNOSIS — Z5111 Encounter for antineoplastic chemotherapy: Secondary | ICD-10-CM

## 2014-08-01 DIAGNOSIS — C50412 Malignant neoplasm of upper-outer quadrant of left female breast: Secondary | ICD-10-CM

## 2014-08-01 DIAGNOSIS — Z510 Encounter for antineoplastic radiation therapy: Secondary | ICD-10-CM | POA: Diagnosis not present

## 2014-08-01 DIAGNOSIS — C50912 Malignant neoplasm of unspecified site of left female breast: Secondary | ICD-10-CM

## 2014-08-01 DIAGNOSIS — C50919 Malignant neoplasm of unspecified site of unspecified female breast: Secondary | ICD-10-CM

## 2014-08-01 LAB — COMPREHENSIVE METABOLIC PANEL (CC13)
ALBUMIN: 3.8 g/dL (ref 3.5–5.0)
ALK PHOS: 72 U/L (ref 40–150)
ALT: 20 U/L (ref 0–55)
ANION GAP: 8 meq/L (ref 3–11)
AST: 22 U/L (ref 5–34)
BUN: 13 mg/dL (ref 7.0–26.0)
CO2: 28 meq/L (ref 22–29)
Calcium: 9.3 mg/dL (ref 8.4–10.4)
Chloride: 104 mEq/L (ref 98–109)
Creatinine: 0.7 mg/dL (ref 0.6–1.1)
EGFR: >90 mL/min/{1.73_m2} (ref >90)
Glucose: 92 mg/dl (ref 70–140)
Potassium: 4.5 mEq/L (ref 3.5–5.1)
Sodium: 141 mEq/L (ref 136–145)
Total Bilirubin: 1.14 mg/dL (ref 0.20–1.20)
Total Protein: 6.7 g/dL (ref 6.4–8.3)

## 2014-08-01 LAB — CBC WITH DIFFERENTIAL/PLATELET
BASO%: 0.8 % (ref 0.0–2.0)
BASOS ABS: 0 10*3/uL (ref 0.0–0.1)
EOS%: 2.3 % (ref 0.0–7.0)
Eosinophils Absolute: 0.1 10*3/uL (ref 0.0–0.5)
HCT: 43.2 % (ref 34.8–46.6)
HEMOGLOBIN: 14.1 g/dL (ref 11.6–15.9)
LYMPH%: 22.7 % (ref 14.0–49.7)
MCH: 27.2 pg (ref 25.1–34.0)
MCHC: 32.7 g/dL (ref 31.5–36.0)
MCV: 83.1 fL (ref 79.5–101.0)
MONO#: 0.5 10*3/uL (ref 0.1–0.9)
MONO%: 9.9 % (ref 0.0–14.0)
NEUT%: 64.3 % (ref 38.4–76.8)
NEUTROS ABS: 3.1 10*3/uL (ref 1.5–6.5)
Platelets: 217 10*3/uL (ref 145–400)
RBC: 5.2 10*6/uL (ref 3.70–5.45)
RDW: 14.5 % (ref 11.2–14.5)
WBC: 4.9 10*3/uL (ref 3.9–10.3)
lymph#: 1.1 10*3/uL (ref 0.9–3.3)

## 2014-08-01 MED ORDER — ACYCLOVIR 5 % EX OINT: 1.0000 "application " | TOPICAL_OINTMENT | CUTANEOUS | Status: DC

## 2014-08-01 MED ORDER — GOSERELIN ACETATE 3.6 MG ~~LOC~~ IMPL
3.6000 mg | DRUG_IMPLANT | Freq: Once | SUBCUTANEOUS | Status: AC
  Administered 2014-08-01: 3.6 mg via SUBCUTANEOUS
  Filled 2014-08-01: qty 3.6

## 2014-08-01 NOTE — Telephone Encounter (Signed)
per pof to sch pt appt-gave pt copy of sch °

## 2014-08-01 NOTE — Progress Notes (Addendum)
Complex simulation/electron beam simulation: She underwent electron beam simulation on the [facility].  She was set up to LAO.  I outlined her boost field.  One custom block is constructed to conform the field.  She'll continue with her custom bolus.  A special port plan is requested.  I'm prescribing 1000 cGy in 5 sessions utilizing 6 MEV electrons. I just reviewed her special port plan, and I chose the 95% isodose curve to cover the planning target volume.

## 2014-08-01 NOTE — Progress Notes (Signed)
Erin [name]  MR#: [id number]    PCP: Drema Pry, DO GYN: Thomes Cake SU: Rolm Bookbinder OTHER MD: Crista Luria, [place], [place] Vernia Buff  CHIEF COMPLAINT: Recurrent breast cancer  CURRENT THERAPY: Goserelin, and radiation; to start anastrozole in May and denosumab in June   BREAST [place] From the original intake note 04/07/2011:  Erin [name] had routine screening mammography December of 2009 at the [facility] in [place], [place], showing an increasing asymmetry in her left breast. She was recalled for additional views December 29. She was noted to have heterogeneously dense breasts. It was a 5 cm area of architectural distortion in the lateral aspect of the left breast with no associated calcifications. There was a second lesion medial to this. Ultrasound showed a highly suspicious hypoechoic irregularly marginated mass measuring 3 cm at the 2:30 position 5 cm from the nipple. This was palpable to the mammographer. The second area in question I measured 7 mm and a third lesion was noted measuring 5 mm. Some left axillary lymph nodes were morphologically normal.  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 Bilateral breast MRIs were performed 05/04/2008 and showed, in the left breast, a large lobulated mass measuring up to 4.3 cm, and including both of the apparently separate masses the previously biopsied. In the right breast there were 2 indeterminate lesions. These were evaluated further with breast specific gamma imaging performed January 14 in both lesions were negative. The lesion in the left breast was markedly abnormal.  Given this complex history and with the background of significant breast density, the patient opted for bilateral mastectomies with left sentinel lymph node sampling. This was  performed of 06/27/2008 and showed([id number]. [id number]) in the right breast, no malignancy. In the left breast there was a 3.2 cm invasive ductal carcinoma, grade 1, focally extending to the anterior margin of the lower outer quadrant. There was extensive angiolymphatic invasion, but both sentinel lymph nodes on the left were negative.  The patient received left-sided postmastectomy radiation including of the left chest wall and left supraclavicular fossa to a total dose of 50.4 Gy plus a 10 Gy scar boost. She had an Oncotype DX recurrence score of 17, further discussed below. She decided to forego reconstruction. She was tested for BRCA1 and 2 and was found to be negative. Given her overall prognosis, she did not receive chemotherapy, but started tamoxifen February of 2010, with good tolerance.  RECURRENT DISEASE: Erin [name] took tamoxifen for 5 years with no evidence of active disease area did in May 2015 we decided to stop the tamoxifen because of problems with endometrial hyperplasia. 6 months later, at the November visit, she was found to have a change in a small area of scaring her left inframammary fold. She was referred to dermatology and biopsy 04/21/2014 showed ([id number]) recurrent ductal adenocarcinoma (positive for gross cystic disease fluid protein, estrogen, and progesterone). She then underwent a staging PET scan in [facility] 05/06/2014. This showed a 1.9 cm sclerotic lesion in the right ischial tuberosity, with a maximum standard uptake value of 4.6. A sclerotic lesion in the left side of the L3 vertebral body measuring 1.6 cm had no hypermetabolic activity.   Her subsequent history is as detailed below.  INTERVAL HISTORY: Erin [name] returns today for follow-up of her recurrent breast cancer. She continues on radiation, which she is  tolerating well. She has minimal erythema and no fatigue. She continues to work full-time although she cannot travel of course while receiving radiation. She is  also receiving goserelin. She is having hot flashes and night sweats, with some insomnia. She has had no periods after that. She had initially (after the first goserelin dose.) Vaginal dryness is not an issue. There has been no weight gain. She continues to exercise regularly, although not as regularly as before she says.  REVIEWOF SYSTEMS: She has discomfort in the third digit of the right hand and now also in the left heel. She wonders if this is related to zolendronate. I really think what she is experiencing is a form of arthritis, but why it would be unmasked by zolendronate I am not sure. This is very focal. It is not all over her joints. She also has some TMJ symptoms. I think this may be due to stress and jaw clenching at night although she does have a night guard which she uses. She is discussing her situation with a pathologist friend and the pathologist friend as wondered if we should send the tissue obtained from the bone biopsy for a second opinion in [place] or elsewhere. Most importantly Erin [name] finds it "hard to get the whole picture". She does not understand if she is stage IV or not. Are we treating her as if she were stage IV or as if she had local recurrence? Doesn't make a difference in terms of her survival if we treat her one way or the other; specifically should she be receiving radiation to the bony spot even though the biopsy was negative?    Past Medical History  Diagnosis Date  . Hypertension   . Allergic rhinitis due to pollen   . Osteopenia   . Hypothyroidism   . Breast cancer 2010  . Wears glasses   Diverticulosis       Past Surgical History  Procedure Laterality Date  . Tonsillectomy    . Right knee arthroscopy  2008  . Inguinal hernia repair      right side as infant  . Wisdom tooth extraction    . Cyst removed      left lower jaw - at age 30 yr  . Dilatation & currettage/hysteroscopy with resectocope N/A 09/16/2013    Procedure: DILATATION &  CURETTAGE/HYSTEROSCOPY WITH RESECTOCOPE;  Surgeon: Princess Bruins, MD;  Location: [facility];  Service: Gynecology;  Laterality: N/A;  1 hr.  . Mastectomy  2010    bilateral mastectomies-lt snbx-radiation post  . Colonoscopy    . Breast lumpectomy Left 06/01/2014    Procedure: WIDE LOCAL EXCISION OF RECURRENT LEFT BREAST CANCER;  Surgeon: Rolm Bookbinder, MD;  Location: [facility];  Service: General;  Laterality: Left;    FAMILY HISTORY The patient's mother died in [id number] from complications of lung cancer. The patient has not been in touch with her father her for approximately 30 years. She had no sisters, 1 brother, who is in good health. There is no breast or ovarian cancer in the family to her knowledge.  GYNECOLOGIC HISTORY:  GX P0, menarche at around age 31, the patient continued to have periods despite being on tamoxifen, although more irregularly. 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     ADVANCED DIRECTIVES: not in place  HEALTH MAINTENANCE:      History  Substance Use Topics  . Smoking status: Never Smoker   .  Smokeless tobacco: Never Used  . Alcohol Use: Yes     Comment: rare      Colonoscopy: December 2013 Collene Mares)  PAP: Feb 2011  Bone density: 06/2009, normal  Cholesterol: "good"      Allergies  Allergen Reactions  . Pollen Extract   . Adhesive [Tape] Rash    MEDICATIONS:    Current Outpatient Prescriptions  Medication Sig Dispense Refill  . acyclovir ointment (ZOVIRAX) 5 % Apply 1 application topically every 3 (three) hours. (Patient not taking: Reported on 07/25/2014) 5 g 5  . calcium carbonate (OS-CAL) 600 MG TABS tablet Take 600 mg by mouth daily with breakfast.     . Cholecalciferol (VITAMIN D3) 2000 UNITS capsule Take 2,000 Units by mouth daily.      Marland Kitchen goserelin (ZOLADEX) 10.8 MG injection Inject 10.8 mg into the skin once.    . hyaluronate sodium (RADIAPLEXRX) GEL  Apply 1 application topically 2 (two) times daily.    Marland Kitchen levothyroxine (SYNTHROID, LEVOTHROID) 75 MCG tablet Take 1 tablet (75 mcg total) by mouth daily. 90 tablet 1  . loratadine (CLARITIN) 10 MG tablet Take 10 mg by mouth daily as needed for allergies.     Marland Kitchen losartan (COZAAR) 25 MG tablet Take 1 tablet (25 mg total) by mouth daily. 90 tablet 3  . Polyethyl Glycol-Propyl Glycol (SYSTANE OP) Apply 1 drop to eye as needed (for allergies).    . sodium chloride (OCEAN) 0.65 % SOLN nasal spray Place 1 spray into both nostrils as needed for congestion.     No current facility-administered medications for this visit.    OBJECTIVE:  Middle-aged white woman who appears younger than stated age    71 Vitals:   08/01/14 0814  BP: 145/75  Pulse: 86  Temp: 98.1 F (36.7 C)  Resp: 18     Body mass index is 27.46 kg/(m^2).     ECOG performance status: 1  Sclerae unicteric, pupils round and equal Oropharynx clear, dentition in excellent repair No cervical or supraclavicular adenopathy Lungs no rales or rhonchi Heart regular rate and rhythm Abd soft, nontender, positive bowel sounds MSK no focal spinal tenderness, no upper extremity lymphedema, the third digit of the right hand is entirely normal today Neuro: nonfocal, well oriented, tearful affect Breasts: Status post bilateral mastectomies. The right chest wall is unremarkable. There is minimal erythema over the electron beam port on the left. There is no palpable or visible mass. The left axilla is benign.  LABS:           Chemistry      Component Value Date/Time   NA 139 07/04/2014 0810   NA 137 02/08/2014 0834   K 3.6 07/04/2014 0810   K 4.1 02/08/2014 0834   CL 105 02/08/2014 0834   CL 105 08/10/2012 0944   CO2 27 07/04/2014 0810   CO2 25 02/08/2014 0834   BUN 10.5 07/04/2014 0810   BUN 14 02/08/2014 0834   CREATININE 0.8 07/04/2014 0810   CREATININE 0.8 02/08/2014 0834      Component Value Date/Time   CALCIUM 8.8  07/04/2014 0810   CALCIUM 8.9 02/08/2014 0834   ALKPHOS 81 07/04/2014 0810   ALKPHOS 59 02/08/2014 0834   AST 19 07/04/2014 0810   AST 17 02/08/2014 0834   ALT 21 07/04/2014 0810   ALT 14 02/08/2014 0834   BILITOT 0.88 07/04/2014 0810   BILITOT 0.9 02/08/2014 0834         Lab Results  Component Value Date  WBC 4.8 07/04/2014   HGB 14.1 07/04/2014   HCT 43.7 07/04/2014   MCV 84.3 07/04/2014   PLT 234 07/04/2014   NEUTROABS 2.7 07/04/2014    STUDIES  Mr Jeri Cos Contrast  07/04/2014   CLINICAL DATA:  Stage IV breast cancer.  Headache.  EXAM: MRI HEAD WITH CONTRAST  TECHNIQUE: Multiplanar, multiecho pulse sequences of the brain and surrounding structures were obtained with intravenous contrast.  COMPARISON:  None.  CONTRAST:  8mL MULTIHANCE GADOBENATE DIMEGLUMINE 529 MG/ML IV SOLN  FINDINGS: Ventricle size is normal. Cerebral volume is normal. Pituitary normal in size.  Negative for acute infarct. Mild chronic microvascular ischemic change in the white matter.  Negative for intracranial hemorrhage.  No enhancing lesions are identified postcontrast administration. No mass or edema. Calvarium is intact.  IMPRESSION: Negative for metastatic disease.  No acute abnormality.   Electronically Signed   By: Franchot Gallo M.D.   On: 07/04/2014 16:03     ASSESSMENT: 54 y.o.  BRCA negative [place] woman ,  (1) status post bilateral mastectomies January 2010 for a left-sided T2 N0 (stage IIA) invasive ductal carcinoma, grade 1, strongly estrogen and progesterone receptor positive, HER-2 nonamplified,   (2) Oncotype DX recurrence score of 17, predicting a 10-12% distant recurrence risk over 10 years if her only adjuvant treatment is tamoxifen for 5 years  (3) status post Left postmastectomy radiation including Left supraclavicular fossa (5040 cGy in 28 sessions)  (4) on tamoxifen starting February of 2010, stopped May 2015 because of endometrial hyperplasia  (5) osteopenia-- T score -1.2  on repeated bone density scans 09/24/2011 and 12/12/2013  RECURRENT DISEASE (6) skin biopsy of a lesion under the left breast scar 04/21/2014 shows adenocarcinoma, gross cystic disease fluid protein, estrogen and progesterone receptor positive  (a) s/p wide excision with negative though close margins 06/01/2014, prognostic panel pending  (b) awaiting electron beam radiotherapy to be completed 08/19/2014  (7) PET scan and bone scan show only one additional area of concern, in the right ischiial tuberosity--  (a) biopsy 05/24/2014 showed no evidence of malignancy  (b) zolendronate started 06/06/2014, poorly tolerated  (c) denosumab/ Xgeva to be started June 2016  (8) goserelin monthly started 05/09/2016, consider eventual BSO  (9) anastrozole to start May 2016   Plan:  I spent approximately one hour today with Erin [name] going over multiple issues. The most difficult thing to understand is that finding stage IV breast cancer earlier does not lead to longer survival. We did discuss that at length today.  She has been discussing her situation with the pathologist who wonders if we should send immunostains on the cells on the bone biopsy to see if there are estrogen receptor positive. However they really were no cancer cells 2 immunostain; it was all muscle, fibrosis, and bone. Certainly we could repeat the biopsy but I think the better plan will be to repeat the biopsy after we do a bone scan in September after she has been on denosumab for several months, which is going to "exposed most quotes other bony sites of disease if there are any.  She wonders if she should be receiving radiation to the 1 bony spot, but again we do not know if that is cancer or whether it is the only spot at this point. We really will not know until she has been on denosumab for some months.  She is doing well as far as menopausal symptoms is concerned that she does not want any medications to help with  hot flashes or  emotional lability.  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 So long as no extractions are planned, we are fine.  At this point the plan is to continue goserelin every month, add anastrozole when she sees me in May, add denosumab which she sees me in June, and then repeat a PET scan in September. If we see additional bony spots we would biopsy one of them at that point.  Erin [name] has a good understanding of this plan. She agrees with it. She will call with any problems that may develop before her next visit here. Chauncey Cruel, MD  08/01/2014

## 2014-08-01 NOTE — Addendum Note (Signed)
Encounter addended by: Arloa Koh, MD on: 08/01/2014  6:22 PM<BR>     Documentation filed: Notes Section

## 2014-08-01 NOTE — Progress Notes (Signed)
Weekly Management Note:  Site: Left chest wall Current Dose:  3000  cGy Projected Dose: 4000  cGy followed by 1000 cGy in 5 sessions boost  Narrative: The patient is seen today for routine under treatment assessment. CBCT/MVCT images/port films were reviewed. The chart was reviewed.   She is without complaints today.  She uses Radioplex gel.  Physical Examination:  Filed Vitals:   08/01/14 1115  BP: 139/84  Pulse: 71  Temp: 97.9 F (36.6 C)  .  Weight: 154 lb 14.4 oz (70.262 kg).  There is mild to moderate erythema within her electron beam boost along the left chest wall.  No areas of desquamation.  Impression: Tolerating radiation therapy well.  Plan: Continue radiation therapy as planned.

## 2014-08-01 NOTE — Progress Notes (Signed)
Erin [name] has received 15 fractions to her left chest wall.  She reports mild soreness in this area as a level 2/10.  Note erythema.  skin soft and intact.

## 2014-08-01 NOTE — Telephone Encounter (Signed)
per pof to sch pt appt-gave pt copy of sch-[facility] inf-adv Louise to adv [facility] order was not in-adv pt [facility] will call after order in to sch

## 2014-08-02 ENCOUNTER — Ambulatory Visit: Payer: BC Managed Care – PPO

## 2014-08-03 ENCOUNTER — Ambulatory Visit
Admission: RE | Admit: 2014-08-03 | Discharge: 2014-08-03 | Disposition: A | Discharge status: Home / Self Care | Payer: BC Managed Care – PPO | Source: Ambulatory Visit | Attending: Radiation Oncology | Admitting: Radiation Oncology

## 2014-08-03 DIAGNOSIS — Z510 Encounter for antineoplastic radiation therapy: Secondary | ICD-10-CM | POA: Diagnosis not present

## 2014-08-04 ENCOUNTER — Ambulatory Visit
Admission: RE | Admit: 2014-08-04 | Discharge: 2014-08-04 | Disposition: A | Discharge status: Home / Self Care | Payer: BC Managed Care – PPO | Source: Ambulatory Visit | Attending: Radiation Oncology | Admitting: Radiation Oncology

## 2014-08-04 DIAGNOSIS — Z510 Encounter for antineoplastic radiation therapy: Secondary | ICD-10-CM | POA: Diagnosis not present

## 2014-08-05 ENCOUNTER — Ambulatory Visit
Admission: RE | Admit: 2014-08-05 | Discharge: 2014-08-05 | Disposition: A | Discharge status: Home / Self Care | Payer: BC Managed Care – PPO | Source: Ambulatory Visit | Attending: Radiation Oncology | Admitting: Radiation Oncology

## 2014-08-05 DIAGNOSIS — Z510 Encounter for antineoplastic radiation therapy: Secondary | ICD-10-CM | POA: Diagnosis not present

## 2014-08-08 ENCOUNTER — Ambulatory Visit
Admission: RE | Admit: 2014-08-08 | Discharge: 2014-08-08 | Disposition: A | Discharge status: Home / Self Care | Payer: BC Managed Care – PPO | Source: Ambulatory Visit | Attending: Radiation Oncology | Admitting: Radiation Oncology

## 2014-08-08 ENCOUNTER — Ambulatory Visit
Admission: RE | Admit: 2014-08-08 | Payer: BC Managed Care – PPO | Source: Ambulatory Visit | Admitting: Radiation Oncology

## 2014-08-08 VITALS — BP 128/75 | HR 92 | Temp 98.4°F | Wt 154.9 lb

## 2014-08-08 DIAGNOSIS — Z510 Encounter for antineoplastic radiation therapy: Secondary | ICD-10-CM | POA: Diagnosis not present

## 2014-08-08 DIAGNOSIS — C50912 Malignant neoplasm of unspecified site of left female breast: Secondary | ICD-10-CM

## 2014-08-08 NOTE — Progress Notes (Addendum)
Weekly assessment of radiatio to left chest wall.Completed 19 of 20 treatments then start boost on Wednesday.Has some soreness.Mild redness without peeling of skin.Denies pain and fatigue.BP 128/75 mmHg  Pulse 92  Temp(Src) 98.4 F (36.9 C)  Wt 154 lb 14.4 oz (70.262 kg)

## 2014-08-08 NOTE — Progress Notes (Signed)
Weekly Management Note:  Site: Left chest wall Current Dose:  3800  cGy Projected Dose: 4000  cGy followed by 1 week boost  Narrative: The patient is seen today for routine under treatment assessment. CBCT/MVCT images/port films were reviewed. The chart was reviewed.   She is without complaints today except for mild soreness.  Physical Examination:  Filed Vitals:   08/08/14 1449  BP: 128/75  Pulse: 92  Temp: 98.4 F (36.9 C)  .  Weight: 154 lb 14.4 oz (70.262 kg).  There is mild to moderate erythema along the left chest wall but no areas of desquamation.  Impression: Tolerating radiation therapy well.  Plan: Continue radiation therapy as planned.

## 2014-08-09 ENCOUNTER — Ambulatory Visit
Admission: RE | Admit: 2014-08-09 | Discharge: 2014-08-09 | Disposition: A | Discharge status: Home / Self Care | Payer: BC Managed Care – PPO | Source: Ambulatory Visit | Attending: Radiation Oncology | Admitting: Radiation Oncology

## 2014-08-09 ENCOUNTER — Ambulatory Visit: Payer: BC Managed Care – PPO

## 2014-08-09 DIAGNOSIS — C50912 Malignant neoplasm of unspecified site of left female breast: Secondary | ICD-10-CM | POA: Insufficient documentation

## 2014-08-10 ENCOUNTER — Ambulatory Visit
Admission: RE | Admit: 2014-08-10 | Discharge: 2014-08-10 | Disposition: A | Discharge status: Home / Self Care | Payer: BC Managed Care – PPO | Source: Ambulatory Visit | Attending: Radiation Oncology | Admitting: Radiation Oncology

## 2014-08-10 ENCOUNTER — Other Ambulatory Visit (INDEPENDENT_AMBULATORY_CARE_PROVIDER_SITE_OTHER): Payer: BC Managed Care – PPO

## 2014-08-10 ENCOUNTER — Ambulatory Visit: Payer: BC Managed Care – PPO

## 2014-08-10 ENCOUNTER — Telehealth: Payer: Self-pay | Admitting: *Deleted

## 2014-08-10 DIAGNOSIS — I10 Essential (primary) hypertension: Secondary | ICD-10-CM | POA: Diagnosis not present

## 2014-08-10 DIAGNOSIS — E039 Hypothyroidism, unspecified: Secondary | ICD-10-CM

## 2014-08-10 DIAGNOSIS — C50912 Malignant neoplasm of unspecified site of left female breast: Secondary | ICD-10-CM | POA: Diagnosis not present

## 2014-08-10 LAB — BASIC METABOLIC PANEL
BUN: 13 mg/dL (ref 6–23)
CHLORIDE: 101 meq/L (ref 96–112)
CO2: 30 mEq/L (ref 19–32)
Calcium: 9.3 mg/dL (ref 8.4–10.5)
Creatinine, Ser: 0.76 mg/dL (ref 0.40–1.20)
GFR: 84.7 mL/min (ref >60.00)
Glucose, Bld: 73 mg/dL (ref 70–99)
POTASSIUM: 4.3 meq/L (ref 3.5–5.1)
SODIUM: 136 meq/L (ref 135–145)

## 2014-08-10 LAB — TSH: TSH: 1.04 u[IU]/mL (ref 0.35–4.50)

## 2014-08-10 NOTE — Telephone Encounter (Signed)
Dr. [name] called and spoke to Dr. Jetty Duhamel regarding dental work.

## 2014-08-10 NOTE — Telephone Encounter (Signed)
Received voice message from Dr. Aleda Grana, patient's dentist, who needs to speak with Dr. [name]. Patient needs to have crowns replaced and the dental treatment is going to be approximately four weeks long. Her return number is [phone]. This message was printed and given to MD.

## 2014-08-11 ENCOUNTER — Ambulatory Visit
Admission: RE | Admit: 2014-08-11 | Discharge: 2014-08-11 | Disposition: A | Discharge status: Home / Self Care | Payer: BC Managed Care – PPO | Source: Ambulatory Visit | Attending: Radiation Oncology | Admitting: Radiation Oncology

## 2014-08-11 DIAGNOSIS — C50912 Malignant neoplasm of unspecified site of left female breast: Secondary | ICD-10-CM | POA: Diagnosis not present

## 2014-08-12 ENCOUNTER — Ambulatory Visit
Admission: RE | Admit: 2014-08-12 | Discharge: 2014-08-12 | Disposition: A | Discharge status: Home / Self Care | Payer: BC Managed Care – PPO | Source: Ambulatory Visit | Attending: Radiation Oncology | Admitting: Radiation Oncology

## 2014-08-12 DIAGNOSIS — C50912 Malignant neoplasm of unspecified site of left female breast: Secondary | ICD-10-CM | POA: Diagnosis not present

## 2014-08-15 ENCOUNTER — Ambulatory Visit
Admission: RE | Admit: 2014-08-15 | Discharge: 2014-08-15 | Disposition: A | Discharge status: Home / Self Care | Payer: BC Managed Care – PPO | Source: Ambulatory Visit | Attending: Radiation Oncology | Admitting: Radiation Oncology

## 2014-08-15 ENCOUNTER — Encounter: Payer: Self-pay | Admitting: Radiation Oncology

## 2014-08-15 ENCOUNTER — Ambulatory Visit: Payer: BC Managed Care – PPO

## 2014-08-15 VITALS — BP 134/90 | HR 82 | Temp 98.0°F | Resp 12 | Wt 155.8 lb

## 2014-08-15 DIAGNOSIS — C50912 Malignant neoplasm of unspecified site of left female breast: Secondary | ICD-10-CM | POA: Diagnosis not present

## 2014-08-15 DIAGNOSIS — C50412 Malignant neoplasm of upper-outer quadrant of left female breast: Secondary | ICD-10-CM | POA: Diagnosis not present

## 2014-08-15 MED ORDER — RADIAPLEXRX EX GEL
Freq: Once | CUTANEOUS | Status: AC
  Administered 2014-08-15: 16:00:00 via TOPICAL

## 2014-08-15 NOTE — Progress Notes (Signed)
Weekly Management Note:  Site: Left chest wall, reduced field Current Dose:  4800  cGy Projected Dose: 5000  cGy  Narrative: The patient is seen today for routine under treatment assessment. CBCT/MVCT images/port films were reviewed. The chart was reviewed.   She is doing well but she does have mild left chest wall discomfort within her treatment field.  Her pain is 2/10.  She uses Radioplex gel.  Physical Examination:  Filed Vitals:   08/15/14 0818  BP: 134/90  Pulse: 82  Temp: 98 F (36.7 C)  Resp: 12  .  Weight: 155 lb 12.8 oz (70.67 kg).  There is marked erythema along her left chest wall without areas of desquamation.  Impression: Tolerating radiation therapy well.  She will finish her radiation therapy tomorrow.  Plan: Continue radiation therapy as planned.  One-month follow-up after completion of radiation therapy.

## 2014-08-15 NOTE — Progress Notes (Signed)
She rates her pain as a 2 on a scale of 0-10. intermittent and dull over left breast-scar. Pt left breast- positive for erythema.  Pt denies edema. Pt continues to apply Radiaplex as directed. BP 134/90 mmHg  Pulse 82  Temp(Src) 98 F (36.7 C) (Oral)  Resp 12  Wt 155 lb 12.8 oz (70.67 kg)  SpO2 98%

## 2014-08-15 NOTE — Addendum Note (Signed)
Encounter addended by: Jenene Slicker, RN on: 08/15/2014  4:11 PM<BR>     Documentation filed: [employer], Inpatient MAR, Orders

## 2014-08-16 ENCOUNTER — Encounter: Payer: Self-pay | Admitting: Radiation Oncology

## 2014-08-16 ENCOUNTER — Ambulatory Visit
Admission: RE | Admit: 2014-08-16 | Discharge: 2014-08-16 | Disposition: A | Discharge status: Home / Self Care | Payer: BC Managed Care – PPO | Source: Ambulatory Visit | Attending: Radiation Oncology | Admitting: Radiation Oncology

## 2014-08-16 DIAGNOSIS — C50912 Malignant neoplasm of unspecified site of left female breast: Secondary | ICD-10-CM | POA: Diagnosis not present

## 2014-08-16 NOTE — Progress Notes (Signed)
[facility] End of Treatment Note  Name:Erin [name]  Date: 08/16/2014 ATF:[id number] DOB:13-Sep-1960   Status:outpatient    CC: Drema Pry, DO  Dr. Gunnar Bulla [name]  REFERRING PHYSICIAN:   Dr. Gunnar Bulla [name]   DIAGNOSIS:  Recurrent invasive ductal carcinoma of the left breast, chest wall  INDICATION FOR TREATMENT: "Curative"   TREATMENT DATES: 07/12/2014 through 08/16/2014                          SITE/DOSE:  Left chest wall 4000 cGy in 20 sessions, left chest wall boost 1000 cGy in 5 sessions                          BEAMS/ENERGY:  6 MEV electrons with  0.8 cm  custom bolus to maximize the dose to the skin surface, field reduction after 4000 cGy              NARRATIVE:    The patient tolerated treatment well with marked erythema of her skin along with patchy dry desquamation but no areas of moist desquamation by completion of therapy.                        PLAN: Routine followup in one month. Patient instructed to call if questions or worsening complaints in interim.

## 2014-08-16 NOTE — Progress Notes (Signed)
Special treatment procedure note: The patient underwent radiation therapy to a previously irradiated site placing her at increased risk for treatment-related toxicity.  Careful review of her previous outside dosimetry from [facility] was performed.  This required extra time and constitutes a special treatment procedure.

## 2014-08-17 ENCOUNTER — Ambulatory Visit: Payer: BC Managed Care – PPO

## 2014-08-19 ENCOUNTER — Ambulatory Visit: Payer: BC Managed Care – PPO | Admitting: Internal Medicine

## 2014-08-29 ENCOUNTER — Telehealth: Payer: Self-pay | Admitting: Oncology

## 2014-08-29 ENCOUNTER — Ambulatory Visit (HOSPITAL_BASED_OUTPATIENT_CLINIC_OR_DEPARTMENT_OTHER): Payer: BC Managed Care – PPO

## 2014-08-29 ENCOUNTER — Ambulatory Visit (HOSPITAL_BASED_OUTPATIENT_CLINIC_OR_DEPARTMENT_OTHER): Payer: BC Managed Care – PPO | Admitting: Oncology

## 2014-08-29 ENCOUNTER — Ambulatory Visit: Payer: BC Managed Care – PPO

## 2014-08-29 ENCOUNTER — Other Ambulatory Visit (HOSPITAL_BASED_OUTPATIENT_CLINIC_OR_DEPARTMENT_OTHER): Payer: BC Managed Care – PPO

## 2014-08-29 VITALS — BP 152/80 | HR 81 | Temp 98.1°F | Resp 18 | Ht 63.0 in | Wt 156.6 lb

## 2014-08-29 DIAGNOSIS — C50412 Malignant neoplasm of upper-outer quadrant of left female breast: Secondary | ICD-10-CM

## 2014-08-29 DIAGNOSIS — C50919 Malignant neoplasm of unspecified site of unspecified female breast: Secondary | ICD-10-CM

## 2014-08-29 DIAGNOSIS — M8580 Other specified disorders of bone density and structure, unspecified site: Secondary | ICD-10-CM

## 2014-08-29 DIAGNOSIS — Z170 Estrogen receptor positive status [ER+]: Secondary | ICD-10-CM

## 2014-08-29 DIAGNOSIS — N8500 Endometrial hyperplasia, unspecified: Secondary | ICD-10-CM | POA: Diagnosis not present

## 2014-08-29 DIAGNOSIS — C50912 Malignant neoplasm of unspecified site of left female breast: Secondary | ICD-10-CM

## 2014-08-29 DIAGNOSIS — Z5111 Encounter for antineoplastic chemotherapy: Secondary | ICD-10-CM | POA: Diagnosis not present

## 2014-08-29 LAB — COMPREHENSIVE METABOLIC PANEL (CC13)
ALT: 17 U/L (ref 0–55)
ANION GAP: 9 meq/L (ref 3–11)
AST: 16 U/L (ref 5–34)
Albumin: 3.8 g/dL (ref 3.5–5.0)
Alkaline Phosphatase: 85 U/L (ref 40–150)
BUN: 18.2 mg/dL (ref 7.0–26.0)
CALCIUM: 9.3 mg/dL (ref 8.4–10.4)
CHLORIDE: 104 meq/L (ref 98–109)
CO2: 26 mEq/L (ref 22–29)
Creatinine: 0.7 mg/dL (ref 0.6–1.1)
EGFR: >90 mL/min/{1.73_m2} (ref >90)
Glucose: 90 mg/dl (ref 70–140)
Potassium: 4 mEq/L (ref 3.5–5.1)
Sodium: 139 mEq/L (ref 136–145)
Total Bilirubin: 0.75 mg/dL (ref 0.20–1.20)
Total Protein: 7 g/dL (ref 6.4–8.3)

## 2014-08-29 LAB — CBC WITH DIFFERENTIAL/PLATELET
BASO%: 1.1 % (ref 0.0–2.0)
Basophils Absolute: 0.1 10*3/uL (ref 0.0–0.1)
EOS%: 2 % (ref 0.0–7.0)
Eosinophils Absolute: 0.1 10*3/uL (ref 0.0–0.5)
HCT: 44.7 % (ref 34.8–46.6)
HEMOGLOBIN: 14.8 g/dL (ref 11.6–15.9)
LYMPH#: 1.3 10*3/uL (ref 0.9–3.3)
LYMPH%: 21.9 % (ref 14.0–49.7)
MCH: 27.4 pg (ref 25.1–34.0)
MCHC: 33.2 g/dL (ref 31.5–36.0)
MCV: 82.5 fL (ref 79.5–101.0)
MONO#: 0.5 10*3/uL (ref 0.1–0.9)
MONO%: 8.4 % (ref 0.0–14.0)
NEUT#: 4 10*3/uL (ref 1.5–6.5)
NEUT%: 66.6 % (ref 38.4–76.8)
Platelets: 243 10*3/uL (ref 145–400)
RBC: 5.42 10*6/uL (ref 3.70–5.45)
RDW: 13.5 % (ref 11.2–14.5)
WBC: 6 10*3/uL (ref 3.9–10.3)

## 2014-08-29 LAB — FOLLICLE STIMULATING HORMONE: FSH: 9 m[IU]/mL

## 2014-08-29 MED ORDER — ANASTROZOLE 1 MG PO TABS: 1.0000 mg | ORAL_TABLET | Freq: Every day | ORAL | Status: DC

## 2014-08-29 MED ORDER — GOSERELIN ACETATE 3.6 MG ~~LOC~~ IMPL
3.6000 mg | DRUG_IMPLANT | Freq: Once | SUBCUTANEOUS | Status: AC
  Administered 2014-08-29: 3.6 mg via SUBCUTANEOUS
  Filled 2014-08-29: qty 3.6

## 2014-08-29 NOTE — Progress Notes (Signed)
Erin [name]  MR#: [id number]    PCP: Drema Pry, DO GYN: Thomes Cake SU: Rolm Bookbinder OTHER MD: Crista Luria, Ulice Dash Pyrt[place], [place] Vernia Buff  CHIEF COMPLAINT: Recurrent breast cancer  CURRENT THERAPY: Goserelin, anastrozole; denosumab  To startin June   BREAST CANCER HISTORY From the original intake note 04/07/2011:  Erin [name] had routine screening mammography December of 2009 at the [facility] in [place], [place], showing an increasing asymmetry in her left breast. She was recalled for additional views December 29. She was noted to have heterogeneously dense breasts. It was a 5 cm area of architectural distortion in the lateral aspect of the left breast with no associated calcifications. There was a second lesion medial to this. Ultrasound showed a highly suspicious hypoechoic irregularly marginated mass measuring 3 cm at the 2:30 position 5 cm from the nipple. This was palpable to the mammographer. The second area in question I measured 7 mm and a third lesion was noted measuring 5 mm. Some left axillary lymph nodes were morphologically normal.  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 Bilateral breast MRIs were performed 05/04/2008 and showed, in the left breast, a large lobulated mass measuring up to 4.3 cm, and including both of the apparently separate masses the previously biopsied. In the right breast there were 2 indeterminate lesions. These were evaluated further with breast specific gamma imaging performed January 14 in both lesions were negative. The lesion in the left breast was markedly abnormal.  Given this complex history and with the background of significant breast density, the patient opted for bilateral mastectomies with left sentinel lymph node sampling. This was performed of 06/27/2008 and  showed([id number]. [id number]) in the right breast, no malignancy. In the left breast there was a 3.2 cm invasive ductal carcinoma, grade 1, focally extending to the anterior margin of the lower outer quadrant. There was extensive angiolymphatic invasion, but both sentinel lymph nodes on the left were negative.  The patient received left-sided postmastectomy radiation including of the left chest wall and left supraclavicular fossa to a total dose of 50.4 Gy plus a 10 Gy scar boost. She had an Oncotype DX recurrence score of 17, further discussed below. She decided to forego reconstruction. She was tested for BRCA1 and 2 and was found to be negative. Given her overall prognosis, she did not receive chemotherapy, but started tamoxifen February of 2010, with good tolerance.  RECURRENT DISEASE: Erin [name] took tamoxifen for 5 years with no evidence of active disease area did in May 2015 we decided to stop the tamoxifen because of problems with endometrial hyperplasia. 6 months later, at the November visit, she was found to have a change in a small area of scaring her left inframammary fold. She was referred to dermatology and biopsy 04/21/2014 showed ([id number]) recurrent ductal adenocarcinoma (positive for gross cystic disease fluid protein, estrogen, and progesterone). She then underwent a staging PET scan in [facility] 05/06/2014. This showed a 1.9 cm sclerotic lesion in the right ischial tuberosity, with a maximum standard uptake value of 4.6. A sclerotic lesion in the left side of the L3 vertebral body measuring 1.6 cm had no hypermetabolic activity.   Her subsequent history is as detailed below.  INTERVAL HISTORY: Erin [name] returns today for follow-up of her recurrent breast cancer.  Since her last visit here she completed her radiation treatments. She  did well during the treatments, then developed some desquamation afterwards. This is healing nicely. She denies significant fatigue. She is here today to  discuss starting anastrozole.  REVIEWOF SYSTEMS: She  Continues to work full-time. Hot flashes ", and go". There are perhaps better. They do occur several times during the day and can wake her up at night. She has some bony aches and also discomfort in her heels and back. This is better however. She is having some dental work chiefly replacing a crown. She has a little bit of a runny nose. She's noted that her blood pressure has been high in the last couple of weeks. She wonders if it because of the radiation, anxiety, or simply that her blood pressure medication is not at the right dose. Aside from that a detailed review of systems today was stable    Past Medical History  Diagnosis Date  . Hypertension   . Allergic rhinitis due to pollen   . Osteopenia   . Hypothyroidism   . Breast cancer 2010  . Wears glasses   Diverticulosis       Past Surgical History  Procedure Laterality Date  . Tonsillectomy    . Right knee arthroscopy  2008  . Inguinal hernia repair      right side as infant  . Wisdom tooth extraction    . Cyst removed      left lower jaw - at age 2 yr  . Dilatation & currettage/hysteroscopy with resectocope N/A 09/16/2013    Procedure: DILATATION & CURETTAGE/HYSTEROSCOPY WITH RESECTOCOPE;  Surgeon: Princess Bruins, MD;  Location: [facility];  Service: Gynecology;  Laterality: N/A;  1 hr.  . Mastectomy  2010    bilateral mastectomies-lt snbx-radiation post  . Colonoscopy    . Breast lumpectomy Left 06/01/2014    Procedure: WIDE LOCAL EXCISION OF RECURRENT LEFT BREAST CANCER;  Surgeon: Rolm Bookbinder, MD;  Location: [facility];  Service: General;  Laterality: Left;    FAMILY HISTORY The patient's mother died in [id number] from complications of lung cancer. The patient has not been in touch with her father her for approximately 30 years. She had no sisters, 1 brother, who is in good health. There is no breast or ovarian cancer in the family to her  knowledge.  GYNECOLOGIC HISTORY:  GX P0, menarche at around age 60, the patient continued to have periods despite being on tamoxifen, although more irregularly. 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     ADVANCED DIRECTIVES: not in place  HEALTH MAINTENANCE:      History  Substance Use Topics  . Smoking status: Never Smoker   . Smokeless tobacco: Never Used  . Alcohol Use: Yes     Comment: rare      Colonoscopy: December 2013 Erin [name])  PAP: Feb 2011  Bone density: 06/2009, normal  Cholesterol: "good"      Allergies  Allergen Reactions  . Pollen Extract   . Adhesive [Tape] Rash    MEDICATIONS:    Current Outpatient Prescriptions  Medication Sig Dispense Refill  . acyclovir ointment (ZOVIRAX) 5 % Apply 1 application topically every 3 (three) hours. As needed 5 g 5  . anastrozole (ARIMIDEX) 1 MG tablet Take 1 tablet (1 mg total) by mouth daily. 90 tablet 4  . calcium carbonate (OS-CAL) 600 MG TABS tablet Take 600 mg by mouth daily with breakfast.     . Cholecalciferol (VITAMIN  D3) 2000 UNITS capsule Take 2,000 Units by mouth daily.      Marland Kitchen goserelin (ZOLADEX) 10.8 MG injection Inject 10.8 mg into the skin once.    . hyaluronate sodium (RADIAPLEXRX) GEL Apply 1 application topically 2 (two) times daily.    Marland Kitchen levothyroxine (SYNTHROID, LEVOTHROID) 75 MCG tablet Take 1 tablet (75 mcg total) by mouth daily. 90 tablet 1  . loratadine (CLARITIN) 10 MG tablet Take 10 mg by mouth daily as needed for allergies.     Marland Kitchen losartan (COZAAR) 25 MG tablet Take 1 tablet (25 mg total) by mouth daily. 90 tablet 3  . Polyethyl Glycol-Propyl Glycol (SYSTANE OP) Apply 1 drop to eye as needed (for allergies).    . sodium chloride (OCEAN) 0.65 % SOLN nasal spray Place 1 spray into both nostrils as needed for congestion.     No current facility-administered medications for this visit.    OBJECTIVE:  Middle-aged white woman   In no acute distress    Filed Vitals:   08/29/14 0837  BP: 152/80  Pulse: 81  Temp: 98.1 F (36.7 C)  Resp: 18     Body mass index is 27.75 kg/(m^2).     ECOG performance status: 1  Sclerae unicteric, EOMs intact Oropharynx clear, dentition in  Good repair No cervical or supraclavicular adenopathy Lungs no rales or rhonchi Heart regular rate and rhythm Abd soft, nontender, positive bowel sounds MSK no focal spinal tenderness, no upper extremity lymphedema, Neuro: nonfocal, well oriented, tearful affect Breasts: Status post bilateral mastectomies. The right chest wall is unremarkable.  There is mild erythema and minimal dry desquamation over the radiation port on the left chest wall. There is no evidence of active disease. The left axilla is benign.  LABS:           Chemistry      Component Value Date/Time   NA 139 08/29/2014 0908   NA 136 08/10/2014 1008   K 4.0 08/29/2014 0908   K 4.3 08/10/2014 1008   CL 101 08/10/2014 1008   CL 105 08/10/2012 0944   CO2 26 08/29/2014 0908   CO2 30 08/10/2014 1008   BUN 18.2 08/29/2014 0908   BUN 13 08/10/2014 1008   CREATININE 0.7 08/29/2014 0908   CREATININE 0.76 08/10/2014 1008      Component Value Date/Time   CALCIUM 9.3 08/29/2014 0908   CALCIUM 9.3 08/10/2014 1008   ALKPHOS 85 08/29/2014 0908   ALKPHOS 59 02/08/2014 0834   AST 16 08/29/2014 0908   AST 17 02/08/2014 0834   ALT 17 08/29/2014 0908   ALT 14 02/08/2014 0834   BILITOT 0.75 08/29/2014 0908   BILITOT 0.9 02/08/2014 0834         Lab Results  Component Value Date   WBC 6.0 08/29/2014   HGB 14.8 08/29/2014   HCT 44.7 08/29/2014   MCV 82.5 08/29/2014   PLT 243 08/29/2014   NEUTROABS 4.0 08/29/2014    STUDIES  No results found.   ASSESSMENT: 54 y.o.  BRCA negative [place] woman ,  (1) status post bilateral mastectomies January 2010 for a left-sided T2 N0 (stage IIA) invasive ductal carcinoma, grade 1, strongly estrogen and progesterone receptor  positive, HER-2 nonamplified,   (2) Oncotype DX recurrence score of 17, predicting a 10-12% distant recurrence risk over 10 years if her only adjuvant treatment is tamoxifen for 5 years  (3) status post Left postmastectomy radiation including Left supraclavicular fossa (5040 cGy in 28 sessions)  (4) on tamoxifen  starting February of 2010, stopped May 2015 because of endometrial hyperplasia  (5) osteopenia-- T score -1.2 on repeated bone density scans 09/24/2011 and 12/12/2013  RECURRENT DISEASE (6) skin biopsy of a lesion under the left breast scar 04/21/2014 shows adenocarcinoma, gross cystic disease fluid protein, estrogen and progesterone receptor positive  (a) s/p wide excision with negative though close margins 06/01/2014, prognostic panel pending  (b) awaiting electron beam radiotherapy to be completed 08/19/2014  (7) PET scan and bone scan show only one additional area of concern, in the right ischiial tuberosity--  (a) biopsy 05/24/2014 showed no evidence of malignancy  (b) zolendronate started 06/06/2014, poorly tolerated  (c) denosumab/ Xgeva to be started June 2016  (8) goserelin monthly started 05/09/2016, consider eventual BSO  (9) anastrozole started  08/29/2014  (10) radiation to Left chest wall 4000 cGy in 20 sessions, left schest wall boost 1000 cGy in 5 session 07/12/2014 through 08/16/2014   PLAN:  Erin [name] did well with the radiation. She is now ready to start anastrozole. We again reviewed the possible toxicities, side effects and complications of this medication. In the meantime she continues on monthly goserelin. We will continue that medication until  She undergoes bilateral salpingo-oophorectomy, which might be later this year.   we discussed the starting the denosumab, which is emotionally difficult for her because of the poor experience she had with the zolendronate. We will decide when she sees me in June whether to start before or after the July 4  holidays. We will try to coordinate with her work trips so that she is able to continue in her job as efficiently as possible.   Once she is on as stable course of goserelin/anastrozole/denosumab, I will set her up for repeat scans. She understands the denosumab May "bring out" lytic bone lesions that might not have been well seen previously. Accordingly the  September scan will serve as a baseline.   at this point I do not think we need to adjust her blood pressure medicine, but if her blood pressure continues side we may want to double the losartan.  Erin [name] has a good understanding of this plan. She agrees with it. She will call with any problems that may develop before her next visit here. Chauncey Cruel, MD  08/29/2014

## 2014-08-29 NOTE — Telephone Encounter (Signed)
Appointments made and avs printed for patient °

## 2014-09-02 LAB — ESTRADIOL, ULTRA SENS: ESTRADIOL, ULTRA SENSITIVE: 2 pg/mL

## 2014-09-12 ENCOUNTER — Telehealth: Payer: Self-pay | Admitting: Oncology

## 2014-09-12 ENCOUNTER — Ambulatory Visit: Payer: BC Managed Care – PPO

## 2014-09-12 NOTE — Telephone Encounter (Signed)
Due to center event 6/1 moved f/u appointment from PM to 8am per GM (5/22 pof) - lab to be done after visit. Also call list given to AB by GM patients 7/19 f/u also moved to 8am due to call day. Left message for patient and mailed new schedule.

## 2014-09-12 NOTE — Telephone Encounter (Signed)
Returned voicemail regarding appointment for June 1.

## 2014-09-15 ENCOUNTER — Telehealth: Payer: Self-pay | Admitting: Internal Medicine

## 2014-09-21 ENCOUNTER — Ambulatory Visit: Payer: BC Managed Care – PPO | Admitting: Family Medicine

## 2014-09-21 ENCOUNTER — Encounter: Payer: Self-pay | Admitting: Family Medicine

## 2014-09-21 ENCOUNTER — Ambulatory Visit (HOSPITAL_BASED_OUTPATIENT_CLINIC_OR_DEPARTMENT_OTHER): Payer: BC Managed Care – PPO | Admitting: Oncology

## 2014-09-21 ENCOUNTER — Ambulatory Visit: Payer: BC Managed Care – PPO | Admitting: Internal Medicine

## 2014-09-21 ENCOUNTER — Other Ambulatory Visit: Payer: BC Managed Care – PPO

## 2014-09-21 ENCOUNTER — Ambulatory Visit (INDEPENDENT_AMBULATORY_CARE_PROVIDER_SITE_OTHER): Payer: BC Managed Care – PPO | Admitting: Family Medicine

## 2014-09-21 VITALS — BP 148/73 | HR 88 | Temp 98.2°F | Resp 18 | Ht 63.0 in | Wt 158.1 lb

## 2014-09-21 VITALS — BP 134/79 | HR 81 | Temp 98.6°F | Ht 65.0 in | Wt 157.0 lb

## 2014-09-21 DIAGNOSIS — Z79811 Long term (current) use of aromatase inhibitors: Secondary | ICD-10-CM | POA: Diagnosis not present

## 2014-09-21 DIAGNOSIS — I10 Essential (primary) hypertension: Secondary | ICD-10-CM | POA: Diagnosis not present

## 2014-09-21 DIAGNOSIS — M8580 Other specified disorders of bone density and structure, unspecified site: Secondary | ICD-10-CM

## 2014-09-21 DIAGNOSIS — E038 Other specified hypothyroidism: Secondary | ICD-10-CM | POA: Diagnosis not present

## 2014-09-21 DIAGNOSIS — C50412 Malignant neoplasm of upper-outer quadrant of left female breast: Secondary | ICD-10-CM | POA: Diagnosis not present

## 2014-09-21 DIAGNOSIS — C50912 Malignant neoplasm of unspecified site of left female breast: Secondary | ICD-10-CM

## 2014-09-21 MED ORDER — LOSARTAN POTASSIUM 25 MG PO TABS: 25.0000 mg | ORAL_TABLET | Freq: Every day | ORAL | Status: DC

## 2014-09-21 MED ORDER — LEVOTHYROXINE SODIUM 75 MCG PO TABS: 75.0000 ug | ORAL_TABLET | Freq: Every day | ORAL | Status: DC

## 2014-09-21 NOTE — Progress Notes (Signed)
Patches [name]  MR#: [id number]    PCP: Drema Pry, DO GYN: Thomes Cake SU: Rolm Bookbinder OTHER MD: Crista Luria, Ulice Dash Pyrtle, [place] Vernia Buff  CHIEF COMPLAINT: Recurrent breast cancer  CURRENT THERAPY: Goserelin, anastrozole; denosumab    BREAST CANCER HISTORY From the original intake note 04/07/2011:  [name] had routine screening mammography December of 2009 at the [facility] in [place], [place], showing an increasing asymmetry in her left breast. She was recalled for additional views December 29. She was noted to have heterogeneously dense breasts. It was a 5 cm area of architectural distortion in the lateral aspect of the left breast with no associated calcifications. There was a second lesion medial to this. Ultrasound showed a highly suspicious hypoechoic irregularly marginated mass measuring 3 cm at the 2:30 position 5 cm from the nipple. This was palpable to the mammographer. The second area in question I measured 7 mm and a third lesion was noted measuring 5 mm. Some left axillary lymph nodes were morphologically normal.  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 Bilateral breast MRIs were performed 05/04/2008 and showed, in the left breast, a large lobulated mass measuring up to 4.3 cm, and including both of the apparently separate masses the previously biopsied. In the right breast there were 2 indeterminate lesions. These were evaluated further with breast specific gamma imaging performed January 14 in both lesions were negative. The lesion in the left breast was markedly abnormal.  Given this complex history and with the background of significant breast density, the patient opted for bilateral mastectomies with left sentinel lymph node sampling. This was performed of 06/27/2008 and showed([id number])  in the right breast, no malignancy. In the left breast there was a 3.2 cm invasive ductal carcinoma, grade 1, focally extending to the anterior margin of the lower outer quadrant. There was extensive angiolymphatic invasion, but both sentinel lymph nodes on the left were negative.  The patient received left-sided postmastectomy radiation including of the left chest wall and left supraclavicular fossa to a total dose of 50.4 Gy plus a 10 Gy scar boost. She had an Oncotype DX recurrence score of 17, further discussed below. She decided to forego reconstruction. She was tested for BRCA1 and 2 and was found to be negative. Given her overall prognosis, she did not receive chemotherapy, but started tamoxifen February of 2010, with good tolerance.  RECURRENT DISEASE: INITIAL SUMMARY Erin took tamoxifen for 5 years with no evidence of active disease area did in May 2015 we decided to stop the tamoxifen because of problems with endometrial hyperplasia. 6 months later, at the November visit, she was found to have a change in a small area of scaring her left inframammary fold. She was referred to dermatology and biopsy 04/21/2014 showed ([id number]) recurrent ductal adenocarcinoma (positive for gross cystic disease fluid protein, estrogen, and progesterone). She then underwent a staging PET scan in [facility] 05/06/2014. This showed a 1.9 cm sclerotic lesion in the right ischial tuberosity, with a maximum standard uptake value of 4.6. A sclerotic lesion in the left side of the L3 vertebral body measuring 1.6 cm had no hypermetabolic activity.  Her subsequent history is as detailed below.  INTERVAL HISTORY: Erin returns today for follow-up of her recurrent breast cancer. She continues on anastrozole and goserelin. We checked an Vandiver and estradiol and both  are appropriately low. She understands that if the [facility] were to start rising we would start worrying that they goserelin is not suppressing the  pituitary as it should. She does have some creaky nests as she describes it probably related to the anastrozole. She also has more frequent and more intense hot flashes. There is some vaginal dryness as well. At this point she does not want any interventions for those issues.  REVIEWOF SYSTEMS: She went on a business trip to [place] and did a lot of walking. She experienced some ankle swelling. This is not something she has had previously and she worries if it could possibly be related to the earlier zolendronate treatments. She continues to have seasonal sinus problems. She takes Claritin for that. Aside from these issues a detailed review of systems today was noncontributory    Past Medical History  Diagnosis Date  . Hypertension   . Allergic rhinitis due to pollen   . Osteopenia   . Hypothyroidism   . Breast cancer 2010  . Wears glasses   Diverticulosis       Past Surgical History  Procedure Laterality Date  . Tonsillectomy    . Right knee arthroscopy  2008  . Inguinal hernia repair      right side as infant  . Wisdom tooth extraction    . Cyst removed      left lower jaw - at age 17 yr  . Dilatation & currettage/hysteroscopy with resectocope N/A 09/16/2013    Procedure: DILATATION & CURETTAGE/HYSTEROSCOPY WITH RESECTOCOPE;  Surgeon: Princess Bruins, MD;  Location: [facility];  Service: Gynecology;  Laterality: N/A;  1 hr.  . Mastectomy  2010    bilateral mastectomies-lt snbx-radiation post  . Colonoscopy    . Breast lumpectomy Left 06/01/2014    Procedure: WIDE LOCAL EXCISION OF RECURRENT LEFT BREAST CANCER;  Surgeon: Rolm Bookbinder, MD;  Location: [facility];  Service: General;  Laterality: Left;    FAMILY HISTORY The patient's mother died in 6644 from complications of lung cancer. The patient has not been in touch with her father her for approximately 30 years. She had no sisters, 1 brother, who is in good health. There is no breast or ovarian cancer in the  family to her knowledge.  GYNECOLOGIC HISTORY:  GX P0, menarche at around age 76, the patient continued to have periods despite being on tamoxifen, although more irregularly. 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     ADVANCED DIRECTIVES: not in place  HEALTH MAINTENANCE:      History  Substance Use Topics  . Smoking status: Never Smoker   . Smokeless tobacco: Never Used  . Alcohol Use: Yes     Comment: rare      Colonoscopy: December 2013 Collene Mares)  PAP: Feb 2011  Bone density: 06/2009, normal  Cholesterol: "good"      Allergies  Allergen Reactions  . Pollen Extract   . Adhesive [Tape] Rash    MEDICATIONS:    Current Outpatient Prescriptions  Medication Sig Dispense Refill  . acyclovir ointment (ZOVIRAX) 5 % Apply 1 application topically every 3 (three) hours. As needed 5 g 5  . anastrozole (ARIMIDEX) 1 MG tablet Take 1 tablet (1 mg total) by mouth daily. 90 tablet 4  . calcium carbonate (OS-CAL) 600 MG TABS tablet Take 600 mg by mouth daily with breakfast.     . Cholecalciferol (VITAMIN D3) 2000  UNITS capsule Take 2,000 Units by mouth daily.      Marland Kitchen goserelin (ZOLADEX) 10.8 MG injection Inject 10.8 mg into the skin once.    . hyaluronate sodium (RADIAPLEXRX) GEL Apply 1 application topically 2 (two) times daily.    Marland Kitchen levothyroxine (SYNTHROID, LEVOTHROID) 75 MCG tablet Take 1 tablet (75 mcg total) by mouth daily. 90 tablet 1  . loratadine (CLARITIN) 10 MG tablet Take 10 mg by mouth daily as needed for allergies.     Marland Kitchen losartan (COZAAR) 25 MG tablet Take 1 tablet (25 mg total) by mouth daily. 90 tablet 3  . Polyethyl Glycol-Propyl Glycol (SYSTANE OP) Apply 1 drop to eye as needed (for allergies).    . sodium chloride (OCEAN) 0.65 % SOLN nasal spray Place 1 spray into both nostrils as needed for congestion.     No current facility-administered medications for this visit.    OBJECTIVE:  Middle-aged  white woman who appears stated age    29 Vitals:   09/21/14 0801  BP: 148/73  Pulse: 88  Temp: 98.2 F (36.8 C)  Resp: 18     Body mass index is 28.01 kg/(m^2).     ECOG performance status: 1  Sclerae unicteric, EOMs intact Oropharynx clear, dentition in good repair No cervical or supraclavicular adenopathy Lungs no rales or rhonchi Heart regular rate and rhythm Abd soft, nontender, positive bowel sounds MSK no focal spinal tenderness, no upper extremity lymphedema Neuro: nonfocal, well oriented, appropriate affect Breasts: Status post bilateral mastectomies. There is no evidence of chest wall recurrence. On the left, there are some telangiectasias and very minimal erythema from the recent radiation treatment. Both axillae are benign.   LABS:           Chemistry      Component Value Date/Time   NA 139 08/29/2014 0908   NA 136 08/10/2014 1008   K 4.0 08/29/2014 0908   K 4.3 08/10/2014 1008   CL 101 08/10/2014 1008   CL 105 08/10/2012 0944   CO2 26 08/29/2014 0908   CO2 30 08/10/2014 1008   BUN 18.2 08/29/2014 0908   BUN 13 08/10/2014 1008   CREATININE 0.7 08/29/2014 0908   CREATININE 0.76 08/10/2014 1008      Component Value Date/Time   CALCIUM 9.3 08/29/2014 0908   CALCIUM 9.3 08/10/2014 1008   ALKPHOS 85 08/29/2014 0908   ALKPHOS 59 02/08/2014 0834   AST 16 08/29/2014 0908   AST 17 02/08/2014 0834   ALT 17 08/29/2014 0908   ALT 14 02/08/2014 0834   BILITOT 0.75 08/29/2014 0908   BILITOT 0.9 02/08/2014 0834         Lab Results  Component Value Date   WBC 6.0 08/29/2014   HGB 14.8 08/29/2014   HCT 44.7 08/29/2014   MCV 82.5 08/29/2014   PLT 243 08/29/2014   NEUTROABS 4.0 08/29/2014    STUDIES  No results found.   ASSESSMENT: 54 y.o.  BRCA negative [place] woman ,  (1) status post bilateral mastectomies January 2010 for a left-sided T2 N0 (stage IIA) invasive ductal carcinoma, grade 1, strongly estrogen and progesterone receptor positive,  HER-2 nonamplified,   (2) Oncotype DX recurrence score of 17, predicting a 10-12% distant recurrence risk over 10 years if her only adjuvant treatment is tamoxifen for 5 years  (3) status post Left postmastectomy radiation including Left supraclavicular fossa (5040 cGy in 28 sessions)  (4) on tamoxifen starting February of 2010, stopped May 2015 because of endometrial hyperplasia  (  5) osteopenia-- T score -1.2 on repeated bone density scans 09/24/2011 and 12/12/2013  RECURRENT DISEASE (6) skin biopsy of a lesion under the left breast scar 04/21/2014 shows adenocarcinoma, gross cystic disease fluid protein, estrogen and progesterone receptor positive  (a) s/p wide excision with negative though close margins 06/01/2014, prognostic panel pending  (b) awaiting electron beam radiotherapy to be completed 08/19/2014  (7) PET scan and bone scan show only one additional area of concern, in the right ischiial tuberosity--  (a) biopsy 05/24/2014 showed no evidence of malignancy  (b) zolendronate started 06/06/2014, poorly tolerated  (c) denosumab/ Xgeva to be started June 2016  (8) goserelin monthly started 05/09/2016, consider eventual BSO  (9) anastrozole started  08/29/2014  (10) radiation to Left chest wall 4000 cGy in 20 sessions, left schest wall boost 1000 cGy in 5 session 07/12/2014 through 08/16/2014   PLAN:  Erin and I discussed her situation approximately 45 minutes. There is a temporal association between the ankle swelling she experienced while walking in [place] and the fact that she did not have this problem that she recalls before she received the zolendronate. Nevertheless it hard for me to make a cause [place] assumption there is since I don't know what the mechanism would be. Certainly she has had some arthritis issues here and there since she had the zolendronate and possibly those could be related  Needless to say she remains very concerned about receiving the denial  some up next Monday. We reviewed how it works, the possible side effects and complications, and also the reason we are doing it. Basically she has one suspicious spot in the bone which would make her tumor stage IV. Biopsy of that area was negative but we really have no other explanation for what that might be. We are going to try the Delton See couple of times and then repeat a bone scan. If we see multiple bone spots I think that would strength in the case that we are dealing with metastatic disease. It might also give [place] another area to biopsy. If that remains the only area in question, we could either consider rebiopsy or consider radiation to that area.  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  She will receive goserelin and then also Babb June 6 and then the next dose will be July 5. After that she would like to switch to a Friday and which particular dates we choose will depend on how she tolerates these treatments and also what her travel schedule.  Erin has a good understanding of this plan. She agrees with it. She will call with any problems that may develop before her next visit here. Chauncey Cruel, MD  09/21/2014

## 2014-09-21 NOTE — Progress Notes (Signed)
Pre visit review using our clinic review tool, if applicable. No additional management support is needed unless otherwise documented below in the visit note. 

## 2014-09-21 NOTE — Telephone Encounter (Signed)
done

## 2014-09-23 NOTE — Progress Notes (Signed)
   Subjective:    Patient ID: Erin [name], female    DOB: [birth year], 54 y.o.   MRN: [id number]  HPI Here for follow up on HTN and hypothyroidism while Dr. Shawna Orleans is out of the office. She is seeing Dr. [name] for her breast cancer treatments. She feels well in general.    Review of Systems  Constitutional: Negative.   Respiratory: Negative.   Cardiovascular: Negative.   Endocrine: Negative.        Objective:   Physical Exam  Constitutional: She appears well-developed and well-nourished.  Neck: No thyromegaly present.  Cardiovascular: Normal rate, regular rhythm, normal heart sounds and intact distal pulses.   Pulmonary/Chest: Effort normal and breath sounds normal.  Lymphadenopathy:    She has no cervical adenopathy.          Assessment & Plan:  Her HTN is stable, as is her thyroid status. Her recent TSH was in range. Her meds were refilled.

## 2014-09-26 ENCOUNTER — Ambulatory Visit (HOSPITAL_BASED_OUTPATIENT_CLINIC_OR_DEPARTMENT_OTHER): Payer: BC Managed Care – PPO

## 2014-09-26 ENCOUNTER — Other Ambulatory Visit: Payer: BC Managed Care – PPO

## 2014-09-26 ENCOUNTER — Encounter: Payer: Self-pay | Admitting: Radiation Oncology

## 2014-09-26 VITALS — BP 149/78 | HR 86 | Temp 98.5°F

## 2014-09-26 DIAGNOSIS — C50412 Malignant neoplasm of upper-outer quadrant of left female breast: Secondary | ICD-10-CM

## 2014-09-26 DIAGNOSIS — Z5111 Encounter for antineoplastic chemotherapy: Secondary | ICD-10-CM | POA: Diagnosis not present

## 2014-09-26 DIAGNOSIS — N8500 Endometrial hyperplasia, unspecified: Secondary | ICD-10-CM

## 2014-09-26 DIAGNOSIS — C50912 Malignant neoplasm of unspecified site of left female breast: Secondary | ICD-10-CM

## 2014-09-26 DIAGNOSIS — M8580 Other specified disorders of bone density and structure, unspecified site: Secondary | ICD-10-CM

## 2014-09-26 MED ORDER — GOSERELIN ACETATE 3.6 MG ~~LOC~~ IMPL
3.6000 mg | DRUG_IMPLANT | Freq: Once | SUBCUTANEOUS | Status: AC
  Administered 2014-09-26: 3.6 mg via SUBCUTANEOUS
  Filled 2014-09-26: qty 3.6

## 2014-09-26 MED ORDER — DENOSUMAB 120 MG/1.7ML ~~LOC~~ SOLN
120.0000 mg | Freq: Once | SUBCUTANEOUS | Status: AC
  Administered 2014-09-26: 120 mg via SUBCUTANEOUS
  Filled 2014-09-26: qty 1.7

## 2014-09-26 NOTE — Patient Instructions (Signed)
Denosumab injection What is this medicine? DENOSUMAB (den oh sue mab) slows bone breakdown. Prolia is used to treat osteoporosis in women after menopause and in men. Xgeva is used to prevent bone fractures and other bone problems caused by cancer bone metastases. Xgeva is also used to treat giant cell tumor of the bone. This medicine may be used for other purposes; ask your health care provider or pharmacist if you have questions. COMMON BRAND NAME(S): Prolia, XGEVA What should I tell my health care provider before I take this medicine? They need to know if you have any of these conditions: -dental disease -eczema -infection or history of infections -kidney disease or on dialysis -low blood calcium or vitamin D -malabsorption syndrome -scheduled to have surgery or tooth extraction -taking medicine that contains denosumab -thyroid or parathyroid disease -an unusual reaction to denosumab, other medicines, foods, dyes, or preservatives -pregnant or trying to get pregnant -breast-feeding How should I use this medicine? This medicine is for injection under the skin. It is given by a health care professional in a hospital or clinic setting. If you are getting Prolia, a special MedGuide will be given to you by the pharmacist with each prescription and refill. Be sure to read this information carefully each time. For Prolia, talk to your pediatrician regarding the use of this medicine in children. Special care may be needed. For Xgeva, talk to your pediatrician regarding the use of this medicine in children. While this drug may be prescribed for children as young as 13 years for selected conditions, precautions do apply. Overdosage: If you think you've taken too much of this medicine contact a poison control center or emergency room at once. Overdosage: If you think you have taken too much of this medicine contact a poison control center or emergency room at once. NOTE: This medicine is only for  you. Do not share this medicine with others. What if I miss a dose? It is important not to miss your dose. Call your doctor or health care professional if you are unable to keep an appointment. What may interact with this medicine? Do not take this medicine with any of the following medications: -other medicines containing denosumab This medicine may also interact with the following medications: -medicines that suppress the immune system -medicines that treat cancer -steroid medicines like prednisone or cortisone This list may not describe all possible interactions. Give your health care provider a list of all the medicines, herbs, non-prescription drugs, or dietary supplements you use. Also tell them if you smoke, drink alcohol, or use illegal drugs. Some items may interact with your medicine. What should I watch for while using this medicine? Visit your doctor or health care professional for regular checks on your progress. Your doctor or health care professional may order blood tests and other tests to see how you are doing. Call your doctor or health care professional if you get a cold or other infection while receiving this medicine. Do not treat yourself. This medicine may decrease your body's ability to fight infection. You should make sure you get enough calcium and vitamin D while you are taking this medicine, unless your doctor tells you not to. Discuss the foods you eat and the vitamins you take with your health care professional. See your dentist regularly. Brush and floss your teeth as directed. Before you have any dental work done, tell your dentist you are receiving this medicine. Do not become pregnant while taking this medicine or for 5 months after stopping   it. Women should inform their doctor if they wish to become pregnant or think they might be pregnant. There is a potential for serious side effects to an unborn child. Talk to your health care professional or pharmacist for more  information. What side effects may I notice from receiving this medicine? Side effects that you should report to your doctor or health care professional as soon as possible: -allergic reactions like skin rash, itching or hives, swelling of the face, lips, or tongue -breathing problems -chest pain -fast, irregular heartbeat -feeling faint or lightheaded, falls -fever, chills, or any other sign of infection -muscle spasms, tightening, or twitches -numbness or tingling -skin blisters or bumps, or is dry, peels, or red -slow healing or unexplained pain in the mouth or jaw -unusual bleeding or bruising Side effects that usually do not require medical attention (Report these to your doctor or health care professional if they continue or are bothersome.): -muscle pain -stomach upset, gas This list may not describe all possible side effects. Call your doctor for medical advice about side effects. You may report side effects to FDA at 1-800-FDA-1088. Where should I keep my medicine? This medicine is only given in a clinic, doctor's office, or other health care setting and will not be stored at home. NOTE: This sheet is a summary. It may not cover all possible information. If you have questions about this medicine, talk to your doctor, pharmacist, or health care provider.  2015, Elsevier/Gold Standard. (2011-10-07 12:37:47)  

## 2014-09-27 ENCOUNTER — Encounter: Payer: Self-pay | Admitting: Radiation Oncology

## 2014-09-27 ENCOUNTER — Ambulatory Visit
Admission: RE | Admit: 2014-09-27 | Discharge: 2014-09-27 | Disposition: A | Discharge status: Home / Self Care | Payer: BC Managed Care – PPO | Source: Ambulatory Visit | Attending: Radiation Oncology | Admitting: Radiation Oncology

## 2014-09-27 VITALS — BP 151/84 | HR 93 | Temp 98.2°F | Ht 65.0 in | Wt 157.1 lb

## 2014-09-27 DIAGNOSIS — C50912 Malignant neoplasm of unspecified site of left female breast: Secondary | ICD-10-CM

## 2014-09-27 HISTORY — DX: Personal history of irradiation: Z92.3

## 2014-09-27 NOTE — Progress Notes (Addendum)
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  She tells me she developed a moist desquamation following completion of radiation therapy, but has since reepithelialized.  Dr. [name] has her on anastrozole/Zoladex in addition to [facility].  She has been borderline hypotensive and she is not sure of her recent medications are contributing to this.  Physical examination: Alert and oriented. Filed Vitals:   09/27/14 1503  BP: 151/84  Pulse: 93  Temp: 98.2 F (36.8 C)   Head and neck examination: Grossly unremarkable.  Nodes: Without palpable cervical, supraclavicular, or axillary lymphadenopathy.  Chest: Bilateral mastectomies.  There is residual erythema along her anterolateral left chest wall centered along her excisional biopsy scar.  There has been complete reepithelialization.  There is telangiectasia medially as previously noted from her electron beam boost in 2010.  No visible or palpable evidence for recurrent disease.  Extremities: Without edema.  Impression: History of recurrent invasive ductal carcinoma involving her left chest wall.  She is doing well posttreatment.  Plan: She may return to her normal activities including swimming.  She will continue under the guidance and care of Dr. [name]. Dr. [name] plans on repeating a bone scan down the road.  I would like to see biopsy proof of metastatic disease before considering radiosurgery.  I've not scheduled Erin [name] for a formal follow-up visit, but I would be more than happy see her in the future should the need arise.

## 2014-09-27 NOTE — Progress Notes (Signed)
Ms. Lansberry reports increase in energy since end of XRT.  Mild redness of her left chest wall.  Denies any pain at this time.

## 2014-09-27 NOTE — Addendum Note (Signed)
Encounter addended by: Benn Moulder, RN on: 09/27/2014  4:47 PM<BR>     Documentation filed: Demographics Visit

## 2014-09-30 ENCOUNTER — Encounter (HOSPITAL_COMMUNITY): Payer: Self-pay

## 2014-10-10 ENCOUNTER — Ambulatory Visit: Payer: BC Managed Care – PPO

## 2014-10-24 NOTE — Progress Notes (Signed)
Erin [name]  MR#: [id number]    PCP: Drema Pry, DO GYN: Thomes Cake SU: Rolm Bookbinder OTHER MD: Crista Luria, Ulice Dash Pyrt[place], [place] Vernia Buff  CHIEF COMPLAINT: Recurrent breast cancer  CURRENT THERAPY: Goserelin, anastrozole; denosumab    BREAST CANCER HISTORY From the original intake note 04/07/2011:  Erin [name] had routine screening mammography December of 2009 at the [facility] in [place], [place], showing an increasing asymmetry in her left breast. She was recalled for additional views December 29. She was noted to have heterogeneously dense breasts. It was a 5 cm area of architectural distortion in the lateral aspect of the left breast with no associated calcifications. There was a second lesion medial to this. Ultrasound showed a highly suspicious hypoechoic irregularly marginated mass measuring 3 cm at the 2:30 position 5 cm from the nipple. This was palpable to the mammographer. The second area in question I measured 7 mm and a third lesion was noted measuring 5 mm. Some left axillary lymph nodes were morphologically normal.  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 Bilateral breast MRIs were performed 05/04/2008 and showed, in the left breast, a large lobulated mass measuring up to 4.3 cm, and including both of the apparently separate masses the previously biopsied. In the right breast there were 2 indeterminate lesions. These were evaluated further with breast specific gamma imaging performed January 14 in both lesions were negative. The lesion in the left breast was markedly abnormal.  Given this complex history and with the background of significant breast density, the patient opted for bilateral mastectomies with left sentinel lymph node sampling. This was performed of 06/27/2008 and showed([id number])  in the right breast, no malignancy. In the left breast there was a 3.2 cm invasive ductal carcinoma, grade 1, focally extending to the anterior margin of the lower outer quadrant. There was extensive angiolymphatic invasion, but both sentinel lymph nodes on the left were negative.  The patient received left-sided postmastectomy radiation including of the left chest wall and left supraclavicular fossa to a total dose of 50.4 Gy plus a 10 Gy scar boost. She had an Oncotype DX recurrence score of 17, further discussed below. She decided to forego reconstruction. She was tested for BRCA1 and 2 and was found to be negative. Given her overall prognosis, she did not receive chemotherapy, but started tamoxifen February of 2010, with good tolerance.  RECURRENT DISEASE: INITIAL SUMMARY Erin [name] took tamoxifen for 5 years with no evidence of active disease area did in May 2015 we decided to stop the tamoxifen because of problems with endometrial hyperplasia. 6 months later, at the November visit, she was found to have a change in a small area of scaring her left inframammary fold. She was referred to dermatology and biopsy 04/21/2014 showed ([id number]) recurrent ductal adenocarcinoma (positive for gross cystic disease fluid protein, estrogen, and progesterone). She then underwent a staging PET scan in [facility] 05/06/2014. This showed a 1.9 cm sclerotic lesion in the right ischial tuberosity, with a maximum standard uptake value of 4.6. A sclerotic lesion in the left side of the L3 vertebral body measuring 1.6 cm had no hypermetabolic activity.  Her subsequent history is as detailed below.  INTERVAL HISTORY: Erin [name] returns today for follow-up of her recurrent breast cancer. She will receive her second dose of denosumab today. She tolerated the first dose  well. She did not have any other aches and pains that she experienced with the zolendronate. She did take Tums several times a day for up to 5 days and  this made her slightly nauseated. She continues on the goserelin and anastrozole which are making her menopausal and of course taking away her estrogen. There not flashes are still there, possibly a little bit better than before. She is continuing to lose some hair but this has not progressed. She has been able to continue to work full-time and travel, even though when she flies she gets slight ankle swelling for about a day.  REVIEWOF SYSTEMS: Aside from these issues she has occasional soreness around her scars, she has rare headaches, and she is concerned about her blood pressure. She saw Dr. Sarajane Jews and he upped her losartan to 50 mg daily. She is wondering if she needs to increase the dose further. She brought me several readings to review. A detailed review of systems today was otherwise stable  Past Medical History  Diagnosis Date  . Hypertension   . Allergic rhinitis due to pollen   . Osteopenia   . Hypothyroidism   . Breast cancer 2010  . Wears glasses   . S/P radiation therapy 07/12/2014 through 08/16/2014     Left chest wall 4000 cGy in 20 sessions, left chest wall boost 1000 cGy in 5 sessions  Diverticulosis       Past Surgical History  Procedure Laterality Date  . Tonsillectomy    . Right knee arthroscopy  2008  . Inguinal hernia repair      right side as infant  . Wisdom tooth extraction    . Cyst removed      left lower jaw - at age 89 yr  . Dilatation & currettage/hysteroscopy with resectocope N/A 09/16/2013    Procedure: DILATATION & CURETTAGE/HYSTEROSCOPY WITH RESECTOCOPE;  Surgeon: Princess Bruins, MD;  Location: [facility];  Service: Gynecology;  Laterality: N/A;  1 hr.  . Mastectomy  2010    bilateral mastectomies-lt snbx-radiation post  . Colonoscopy    . Breast lumpectomy Left 06/01/2014    Procedure: WIDE LOCAL EXCISION OF RECURRENT LEFT BREAST CANCER;  Surgeon: Rolm Bookbinder, MD;  Location: [facility];  Service: General;  Laterality: Left;    FAMILY HISTORY The patient's mother died in [id number] from complications of lung cancer. The patient has not been in touch with her father her for approximately 30 years. She had no sisters, 1 brother, who is in good health. There is no breast or ovarian cancer in the family to her knowledge.  GYNECOLOGIC HISTORY:  GX P0, menarche at around age 76, the patient continued to have periods despite being on tamoxifen, although more irregularly. 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     ADVANCED DIRECTIVES: not in place  HEALTH MAINTENANCE:      History  Substance Use Topics  . Smoking status: Never Smoker   . Smokeless tobacco: Never Used  . Alcohol Use: 0.0 oz/week    0 Standard drinks or equivalent per week     Comment: rare      Colonoscopy: December 2013 [facility] Mares)  PAP: Feb 2011  Bone density: 06/2009, normal  Cholesterol: "good"      Allergies  Allergen Reactions  . Pollen Extract   . Adhesive [Tape] Rash    MEDICATIONS:    Current Outpatient Prescriptions  Medication Sig  Dispense Refill  . acyclovir ointment (ZOVIRAX) 5 % Apply 1 application topically every 3 (three) hours. As needed 5 g 5  . anastrozole (ARIMIDEX) 1 MG tablet Take 1 tablet (1 mg total) by mouth daily. 90 tablet 4  . calcium carbonate (OS-CAL) 600 MG TABS tablet Take 600 mg by mouth daily with breakfast.     . Cholecalciferol (VITAMIN D3) 2000 UNITS capsule Take 2,000 Units by mouth daily.      . Denosumab (XGEVA [place]) Inject 120 mg into the skin every 30 (thirty) days.     Marland Kitchen goserelin (ZOLADEX) 10.8 MG injection Inject 10.8 mg into the skin every 30 (thirty) days.     . hyaluronate sodium (RADIAPLEXRX) GEL Apply 1 application topically 2 (two) times daily.    Marland Kitchen levothyroxine (SYNTHROID, LEVOTHROID) 75 MCG tablet Take 1 tablet (75 mcg total) by mouth daily. 90 tablet 3  . loratadine  (CLARITIN) 10 MG tablet Take 10 mg by mouth daily as needed for allergies.     Marland Kitchen losartan (COZAAR) 25 MG tablet Take 1 tablet (25 mg total) by mouth daily. 90 tablet 3  . losartan (COZAAR) 50 MG tablet Take 1 tablet (50 mg total) by mouth daily. 90 tablet 4  . Polyethyl Glycol-Propyl Glycol (SYSTANE OP) Apply 1 drop to eye as needed (for allergies).    . sodium chloride (OCEAN) 0.65 % SOLN nasal spray Place 1 spray into both nostrils as needed for congestion.     No current facility-administered medications for this visit.    OBJECTIVE:  Middle-aged white woman in no acute distress    Filed Vitals:   10/25/14 0836  BP: 151/85  Pulse: 71  Temp: 98.4 F (36.9 C)  Resp: 18     Body mass index is 26.26 kg/(m^2).     ECOG performance status: 0  Sclerae unicteric, pupils round and equal Oropharynx clear and moist-- no thrush or other lesions No cervical or supraclavicular adenopathy Lungs no rales or rhonchi Heart regular rate and rhythm Abd soft, nontender, positive bowel sounds MSK no focal spinal tenderness, no upper extremity lymphedema Neuro: nonfocal, well oriented, appropriate affect Breasts: Status post bilateral mastectomies. There is no evidence of chest wall recurrence. There are telangiectasias on the left, as previously noted. Both axillae are benign.    LABS:           Chemistry      Component Value Date/Time   NA 139 10/25/2014 0815   NA 136 08/10/2014 1008   K 3.5 10/25/2014 0815   K 4.3 08/10/2014 1008   CL 101 08/10/2014 1008   CL 105 08/10/2012 0944   CO2 25 10/25/2014 0815   CO2 30 08/10/2014 1008   BUN 13.3 10/25/2014 0815   BUN 13 08/10/2014 1008   CREATININE 0.8 10/25/2014 0815   CREATININE 0.76 08/10/2014 1008      Component Value Date/Time   CALCIUM 8.9 10/25/2014 0815   CALCIUM 9.3 08/10/2014 1008   ALKPHOS 80 10/25/2014 0815   ALKPHOS 59 02/08/2014 0834   AST 19 10/25/2014 0815   AST 17 02/08/2014 0834   ALT 15 10/25/2014 0815   ALT 14  02/08/2014 0834   BILITOT 1.26* 10/25/2014 0815   BILITOT 0.9 02/08/2014 0834         Lab Results  Component Value Date   WBC 5.0 10/25/2014   HGB 14.9 10/25/2014   HCT 44.2 10/25/2014   MCV 84.1 10/25/2014   PLT 224 10/25/2014   NEUTROABS 3.0  10/25/2014    STUDIES  No results found.   ASSESSMENT: 54 y.o.  BRCA negative [place] woman ,  (1) status post bilateral mastectomies January 2010 for a left-sided T2 N0 (stage IIA) invasive ductal carcinoma, grade 1, strongly estrogen and progesterone receptor positive, HER-2 nonamplified,   (2) Oncotype DX recurrence score of 17, predicting a 10-12% distant recurrence risk over 10 years if her only adjuvant treatment is tamoxifen for 5 years  (3) status post Left postmastectomy radiation including Left supraclavicular fossa (5040 cGy in 28 sessions)  (4) on tamoxifen starting February of 2010, stopped May 2015 because of endometrial hyperplasia  (5) osteopenia-- T score -1.2 on repeated bone density scans 09/24/2011 and 12/12/2013  RECURRENT DISEASE (6) skin biopsy of a lesion under the left breast scar 04/21/2014 shows adenocarcinoma, gross cystic disease fluid protein, estrogen and progesterone receptor positive  (a) s/p wide excision with negative though close margins 06/01/2014, prognostic panel pending  (b) radiation to Left chest wall 4000 cGy in 20 sessions, left schest wall boost 1000 cGy in 5 session 07/12/2014 through 08/16/2014  (7) PET scan and bone scan show only one additional area of concern, in the right ischiial tuberosity--  (a) biopsy 05/24/2014 showed no evidence of malignancy  (b) zolendronate started 06/06/2014, poorly tolerated  (c) denosumab/ Xgeva started 09/26/2014  (8) goserelin monthly started 05/09/2016, consider eventual BSO  (9) anastrozole started  08/29/2014  (10) considering palbociclib   PLAN:  Erin [name] is tolerating the denosumab well. We are making slight changes in the medicines  she takes around each dose, namely she will not take Tums but take the Os-Cal 3 times a day for 2 days. If that works for her she will cut it down to twice a day for 2 days with the next dose.  Her blood pressure is better but not quite where she wants it. Where going to increase the losartan to 75 mg daily. If that doesn't quite do it but she tolerates it well we will increase 200 mg which would be a more usual dose.  We are continuing the goserelin and denosumab and she will see me the first week in September. A few days before that she will have a bone scan. Depending on those results we will consider adding Palbociclib or considering radiation or rebiopsy if there is still only 1 bony spot noted.  Erin [name] knows to call for any problems that may develop before her next visit here. Chauncey Cruel, MD  10/25/2014

## 2014-10-25 ENCOUNTER — Ambulatory Visit (HOSPITAL_BASED_OUTPATIENT_CLINIC_OR_DEPARTMENT_OTHER): Payer: BC Managed Care – PPO

## 2014-10-25 ENCOUNTER — Telehealth: Payer: Self-pay | Admitting: General Practice

## 2014-10-25 ENCOUNTER — Other Ambulatory Visit: Payer: BC Managed Care – PPO

## 2014-10-25 ENCOUNTER — Other Ambulatory Visit (HOSPITAL_BASED_OUTPATIENT_CLINIC_OR_DEPARTMENT_OTHER): Payer: BC Managed Care – PPO

## 2014-10-25 ENCOUNTER — Ambulatory Visit (HOSPITAL_BASED_OUTPATIENT_CLINIC_OR_DEPARTMENT_OTHER): Payer: BC Managed Care – PPO | Admitting: Oncology

## 2014-10-25 ENCOUNTER — Telehealth: Payer: Self-pay | Admitting: Oncology

## 2014-10-25 VITALS — BP 151/85 | HR 71 | Temp 98.4°F | Resp 18 | Ht 65.0 in | Wt 157.8 lb

## 2014-10-25 DIAGNOSIS — C50412 Malignant neoplasm of upper-outer quadrant of left female breast: Secondary | ICD-10-CM

## 2014-10-25 DIAGNOSIS — E039 Hypothyroidism, unspecified: Secondary | ICD-10-CM

## 2014-10-25 DIAGNOSIS — N8500 Endometrial hyperplasia, unspecified: Secondary | ICD-10-CM

## 2014-10-25 DIAGNOSIS — M8580 Other specified disorders of bone density and structure, unspecified site: Secondary | ICD-10-CM | POA: Diagnosis not present

## 2014-10-25 DIAGNOSIS — C50912 Malignant neoplasm of unspecified site of left female breast: Secondary | ICD-10-CM | POA: Diagnosis not present

## 2014-10-25 DIAGNOSIS — C50919 Malignant neoplasm of unspecified site of unspecified female breast: Secondary | ICD-10-CM

## 2014-10-25 DIAGNOSIS — I10 Essential (primary) hypertension: Secondary | ICD-10-CM | POA: Diagnosis not present

## 2014-10-25 DIAGNOSIS — Z5111 Encounter for antineoplastic chemotherapy: Secondary | ICD-10-CM | POA: Diagnosis not present

## 2014-10-25 LAB — CBC WITH DIFFERENTIAL/PLATELET
BASO%: 1 % (ref 0.0–2.0)
Basophils Absolute: 0 10*3/uL (ref 0.0–0.1)
EOS ABS: 0.1 10*3/uL (ref 0.0–0.5)
EOS%: 2.6 % (ref 0.0–7.0)
HCT: 44.2 % (ref 34.8–46.6)
HEMOGLOBIN: 14.9 g/dL (ref 11.6–15.9)
LYMPH#: 1.4 10*3/uL (ref 0.9–3.3)
LYMPH%: 27.6 % (ref 14.0–49.7)
MCH: 28.3 pg (ref 25.1–34.0)
MCHC: 33.7 g/dL (ref 31.5–36.0)
MCV: 84.1 fL (ref 79.5–101.0)
MONO#: 0.4 10*3/uL (ref 0.1–0.9)
MONO%: 7.9 % (ref 0.0–14.0)
NEUT%: 60.9 % (ref 38.4–76.8)
NEUTROS ABS: 3 10*3/uL (ref 1.5–6.5)
Platelets: 224 10*3/uL (ref 145–400)
RBC: 5.26 10*6/uL (ref 3.70–5.45)
RDW: 14.1 % (ref 11.2–14.5)
WBC: 5 10*3/uL (ref 3.9–10.3)

## 2014-10-25 LAB — COMPREHENSIVE METABOLIC PANEL (CC13)
ALBUMIN: 3.9 g/dL (ref 3.5–5.0)
ALK PHOS: 80 U/L (ref 40–150)
ALT: 15 U/L (ref 0–55)
AST: 19 U/L (ref 5–34)
Anion Gap: 10 mEq/L (ref 3–11)
BILIRUBIN TOTAL: 1.26 mg/dL — AB (ref 0.20–1.20)
BUN: 13.3 mg/dL (ref 7.0–26.0)
CO2: 25 mEq/L (ref 22–29)
CREATININE: 0.8 mg/dL (ref 0.6–1.1)
Calcium: 8.9 mg/dL (ref 8.4–10.4)
Chloride: 103 mEq/L (ref 98–109)
EGFR: 88 mL/min/{1.73_m2} — AB (ref >90)
Glucose: 103 mg/dl (ref 70–140)
Potassium: 3.5 mEq/L (ref 3.5–5.1)
Sodium: 139 mEq/L (ref 136–145)
Total Protein: 6.8 g/dL (ref 6.4–8.3)

## 2014-10-25 LAB — FOLLICLE STIMULATING HORMONE: FSH: 6.6 m[IU]/mL

## 2014-10-25 MED ORDER — GOSERELIN ACETATE 3.6 MG ~~LOC~~ IMPL
3.6000 mg | DRUG_IMPLANT | Freq: Once | SUBCUTANEOUS | Status: AC
  Administered 2014-10-25: 3.6 mg via SUBCUTANEOUS
  Filled 2014-10-25: qty 3.6

## 2014-10-25 MED ORDER — LOSARTAN POTASSIUM 50 MG PO TABS: 50.0000 mg | ORAL_TABLET | Freq: Every day | ORAL | Status: DC

## 2014-10-25 MED ORDER — DENOSUMAB 120 MG/1.7ML ~~LOC~~ SOLN
120.0000 mg | Freq: Once | SUBCUTANEOUS | Status: AC
  Administered 2014-10-25: 120 mg via SUBCUTANEOUS
  Filled 2014-10-25: qty 1.7

## 2014-10-25 NOTE — Progress Notes (Signed)
Call pt - I suggest she increase her losartan to 100 mg (ok to call into her pharm), then appt in 2 weeks.  She should continue to monitor her BP at home

## 2014-10-25 NOTE — Telephone Encounter (Signed)
Appointments made and avs printed for patients °

## 2014-10-25 NOTE — Patient Instructions (Signed)
Denosumab injection What is this medicine? DENOSUMAB (den oh sue mab) slows bone breakdown. Prolia is used to treat osteoporosis in women after menopause and in men. Xgeva is used to prevent bone fractures and other bone problems caused by cancer bone metastases. Xgeva is also used to treat giant cell tumor of the bone. This medicine may be used for other purposes; ask your health care provider or pharmacist if you have questions. COMMON BRAND NAME(S): Prolia, XGEVA What should I tell my health care provider before I take this medicine? They need to know if you have any of these conditions: -dental disease -eczema -infection or history of infections -kidney disease or on dialysis -low blood calcium or vitamin D -malabsorption syndrome -scheduled to have surgery or tooth extraction -taking medicine that contains denosumab -thyroid or parathyroid disease -an unusual reaction to denosumab, other medicines, foods, dyes, or preservatives -pregnant or trying to get pregnant -breast-feeding How should I use this medicine? This medicine is for injection under the skin. It is given by a health care professional in a hospital or clinic setting. If you are getting Prolia, a special MedGuide will be given to you by the pharmacist with each prescription and refill. Be sure to read this information carefully each time. For Prolia, talk to your pediatrician regarding the use of this medicine in children. Special care may be needed. For Xgeva, talk to your pediatrician regarding the use of this medicine in children. While this drug may be prescribed for children as young as 13 years for selected conditions, precautions do apply. Overdosage: If you think you've taken too much of this medicine contact a poison control center or emergency room at once. Overdosage: If you think you have taken too much of this medicine contact a poison control center or emergency room at once. NOTE: This medicine is only for  you. Do not share this medicine with others. What if I miss a dose? It is important not to miss your dose. Call your doctor or health care professional if you are unable to keep an appointment. What may interact with this medicine? Do not take this medicine with any of the following medications: -other medicines containing denosumab This medicine may also interact with the following medications: -medicines that suppress the immune system -medicines that treat cancer -steroid medicines like prednisone or cortisone This list may not describe all possible interactions. Give your health care provider a list of all the medicines, herbs, non-prescription drugs, or dietary supplements you use. Also tell them if you smoke, drink alcohol, or use illegal drugs. Some items may interact with your medicine. What should I watch for while using this medicine? Visit your doctor or health care professional for regular checks on your progress. Your doctor or health care professional may order blood tests and other tests to see how you are doing. Call your doctor or health care professional if you get a cold or other infection while receiving this medicine. Do not treat yourself. This medicine may decrease your body's ability to fight infection. You should make sure you get enough calcium and vitamin D while you are taking this medicine, unless your doctor tells you not to. Discuss the foods you eat and the vitamins you take with your health care professional. See your dentist regularly. Brush and floss your teeth as directed. Before you have any dental work done, tell your dentist you are receiving this medicine. Do not become pregnant while taking this medicine or for 5 months after stopping   it. Women should inform their doctor if they wish to become pregnant or think they might be pregnant. There is a potential for serious side effects to an unborn child. Talk to your health care professional or pharmacist for more  information. What side effects may I notice from receiving this medicine? Side effects that you should report to your doctor or health care professional as soon as possible: -allergic reactions like skin rash, itching or hives, swelling of the face, lips, or tongue -breathing problems -chest pain -fast, irregular heartbeat -feeling faint or lightheaded, falls -fever, chills, or any other sign of infection -muscle spasms, tightening, or twitches -numbness or tingling -skin blisters or bumps, or is dry, peels, or red -slow healing or unexplained pain in the mouth or jaw -unusual bleeding or bruising Side effects that usually do not require medical attention (Report these to your doctor or health care professional if they continue or are bothersome.): -muscle pain -stomach upset, gas This list may not describe all possible side effects. Call your doctor for medical advice about side effects. You may report side effects to FDA at 1-800-FDA-1088. Where should I keep my medicine? This medicine is only given in a clinic, doctor's office, or other health care setting and will not be stored at home. NOTE: This sheet is a summary. It may not cover all possible information. If you have questions about this medicine, talk to your doctor, pharmacist, or health care provider.  2015, Elsevier/Gold Standard. (2011-10-07 12:37:47) Goserelin injection What is this medicine? GOSERELIN (GOE se rel in) is similar to a hormone found in the body. It lowers the amount of sex hormones that the body makes. Men will have lower testosterone levels and women will have lower estrogen levels while taking this medicine. In men, this medicine is used to treat prostate cancer; the injection is either given once per month or once every 12 weeks. A once per month injection (only) is used to treat women with endometriosis, dysfunctional uterine bleeding, or advanced breast cancer. This medicine may be used for other purposes;  ask your health care provider or pharmacist if you have questions. COMMON BRAND NAME(S): Zoladex What should I tell my health care provider before I take this medicine? They need to know if you have any of these conditions (some only apply to women): -diabetes -heart disease or previous heart attack -high blood pressure -high cholesterol -kidney disease -osteoporosis or low bone density -problems passing urine -spinal cord injury -stroke -tobacco smoker -an unusual or allergic reaction to goserelin, hormone therapy, other medicines, foods, dyes, or preservatives -pregnant or trying to get pregnant -breast-feeding How should I use this medicine? This medicine is for injection under the skin. It is given by a health care professional in a hospital or clinic setting. Men receive this injection once every 4 weeks or once every 12 weeks. Women will only receive the once every 4 weeks injection. Talk to your pediatrician regarding the use of this medicine in children. Special care may be needed. Overdosage: If you think you have taken too much of this medicine contact a poison control center or emergency room at once. NOTE: This medicine is only for you. Do not share this medicine with others. What if I miss a dose? It is important not to miss your dose. Call your doctor or health care professional if you are unable to keep an appointment. What may interact with this medicine? -female hormones like estrogen -herbal or dietary supplements like black cohosh, chasteberry,   or DHEA -female hormones like testosterone -prasterone This list may not describe all possible interactions. Give your health care provider a list of all the medicines, herbs, non-prescription drugs, or dietary supplements you use. Also tell them if you smoke, drink alcohol, or use illegal drugs. Some items may interact with your medicine. What should I watch for while using this medicine? Visit your doctor or health care  professional for regular checks on your progress. Your symptoms may appear to get worse during the first weeks of this therapy. Tell your doctor or healthcare professional if your symptoms do not start to get better or if they get worse after this time. Your bones may get weaker if you take this medicine for a long time. If you smoke or frequently drink alcohol you may increase your risk of bone loss. A family history of osteoporosis, chronic use of drugs for seizures (convulsions), or corticosteroids can also increase your risk of bone loss. Talk to your doctor about how to keep your bones strong. This medicine should stop regular monthly menstration in women. Tell your doctor if you continue to menstrate. Women should not become pregnant while taking this medicine or for 12 weeks after stopping this medicine. Women should inform their doctor if they wish to become pregnant or think they might be pregnant. There is a potential for serious side effects to an unborn child. Talk to your health care professional or pharmacist for more information. Do not breast-feed an infant while taking this medicine. Men should inform their doctors if they wish to father a child. This medicine may lower sperm counts. Talk to your health care professional or pharmacist for more information. What side effects may I notice from receiving this medicine? Side effects that you should report to your doctor or health care professional as soon as possible: -allergic reactions like skin rash, itching or hives, swelling of the face, lips, or tongue -bone pain -breathing problems -changes in vision -chest pain -feeling faint or lightheaded, falls -fever, chills -pain, swelling, warmth in the leg -pain, tingling, numbness in the hands or feet -signs and symptoms of low blood pressure like dizziness; feeling faint or lightheaded, falls; unusually weak or tired -stomach pain -swelling of the ankles, feet, hands -trouble passing  urine or change in the amount of urine -unusually high or low blood pressure -unusually weak or tired Side effects that usually do not require medical attention (report to your doctor or health care professional if they continue or are bothersome): -change in sex drive or performance -changes in breast size in both males and females -changes in emotions or moods -headache -hot flashes -irritation at site where injected -loss of appetite -skin problems like acne, dry skin -vaginal dryness This list may not describe all possible side effects. Call your doctor for medical advice about side effects. You may report side effects to FDA at 1-800-FDA-1088. Where should I keep my medicine? This drug is given in a hospital or clinic and will not be stored at home. NOTE: This sheet is a summary. It may not cover all possible information. If you have questions about this medicine, talk to your doctor, pharmacist, or health care provider.  2015, Elsevier/Gold Standard. (2013-06-15 11:10:35)  

## 2014-10-29 LAB — ESTRADIOL, ULTRA SENS: ESTRADIOL, ULTRA SENSITIVE: 24 pg/mL

## 2014-11-01 ENCOUNTER — Ambulatory Visit: Payer: BC Managed Care – PPO | Admitting: Oncology

## 2014-11-08 ENCOUNTER — Ambulatory Visit: Payer: BC Managed Care – PPO | Admitting: Oncology

## 2014-11-08 ENCOUNTER — Ambulatory Visit: Payer: BC Managed Care – PPO

## 2014-11-23 ENCOUNTER — Ambulatory Visit (HOSPITAL_BASED_OUTPATIENT_CLINIC_OR_DEPARTMENT_OTHER): Payer: BC Managed Care – PPO

## 2014-11-23 ENCOUNTER — Other Ambulatory Visit (HOSPITAL_BASED_OUTPATIENT_CLINIC_OR_DEPARTMENT_OTHER): Payer: BC Managed Care – PPO

## 2014-11-23 VITALS — BP 141/81 | HR 74 | Temp 98.0°F

## 2014-11-23 DIAGNOSIS — C50412 Malignant neoplasm of upper-outer quadrant of left female breast: Secondary | ICD-10-CM | POA: Diagnosis not present

## 2014-11-23 DIAGNOSIS — C50919 Malignant neoplasm of unspecified site of unspecified female breast: Secondary | ICD-10-CM

## 2014-11-23 DIAGNOSIS — C50912 Malignant neoplasm of unspecified site of left female breast: Secondary | ICD-10-CM

## 2014-11-23 DIAGNOSIS — Z5111 Encounter for antineoplastic chemotherapy: Secondary | ICD-10-CM | POA: Diagnosis not present

## 2014-11-23 DIAGNOSIS — M8580 Other specified disorders of bone density and structure, unspecified site: Secondary | ICD-10-CM | POA: Diagnosis not present

## 2014-11-23 DIAGNOSIS — N8500 Endometrial hyperplasia, unspecified: Secondary | ICD-10-CM

## 2014-11-23 LAB — CBC WITH DIFFERENTIAL/PLATELET
BASO%: 0.8 % (ref 0.0–2.0)
BASOS ABS: 0 10*3/uL (ref 0.0–0.1)
EOS%: 2 % (ref 0.0–7.0)
Eosinophils Absolute: 0.1 10*3/uL (ref 0.0–0.5)
HEMATOCRIT: 43 % (ref 34.8–46.6)
HEMOGLOBIN: 14.3 g/dL (ref 11.6–15.9)
LYMPH%: 25.5 % (ref 14.0–49.7)
MCH: 28.4 pg (ref 25.1–34.0)
MCHC: 33.2 g/dL (ref 31.5–36.0)
MCV: 85.5 fL (ref 79.5–101.0)
MONO#: 0.5 10*3/uL (ref 0.1–0.9)
MONO%: 8.3 % (ref 0.0–14.0)
NEUT%: 63.4 % (ref 38.4–76.8)
NEUTROS ABS: 3.8 10*3/uL (ref 1.5–6.5)
Platelets: 225 10*3/uL (ref 145–400)
RBC: 5.03 10*6/uL (ref 3.70–5.45)
RDW: 13.5 % (ref 11.2–14.5)
WBC: 5.9 10*3/uL (ref 3.9–10.3)
lymph#: 1.5 10*3/uL (ref 0.9–3.3)

## 2014-11-23 LAB — COMPREHENSIVE METABOLIC PANEL (CC13)
ALK PHOS: 74 U/L (ref 40–150)
ALT: 14 U/L (ref 0–55)
AST: 17 U/L (ref 5–34)
Albumin: 3.7 g/dL (ref 3.5–5.0)
Anion Gap: 4 mEq/L (ref 3–11)
BUN: 13.4 mg/dL (ref 7.0–26.0)
CO2: 29 mEq/L (ref 22–29)
Calcium: 9.2 mg/dL (ref 8.4–10.4)
Chloride: 105 mEq/L (ref 98–109)
Creatinine: 0.8 mg/dL (ref 0.6–1.1)
EGFR: 85 mL/min/{1.73_m2} — ABNORMAL LOW (ref >90)
Glucose: 90 mg/dl (ref 70–140)
Potassium: 4 mEq/L (ref 3.5–5.1)
SODIUM: 139 meq/L (ref 136–145)
Total Bilirubin: 1.08 mg/dL (ref 0.20–1.20)
Total Protein: 6.5 g/dL (ref 6.4–8.3)

## 2014-11-23 MED ORDER — GOSERELIN ACETATE 3.6 MG ~~LOC~~ IMPL
3.6000 mg | DRUG_IMPLANT | Freq: Once | SUBCUTANEOUS | Status: AC
  Administered 2014-11-23: 3.6 mg via SUBCUTANEOUS
  Filled 2014-11-23: qty 3.6

## 2014-11-23 MED ORDER — DENOSUMAB 120 MG/1.7ML ~~LOC~~ SOLN
120.0000 mg | Freq: Once | SUBCUTANEOUS | Status: AC
  Administered 2014-11-23: 120 mg via SUBCUTANEOUS
  Filled 2014-11-23: qty 1.7

## 2014-12-20 ENCOUNTER — Ambulatory Visit (HOSPITAL_COMMUNITY)
Admission: RE | Admit: 2014-12-20 | Discharge: 2014-12-20 | Disposition: A | Discharge status: Home / Self Care | Payer: BC Managed Care – PPO | Source: Ambulatory Visit | Attending: Oncology | Admitting: Oncology

## 2014-12-20 ENCOUNTER — Encounter (HOSPITAL_COMMUNITY)
Admission: RE | Admit: 2014-12-20 | Discharge: 2014-12-20 | Disposition: A | Discharge status: Home / Self Care | Payer: BC Managed Care – PPO | Source: Ambulatory Visit | Attending: Oncology | Admitting: Oncology

## 2014-12-20 DIAGNOSIS — M8580 Other specified disorders of bone density and structure, unspecified site: Secondary | ICD-10-CM

## 2014-12-20 DIAGNOSIS — C50412 Malignant neoplasm of upper-outer quadrant of left female breast: Secondary | ICD-10-CM | POA: Diagnosis present

## 2014-12-20 MED ORDER — TECHNETIUM TC 99M MEDRONATE IV KIT
26.8000 | PACK | Freq: Once | INTRAVENOUS | Status: AC | PRN
  Administered 2014-12-20: 26.8 via INTRAVENOUS

## 2014-12-21 ENCOUNTER — Other Ambulatory Visit: Payer: Self-pay | Admitting: Oncology

## 2014-12-23 ENCOUNTER — Ambulatory Visit (HOSPITAL_BASED_OUTPATIENT_CLINIC_OR_DEPARTMENT_OTHER): Payer: BC Managed Care – PPO

## 2014-12-23 ENCOUNTER — Other Ambulatory Visit (HOSPITAL_BASED_OUTPATIENT_CLINIC_OR_DEPARTMENT_OTHER): Payer: BC Managed Care – PPO

## 2014-12-23 ENCOUNTER — Telehealth: Payer: Self-pay | Admitting: Oncology

## 2014-12-23 ENCOUNTER — Ambulatory Visit (HOSPITAL_BASED_OUTPATIENT_CLINIC_OR_DEPARTMENT_OTHER): Payer: BC Managed Care – PPO | Admitting: Oncology

## 2014-12-23 VITALS — BP 133/71 | HR 79 | Temp 97.8°F | Resp 18 | Ht 65.0 in | Wt 158.2 lb

## 2014-12-23 DIAGNOSIS — Z5111 Encounter for antineoplastic chemotherapy: Secondary | ICD-10-CM

## 2014-12-23 DIAGNOSIS — C50912 Malignant neoplasm of unspecified site of left female breast: Secondary | ICD-10-CM

## 2014-12-23 DIAGNOSIS — C50412 Malignant neoplasm of upper-outer quadrant of left female breast: Secondary | ICD-10-CM | POA: Diagnosis not present

## 2014-12-23 DIAGNOSIS — M8580 Other specified disorders of bone density and structure, unspecified site: Secondary | ICD-10-CM

## 2014-12-23 DIAGNOSIS — N8500 Endometrial hyperplasia, unspecified: Secondary | ICD-10-CM

## 2014-12-23 DIAGNOSIS — C50919 Malignant neoplasm of unspecified site of unspecified female breast: Secondary | ICD-10-CM

## 2014-12-23 LAB — COMPREHENSIVE METABOLIC PANEL (CC13)
ALK PHOS: 66 U/L (ref 40–150)
ALT: 13 U/L (ref 0–55)
ANION GAP: 7 meq/L (ref 3–11)
AST: 17 U/L (ref 5–34)
Albumin: 3.8 g/dL (ref 3.5–5.0)
BILIRUBIN TOTAL: 1.62 mg/dL — AB (ref 0.20–1.20)
BUN: 12.9 mg/dL (ref 7.0–26.0)
CALCIUM: 8.5 mg/dL (ref 8.4–10.4)
CO2: 26 meq/L (ref 22–29)
Chloride: 106 mEq/L (ref 98–109)
Creatinine: 0.7 mg/dL (ref 0.6–1.1)
Glucose: 118 mg/dl (ref 70–140)
Potassium: 4.2 mEq/L (ref 3.5–5.1)
Sodium: 139 mEq/L (ref 136–145)
TOTAL PROTEIN: 6.6 g/dL (ref 6.4–8.3)

## 2014-12-23 LAB — CBC WITH DIFFERENTIAL/PLATELET
BASO%: 0.8 % (ref 0.0–2.0)
Basophils Absolute: 0 10*3/uL (ref 0.0–0.1)
EOS ABS: 0.1 10*3/uL (ref 0.0–0.5)
EOS%: 2.3 % (ref 0.0–7.0)
HCT: 42.7 % (ref 34.8–46.6)
HEMOGLOBIN: 14.8 g/dL (ref 11.6–15.9)
LYMPH%: 28.8 % (ref 14.0–49.7)
MCH: 29.1 pg (ref 25.1–34.0)
MCHC: 34.7 g/dL (ref 31.5–36.0)
MCV: 84.1 fL (ref 79.5–101.0)
MONO#: 0.4 10*3/uL (ref 0.1–0.9)
MONO%: 9.1 % (ref 0.0–14.0)
NEUT%: 59 % (ref 38.4–76.8)
NEUTROS ABS: 2.9 10*3/uL (ref 1.5–6.5)
PLATELETS: 175 10*3/uL (ref 145–400)
RBC: 5.08 10*6/uL (ref 3.70–5.45)
RDW: 12.8 % (ref 11.2–14.5)
WBC: 4.8 10*3/uL (ref 3.9–10.3)
lymph#: 1.4 10*3/uL (ref 0.9–3.3)

## 2014-12-23 MED ORDER — LOSARTAN POTASSIUM 100 MG PO TABS: 100.0000 mg | ORAL_TABLET | Freq: Every day | ORAL | Status: DC

## 2014-12-23 MED ORDER — DENOSUMAB 120 MG/1.7ML ~~LOC~~ SOLN
120.0000 mg | Freq: Once | SUBCUTANEOUS | Status: AC
  Administered 2014-12-23: 120 mg via SUBCUTANEOUS
  Filled 2014-12-23: qty 1.7

## 2014-12-23 MED ORDER — GOSERELIN ACETATE 3.6 MG ~~LOC~~ IMPL
3.6000 mg | DRUG_IMPLANT | Freq: Once | SUBCUTANEOUS | Status: AC
  Administered 2014-12-23: 3.6 mg via SUBCUTANEOUS
  Filled 2014-12-23: qty 3.6

## 2014-12-23 NOTE — Telephone Encounter (Signed)
Gave avs & calendar for September thru February

## 2014-12-23 NOTE — Progress Notes (Signed)
Erin [name]  MR#: [id number]    PCP: Drema Pry, DO GYN: Thomes Cake SU: Rolm Bookbinder OTHER MD: Crista Luria, Ulice Dash Pyrt[place], [place] Vernia Buff  CHIEF COMPLAINT: Recurrent breast cancer  CURRENT THERAPY: Goserelin, anastrozole; denosumab    BREAST CANCER HISTORY From the original intake note 04/07/2011:  Erin [name] had routine screening mammography December of 2009 at the [facility] in [place], [place], showing an increasing asymmetry in her left breast. She was recalled for additional views December 29. She was noted to have heterogeneously dense breasts. It was a 5 cm area of architectural distortion in the lateral aspect of the left breast with no associated calcifications. There was a second lesion medial to this. Ultrasound showed a highly suspicious hypoechoic irregularly marginated mass measuring 3 cm at the 2:30 position 5 cm from the nipple. This was palpable to the mammographer. The second area in question I measured 7 mm and a third lesion was noted measuring 5 mm. Some left axillary lymph nodes were morphologically normal.  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 Bilateral breast MRIs were performed 05/04/2008 and showed, in the left breast, a large lobulated mass measuring up to 4.3 cm, and including both of the apparently separate masses the previously biopsied. In the right breast there were 2 indeterminate lesions. These were evaluated further with breast specific gamma imaging performed January 14 in both lesions were negative. The lesion in the left breast was markedly abnormal.  Given this complex history and with the background of significant breast density, the patient opted for bilateral mastectomies with left sentinel lymph node sampling. This was performed of 06/27/2008 and showed([id number]. [id number])  in the right breast, no malignancy. In the left breast there was a 3.2 cm invasive ductal carcinoma, grade 1, focally extending to the anterior margin of the lower outer quadrant. There was extensive angiolymphatic invasion, but both sentinel lymph nodes on the left were negative.  The patient received left-sided postmastectomy radiation including of the left chest wall and left supraclavicular fossa to a total dose of 50.4 Gy plus a 10 Gy scar boost. She had an Oncotype DX recurrence score of 17, further discussed below. She decided to forego reconstruction. She was tested for BRCA1 and 2 and was found to be negative. Given her overall prognosis, she did not receive chemotherapy, but started tamoxifen February of 2010, with good tolerance.  RECURRENT DISEASE: INITIAL SUMMARY Erin [name] took tamoxifen for 5 years with no evidence of active disease area did in May 2015 we decided to stop the tamoxifen because of problems with endometrial hyperplasia. 6 months later, at the November visit, she was found to have a change in a small area of scaring her left inframammary fold. She was referred to dermatology and biopsy 04/21/2014 showed ([id number]) recurrent ductal adenocarcinoma (positive for gross cystic disease fluid protein, estrogen, and progesterone). She then underwent a staging PET scan in [facility] 05/06/2014. This showed a 1.9 cm sclerotic lesion in the right ischial tuberosity, with a maximum standard uptake value of 4.6. A sclerotic lesion in the left side of the L3 vertebral body measuring 1.6 cm had no hypermetabolic activity.  Her subsequent history is as detailed below.  INTERVAL HISTORY: Erin [name] returns today for follow-up of her recurrent breast cancer. She just had a restaging bone scan which shows no other bone lesions. This  is very favorable. The lesion that we have been focusing on looks dimmer, consistent with response to treatment.  She has no side effects from the denosumab  that she is aware of. She takes Claritin and Tums around those doses. She is tolerating the goserelin well. Of course it is uncomfortable to receive that injection every month. The estradiol level went up a little bit last time we checked it. There is some variability in the lab work on this test, which is unreliable--when it is followed serially about 1 in every 10 levels will be up for unexplained reasons. In the absence of a trend this does not mandate any change in therapy. However it does remind is that it would be easier for her if she simply underwent bilateral salpingo-oophorectomy.  REVIEWOF SYSTEMS: Respiratory has been checking her blood pressure every few days. It is generally in the 135/85 range, but occasionally the diastolic was over 90 or the systolic over 086. There is room for improvement. She has minimal peripheral neuropathy symptoms. She does have hot flashes both during the day and sometimes at night as well. She just saw Dr. Tonia Brooms and had a lesion removed from her right shin area. We don't have the path yet. I reassured her this is not going to be breast cancer! She is exercising regularly mostly using a treadmill. Otherwise a detailed review of systems today was stable  Past Medical History  Diagnosis Date  . Hypertension   . Allergic rhinitis due to pollen   . Osteopenia   . Hypothyroidism   . Breast cancer 2010  . Wears glasses   . S/P radiation therapy 07/12/2014 through 08/16/2014     Left chest wall 4000 cGy in 20 sessions, left chest wall boost 1000 cGy in 5 sessions  Diverticulosis       Past Surgical History  Procedure Laterality Date  . Tonsillectomy    . Right knee arthroscopy  2008  . Inguinal hernia repair      right side as infant  . Wisdom tooth extraction    . Cyst removed      left lower jaw - at age 67 yr  . Dilatation & currettage/hysteroscopy with resectocope N/A 09/16/2013    Procedure:  DILATATION & CURETTAGE/HYSTEROSCOPY WITH RESECTOCOPE;  Surgeon: Princess Bruins, MD;  Location: [facility];  Service: Gynecology;  Laterality: N/A;  1 hr.  . Mastectomy  2010    bilateral mastectomies-lt snbx-radiation post  . Colonoscopy    . Breast lumpectomy Left 06/01/2014    Procedure: WIDE LOCAL EXCISION OF RECURRENT LEFT BREAST CANCER;  Surgeon: Rolm Bookbinder, MD;  Location: [facility];  Service: General;  Laterality: Left;    FAMILY HISTORY The patient's mother died in 5784 from complications of lung cancer. The patient has not been in touch with her father her for approximately 30 years. She had no sisters, 1 brother, who is in good health. There is no breast or ovarian cancer in the family to her knowledge.  GYNECOLOGIC HISTORY:  GX P0, menarche at around age 55, the patient continued to have periods despite being on tamoxifen, although more irregularly. 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     ADVANCED DIRECTIVES: not in place  HEALTH MAINTENANCE:      Social History  Substance Use Topics  . Smoking status: Never Smoker   . Smokeless tobacco: Never Used  . Alcohol Use: 0.0  oz/week    0 Standard drinks or equivalent per week     Comment: rare      Colonoscopy: December 2013 [facility]ares)  PAP: Feb 2011  Bone density: 06/2009, normal  Cholesterol: "good"      Allergies  Allergen Reactions  . Pollen Extract   . Adhesive [Tape] Rash    MEDICATIONS:    Current Outpatient Prescriptions  Medication Sig Dispense Refill  . acyclovir ointment (ZOVIRAX) 5 % Apply 1 application topically every 3 (three) hours. As needed 5 g 5  . anastrozole (ARIMIDEX) 1 MG tablet Take 1 tablet (1 mg total) by mouth daily. 90 tablet 4  . calcium carbonate (OS-CAL) 600 MG TABS tablet Take 600 mg by mouth daily with breakfast.     . Cholecalciferol (VITAMIN D3) 2000 UNITS capsule Take 2,000 Units by  mouth daily.      . Denosumab (XGEVA [place]) Inject 120 mg into the skin every 30 (thirty) days.     Marland Kitchen goserelin (ZOLADEX) 10.8 MG injection Inject 10.8 mg into the skin every 30 (thirty) days.     . hyaluronate sodium (RADIAPLEXRX) GEL Apply 1 application topically 2 (two) times daily.    Marland Kitchen levothyroxine (SYNTHROID, LEVOTHROID) 75 MCG tablet Take 1 tablet (75 mcg total) by mouth daily. 90 tablet 3  . loratadine (CLARITIN) 10 MG tablet Take 10 mg by mouth daily as needed for allergies.     Marland Kitchen losartan (COZAAR) 25 MG tablet Take 1 tablet (25 mg total) by mouth daily. 90 tablet 3  . losartan (COZAAR) 50 MG tablet Take 1 tablet (50 mg total) by mouth daily. 90 tablet 4  . Polyethyl Glycol-Propyl Glycol (SYSTANE OP) Apply 1 drop to eye as needed (for allergies).    . sodium chloride (OCEAN) 0.65 % SOLN nasal spray Place 1 spray into both nostrils as needed for congestion.     No current facility-administered medications for this visit.    OBJECTIVE:  Middle-aged white woman who appears well    Filed Vitals:   12/23/14 0806  BP: 133/71  Pulse: 79  Temp: 97.8 F (36.6 C)  Resp: 18     Body mass index is 26.33 kg/(m^2).     ECOG performance status: 0  Sclerae unicteric, EOMs intact Oropharynx clear, dentition in good repair No cervical or supraclavicular adenopathy Lungs no rales or rhonchi Heart regular rate and rhythm Abd soft, nontender, positive bowel sounds MSK no focal spinal tenderness, no upper extremity lymphedema Neuro: nonfocal, well oriented, appropriate affect Breasts: Status post bilateral mastectomies. The right chest wall is unremarkable. On the left side the earlier area of local recurrence is not apparent, status post surgery and radiation. There is no evidence of active disease. The left axilla is benign  LABS:           Chemistry      Component Value Date/Time   NA 139 11/23/2014 1457   NA 136 08/10/2014 1008   K 4.0 11/23/2014 1457   K 4.3 08/10/2014 1008   CL  101 08/10/2014 1008   CL 105 08/10/2012 0944   CO2 29 11/23/2014 1457   CO2 30 08/10/2014 1008   BUN 13.4 11/23/2014 1457   BUN 13 08/10/2014 1008   CREATININE 0.8 11/23/2014 1457   CREATININE 0.76 08/10/2014 1008      Component Value Date/Time   CALCIUM 9.2 11/23/2014 1457   CALCIUM 9.3 08/10/2014 1008   ALKPHOS 74 11/23/2014 1457   ALKPHOS 59 02/08/2014 0834  AST 17 11/23/2014 1457   AST 17 02/08/2014 0834   ALT 14 11/23/2014 1457   ALT 14 02/08/2014 0834   BILITOT 1.08 11/23/2014 1457   BILITOT 0.9 02/08/2014 0834     Results for TOBA, CLAUDIO (MRN [id number]) as of 12/23/2014 09:19  Ref. Range 08/01/2011 15:50 03/02/2014 09:48 08/29/2014 09:08 10/25/2014 08:15  Estradiol, Ultra Sensitive Latest Units: pg/mL 141 153 2 24      Lab Results  Component Value Date   WBC 4.8 12/23/2014   HGB 14.8 12/23/2014   HCT 42.7 12/23/2014   MCV 84.1 12/23/2014   PLT 175 12/23/2014   NEUTROABS 2.9 12/23/2014    STUDIES  Nm Bone Scan Whole Body  12/20/2014   CLINICAL DATA:  Breast malignancy, evaluate bony metastatic disease  EXAM: NUCLEAR MEDICINE WHOLE BODY BONE SCAN  TECHNIQUE: Whole body anterior and posterior images were obtained approximately 3 hours after intravenous injection of radiopharmaceutical.  RADIOPHARMACEUTICALS:  26.8 mCi Technetium-66mMDP IV  COMPARISON:  Nuclear bone scan of May 20, 2014  FINDINGS: There is adequate uptake of the radiopharmaceutical by the skeleton. Adequate soft tissue clearance and renal activity is demonstrated.  The previously noted area of increased uptake in the inferior aspect of the right ischium is less conspicuous today. No new abnormal uptake in the pelvis is observed.  Activity within the calvarium, spine, ribs, pectoral girdle, and lower extremities is within the limits of normal.  IMPRESSION: 1. Decreased conspicuity of increased uptake in the inferior aspect of the right ischium posteriorly. No new abnormal uptake within the pelvis is  demonstrated. 2. Elsewhere there are no areas of abnormal uptake to suggest metastatic disease.   Electronically Signed   By: David  JMartiniqueM.D.   On: 12/20/2014 12:23     ASSESSMENT: 54y.o.  BRCA negative Erin [name] woman ,  (1) status post bilateral mastectomies January 2010 for a left-sided T2 N0 (stage IIA) invasive ductal carcinoma, grade 1, strongly estrogen and progesterone receptor positive, HER-2 nonamplified,   (2) Oncotype DX recurrence score of 17, predicting a 10-12% distant recurrence risk over 10 years if her only adjuvant treatment is tamoxifen for 5 years  (3) status post Left postmastectomy radiation including Left supraclavicular fossa (5040 cGy in 28 sessions)  (4) on tamoxifen starting February of 2010, stopped May 2015 because of endometrial hyperplasia  (5) osteopenia-- T score -1.2 on repeated bone density scans 09/24/2011 and 12/12/2013  RECURRENT DISEASE (6) skin biopsy of a lesion under the left breast scar 04/21/2014 shows adenocarcinoma, gross cystic disease fluid protein, estrogen and progesterone receptor positive  (a) s/p wide excision with negative though close margins 06/01/2014, prognostic panel pending  (b) radiation to Left chest wall 4000 cGy in 20 sessions, left schest wall boost 1000 cGy in 5 session 07/12/2014 through 08/16/2014  (7) PET scan and bone scan show only one additional area of concern, in the right ischiial tuberosity--  (a) biopsy 05/24/2014 showed no evidence of malignancy  (b) zolendronate started 06/06/2014, poorly tolerated  (c) denosumab/ Xgeva started 09/26/2014  (d) repeat bone scan 12/20/2014 shows no new areas of disease, and the previously biopsied area to be December  (8) goserelin monthly started 05/09/2016, consider eventual BSO  (9) anastrozole started  08/29/2014  (10) considering palbociclib   PLAN: I am delighted with the results of Erin [name]'s bone scan and we really do need to rethink her case. Recall  that we do not have pathologic proof that the right is she'll lesion was metastatic.  The biopsy was negative. However we also had no other explanation. Now that we have a negative bone scan after receiving denosumab--which should have uncovered other bone spots if there were any--we have to rethink the case as if she were stage III instead of stage IV.  The other way to think about this is that if she were stage IV at this point we would proceed to radiation of the single all ago metastatic deposit in the bone. The negatives regarding doing this are few. The positives, if it turns out that that really wise metastatic, are significant. For that reason after much discussion she and I decided it would be appropriate to do that and she has been scheduled to meet with Dr. Valere Dross next week to discuss that further and 2 operationalized that, assuming he agrees with this plan (unfortunately he was not available today).  She is dealing well with her early menopause and there is no plan to go off the goserelin. Accordingly I think it would be a good idea for her to undergo bilateral salpingo-oophorectomy--there is no need for hysterectomy. She is going to discuss this with Dr. Dellis Filbert, her gynecologist, and hopes to have that performed sometime in November.  Today we also discussed venlafaxine and gabapentin for hot flashes, but Erin [name] does not one to add any further medications at this point. She does have some peripheral neuropathy symptoms in the gabapentin would help that as well, but again she prefers to continue observation. Finally her blood pressure has not yet become as controlled as I would like. She has tolerated the current dose of losartan without any side effects. We are increasing that to 100 mg daily as of today  We are continuing the denosumab monthly through the rest of this year. She will have a repeat PET scan at the end of December and she will see me the first week in January. If all is negative  at that time we are going to move the denosumab to every 2 months for 6 months and then every 3 months for the next 6 months after which we will do it twice a year for a year or 2.  My hope is that by next year Erin [name] will be on a very stable regimen that she can tolerate well and can obtain some degree of long-term normalcy. This is her hope as well as.   Chauncey Cruel, MD  12/23/2014

## 2014-12-27 NOTE — Progress Notes (Signed)
She then underwent a staging PET scan in [facility] 05/06/2014. This showed a 1.9 cm sclerotic lesion in the right ischial tuberosity, with a maximum standard uptake value of 4.6. A sclerotic lesion in the left side of the L3 vertebral body measuring 1.6 cm had no hypermetabolic activity.  Heree tdoay to discuss options for treatment at this time.

## 2014-12-29 ENCOUNTER — Encounter: Payer: Self-pay | Admitting: Radiation Oncology

## 2014-12-29 ENCOUNTER — Ambulatory Visit
Admission: RE | Admit: 2014-12-29 | Discharge: 2014-12-29 | Disposition: A | Discharge status: Home / Self Care | Payer: BC Managed Care – PPO | Source: Ambulatory Visit | Attending: Radiation Oncology | Admitting: Radiation Oncology

## 2014-12-29 VITALS — BP 149/81 | HR 87 | Temp 98.3°F | Ht 65.0 in | Wt 158.0 lb

## 2014-12-29 DIAGNOSIS — C50912 Malignant neoplasm of unspecified site of left female breast: Secondary | ICD-10-CM

## 2014-12-29 NOTE — Progress Notes (Signed)
CC: Dr. Gunnar Bulla [name]  Follow-up note: Erin [name] is a most pleasant 54 year old female who is seen today at the request of Dr. [name] of for discussion of possible radiation therapy to her right ischium for possible metastatic disease.  She'll be radiation therapy to her left chest wall almost 5 months ago.  She has been doing well and she has been followed by Dr. [name] who has her on Zoladex and anastrozole in addition to [place].  He repeated her bone scan on 12/20/2014 and this showed decreased uptake along the inferior aspect of the right ischium compared to her bone scan from January 2016.  Her PET scan and [facility] on 10/05/2014 showed a 1.9 cm sclerotic lesion in the right ischial tuberosity with a maximum SUV of 4.6.  A sclerotic metastasis was entertained.  A bone biopsy was negative for malignancy.  Dr. [name] wants to revisit the possibility/idea of proceeding with radiation therapy.  She is not examined today.  Impression: Recurrent invasive ductal carcinoma of the left breast, chest wall recurrence.  We have the question of metastatic disease to her right ischium.  I reviewed her case with Dr. Tammi Klippel who most knowledgeable regarding stereotactic body radiosurgery.  From a technical standpoint, she could certainly be treated.  However, it is quite possible that she may not have metastatic disease, and treatment would be unnecessary with a small but not negligible risk for toxicity including bone fracture/bone pain.  If she indeed had metastatic disease, it appears to be responding to hormone therapy.  I would favor of following her for development of oligo metastatic disease, and treat if and when that develops.  I do not feel that treatment at this time to her right ischium, should she have metastatic disease, would affect her overall survival particularly since she recurred along her chest wall which almost certainly places her at significant future risk for  the  development of metastatic disease to bone.  Therefore, I do recommend close follow-up with perhaps annual bone scans.  I will share my thoughts with Dr. [name].  I told her she could obtain a second opinion at [facility] with Dr. Costella Hatcher (radiosurgery specialist) or one of the breast radiation oncologists.  Plan: As above.  30 minutes was spent face-to-face with the patient, primarily counseling patient.

## 2014-12-30 ENCOUNTER — Telehealth: Payer: Self-pay | Admitting: *Deleted

## 2014-12-30 ENCOUNTER — Other Ambulatory Visit: Payer: Self-pay | Admitting: *Deleted

## 2014-12-30 NOTE — Telephone Encounter (Signed)
Patient states she spoke with Dr Valere Dross, and needs a referral from Dr. [name] to see Dr. Leonel Ramsay at [facility]; also needs scans on DVD from radiology. Pt requesting call back for assistance; callback # [phone].

## 2014-12-30 NOTE — Telephone Encounter (Signed)
PLEASE CALL PT. AT PHONE NUMBER [phone].

## 2014-12-30 NOTE — Telephone Encounter (Signed)
This RN called to Dr Kilpatrick's office at [phone] to obtain an appointment per pt's discussion and recommendation per Dr Valere Dross.  Per Ivin Booty at [facility]- Dr Katherine Roan does not specialize in breast diagnosis ( does more brain issues )- Dr Kendrick Ranch does breast radiosurgery.  Appointment obtained for Tuesday 01/04/2015 at 10am.  Pt will need to bring Path slides and Films studies on CD.  This RN spoke with [name] per above- she states " out of all the days next Tuesday is the only day I have another commitment ".  This RN informed pt she may call to reschedule as this RN had informed Ivin Booty pt may need to do.  Pt will await call regarding picking up slides and disc to take with her to the appointment.  Will request for HIM to request slides and disc for pt pick up.

## 2014-12-31 ENCOUNTER — Other Ambulatory Visit: Payer: Self-pay | Admitting: Oncology

## 2015-01-02 ENCOUNTER — Telehealth: Payer: Self-pay | Admitting: Oncology

## 2015-01-02 NOTE — Telephone Encounter (Signed)
Called vanda she will have cd ready for pick up tomorrow 9/13. Ordered slides to be fedex'ed. Called pt. Left vm.

## 2015-01-02 NOTE — Telephone Encounter (Signed)
Voicemail from patient stating she received a call from [facility] about scans but doesn't know where to pick these up.  Also needs a F/U appointment at [facility] after second opinion obtained.

## 2015-01-05 ENCOUNTER — Telehealth: Payer: Self-pay | Admitting: *Deleted

## 2015-01-05 NOTE — Telephone Encounter (Signed)
Patient would like to speak with Hinda Lenis RN about next appointment with Dr. [name] that they have discussed.

## 2015-01-06 ENCOUNTER — Telehealth: Payer: Self-pay | Admitting: Oncology

## 2015-01-06 ENCOUNTER — Other Ambulatory Visit: Payer: Self-pay | Admitting: *Deleted

## 2015-01-06 NOTE — Telephone Encounter (Signed)
lvm fo rpt regarding to sept appt.....pt ok and aware °

## 2015-01-12 ENCOUNTER — Ambulatory Visit (HOSPITAL_BASED_OUTPATIENT_CLINIC_OR_DEPARTMENT_OTHER): Payer: BC Managed Care – PPO | Admitting: Oncology

## 2015-01-12 VITALS — BP 145/83 | HR 90 | Temp 98.0°F | Resp 20 | Ht 65.0 in | Wt 158.2 lb

## 2015-01-12 DIAGNOSIS — C50412 Malignant neoplasm of upper-outer quadrant of left female breast: Secondary | ICD-10-CM

## 2015-01-12 DIAGNOSIS — C50912 Malignant neoplasm of unspecified site of left female breast: Secondary | ICD-10-CM | POA: Diagnosis not present

## 2015-01-13 ENCOUNTER — Telehealth: Payer: Self-pay | Admitting: Oncology

## 2015-01-13 ENCOUNTER — Other Ambulatory Visit: Payer: Self-pay | Admitting: *Deleted

## 2015-01-13 ENCOUNTER — Telehealth: Payer: Self-pay | Admitting: *Deleted

## 2015-01-13 ENCOUNTER — Ambulatory Visit (INDEPENDENT_AMBULATORY_CARE_PROVIDER_SITE_OTHER): Payer: BC Managed Care – PPO | Admitting: *Deleted

## 2015-01-13 DIAGNOSIS — Z23 Encounter for immunization: Secondary | ICD-10-CM | POA: Diagnosis not present

## 2015-01-13 MED ORDER — GABAPENTIN 100 MG PO CAPS: 100.0000 mg | ORAL_CAPSULE | Freq: Every day | ORAL | Status: DC

## 2015-01-13 NOTE — Progress Notes (Signed)
Erin [name]  MR#: [id number]    PCP: Drema Pry, DO GYN: Thomes Cake SU: Rolm Bookbinder OTHER MD: Crista Luria, [address] [place] Collene Mares, Frankey Shown  CHIEF COMPLAINT: Recurrent breast cancer  CURRENT THERAPY: Goserelin, anastrozole; denosumab    BREAST CANCER HISTORY From the original intake note 04/07/2011:  Erin [name] had routine screening mammography December of 2009 at the [facility] in [place], [place], showing an increasing asymmetry in her left breast. She was recalled for additional views December 29. She was noted to have heterogeneously dense breasts. It was a 5 cm area of architectural distortion in the lateral aspect of the left breast with no associated calcifications. There was a second lesion medial to this. Ultrasound showed a highly suspicious hypoechoic irregularly marginated mass measuring 3 cm at the 2:30 position 5 cm from the nipple. This was palpable to the mammographer. The second area in question I measured 7 mm and a third lesion was noted measuring 5 mm. Some left axillary lymph nodes were morphologically normal.  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 Bilateral breast MRIs were performed 05/04/2008 and showed, in the left breast, a large lobulated mass measuring up to 4.3 cm, and including both of the apparently separate masses the previously biopsied. In the right breast there were 2 indeterminate lesions. These were evaluated further with breast specific gamma imaging performed January 14 in both lesions were negative. The lesion in the left breast was markedly abnormal.  Given this complex history and with the background of significant breast density, the patient opted for bilateral mastectomies with left sentinel lymph node sampling. This was performed of 06/27/2008 and  showed([id number]. [id number]) in the right breast, no malignancy. In the left breast there was a 3.2 cm invasive ductal carcinoma, grade 1, focally extending to the anterior margin of the lower outer quadrant. There was extensive angiolymphatic invasion, but both sentinel lymph nodes on the left were negative.  The patient received left-sided postmastectomy radiation including of the left chest wall and left supraclavicular fossa to a total dose of 50.4 Gy plus a 10 Gy scar boost. She had an Oncotype DX recurrence score of 17, further discussed below. She decided to forego reconstruction. She was tested for BRCA1 and 2 and was found to be negative. Given her overall prognosis, she did not receive chemotherapy, but started tamoxifen February of 2010, with good tolerance.  RECURRENT DISEASE: INITIAL SUMMARY Erin [name] took tamoxifen for 5 years with no evidence of active disease area did in May 2015 we decided to stop the tamoxifen because of problems with endometrial hyperplasia. 6 months later, at the November visit, she was found to have a change in a small area of scaring her left inframammary fold. She was referred to dermatology and biopsy 04/21/2014 showed ([id number]) recurrent ductal adenocarcinoma (positive for gross cystic disease fluid protein, estrogen, and progesterone). She then underwent a staging PET scan in  regional 05/06/2014. This showed a 1.9 cm sclerotic lesion in the right ischial tuberosity, with a maximum standard uptake value of 4.6. A sclerotic lesion in the left side of the L3 vertebral body measuring 1.6 cm had no hypermetabolic activity.  Her subsequent history is as detailed below.  INTERVAL HISTORY: Erin [name] returns today for follow-up of her recurrent breast cancer. After her visit with me she was referred to Dr. Arloa Koh  for consideration of radiation to her ischial lesion. Dr. Valere Dross reviewed the case and in the absence of pathologic confirmation of cancer was reluctant  to proceed. Accordingly we sought a second opinion from Dr. Lamar Blinks at [facility] and she discussed options extensively with the key. Basically Dr. Lamar Blinks discussed either proceeding with stereotactic radiosurgery in the absence of pathologic confirmation of cancer (which Dr. Lamar Blinks did not favor), repeat biopsy, or observation. Erin [name] is here today to discuss those options further.  She continues on monthly goserelin, but is scheduled for bilateral salpingo-oophorectomy November 21 under Dr. Dellis Filbert. Erin [name] is also on anastrozole, with good tolerance. Hot flashes have diminished in frequency although night sweats persist. She is not on gabapentin at present and that is something we can consider. Vaginal dryness is not a problem. She has not developed the arthralgias or myalgias that many patients can develop on that agents. She also continues on denosumab monthly. She is tolerating that much better than she did the initial zolendronate dose.  REVIEWOF SYSTEMS: Her blood pressure is still on the upper normal range, but when she takes it at home it is usually in the 135/70 range. She continues to work full time and has resumed her usual travel schedule. She is exercising regularly. She is concerned that her thyroid function is not being monitored because of her primary care physician's absences. A detailed review of systems today was otherwise stable  Past Medical History  Diagnosis Date  . Hypertension   . Allergic rhinitis due to pollen   . Osteopenia   . Hypothyroidism   . Breast cancer 2010  . Wears glasses   . S/P radiation therapy 07/12/2014 through 08/16/2014     Left chest wall 4000 cGy in 20 sessions, left chest wall boost 1000 cGy in 5 sessions  Diverticulosis       Past Surgical History  Procedure Laterality Date  . Tonsillectomy    . Right knee arthroscopy  2008  . Inguinal hernia repair      right side as infant  . Wisdom tooth  extraction    . Cyst removed      left lower jaw - at age 33 yr  . Dilatation & currettage/hysteroscopy with resectocope N/A 09/16/2013    Procedure: DILATATION & CURETTAGE/HYSTEROSCOPY WITH RESECTOCOPE;  Surgeon: Princess Bruins, MD;  Location: [facility];  Service: Gynecology;  Laterality: N/A;  1 hr.  . Mastectomy  2010    bilateral mastectomies-lt snbx-radiation post  . Colonoscopy    . Breast lumpectomy Left 06/01/2014    Procedure: WIDE LOCAL EXCISION OF RECURRENT LEFT BREAST CANCER;  Surgeon: Rolm Bookbinder, MD;  Location: [facility];  Service: General;  Laterality: Left;    FAMILY HISTORY The patient's mother died in 1610 from complications of lung cancer. The patient has not been in touch with her father her for approximately 30 years. She had no sisters, 1 brother, who is in good health. There is no breast or ovarian cancer in the family to her knowledge.  GYNECOLOGIC HISTORY:  GX P0, menarche at around age 80, the patient continued to have periods despite being on tamoxifen, although more irregularly. 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 Her work involves quite a bit of travel.     ADVANCED DIRECTIVES: not in place  HEALTH MAINTENANCE:      Social History  Substance Use Topics  . Smoking status: Never Smoker   .  Smokeless tobacco: Never Used  . Alcohol Use: 0.0 oz/week    0 Standard drinks or equivalent per week     Comment: rare      Colonoscopy: December 2013 Collene Mares)  PAP: Feb 2011  Bone density: 06/2009, normal  Cholesterol: "good"      Allergies  Allergen Reactions  . Pollen Extract   . Adhesive [Tape] Rash    MEDICATIONS:    Current Outpatient Prescriptions  Medication Sig Dispense Refill  . acyclovir ointment (ZOVIRAX) 5 % Apply 1 application topically every 3 (three) hours. As needed 5 g 5  . anastrozole (ARIMIDEX) 1 MG tablet Take 1 tablet (1 mg total) by  mouth daily. 90 tablet 4  . calcium carbonate (OS-CAL) 600 MG TABS tablet Take 600 mg by mouth daily with breakfast.     . Cholecalciferol (VITAMIN D3) 2000 UNITS capsule Take 2,000 Units by mouth daily.      . Denosumab (XGEVA [place]) Inject 120 mg into the skin every 30 (thirty) days.     Marland Kitchen goserelin (ZOLADEX) 10.8 MG injection Inject 10.8 mg into the skin every 30 (thirty) days.     Marland Kitchen levothyroxine (SYNTHROID, LEVOTHROID) 75 MCG tablet Take 1 tablet (75 mcg total) by mouth daily. 90 tablet 3  . loratadine (CLARITIN) 10 MG tablet Take 10 mg by mouth daily as needed for allergies.     Marland Kitchen losartan (COZAAR) 100 MG tablet Take 1 tablet (100 mg total) by mouth daily. 90 tablet 4  . Polyethyl Glycol-Propyl Glycol (SYSTANE OP) Apply 1 drop to eye as needed (for allergies).    . sodium chloride (OCEAN) 0.65 % SOLN nasal spray Place 1 spray into both nostrils as needed for congestion.     No current facility-administered medications for this visit.    OBJECTIVE:  Middle-aged white woman in no acute distress    Filed Vitals:   01/12/15 1624  BP: 145/83  Pulse: 90  Temp: 98 F (36.7 C)  Resp: 20     Body mass index is 26.33 kg/(m^2).     ECOG performance status: 0  Sclerae unicteric, pupils round and equal Oropharynx clear and moist-- no thrush or other lesions No cervical or supraclavicular adenopathy Lungs no rales or rhonchi Heart regular rate and rhythm Abd soft, nontender, positive bowel sounds MSK no focal spinal tenderness, no upper extremity lymphedema Neuro: nonfocal, well oriented, appropriate affect Breasts: Deferred   LABS:           Chemistry      Component Value Date/Time   NA 139 12/23/2014 0755   NA 136 08/10/2014 1008   K 4.2 12/23/2014 0755   K 4.3 08/10/2014 1008   CL 101 08/10/2014 1008   CL 105 08/10/2012 0944   CO2 26 12/23/2014 0755   CO2 30 08/10/2014 1008   BUN 12.9 12/23/2014 0755   BUN 13 08/10/2014 1008   CREATININE 0.7 12/23/2014 0755   CREATININE  0.76 08/10/2014 1008      Component Value Date/Time   CALCIUM 8.5 12/23/2014 0755   CALCIUM 9.3 08/10/2014 1008   ALKPHOS 66 12/23/2014 0755   ALKPHOS 59 02/08/2014 0834   AST 17 12/23/2014 0755   AST 17 02/08/2014 0834   ALT 13 12/23/2014 0755   ALT 14 02/08/2014 0834   BILITOT 1.62* 12/23/2014 0755   BILITOT 0.9 02/08/2014 0834    Results for JAYLISSA, FELTY (MRN [id number]) as of 01/13/2015 08:23  Ref. Range 08/01/2011 15:50 03/02/2014 09:48 08/29/2014 09:08 10/25/2014 08:15  Estradiol, Ultra Sensitive Latest Units: pg/mL 141 153 2 24       Lab Results  Component Value Date   WBC 4.8 12/23/2014   HGB 14.8 12/23/2014   HCT 42.7 12/23/2014   MCV 84.1 12/23/2014   PLT 175 12/23/2014   NEUTROABS 2.9 12/23/2014    STUDIES  Nm Bone Scan Whole Body  12/20/2014   CLINICAL DATA:  Breast malignancy, evaluate bony metastatic disease  EXAM: NUCLEAR MEDICINE WHOLE BODY BONE SCAN  TECHNIQUE: Whole body anterior and posterior images were obtained approximately 3 hours after intravenous injection of radiopharmaceutical.  RADIOPHARMACEUTICALS:  26.8 mCi Technetium-38m MDP IV  COMPARISON:  Nuclear bone scan of May 20, 2014  FINDINGS: There is adequate uptake of the radiopharmaceutical by the skeleton. Adequate soft tissue clearance and renal activity is demonstrated.  The previously noted area of increased uptake in the inferior aspect of the right ischium is less conspicuous today. No new abnormal uptake in the pelvis is observed.  Activity within the calvarium, spine, ribs, pectoral girdle, and lower extremities is within the limits of normal.  IMPRESSION: 1. Decreased conspicuity of increased uptake in the inferior aspect of the right ischium posteriorly. No new abnormal uptake within the pelvis is demonstrated. 2. Elsewhere there are no areas of abnormal uptake to suggest metastatic disease.   Electronically Signed   By: David  Martinique M.D.   On: 12/20/2014 12:23     ASSESSMENT: 54 y.o.  BRCA  negative [place] woman ,  (1) status post bilateral mastectomies January 2010 for a left-sided T2 N0 (stage IIA) invasive ductal carcinoma, grade 1, strongly estrogen and progesterone receptor positive, HER-2 nonamplified,   (2) Oncotype DX recurrence score of 17, predicting a 10-12% distant recurrence risk over 10 years if her only adjuvant treatment is tamoxifen for 5 years  (3) status post Left postmastectomy radiation including Left supraclavicular fossa (5040 cGy in 28 sessions)  (4) on tamoxifen starting February of 2010, stopped May 2015 because of endometrial hyperplasia  (5) osteopenia-- T score -1.2 on repeated bone density scans 09/24/2011 and 12/12/2013  RECURRENT DISEASE (6) skin biopsy of a lesion under the left breast scar 04/21/2014 shows adenocarcinoma, gross cystic disease fluid protein, estrogen and progesterone receptor positive  (a) s/p wide excision with negative though close margins 06/01/2014, prognostic panel pending  (b) radiation to Left chest wall 4000 cGy in 20 sessions, left schest wall boost 1000 cGy in 5 session 07/12/2014 through 08/16/2014  (7) PET scan and bone scan show only one additional area of concern, in the right ischiial tuberosity--  (a) biopsy 05/24/2014 showed no evidence of malignancy  (b) zolendronate started 06/06/2014, poorly tolerated  (c) denosumab/ Xgeva started 09/26/2014  (d) repeat bone scan 12/20/2014 shows no new areas of disease, and the previously biopsied area to be December  (8) goserelin monthly started 05/09/2016, consider eventual BSO  (9) anastrozole started  08/29/2014  (10) considering palbociclib   PLAN: I spent approximately one hour with Erin [name] going over her complex situation. But overall: I am very encouraged that two radiation oncologists were sufficiently doubtful about her bone lesion representing metastatic disease that they were reluctant to proceed with stereotactic radiotherapy.  She has a good  understanding of the possible benefits as well as the possible complications of stereotactic radiotherapy. We discussed that. We also discussed the possibility of proceeding again with biopsy. I have reviewed the prior biopsy with interventional radiology and they were confident the needle was word was supposed to be.  I don't think a second biopsy would be much more informative: Especially now that we have a bone scan showing the lesion in question to have become attenuated. If we do a biopsy and it is negative, we are not going to know whether this was treated tumor or never positive.  Accordingly after much discussion we decided we would go for close observation. She will have a PET scan this December, then again in June and December of next year. We can repeat a bone scan either in March or September of next year and at some point we probably should do an MRI of the bone lesion in question since that imaging modality can provide [place] more detail I think than any other as far as that specific focus is concerned.  Of course we are continuing the anastrozole. I do not see an indication for adding Palbociclib at this time, although if we did document progression what we would do most likely is switched to letrozole/Palbociclib. We are continuing the goserelin monthly until she has her BSO surgery in November. We are continuing the monthly denosumab through this year but then switching to every 2 months for the first 6 months of 2017 and every 3 months for the remaining of that year.  Finally I think if she tries low dose gabapentin at that same she may have more success with her nighttime hot flashes.  Erin [name] has a good understanding of this plan. She agrees with it. She knows at this point we do not have definitive evidence that she has stage IV disease. This is very encouraging. She will call with any problems that may develop before her next visit here.  Chauncey Cruel, MD  01/13/2015

## 2015-01-13 NOTE — Telephone Encounter (Signed)
Left message confirming appointment change per pof in December

## 2015-01-13 NOTE — Telephone Encounter (Signed)
9/22 pof completed,patient active on mychart and will get a new avs 9/30

## 2015-01-13 NOTE — Telephone Encounter (Signed)
PT. STATES SHE IS TOLERATING HER NIGHT TIME HOT FLASHES AND DOES NOT WANT ANY MEDICATION AT THIS TIME.

## 2015-01-13 NOTE — Telephone Encounter (Signed)
POF SENT TO SCHEDULING TO CHANGE 04/03/15 LAB AND INJECTION TO 04/07/15. ALSO LEFT MESSAGE ON AMBER BLEDSOLE'S VOICE MAIL.

## 2015-01-20 ENCOUNTER — Other Ambulatory Visit (HOSPITAL_BASED_OUTPATIENT_CLINIC_OR_DEPARTMENT_OTHER): Payer: BC Managed Care – PPO

## 2015-01-20 ENCOUNTER — Ambulatory Visit (HOSPITAL_BASED_OUTPATIENT_CLINIC_OR_DEPARTMENT_OTHER): Payer: BC Managed Care – PPO

## 2015-01-20 VITALS — BP 158/68 | HR 68 | Temp 98.6°F

## 2015-01-20 DIAGNOSIS — C50912 Malignant neoplasm of unspecified site of left female breast: Secondary | ICD-10-CM

## 2015-01-20 DIAGNOSIS — M8580 Other specified disorders of bone density and structure, unspecified site: Secondary | ICD-10-CM

## 2015-01-20 DIAGNOSIS — C50411 Malignant neoplasm of upper-outer quadrant of right female breast: Secondary | ICD-10-CM | POA: Diagnosis not present

## 2015-01-20 DIAGNOSIS — C50919 Malignant neoplasm of unspecified site of unspecified female breast: Secondary | ICD-10-CM

## 2015-01-20 DIAGNOSIS — Z5111 Encounter for antineoplastic chemotherapy: Secondary | ICD-10-CM

## 2015-01-20 DIAGNOSIS — C50412 Malignant neoplasm of upper-outer quadrant of left female breast: Secondary | ICD-10-CM

## 2015-01-20 DIAGNOSIS — N8500 Endometrial hyperplasia, unspecified: Secondary | ICD-10-CM

## 2015-01-20 LAB — COMPREHENSIVE METABOLIC PANEL (CC13)
ALBUMIN: 4 g/dL (ref 3.5–5.0)
ALK PHOS: 67 U/L (ref 40–150)
ALT: 14 U/L (ref 0–55)
AST: 17 U/L (ref 5–34)
Anion Gap: 6 mEq/L (ref 3–11)
BILIRUBIN TOTAL: 1.12 mg/dL (ref 0.20–1.20)
BUN: 14.3 mg/dL (ref 7.0–26.0)
CO2: 29 mEq/L (ref 22–29)
CREATININE: 0.8 mg/dL (ref 0.6–1.1)
Calcium: 9.2 mg/dL (ref 8.4–10.4)
Chloride: 104 mEq/L (ref 98–109)
EGFR: 83 mL/min/{1.73_m2} — ABNORMAL LOW (ref >90)
GLUCOSE: 97 mg/dL (ref 70–140)
Potassium: 4 mEq/L (ref 3.5–5.1)
SODIUM: 139 meq/L (ref 136–145)
TOTAL PROTEIN: 6.6 g/dL (ref 6.4–8.3)

## 2015-01-20 LAB — CBC WITH DIFFERENTIAL/PLATELET
BASO%: 0.8 % (ref 0.0–2.0)
Basophils Absolute: 0 10*3/uL (ref 0.0–0.1)
EOS ABS: 0.1 10*3/uL (ref 0.0–0.5)
EOS%: 2.4 % (ref 0.0–7.0)
HCT: 44.2 % (ref 34.8–46.6)
HEMOGLOBIN: 14.7 g/dL (ref 11.6–15.9)
LYMPH#: 1.4 10*3/uL (ref 0.9–3.3)
LYMPH%: 24 % (ref 14.0–49.7)
MCH: 28.2 pg (ref 25.1–34.0)
MCHC: 33.3 g/dL (ref 31.5–36.0)
MCV: 84.9 fL (ref 79.5–101.0)
MONO#: 0.5 10*3/uL (ref 0.1–0.9)
MONO%: 8.6 % (ref 0.0–14.0)
NEUT%: 64.2 % (ref 38.4–76.8)
NEUTROS ABS: 3.7 10*3/uL (ref 1.5–6.5)
Platelets: 203 10*3/uL (ref 145–400)
RBC: 5.21 10*6/uL (ref 3.70–5.45)
RDW: 13.8 % (ref 11.2–14.5)
WBC: 5.7 10*3/uL (ref 3.9–10.3)

## 2015-01-20 MED ORDER — GOSERELIN ACETATE 3.6 MG ~~LOC~~ IMPL
3.6000 mg | DRUG_IMPLANT | Freq: Once | SUBCUTANEOUS | Status: AC
  Administered 2015-01-20: 3.6 mg via SUBCUTANEOUS
  Filled 2015-01-20: qty 3.6

## 2015-01-20 MED ORDER — DENOSUMAB 120 MG/1.7ML ~~LOC~~ SOLN
120.0000 mg | Freq: Once | SUBCUTANEOUS | Status: AC
  Administered 2015-01-20: 120 mg via SUBCUTANEOUS
  Filled 2015-01-20: qty 1.7

## 2015-01-20 NOTE — Progress Notes (Signed)
Patient was here in the Erin [name] for Xgeva injection.  She was concerned because this week her "joints in her jaw" seem to be bothering her.  She does have a history of TMJ and wears a night gard.  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  Per Nira Conn, NP patient should have her injection today and if the jaw issues persist or worsens she should see her dentist.  Patient has a regular dental appt in a couple of weeks for a cleaning, she will talk with her dentist if the problem continues.

## 2015-01-21 LAB — FOLLICLE STIMULATING HORMONE: FSH: 9.6 m[IU]/mL

## 2015-01-24 LAB — ESTRADIOL, ULTRA SENS: Estradiol, Ultra Sensitive: 13 pg/mL

## 2015-02-09 ENCOUNTER — Other Ambulatory Visit: Payer: Self-pay | Admitting: Adult Health

## 2015-02-09 ENCOUNTER — Ambulatory Visit (INDEPENDENT_AMBULATORY_CARE_PROVIDER_SITE_OTHER): Payer: BC Managed Care – PPO | Admitting: Adult Health

## 2015-02-09 ENCOUNTER — Encounter: Payer: Self-pay | Admitting: Adult Health

## 2015-02-09 VITALS — BP 140/90 | Temp 98.6°F | Ht 65.0 in | Wt 158.6 lb

## 2015-02-09 DIAGNOSIS — I10 Essential (primary) hypertension: Secondary | ICD-10-CM

## 2015-02-09 DIAGNOSIS — Z0000 Encounter for general adult medical examination without abnormal findings: Secondary | ICD-10-CM

## 2015-02-09 DIAGNOSIS — K146 Glossodynia: Secondary | ICD-10-CM | POA: Diagnosis not present

## 2015-02-09 DIAGNOSIS — G459 Transient cerebral ischemic attack, unspecified: Secondary | ICD-10-CM

## 2015-02-09 NOTE — Progress Notes (Signed)
Pre visit review using our clinic review tool, if applicable. No additional management support is needed unless otherwise documented below in the visit note. 

## 2015-02-09 NOTE — Progress Notes (Signed)
Subjective:    Patient ID: Erin [name], female    DOB: May 20, 1960, 54 y.o.   MRN: [id number]  HPI  54 year old female who presents to the office today for "sores on my tongue". She was seen at her dentist on Tuesday and her dentist notice a red irritation on both sides of her tongue during the oral cancer screen.Patient has not noticed any sores in her mouth and denies any pain in or around her tongue. Does not have a smoking or smokeless tobacco history.  During the exam that dentist noticed that her tongue was not " laying as flat as it has in the past and she thought it was hanging over one side more so than the other. The dentist was concerned for TIA.   She does complain of headaches but she gets them from the seasonal allergies and they resolve with OTC medication. She denies any blurred vision, facial droop, unilateral weakness, difficulty walking or lightheadedness.  She is visibly upset at this office visit, she is tearful during most of the exam. Per patient she has had reoccurrence of her breast cancer, she has been going through menopause, and her blood pressure has been fluctuating. She is now upset that she may be having TIA's.    Review of Systems  Constitutional: Positive for fever. Negative for diaphoresis, activity change, appetite change and fatigue.  HENT: Positive for mouth sores.   Eyes: Negative.   Endocrine: Negative.   Genitourinary: Negative.   Musculoskeletal: Negative.   Neurological: Positive for headaches. Negative for dizziness and numbness.  Hematological: Negative.   Psychiatric/Behavioral: Negative.   All other systems reviewed and are negative.  Past Medical History  Diagnosis Date  . Hypertension   . Allergic rhinitis due to pollen   . Osteopenia   . Hypothyroidism   . Breast cancer ([place]) 2010  . Wears glasses   . S/P radiation therapy 07/12/2014 through 08/16/2014     Left chest wall 4000  cGy in 20 sessions, left chest wall boost 1000 cGy in 5 sessions    Social History   Social History  . Marital Status: Single    Spouse Name: N/A  . Number of Children: N/A  . Years of Education: N/A   Occupational History  . Not on file.   Social History Main Topics  . Smoking status: Never Smoker   . Smokeless tobacco: Never Used  . Alcohol Use: 0.0 oz/week    0 Standard drinks or equivalent per week     Comment: rare  . Drug Use: No  . Sexual Activity: Yes    Birth Control/ Protection: None   Other Topics Concern  . Not on file   Social History Narrative   She grew up in [place] last 16 yrs in [place]    Past Surgical History  Procedure Laterality Date  . Tonsillectomy    . Right knee arthroscopy  2008  . Inguinal hernia repair      right side as infant  . Wisdom tooth extraction    . Cyst removed      left lower jaw - at age 39 yr  . Dilatation & currettage/hysteroscopy with resectocope N/A 09/16/2013    Procedure: DILATATION & CURETTAGE/HYSTEROSCOPY WITH RESECTOCOPE;  Surgeon: Princess Bruins, MD;  Location: [facility];  Service: Gynecology;  Laterality: N/A;  1 hr.  . Mastectomy  2010    bilateral mastectomies-lt snbx-radiation post  . Colonoscopy    . Breast lumpectomy  Left 06/01/2014    Procedure: WIDE LOCAL EXCISION OF RECURRENT LEFT BREAST CANCER;  Surgeon: Rolm Bookbinder, MD;  Location: [facility];  Service: General;  Laterality: Left;    Family History  Problem Relation Age of Onset  . Hypertension Mother   . Cancer Mother     Lung Cancer - long history of tobacco use  . Hypertension Father   . Colon cancer Neg Hx   . Stomach cancer Neg Hx     Allergies  Allergen Reactions  . Pollen Extract   . Adhesive [Tape] Rash    Current Outpatient Prescriptions on File Prior to Visit  Medication Sig Dispense Refill  . acyclovir ointment (ZOVIRAX) 5 % Apply 1 application topically every 3 (three) hours. As needed 5 g 5  .  anastrozole (ARIMIDEX) 1 MG tablet Take 1 tablet (1 mg total) by mouth daily. 90 tablet 4  . calcium carbonate (OS-CAL) 600 MG TABS tablet Take 600 mg by mouth daily with breakfast.     . Cholecalciferol (VITAMIN D3) 2000 UNITS capsule Take 2,000 Units by mouth daily.      . Denosumab (XGEVA [place]) Inject 120 mg into the skin every 30 (thirty) days.     Marland Kitchen levothyroxine (SYNTHROID, LEVOTHROID) 75 MCG tablet Take 1 tablet (75 mcg total) by mouth daily. 90 tablet 3  . losartan (COZAAR) 100 MG tablet Take 1 tablet (100 mg total) by mouth daily. 90 tablet 4  . Polyethyl Glycol-Propyl Glycol (SYSTANE OP) Apply 1 drop to eye as needed (for allergies).    . sodium chloride (OCEAN) 0.65 % SOLN nasal spray Place 1 spray into both nostrils as needed for congestion.    Marland Kitchen goserelin (ZOLADEX) 10.8 MG injection Inject 10.8 mg into the skin every 30 (thirty) days.     Marland Kitchen loratadine (CLARITIN) 10 MG tablet Take 10 mg by mouth daily as needed for allergies.      No current facility-administered medications on file prior to visit.    BP 140/90 mmHg  Temp(Src) 98.6 F (37 C) (Oral)  Ht 5\' 5"  (1.651 m)  Wt 158 lb 9.6 oz (71.94 kg)  BMI 26.39 kg/m2       Objective:   Physical Exam  Constitutional: She is oriented to person, place, and time. She appears well-developed and well-nourished. No distress.  HENT:  Head: Normocephalic and atraumatic.  Right Ear: External ear normal.  Left Ear: External ear normal.  Nose: Nose normal.  Mouth/Throat: Oropharynx is clear and moist. No oropharyngeal exudate.  No facial droop Protruding tongue is midline Resting tongue is midline No sores seen on tongue or mouth.   Eyes: Conjunctivae and EOM are normal. Pupils are equal, round, and reactive to light. Right eye exhibits no discharge. Left eye exhibits no discharge.  Neck: Normal range of motion. Neck supple.  Cardiovascular: Normal rate, regular rhythm, normal heart sounds and intact distal pulses.  Exam reveals no  gallop and no friction rub.   No murmur heard. Pulmonary/Chest: Effort normal and breath sounds normal. No respiratory distress. She has no wheezes. She has no rales. She exhibits no tenderness.  Musculoskeletal: Normal range of motion. She exhibits no edema or tenderness.  Lymphadenopathy:    She has no cervical adenopathy.  Neurological: She is alert and oriented to person, place, and time. She has normal reflexes. She displays normal reflexes. No cranial nerve deficit. She exhibits normal muscle tone. Coordination normal.  5/5 strength in bilateral upper and lower extremities Normal sensation  throughout No slurred speech  Skin: Skin is warm and dry. No rash noted. She is not diaphoretic. No erythema. No pallor.  Psychiatric: She has a normal mood and affect. Her behavior is normal. Judgment and thought content normal.  Nursing note and vitals reviewed.      Assessment & Plan:  1. Transient cerebral ischemia, unspecified transient cerebral ischemia type  - Patient is not experiencing any signs of TIA during this visit.  - MRI/CT not warranted at this time. Especially since it has been 24 hours since seeing the dentist - Follow up in the ER with any signs of TIA  2. Tongue sore - No signs of any abnormality on tongue or in mouth  3. Essential hypertension - Continue to monitor. She endorses that her BP at home has been between 364-680 systolic.  - Lipid panel; Future - Consider adding BP medication at CPE  4. Routine general medical examination at a health care facility - she would like her labs drawn prior to exam. The rest of her labs will be drawn by Oncology at a visit prior to her CPE. - Hemoglobin A1c; Future - Lipid panel; Future - POCT urinalysis dipstick; Future

## 2015-02-17 ENCOUNTER — Other Ambulatory Visit: Payer: Self-pay | Admitting: Obstetrics & Gynecology

## 2015-02-20 ENCOUNTER — Other Ambulatory Visit (HOSPITAL_BASED_OUTPATIENT_CLINIC_OR_DEPARTMENT_OTHER): Payer: BC Managed Care – PPO

## 2015-02-20 ENCOUNTER — Ambulatory Visit (HOSPITAL_BASED_OUTPATIENT_CLINIC_OR_DEPARTMENT_OTHER): Payer: BC Managed Care – PPO

## 2015-02-20 VITALS — BP 148/78 | HR 82 | Temp 98.2°F | Resp 16

## 2015-02-20 DIAGNOSIS — C50919 Malignant neoplasm of unspecified site of unspecified female breast: Secondary | ICD-10-CM

## 2015-02-20 DIAGNOSIS — C50412 Malignant neoplasm of upper-outer quadrant of left female breast: Secondary | ICD-10-CM

## 2015-02-20 DIAGNOSIS — Z5111 Encounter for antineoplastic chemotherapy: Secondary | ICD-10-CM

## 2015-02-20 DIAGNOSIS — M8580 Other specified disorders of bone density and structure, unspecified site: Secondary | ICD-10-CM | POA: Diagnosis not present

## 2015-02-20 DIAGNOSIS — N8500 Endometrial hyperplasia, unspecified: Secondary | ICD-10-CM

## 2015-02-20 DIAGNOSIS — Z79899 Other long term (current) drug therapy: Secondary | ICD-10-CM

## 2015-02-20 DIAGNOSIS — C50912 Malignant neoplasm of unspecified site of left female breast: Secondary | ICD-10-CM

## 2015-02-20 LAB — COMPREHENSIVE METABOLIC PANEL (CC13)
ALT: 12 U/L (ref 0–55)
ANION GAP: 6 meq/L (ref 3–11)
AST: 16 U/L (ref 5–34)
Albumin: 3.8 g/dL (ref 3.5–5.0)
Alkaline Phosphatase: 69 U/L (ref 40–150)
BILIRUBIN TOTAL: 0.94 mg/dL (ref 0.20–1.20)
BUN: 17.2 mg/dL (ref 7.0–26.0)
CHLORIDE: 107 meq/L (ref 98–109)
CO2: 29 meq/L (ref 22–29)
Calcium: 9.8 mg/dL (ref 8.4–10.4)
Creatinine: 0.8 mg/dL (ref 0.6–1.1)
EGFR: 80 mL/min/{1.73_m2} — ABNORMAL LOW (ref >90)
Glucose: 110 mg/dl (ref 70–140)
POTASSIUM: 4.3 meq/L (ref 3.5–5.1)
Sodium: 141 mEq/L (ref 136–145)
Total Protein: 6.7 g/dL (ref 6.4–8.3)

## 2015-02-20 LAB — CBC WITH DIFFERENTIAL/PLATELET
BASO%: 0.7 % (ref 0.0–2.0)
Basophils Absolute: 0 10*3/uL (ref 0.0–0.1)
EOS%: 1.7 % (ref 0.0–7.0)
Eosinophils Absolute: 0.1 10*3/uL (ref 0.0–0.5)
HCT: 43.2 % (ref 34.8–46.6)
HGB: 14.5 g/dL (ref 11.6–15.9)
LYMPH%: 22.7 % (ref 14.0–49.7)
MCH: 28.5 pg (ref 25.1–34.0)
MCHC: 33.5 g/dL (ref 31.5–36.0)
MCV: 85.3 fL (ref 79.5–101.0)
MONO#: 0.5 10*3/uL (ref 0.1–0.9)
MONO%: 8.3 % (ref 0.0–14.0)
NEUT#: 3.9 10*3/uL (ref 1.5–6.5)
NEUT%: 66.6 % (ref 38.4–76.8)
PLATELETS: 226 10*3/uL (ref 145–400)
RBC: 5.07 10*6/uL (ref 3.70–5.45)
RDW: 14 % (ref 11.2–14.5)
WBC: 5.8 10*3/uL (ref 3.9–10.3)
lymph#: 1.3 10*3/uL (ref 0.9–3.3)

## 2015-02-20 LAB — TSH CHCC: TSH: 1.58 m[IU]/L (ref 0.308–3.960)

## 2015-02-20 MED ORDER — DENOSUMAB 120 MG/1.7ML ~~LOC~~ SOLN
120.0000 mg | Freq: Once | SUBCUTANEOUS | Status: AC
  Administered 2015-02-20: 120 mg via SUBCUTANEOUS
  Filled 2015-02-20: qty 1.7

## 2015-02-20 MED ORDER — GOSERELIN ACETATE 3.6 MG ~~LOC~~ IMPL
3.6000 mg | DRUG_IMPLANT | Freq: Once | SUBCUTANEOUS | Status: AC
  Administered 2015-02-20: 3.6 mg via SUBCUTANEOUS
  Filled 2015-02-20: qty 3.6

## 2015-02-22 ENCOUNTER — Other Ambulatory Visit: Payer: Self-pay | Admitting: *Deleted

## 2015-02-27 ENCOUNTER — Encounter: Payer: Self-pay | Admitting: Neurology

## 2015-02-27 ENCOUNTER — Ambulatory Visit (INDEPENDENT_AMBULATORY_CARE_PROVIDER_SITE_OTHER): Payer: BC Managed Care – PPO | Admitting: Neurology

## 2015-02-27 VITALS — BP 160/100 | HR 72 | Resp 18 | Ht 65.0 in | Wt 159.0 lb

## 2015-02-27 DIAGNOSIS — IMO0001 Reserved for inherently not codable concepts without codable children: Secondary | ICD-10-CM

## 2015-02-27 DIAGNOSIS — R030 Elevated blood-pressure reading, without diagnosis of hypertension: Secondary | ICD-10-CM

## 2015-02-27 DIAGNOSIS — Z658 Other specified problems related to psychosocial circumstances: Secondary | ICD-10-CM | POA: Diagnosis not present

## 2015-02-27 DIAGNOSIS — F439 Reaction to severe stress, unspecified: Secondary | ICD-10-CM

## 2015-02-27 DIAGNOSIS — K148 Other diseases of tongue: Secondary | ICD-10-CM

## 2015-02-27 NOTE — Progress Notes (Signed)
Subjective:    Patient ID: Erin [name] is a 54 y.o. female.  HPI     Star Age, MD, PhD [facility] [address], [place] [address]  Dear Dr. Dellis Filbert,   I saw your patient, Erin [name], upon your kind request in my neurologic clinic today for initial consultation of her questionable TIA. The patient is unaccompanied today. As you know, [name] is a 54 year old right-handed woman with an underlying medical history of breast cancer, status post bilateral mastectomies, and status post XRT, hypertension, allergic rhinitis, osteopenia, hypothyroidism, status post tonsillectomy, right knee arthroscopic surgery, wisdom tooth extraction, and overweight state, who reports a possible transient tongue deviation to one side, as noted by her dentist a few weeks ago. When she went back for the follow-up appointment about a week ago she had no further signs of tongue deviation. She has self had not noticed any problems with her tongue, her mouth, her speech, and for that matter, had no one-sided weakness or numbness. Sometimes she gets numbness in her right arm because she is at the computer a lot and has occasional tingling in her fingertips but this goes away with position changes. She does not have significant headaches but has had recurrent headaches in the context of sinus symptoms and allergy symptoms. She does not have any visual problems. She has a family history of stroke in her grandmother. She herself has very few vascular risk factors and has had some increased blood pressure values lately. She is scheduled for a elective oophorectomy later this month. With the recent scare of potentially having had a TIA she is very stressed about this. She feels at baseline and has felt at baseline throughout this, but does admit to stressing over this. She has no problems sleeping generally speaking. Sometimes she has trouble going to sleep. She does not  take any sleeping pills. She denies any depression. Nevertheless, she is somewhat tearful today because she is stressed out. She does not snore or have any apneic breathing pauses while asleep. Of note, she lives alone. She is a Automotive engineer at [employer]. She is a never smoker. She drinks alcohol rarely and does not drink caffeine on a daily basis. She tries to stay active mentally and physically. She tries to drink enough water.  She had a brain MRI with contrast on 07/04/2014 which showed no acute abnormalities and no evidence of metastatic disease. In addition, I personally reviewed the images through the PACS system and agree with the report. She had a nuclear medicine whole body bone scan on 12/20/2014: 1. Decreased conspicuity of increased uptake in the inferior aspect of the right ischium posteriorly. No new abnormal uptake within the pelvis is demonstrated. 2. Elsewhere there are no areas of abnormal uptake to suggest metastatic disease.  Her Past Medical History Is Significant For: Past Medical History  Diagnosis Date  . Hypertension   . Allergic rhinitis due to pollen   . Osteopenia   . Hypothyroidism   . Breast cancer ([place]) 2010  . Wears glasses   . S/P radiation therapy 07/12/2014 through 08/16/2014     Left chest wall 4000 cGy in 20 sessions, left chest wall boost 1000 cGy in 5 sessions    Her Past Surgical History Is Significant For: Past Surgical History  Procedure Laterality Date  . Tonsillectomy    . Right knee arthroscopy  2008  . Inguinal hernia repair      right side  as infant  . Wisdom tooth extraction    . Cyst removed      left lower jaw - at age 25 yr  . Dilatation & currettage/hysteroscopy with resectocope N/A 09/16/2013    Procedure: DILATATION & CURETTAGE/HYSTEROSCOPY WITH RESECTOCOPE;  Surgeon: Princess Bruins, MD;  Location: [facility];  Service: Gynecology;  Laterality: N/A;  1 hr.  . Mastectomy  2010     bilateral mastectomies-lt snbx-radiation post  . Colonoscopy    . Breast lumpectomy Left 06/01/2014    Procedure: WIDE LOCAL EXCISION OF RECURRENT LEFT BREAST CANCER;  Surgeon: Rolm Bookbinder, MD;  Location: [facility];  Service: General;  Laterality: Left;  . Oophorectomy  2016    Her Family History Is Significant For: Family History  Problem Relation Age of Onset  . Hypertension Mother   . Cancer Mother     Lung Cancer - long history of tobacco use  . Hypertension Father   . Colon cancer Neg Hx   . Stomach cancer Neg Hx   . Stroke Maternal Grandfather   . Stroke Paternal Grandfather     Her Social History Is Significant For: Social History   Social History  . Marital Status: Single    Spouse Name: N/A  . Number of Children: 0  . Years of Education: PhD   Social History Main Topics  . Smoking status: Never Smoker   . Smokeless tobacco: Never Used  . Alcohol Use: 0.0 oz/week    0 Standard drinks or equivalent per week     Comment: rare  . Drug Use: No  . Sexual Activity: Yes    Birth Control/ Protection: None   Other Topics Concern  . None   Social History Narrative   She grew up in [place] last 16 yrs in [place]   Rare caffeine use     Her Allergies Are:  Allergies  Allergen Reactions  . Pollen Extract   . Adhesive [Tape] Rash  :   Her Current Medications Are:  Outpatient Encounter Prescriptions as of 02/27/2015  Medication Sig  . acetaminophen (TYLENOL) 500 MG tablet Take 500 mg by mouth every 6 (six) hours as needed for moderate pain or headache.  Marland Kitchen acyclovir ointment (ZOVIRAX) 5 % Apply 1 application topically every 3 (three) hours. As needed  . anastrozole (ARIMIDEX) 1 MG tablet Take 1 tablet (1 mg total) by mouth daily.  . calcium carbonate (TUMS - DOSED IN MG ELEMENTAL CALCIUM) 500 MG chewable tablet Chew 4 tablets by mouth daily as needed (follow-up therapy to replace calcium after xgeva.).  Marland Kitchen Calcium Carbonate-Vitamin D  (OS-CAL 500 + D PO) Take 1 tablet by mouth daily.  . Cholecalciferol (VITAMIN D3) 2000 UNITS capsule Take 2,000 Units by mouth daily.    . Denosumab (XGEVA [employer]) Inject 120 mg into the skin every 30 (thirty) days. Last injection was 02/20/15.  Marland Kitchen goserelin (ZOLADEX) 10.8 MG injection Inject 10.8 mg into the skin every 30 (thirty) days. Last dose on 02/20/15.  . ibuprofen (ADVIL,MOTRIN) 200 MG tablet Take 200 mg by mouth every 6 (six) hours as needed for moderate pain.  Marland Kitchen levothyroxine (SYNTHROID, LEVOTHROID) 75 MCG tablet Take 1 tablet (75 mcg total) by mouth daily.  Marland Kitchen loratadine (CLARITIN) 10 MG tablet Take 10 mg by mouth daily as needed for allergies.   Marland Kitchen losartan (COZAAR) 100 MG tablet Take 1 tablet (100 mg total) by mouth daily.  Vladimir Faster Glycol-Propyl Glycol (SYSTANE OP) Apply 1 drop to  eye as needed (for allergies).  . sodium chloride (OCEAN) 0.65 % SOLN nasal spray Place 1 spray into both nostrils as needed for congestion.   No facility-administered encounter medications on file as of 02/27/2015.  :   Review of Systems:  Out of a complete 14 point review of systems, all are reviewed and negative with the exception of these symptoms as listed below:  Review of Systems  Cardiovascular:       Patient has some circulation concerns. Swelling in feet.   Neurological:       Patient has been sent here to r/o TIA. Her dentist noticed that her tongue seemed to be "off center", at next visit the dentist did not see deviation any more. Family of H/O stroke and heart problems.   Pain in numbness in hands and arms, usually relieved with pressure and wrist splints.     Objective:  Neurologic Exam  Physical Exam Physical Examination:   Filed Vitals:   02/27/15 1301  BP: 160/100  Pulse: 72  Resp: 18    General Examination: The patient is a very pleasant 54 y.o. female in no acute distress. She appears well-developed and well-nourished and well groomed. She is tearful at times.   HEENT:  Normocephalic, atraumatic, pupils are equal, round and reactive to light and accommodation. Funduscopic exam is normal with sharp disc margins noted. Extraocular tracking is good without limitation to gaze excursion or nystagmus noted. Normal smooth pursuit is noted. Hearing is grossly intact. Tympanic membranes are clear bilaterally. Face is symmetric with normal facial animation and normal facial sensation. Speech is clear with no dysarthria noted. There is no hypophonia. There is no lip, neck/head, jaw or voice tremor. Neck is supple with full range of passive and active motion. There are no carotid bruits on auscultation. Oropharynx exam reveals: mild mouth dryness, good dental hygiene and no significant airway crowding, tonsils are absent. Mallampati is class I. Tongue protrudes centrally and palate elevates symmetrically.    Chest: Clear to auscultation without wheezing, rhonchi or crackles noted.  Heart: S1+S2+0, regular and normal without murmurs, rubs or gallops noted.   Abdomen: Soft, non-tender and non-distended with normal bowel sounds appreciated on auscultation.  Extremities: There is no pitting edema in the distal lower extremities bilaterally. Pedal pulses are intact.  Skin: Warm and dry without trophic changes noted. There are no varicose veins.  Musculoskeletal: exam reveals no obvious joint deformities, tenderness or joint swelling or erythema.   Neurologically:  Mental status: The patient is awake, alert and oriented in all 4 spheres. Her immediate and remote memory, attention, language skills and fund of knowledge are appropriate. There is no evidence of aphasia, agnosia, apraxia or anomia. Speech is clear with normal prosody and enunciation. Thought process is linear. Mood is normal and affect is normal.  Cranial nerves II - XII are as described above under HEENT exam. In addition: shoulder shrug is normal with equal shoulder height noted. Motor exam: Normal bulk, strength and  tone is noted. There is no drift, tremor or rebound. Romberg is negative. Reflexes are 2+ throughout. Babinski: Toes are flexor bilaterally. Fine motor skills and coordination: intact with normal finger taps, normal hand movements, normal rapid alternating patting, normal foot taps and normal foot agility.  Cerebellar testing: No dysmetria or intention tremor on finger to nose testing. Heel to shin is unremarkable bilaterally. There is no truncal or gait ataxia.  Sensory exam: intact to light touch, pinprick, vibration, temperature sense in the upper and  lower extremities.  Gait, station and balance: She stands easily. No veering to one side is noted. No leaning to one side is noted. Posture is age-appropriate and stance is narrow based. Gait shows normal stride length and normal pace. No problems turning are noted. She turns en bloc. Tandem walk is unremarkable. Intact toe and heel stance is noted.               Assessment and Plan:   In summary, Laterra Lubinski is a very pleasant 54 y.o.-year old female with an underlying medical history of breast cancer, status post bilateral mastectomies, and status post XRT, hypertension, allergic rhinitis, osteopenia, hypothyroidism, status post tonsillectomy, right knee arthroscopic surgery, wisdom tooth extraction, and overweight state, who presents with an observation of a possible tongue deviation that was transient as observed by her dentist recently.  her physical exam and neurological exam are completely nonfocal. In particular, I do not see any tongue deviation or any cranial nerve findings otherwise. She is quite anxious appearing and tearful at times. I tried to reassure her. I also explained to her that it is rather unusual to see a single cranial nerve abnormality as a presentation for TIA. I talked to her about her risk factors. She has very few vascular risk factors. I do not think we have to anticipate any problems with her upcoming elective oophorectomy.  I reassured her that we also do not need to do any further workup at this time. She is scheduled for blood work. We do want her to have normal thyroid function, good cholesterol, screening for diabetes and so forth. Weight management is also important. She is advised to continue to stay active mentally, physically and drink enough water and try to reduce stress. Her blood pressure is elevated today but I think this is an outlier. She has generally speaking better blood pressure values. I think this is elevated because she is stressed out. I do not think she needs imaging testing. She had a recent brain MRI with and without contrast which was negative for any acute changes. I suggested a 3 month checkup just to make sure she still feels well. Neurologically there is really nothing wrong with her and we mutually agreed to observe things.   Thank you very much for allowing me to participate in the care of this nice patient. If I can be of any further assistance to you please do not hesitate to call me at [phone].  Sincerely,   Star Age, MD, PhD

## 2015-02-27 NOTE — Patient Instructions (Signed)
Your exam is normal. You have very few risk factors for stroke and TIA.   No need for more test.   Please remember to try to maintain good sleep hygiene, which means: Keep a regular sleep and wake schedule, try not to exercise or have a meal within 2 hours of your bedtime, try to keep your bedroom conducive for sleep, that is, cool and dark, without light distractors such as an illuminated alarm clock, and refrain from watching TV right before sleep or in the middle of the night and do not keep the TV or radio on during the night. Also, try not to use or play on electronic devices at bedtime, such as your cell phone, tablet PC or laptop. If you like to read at bedtime on an electronic device, try to dim the background light as much as possible. Do not eat in the middle of the night.   Continue exercising regularly and take your medications as directed. As discussed, it is important to take care of blood sugar values, blood sugar, cholesterol and try to stay well hydrated and exercise regularly and take care of weight management.   I will see you back in 3 months, just to make sure you still feel well.

## 2015-02-28 NOTE — Patient Instructions (Addendum)
Your procedure is scheduled on:  Monday, NOV. 21, 2016  Enter through the Main Entrance of [facility] at: 11:30 A.M.  Pick up the phone at the desk and dial [phone].  Call this number if you have problems the morning of surgery: [phone].  Remember: Do NOT eat food:  AFTER MIDNIGHT Sunday, NOV. 20, 2016 Do NOT drink clear liquids after:  9:00 AM DAY OF SURGERY Take these medicines the morning of surgery with a SIP OF WATER:  LEVOTHYROXINE, LOSARTAN, Anastrozole  Do NOT wear jewelry (body piercing), metal hair clips/bobby pins, make-up, or nail polish. Do NOT wear lotions, powders, or perfumes.  You may wear deoderant. Do NOT shave for 48 hours prior to surgery. Do NOT bring valuables to the hospital. Contacts, dentures, or bridgework may not be worn into surgery.  Have a responsible adult drive you home and stay with you for 24 hours after your procedure  *Bring a copy of your healthcare power of attorney and living will day of surgery.

## 2015-03-01 ENCOUNTER — Encounter (HOSPITAL_COMMUNITY)
Admission: RE | Admit: 2015-03-01 | Discharge: 2015-03-01 | Disposition: A | Discharge status: Home / Self Care | Payer: BC Managed Care – PPO | Source: Ambulatory Visit | Attending: Obstetrics & Gynecology | Admitting: Obstetrics & Gynecology

## 2015-03-01 ENCOUNTER — Encounter (HOSPITAL_COMMUNITY): Payer: Self-pay

## 2015-03-01 ENCOUNTER — Other Ambulatory Visit: Payer: Self-pay

## 2015-03-01 DIAGNOSIS — C50919 Malignant neoplasm of unspecified site of unspecified female breast: Secondary | ICD-10-CM | POA: Diagnosis not present

## 2015-03-01 DIAGNOSIS — Z01818 Encounter for other preprocedural examination: Secondary | ICD-10-CM | POA: Diagnosis present

## 2015-03-01 LAB — TYPE AND SCREEN
ABO/RH(D): O POS
ANTIBODY SCREEN: NEGATIVE

## 2015-03-01 LAB — ABO/RH: ABO/RH(D): O POS

## 2015-03-13 ENCOUNTER — Ambulatory Visit (HOSPITAL_COMMUNITY): Payer: BC Managed Care – PPO | Admitting: Anesthesiology

## 2015-03-13 ENCOUNTER — Encounter (HOSPITAL_COMMUNITY): Payer: Self-pay | Admitting: Anesthesiology

## 2015-03-13 ENCOUNTER — Encounter (HOSPITAL_COMMUNITY)
Admission: RE | Disposition: A | Discharge status: Home / Self Care | Payer: Self-pay | Source: Ambulatory Visit | Attending: Obstetrics & Gynecology

## 2015-03-13 ENCOUNTER — Ambulatory Visit (HOSPITAL_COMMUNITY)
Admission: RE | Admit: 2015-03-13 | Discharge: 2015-03-13 | Disposition: A | Discharge status: Home / Self Care | Payer: BC Managed Care – PPO | Source: Ambulatory Visit | Attending: Obstetrics & Gynecology | Admitting: Obstetrics & Gynecology

## 2015-03-13 DIAGNOSIS — Z823 Family history of stroke: Secondary | ICD-10-CM | POA: Insufficient documentation

## 2015-03-13 DIAGNOSIS — Z4009 Encounter for prophylactic removal of other organ: Secondary | ICD-10-CM | POA: Diagnosis present

## 2015-03-13 DIAGNOSIS — C50919 Malignant neoplasm of unspecified site of unspecified female breast: Secondary | ICD-10-CM | POA: Diagnosis present

## 2015-03-13 DIAGNOSIS — Z79899 Other long term (current) drug therapy: Secondary | ICD-10-CM | POA: Diagnosis not present

## 2015-03-13 DIAGNOSIS — Z800 Family history of malignant neoplasm of digestive organs: Secondary | ICD-10-CM | POA: Diagnosis not present

## 2015-03-13 DIAGNOSIS — Z8249 Family history of ischemic heart disease and other diseases of the circulatory system: Secondary | ICD-10-CM | POA: Diagnosis not present

## 2015-03-13 DIAGNOSIS — I10 Essential (primary) hypertension: Secondary | ICD-10-CM | POA: Diagnosis not present

## 2015-03-13 DIAGNOSIS — Z9013 Acquired absence of bilateral breasts and nipples: Secondary | ICD-10-CM | POA: Insufficient documentation

## 2015-03-13 DIAGNOSIS — Z780 Asymptomatic menopausal state: Secondary | ICD-10-CM | POA: Diagnosis not present

## 2015-03-13 DIAGNOSIS — Z801 Family history of malignant neoplasm of trachea, bronchus and lung: Secondary | ICD-10-CM | POA: Diagnosis not present

## 2015-03-13 DIAGNOSIS — M8580 Other specified disorders of bone density and structure, unspecified site: Secondary | ICD-10-CM | POA: Insufficient documentation

## 2015-03-13 DIAGNOSIS — E039 Hypothyroidism, unspecified: Secondary | ICD-10-CM | POA: Insufficient documentation

## 2015-03-13 HISTORY — PX: ROBOTIC ASSISTED BILATERAL SALPINGO OOPHERECTOMY: SHX6078

## 2015-03-13 LAB — PREGNANCY, URINE: PREG TEST UR: NEGATIVE

## 2015-03-13 SURGERY — ROBOTIC ASSISTED BILATERAL SALPINGO OOPHORECTOMY
Anesthesia: General | Site: Abdomen | Laterality: Bilateral

## 2015-03-13 MED ORDER — GLYCOPYRROLATE 0.2 MG/ML IJ SOLN
INTRAMUSCULAR | Status: DC | PRN
  Administered 2015-03-13: 0.6 mg via INTRAVENOUS
  Administered 2015-03-13: 0.1 mg via INTRAVENOUS

## 2015-03-13 MED ORDER — NEOSTIGMINE METHYLSULFATE 10 MG/10ML IV SOLN
INTRAVENOUS | Status: DC | PRN
  Administered 2015-03-13: 3 mg via INTRAVENOUS

## 2015-03-13 MED ORDER — SODIUM CHLORIDE 0.9 % IJ SOLN
INTRAMUSCULAR | Status: AC
  Filled 2015-03-13: qty 50

## 2015-03-13 MED ORDER — ONDANSETRON HCL 4 MG/2ML IJ SOLN: 4.0000 mg | Freq: Once | INTRAMUSCULAR | Status: DC | PRN

## 2015-03-13 MED ORDER — PROPOFOL 10 MG/ML IV BOLUS
INTRAVENOUS | Status: AC
  Filled 2015-03-13: qty 20

## 2015-03-13 MED ORDER — CEFAZOLIN SODIUM-DEXTROSE 2-3 GM-% IV SOLR
2.0000 g | INTRAVENOUS | Status: AC
  Administered 2015-03-13: 2 g via INTRAVENOUS

## 2015-03-13 MED ORDER — LIDOCAINE HCL (CARDIAC) 20 MG/ML IV SOLN
INTRAVENOUS | Status: AC
  Filled 2015-03-13: qty 5

## 2015-03-13 MED ORDER — ROPIVACAINE HCL 5 MG/ML IJ SOLN
INTRAMUSCULAR | Status: AC
  Filled 2015-03-13: qty 30

## 2015-03-13 MED ORDER — METOCLOPRAMIDE HCL 5 MG/ML IJ SOLN
INTRAMUSCULAR | Status: DC | PRN
  Administered 2015-03-13: 10 mg via INTRAVENOUS

## 2015-03-13 MED ORDER — SCOPOLAMINE 1 MG/3DAYS TD PT72
1.0000 | MEDICATED_PATCH | Freq: Once | TRANSDERMAL | Status: DC
Start: 2015-03-13 — End: 2015-03-13
  Administered 2015-03-13: 1.5 mg via TRANSDERMAL

## 2015-03-13 MED ORDER — LIDOCAINE HCL (CARDIAC) 20 MG/ML IV SOLN
INTRAVENOUS | Status: DC | PRN
  Administered 2015-03-13: 80 mg via INTRAVENOUS

## 2015-03-13 MED ORDER — HYDROMORPHONE HCL 1 MG/ML IJ SOLN
INTRAMUSCULAR | Status: AC
  Filled 2015-03-13: qty 1

## 2015-03-13 MED ORDER — FENTANYL CITRATE (PF) 250 MCG/5ML IJ SOLN
INTRAMUSCULAR | Status: DC | PRN
  Administered 2015-03-13: 50 ug via INTRAVENOUS
  Administered 2015-03-13: 100 ug via INTRAVENOUS
  Administered 2015-03-13: 50 ug via INTRAVENOUS

## 2015-03-13 MED ORDER — LACTATED RINGERS IV SOLN
INTRAVENOUS | Status: DC
  Administered 2015-03-13 (×2): via INTRAVENOUS

## 2015-03-13 MED ORDER — DEXAMETHASONE SODIUM PHOSPHATE 4 MG/ML IJ SOLN
INTRAMUSCULAR | Status: DC | PRN
Start: 2015-03-13 — End: 2015-03-13
  Administered 2015-03-13: 4 mg via INTRAVENOUS

## 2015-03-13 MED ORDER — CEFAZOLIN SODIUM-DEXTROSE 2-3 GM-% IV SOLR
INTRAVENOUS | Status: AC
  Filled 2015-03-13: qty 50

## 2015-03-13 MED ORDER — SODIUM CHLORIDE 0.9 % IV SOLN
INTRAVENOUS | Status: DC | PRN
  Administered 2015-03-13: 60 mL

## 2015-03-13 MED ORDER — OXYCODONE-ACETAMINOPHEN 7.5-325 MG PO TABS: 1.0000 | ORAL_TABLET | ORAL | Status: DC | PRN

## 2015-03-13 MED ORDER — FENTANYL CITRATE (PF) 250 MCG/5ML IJ SOLN
INTRAMUSCULAR | Status: AC
  Filled 2015-03-13: qty 5

## 2015-03-13 MED ORDER — METOCLOPRAMIDE HCL 5 MG/ML IJ SOLN
INTRAMUSCULAR | Status: AC
  Filled 2015-03-13: qty 2

## 2015-03-13 MED ORDER — KETOROLAC TROMETHAMINE 30 MG/ML IJ SOLN
INTRAMUSCULAR | Status: DC | PRN
  Administered 2015-03-13: 30 mg via INTRAVENOUS

## 2015-03-13 MED ORDER — HEPARIN SODIUM (PORCINE) 5000 UNIT/ML IJ SOLN
INTRAMUSCULAR | Status: AC
  Filled 2015-03-13: qty 1

## 2015-03-13 MED ORDER — ONDANSETRON HCL 4 MG/2ML IJ SOLN
INTRAMUSCULAR | Status: AC
  Filled 2015-03-13: qty 2

## 2015-03-13 MED ORDER — ARTIFICIAL TEARS OP OINT
TOPICAL_OINTMENT | OPHTHALMIC | Status: DC | PRN
  Administered 2015-03-13: 1 via OPHTHALMIC

## 2015-03-13 MED ORDER — NEOSTIGMINE METHYLSULFATE 10 MG/10ML IV SOLN
INTRAVENOUS | Status: AC
  Filled 2015-03-13: qty 1

## 2015-03-13 MED ORDER — MIDAZOLAM HCL 2 MG/2ML IJ SOLN
INTRAMUSCULAR | Status: DC | PRN
  Administered 2015-03-13: 2 mg via INTRAVENOUS

## 2015-03-13 MED ORDER — SCOPOLAMINE 1 MG/3DAYS TD PT72
MEDICATED_PATCH | TRANSDERMAL | Status: AC
  Administered 2015-03-13: 1.5 mg via TRANSDERMAL
  Filled 2015-03-13: qty 1

## 2015-03-13 MED ORDER — BUPIVACAINE HCL (PF) 0.25 % IJ SOLN
INTRAMUSCULAR | Status: AC
  Filled 2015-03-13: qty 30

## 2015-03-13 MED ORDER — MIDAZOLAM HCL 2 MG/2ML IJ SOLN
INTRAMUSCULAR | Status: AC
  Filled 2015-03-13: qty 2

## 2015-03-13 MED ORDER — ONDANSETRON HCL 4 MG/2ML IJ SOLN
INTRAMUSCULAR | Status: DC | PRN
  Administered 2015-03-13: 4 mg via INTRAVENOUS

## 2015-03-13 MED ORDER — HYDROMORPHONE HCL 1 MG/ML IJ SOLN
INTRAMUSCULAR | Status: DC | PRN
  Administered 2015-03-13: 0.5 mg via INTRAVENOUS

## 2015-03-13 MED ORDER — PROPOFOL 10 MG/ML IV BOLUS
INTRAVENOUS | Status: DC | PRN
  Administered 2015-03-13: 150 mg via INTRAVENOUS

## 2015-03-13 MED ORDER — DEXAMETHASONE SODIUM PHOSPHATE 10 MG/ML IJ SOLN
INTRAMUSCULAR | Status: AC
  Filled 2015-03-13: qty 1

## 2015-03-13 MED ORDER — MEPERIDINE HCL 25 MG/ML IJ SOLN: 6.2500 mg | INTRAMUSCULAR | Status: DC | PRN

## 2015-03-13 MED ORDER — KETOROLAC TROMETHAMINE 30 MG/ML IJ SOLN: 30.0000 mg | Freq: Once | INTRAMUSCULAR | Status: DC | PRN

## 2015-03-13 MED ORDER — BUPIVACAINE HCL (PF) 0.25 % IJ SOLN
INTRAMUSCULAR | Status: DC | PRN
  Administered 2015-03-13: 30 mL

## 2015-03-13 MED ORDER — KETOROLAC TROMETHAMINE 30 MG/ML IJ SOLN
INTRAMUSCULAR | Status: AC
  Filled 2015-03-13: qty 1

## 2015-03-13 MED ORDER — ROCURONIUM BROMIDE 100 MG/10ML IV SOLN
INTRAVENOUS | Status: DC | PRN
  Administered 2015-03-13: 5 mg via INTRAVENOUS
  Administered 2015-03-13: 30 mg via INTRAVENOUS

## 2015-03-13 MED ORDER — FENTANYL CITRATE (PF) 100 MCG/2ML IJ SOLN: 25.0000 ug | INTRAMUSCULAR | Status: DC | PRN

## 2015-03-13 SURGICAL SUPPLY — 56 items
BARRIER ADHS 3X4 INTERCEED (GAUZE/BANDAGES/DRESSINGS) ×3 IMPLANT
CHLORAPREP W/TINT 26ML (MISCELLANEOUS) ×3 IMPLANT
CLOTH BEACON ORANGE TIMEOUT ST (SAFETY) ×3 IMPLANT
CONT PATH 16OZ SNAP LID 3702 (MISCELLANEOUS) ×3 IMPLANT
COVER BACK TABLE 60X90IN (DRAPES) ×6 IMPLANT
COVER TIP SHEARS 8 DVNC (MISCELLANEOUS) ×1 IMPLANT
COVER TIP SHEARS 8MM DA VINCI (MISCELLANEOUS) ×2
DECANTER SPIKE VIAL GLASS SM (MISCELLANEOUS) ×3 IMPLANT
DRAPE WARM FLUID 44X44 (DRAPE) ×3 IMPLANT
DRSG COVADERM PLUS 2X2 (GAUZE/BANDAGES/DRESSINGS) ×3 IMPLANT
DRSG OPSITE POSTOP 3X4 (GAUZE/BANDAGES/DRESSINGS) ×3 IMPLANT
ELECT REM PT RETURN 9FT ADLT (ELECTROSURGICAL) ×3
ELECTRODE REM PT RTRN 9FT ADLT (ELECTROSURGICAL) ×1 IMPLANT
GAUZE VASELINE 3X9 (GAUZE/BANDAGES/DRESSINGS) IMPLANT
GLOVE BIO SURGEON STRL SZ 6.5 (GLOVE) ×2 IMPLANT
GLOVE BIO SURGEONS STRL SZ 6.5 (GLOVE) ×1
GLOVE BIOGEL PI IND STRL 7.0 (GLOVE) ×6 IMPLANT
GLOVE BIOGEL PI INDICATOR 7.0 (GLOVE) ×12
IV STOPCOCK 4 WAY 40  W/Y SET (IV SOLUTION) ×2
IV STOPCOCK 4 WAY 40 W/Y SET (IV SOLUTION) ×1 IMPLANT
KIT ACCESSORY DA VINCI DISP (KITS) ×2
KIT ACCESSORY DVNC DISP (KITS) ×1 IMPLANT
LEGGING LITHOTOMY PAIR STRL (DRAPES) ×3 IMPLANT
LIQUID BAND (GAUZE/BANDAGES/DRESSINGS) ×6 IMPLANT
PACK ROBOT WH (CUSTOM PROCEDURE TRAY) ×3 IMPLANT
PACK ROBOTIC GOWN (GOWN DISPOSABLE) ×3 IMPLANT
PAD POSITIONING PINK XL (MISCELLANEOUS) ×3 IMPLANT
PAD PREP 24X48 CUFFED NSTRL (MISCELLANEOUS) ×6 IMPLANT
POUCH SPECIMEN RETRIEVAL 10MM (ENDOMECHANICALS) ×3 IMPLANT
SET CYSTO W/LG BORE CLAMP LF (SET/KITS/TRAYS/PACK) IMPLANT
SET IRRIG TUBING LAPAROSCOPIC (IRRIGATION / IRRIGATOR) ×6 IMPLANT
SET TRI-LUMEN FLTR TB AIRSEAL (TUBING) ×3 IMPLANT
SPONGE GAUZE 2X2 8PLY STER LF (GAUZE/BANDAGES/DRESSINGS) ×1
SPONGE GAUZE 2X2 8PLY STRL LF (GAUZE/BANDAGES/DRESSINGS) ×2 IMPLANT
SUT VIC AB 2-0 CT1 27 (SUTURE)
SUT VIC AB 2-0 CT1 TAPERPNT 27 (SUTURE) IMPLANT
SUT VIC AB 3-0 SH 27 (SUTURE)
SUT VIC AB 3-0 SH 27X BRD (SUTURE) IMPLANT
SUT VIC AB 4-0 PS2 27 (SUTURE) ×6 IMPLANT
SUT VICRYL 0 UR6 27IN ABS (SUTURE) ×6 IMPLANT
SYR 50ML LL SCALE MARK (SYRINGE) ×3 IMPLANT
SYSTEM CONVERTIBLE TROCAR (TROCAR) ×3 IMPLANT
TIP UTERINE 5.1X6CM LAV DISP (MISCELLANEOUS) ×3 IMPLANT
TIP UTERINE 6.7X10CM GRN DISP (MISCELLANEOUS) IMPLANT
TIP UTERINE 6.7X6CM WHT DISP (MISCELLANEOUS) IMPLANT
TIP UTERINE 6.7X8CM BLUE DISP (MISCELLANEOUS) IMPLANT
TOWEL OR 17X24 6PK STRL BLUE (TOWEL DISPOSABLE) ×9 IMPLANT
TRAY FOLEY BAG SILVER LF 16FR (SET/KITS/TRAYS/PACK) ×3 IMPLANT
TROCAR 12M 150ML BLUNT (TROCAR) IMPLANT
TROCAR DISP BLADELESS 8 DVNC (TROCAR) ×1 IMPLANT
TROCAR DISP BLADELESS 8MM (TROCAR) ×2
TROCAR OPTI TIP 12M 100M (ENDOMECHANICALS) IMPLANT
TROCAR PORT AIRSEAL 5X120 (TROCAR) ×3 IMPLANT
TROCAR XCEL 12X100 BLDLESS (ENDOMECHANICALS) ×3 IMPLANT
WARMER LAPAROSCOPE (MISCELLANEOUS) ×3 IMPLANT
WATER STERILE IRR 1000ML POUR (IV SOLUTION) ×9 IMPLANT

## 2015-03-13 NOTE — Discharge Summary (Signed)
  Physician Discharge Summary  Patient ID: Erin [name] MRN: [id number] DOB/AGE: 1960/10/06 54 y.o.  Admit date: 03/13/2015 Discharge date: 03/13/2015  Admission Diagnoses: Breast Cancer, Prophylactic Oophorectomy  Discharge Diagnoses: Breast Cancer, Prophylactic Oophorectomy        Active Problems:   * No active hospital problems. *   Discharged Condition: good  Hospital Course: good  Consults: None  Treatments: surgery: Robotic Bilateral Salpingo-Oophorectomy, Peritoneal Washings  Disposition: 01-Home or Self Care     Medication List    TAKE these medications        acetaminophen 500 MG tablet  Commonly known as:  TYLENOL  Take 500 mg by mouth every 6 (six) hours as needed for moderate pain or headache.     acyclovir ointment 5 %  Commonly known as:  ZOVIRAX  Apply 1 application topically every 3 (three) hours. As needed     anastrozole 1 MG tablet  Commonly known as:  ARIMIDEX  Take 1 tablet (1 mg total) by mouth daily.     calcium carbonate 500 MG chewable tablet  Commonly known as:  TUMS - dosed in mg elemental calcium  Chew 4 tablets by mouth daily as needed (follow-up therapy to replace calcium after xgeva.).     goserelin 10.8 MG injection  Commonly known as:  ZOLADEX  Inject 10.8 mg into the skin every 30 (thirty) days. Last dose on 02/20/15.     ibuprofen 200 MG tablet  Commonly known as:  ADVIL,MOTRIN  Take 200 mg by mouth every 6 (six) hours as needed for moderate pain.     levothyroxine 75 MCG tablet  Commonly known as:  SYNTHROID, LEVOTHROID  Take 1 tablet (75 mcg total) by mouth daily.     loratadine 10 MG tablet  Commonly known as:  CLARITIN  Take 10 mg by mouth daily as needed for allergies.     losartan 100 MG tablet  Commonly known as:  COZAAR  Take 1 tablet (100 mg total) by mouth daily.     OS-CAL 500 + D PO  Take 1 tablet by mouth daily.     oxyCODONE-acetaminophen 7.5-325 MG tablet  Commonly known as:  PERCOCET  Take 1  tablet by mouth every 4 (four) hours as needed for severe pain.     sodium chloride 0.65 % Soln nasal spray  Commonly known as:  OCEAN  Place 1 spray into both nostrils as needed for congestion.     SYSTANE OP  Apply 1 drop to eye as needed (for allergies).     Vitamin D3 2000 UNITS capsule  Take 2,000 Units by mouth daily.     XGEVA [place]  Inject 120 mg into the skin every 30 (thirty) days. Last injection was 02/20/15.           Follow-up Information    Follow up with Rickell Wiehe,MARIE-LYNE, MD In 3 weeks.   Specialty:  Obstetrics and Gynecology   Contact information:   [place] [address] [phone]       Signed: Princess Bruins, MD 03/13/2015, 3:26 PM

## 2015-03-13 NOTE — H&P (Signed)
Erin [name] is an 54 y.o. female  G0 single.  RP:  Breast Ca for Robotic BSO, peritoneal washings  Pertinent Gynecological History: Menses: Menopause/Chemotherapy Contraception: abstinence Blood transfusions: none Sexually transmitted diseases: no past history Previous GYN Procedures: HSC/D+C  Last pap: normal OB History: G0   Menstrual History:  No LMP recorded. Patient is not currently having periods (Reason: Chemotherapy).    Past Medical History  Diagnosis Date  . Hypertension   . Allergic rhinitis due to pollen   . Osteopenia   . Hypothyroidism   . Wears glasses   . S/P radiation therapy 07/12/2014 through 08/16/2014     Left chest wall 4000 cGy in 20 sessions, left chest wall boost 1000 cGy in 5 sessions  . Breast cancer ([place]) 2010  . Breast cancer ([place]) 2016    reoccurrence in scar    Past Surgical History  Procedure Laterality Date  . Tonsillectomy    . Right knee arthroscopy  2008  . Inguinal hernia repair      right side as infant  . Wisdom tooth extraction    . Cyst removed      left lower jaw - at age 61 yr  . Dilatation & currettage/hysteroscopy with resectocope N/A 09/16/2013    Procedure: DILATATION & CURETTAGE/HYSTEROSCOPY WITH RESECTOCOPE;  Surgeon: Princess Bruins, MD;  Location: [facility];  Service: Gynecology;  Laterality: N/A;  1 hr.  . Mastectomy  2010    bilateral mastectomies-lt snbx-radiation post  . Colonoscopy    . Breast lumpectomy Left 06/01/2014    Procedure: WIDE LOCAL EXCISION OF RECURRENT LEFT BREAST CANCER;  Surgeon: Rolm Bookbinder, MD;  Location: [facility];  Service: General;  Laterality: Left;  . Oophorectomy  2016    patient denies  . Mastectomy Bilateral   . Laparoscopic pelvic lymph node biopsy      Family History  Problem Relation Age of Onset  . Hypertension Mother   . Cancer Mother     Lung Cancer - long history of tobacco use  . Hypertension  Father   . Colon cancer Neg Hx   . Stomach cancer Neg Hx   . Stroke Maternal Grandfather   . Stroke Paternal Grandfather     Social History:  reports that she has never smoked. She has never used smokeless tobacco. She reports that she drinks alcohol. She reports that she does not use illicit drugs.  Allergies:  Allergies  Allergen Reactions  . Pollen Extract   . Adhesive [Tape] Rash    Prescriptions prior to admission  Medication Sig Dispense Refill Last Dose  . acetaminophen (TYLENOL) 500 MG tablet Take 500 mg by mouth every 6 (six) hours as needed for moderate pain or headache.   Taking  . acyclovir ointment (ZOVIRAX) 5 % Apply 1 application topically every 3 (three) hours. As needed 5 g 5 Taking  . anastrozole (ARIMIDEX) 1 MG tablet Take 1 tablet (1 mg total) by mouth daily. 90 tablet 4 03/13/2015 at 0800  . calcium carbonate (TUMS - DOSED IN MG ELEMENTAL CALCIUM) 500 MG chewable tablet Chew 4 tablets by mouth daily as needed (follow-up therapy to replace calcium after xgeva.).   Taking  . Calcium Carbonate-Vitamin D (OS-CAL 500 + D PO) Take 1 tablet by mouth daily.   Taking  . Cholecalciferol (VITAMIN D3) 2000 UNITS capsule Take 2,000 Units by mouth daily.     Taking  . Denosumab (XGEVA [place]) Inject 120 mg into the skin every 30 (thirty) days.  Last injection was 02/20/15.   Taking  . goserelin (ZOLADEX) 10.8 MG injection Inject 10.8 mg into the skin every 30 (thirty) days. Last dose on 02/20/15.   Taking  . ibuprofen (ADVIL,MOTRIN) 200 MG tablet Take 200 mg by mouth every 6 (six) hours as needed for moderate pain.   Taking  . levothyroxine (SYNTHROID, LEVOTHROID) 75 MCG tablet Take 1 tablet (75 mcg total) by mouth daily. 90 tablet 3 03/13/2015 at 0800  . loratadine (CLARITIN) 10 MG tablet Take 10 mg by mouth daily as needed for allergies.    Taking  . losartan (COZAAR) 100 MG tablet Take 1 tablet (100 mg total) by mouth daily. 90 tablet 4 03/13/2015 at 0800  . Polyethyl Glycol-Propyl  Glycol (SYSTANE OP) Apply 1 drop to eye as needed (for allergies).   Taking  . sodium chloride (OCEAN) 0.65 % SOLN nasal spray Place 1 spray into both nostrils as needed for congestion.   Taking    ROS Neg  Blood pressure 152/80, pulse 88, temperature 97.8 F (36.6 C), temperature source Oral, resp. rate 20, SpO2 98 %. Physical Exam   Pelvic US 05/2014 Ovaries wnl x 2.  No FF.  Small uterine myomas.  Results for orders placed or performed during the hospital encounter of 03/13/15 (from the past 24 hour(s))  Pregnancy, urine     Status: None   Collection Time: 03/13/15 11:15 AM  Result Value Ref Range   Preg Test, Ur NEGATIVE NEGATIVE    Assessment/Plan: Breast Ca for Robotic BSO, Peritoneal washings.  Surgery and risks reviewed.  Theona Muhs,MARIE-LYNE 03/13/2015, 1:02 PM

## 2015-03-13 NOTE — Anesthesia Postprocedure Evaluation (Signed)
Anesthesia Post Note  Patient: Erin [name]  Procedure(s) Performed: Procedure(s) (LRB): ROBOTIC ASSISTED BILATERAL SALPINGO OOPHORECTOMY With Washings (Bilateral)  Patient location during evaluation: PACU Anesthesia Type: General Level of consciousness: awake and alert Pain management: pain level controlled Vital Signs Assessment: post-procedure vital signs reviewed and stable Respiratory status: spontaneous breathing, nonlabored ventilation, respiratory function stable and patient connected to nasal cannula oxygen Cardiovascular status: blood pressure returned to baseline and stable Postop Assessment: No signs of nausea or vomiting Anesthetic complications: no    Last Vitals:  Filed Vitals:   03/13/15 1600 03/13/15 1615  BP: 117/66 119/73  Pulse: 57 64  Temp:  36.7 C  Resp: 12 14    Last Pain:  Filed Vitals:   03/13/15 1632  PainSc: 2                  Jerime Arif

## 2015-03-13 NOTE — Op Note (Signed)
03/13/2015  3:05 PM  PATIENT:  Erin [name]  54 y.o. female  PRE-OPERATIVE DIAGNOSIS:  Breast Cancer, Prophylactic Oophorectomy  POST-OPERATIVE DIAGNOSIS:  Breast Cancer, Prophylactic Oophorectomy  PROCEDURE:  Procedure(s): ROBOTIC ASSISTED BILATERAL SALPINGO OOPHORECTOMY With Peritoneal Washings  SURGEON:  Surgeon(s): Princess Bruins, MD  ASSISTANTS: Azucena Fallen   ANESTHESIA:   general   PROCEDURE:  Under general anesthesia with endotracheal intubation the patient is an lithotomy position. She is prepped with ChloraPrep on the abdomen and with Betadine and the suprapubic, vulvar and vaginal areas. She is draped as usual.  The Foley is inserted in the bladder.  The patient's hymen is intact.  A #6 Rumi without the Ko-ring are put in placed in the uterus.  Abdominally we infiltrate Marcaine one quarter plain at the supraumbilical area. We make a 1.5 cm incision with the scalpel at that level. We opened the aponeurosis with Mayo scissors under direct vision. The peritoneum is opened bluntly with the finger. A pursestring stitch of Vicryl 0 is done on the aponeurosis. The Sheryle Hail was introduced at that level and a pneumoperitoneum is created with CO2.  The camera is inserted. Inspection of the abdominal pelvic cavities reveals no abdominal abnormality, the liver and appendix are normal to inspection.  In the pelvis, the uterus, the tubes and both ovaries are normal.  No pelvic abnormality is present.  We use a semicircular configuration for port placement with 1 robotic port on the right and 1 robotic port on the left, the assistant port is placed at the upper left.  The skin is infiltrated with Marcaine one quarter plain at all sites and a small incision is made with a scalpel. We introduced all ports under direct vision.  We proceeded with peritoneal washings y laparoscopy.  The robot is docked on the right side.  The Endo Shears scissors are introduced in the right arm and the PK in the  left.  We go to the console.      Both ureters are visualized and normal anatomy position. Pictures were taken of all the structures previously inspected.  We started on the right side.  We cauterized in section the right infundibulopelvic ligament.  We cauterized and section the right tube at the proximal aspect and the utero-ovarian ligament.  The right tube and ovary are removed and put in the anterior cul-de-sac.  We proceeded exactly the same way on the left.  Hemostasis is adequate at all sites.  Robotic instruments were removed.  The robot is undocked.        We then proceeded with removal of the specimen by laparoscopy. An Endobag was introduced at the supraumbilical site and an eight mm camera was used from the right port.  Both specimens were introduced in the bag and removed through the supraumbilical port.  We irrigated and suctioned the abdominopelvic cavities and confirmed hemostasis once more.  Both ureters showed good peristalsis.  Bupivicaine was poured in the abdominopelvic cavity.  Laparoscopic instruments were removed.  The CO2 was evacuated.  The pursestring stitch was attached at the supraumbilical incision.  Hemostasis was completed with the Bovie were necessary at the incisions.  All incisions were closed with a subcuticular stitch of Vicryl 4-0.  The patient is allergic to glue so small dressings were applied on each incision.  The Foley was removed from the bladder.  The Rumi was removed from the uterus.  The patient was brought to recovery room in good and stable status.  ESTIMATED BLOOD LOSS:  5 cc   Intake/Output Summary (Last 24 hours) at 03/13/15 1505 Last data filed at 03/13/15 1445  Gross per 24 hour  Intake    900 ml  Output    108 ml  Net    792 ml     BLOOD ADMINISTERED:none   LOCAL MEDICATIONS USED:  MARCAINE    and BUPIVICAINE   SPECIMEN:  Source of Specimen:  Bilateral tubes and Ovaries.  Peritoneal Washings  DISPOSITION OF SPECIMEN:  PATHOLOGY  COUNTS:   YES  PLAN OF CARE: Transfer to PACU  Princess Bruins MD  03/13/2015 at 3:05 pm

## 2015-03-13 NOTE — Anesthesia Procedure Notes (Signed)
Procedure Name: Intubation Date/Time: 03/13/2015 1:33 PM Performed by: Flossie Dibble Pre-anesthesia Checklist: Patient being monitored, Suction available, Emergency Drugs available, Patient identified and Timeout performed Patient Re-evaluated:Patient Re-evaluated prior to inductionOxygen Delivery Method: Circle system utilized Preoxygenation: Pre-oxygenation with 100% oxygen Intubation Type: IV induction Ventilation: Mask ventilation without difficulty Laryngoscope Size: Mac and 3 Grade View: Grade II Tube type: Oral Tube size: 7.0 mm Number of attempts: 1 Airway Equipment and Method: Stylet Placement Confirmation: breath sounds checked- equal and bilateral,  ETT inserted through vocal cords under direct vision and positive ETCO2 Secured at: 21 cm Tube secured with: Tape Dental Injury: Teeth and Oropharynx as per pre-operative assessment

## 2015-03-13 NOTE — Anesthesia Preprocedure Evaluation (Signed)
Anesthesia Evaluation  Patient identified by MRN, date of birth, ID band Patient awake    Reviewed: Allergy & Precautions, H&P , Patient's Chart, lab work & pertinent test results  Airway Mallampati: II  TM Distance: >3 FB Neck ROM: full    Dental no notable dental hx. (+) Teeth Intact   Pulmonary neg pulmonary ROS,    Pulmonary exam normal        Cardiovascular hypertension, Pt. on medications Normal cardiovascular exam     Neuro/Psych negative neurological ROS  negative psych ROS   GI/Hepatic negative GI ROS, Neg liver ROS,   Endo/Other  Hypothyroidism Medicated  Renal/GU negative Renal ROS     Musculoskeletal   Abdominal Normal abdominal exam  (+)   Peds  Hematology negative hematology ROS (+)   Anesthesia Other Findings   Reproductive/Obstetrics negative OB ROS                             Anesthesia Physical Anesthesia Plan  ASA: II  Anesthesia Plan: General   Post-op Pain Management:    Induction: Intravenous  Airway Management Planned: Oral ETT  Additional Equipment:   Intra-op Plan:   Post-operative Plan: Extubation in OR  Informed Consent: I have reviewed the patients History and Physical, chart, labs and discussed the procedure including the risks, benefits and alternatives for the proposed anesthesia with the patient or authorized representative who has indicated his/her understanding and acceptance.   Dental Advisory Given  Plan Discussed with: CRNA, Anesthesiologist and Surgeon  Anesthesia Plan Comments:         Anesthesia Quick Evaluation

## 2015-03-13 NOTE — Transfer of Care (Signed)
Immediate Anesthesia Transfer of Care Note  Patient: Erin [name]  Procedure(s) Performed: Procedure(s): ROBOTIC ASSISTED BILATERAL SALPINGO OOPHORECTOMY With Washings (Bilateral)  Patient Location: PACU  Anesthesia Type:General  Level of Consciousness: awake, alert  and oriented  Airway & Oxygen Therapy: Patient Spontanous Breathing and Patient connected to nasal cannula oxygen  Post-op Assessment: Report given to RN and Post -op Vital signs reviewed and stable  Post vital signs: Reviewed and stable  Last Vitals:  Filed Vitals:   03/13/15 1124  BP: 152/80  Pulse: 88  Temp: 36.6 C  Resp: 20    Complications: No apparent anesthesia complications

## 2015-03-13 NOTE — Discharge Instructions (Signed)
Bilateral Salpingo-Oophorectomy, Care After Refer to this sheet in the next few weeks. These instructions provide you with information on caring for yourself after your procedure. Your health care provider may also give you more specific instructions. Your treatment has been planned according to current medical practices, but problems sometimes occur. Call your health care provider if you have any problems or questions after your procedure. WHAT TO EXPECT AFTER THE PROCEDURE After your procedure, it is typical to have the following:   Abdominal pain that can be controlled with medicine.  Vaginal spotting.  Constipation.  Menopausal symptoms such as hot flashes, vaginal dryness, and mood swings. HOME CARE INSTRUCTIONS   Get plenty of rest and sleep.  Only take over-the-counter or prescription medicines as directed by your health care provider. Do not take aspirin. It can cause bleeding.  Keep incision areas clean and dry. Remove or change bandages (dressings) only as directed by your health care provider.  Take showers instead of baths for a few weeks as directed by your health care provider.  Limit exercise and activities as directed by your health care provider. Do not lift anything heavier than 5 pounds (2.3 kg) until your health care provider approves.  Do not drive until your health care provider approves.  Follow your health care provider's advice regarding diet. You may be able to resume your usual diet right away.  Drink enough fluids to keep your urine clear or pale yellow.  Do not douche, use tampons, or have sexual intercourse for 6 weeks after the procedure.  Do not drink alcohol until your health care provider says it is okay.  Take your temperature twice a day and write it down.  If you become constipated, you may:  Ask your health care provider about taking a mild laxative.  Add more fruit and bran to your diet.  Drink more fluids.  Follow up with your health  care provider as directed. SEEK MEDICAL CARE IF:   You have swelling, redness, or increasing pain in the incision area.  You see pus coming from the incision area.  You notice a bad smell coming from the wound or dressing.  You have pain, redness, or swelling where the IV access tube was placed.  Your incision is breaking open (the edges are not staying together).  You feel dizzy or feel like fainting.  You develop pain or bleeding when you urinate.  You develop diarrhea.  You develop nausea and vomiting.  You develop abnormal vaginal discharge.  You develop a rash.  You have pain that is not controlled with medicine. SEEK IMMEDIATE MEDICAL CARE IF:   You develop a fever.  You develop abdominal pain.  You have chest pain.  You develop shortness of breath.  You pass out.  You develop pain, swelling, or redness in your leg.  You develop heavy vaginal bleeding with or without blood clots.   This information is not intended to replace advice given to you by your health care provider. Make sure you discuss any questions you have with your health care provider.   Document Released: 04/08/2005 Document Revised: 12/09/2012 Document Reviewed: 09/30/2012 Elsevier Interactive Patient Education [employer].

## 2015-03-14 ENCOUNTER — Encounter (HOSPITAL_COMMUNITY): Payer: Self-pay | Admitting: Obstetrics & Gynecology

## 2015-03-17 ENCOUNTER — Other Ambulatory Visit: Payer: BC Managed Care – PPO

## 2015-03-17 ENCOUNTER — Ambulatory Visit: Payer: BC Managed Care – PPO

## 2015-04-03 ENCOUNTER — Ambulatory Visit: Payer: BC Managed Care – PPO

## 2015-04-03 ENCOUNTER — Other Ambulatory Visit: Payer: BC Managed Care – PPO

## 2015-04-07 ENCOUNTER — Ambulatory Visit: Payer: BC Managed Care – PPO

## 2015-04-07 ENCOUNTER — Other Ambulatory Visit: Payer: Self-pay | Admitting: Adult Health

## 2015-04-07 ENCOUNTER — Other Ambulatory Visit (HOSPITAL_BASED_OUTPATIENT_CLINIC_OR_DEPARTMENT_OTHER): Payer: BC Managed Care – PPO

## 2015-04-07 ENCOUNTER — Ambulatory Visit (HOSPITAL_BASED_OUTPATIENT_CLINIC_OR_DEPARTMENT_OTHER): Payer: BC Managed Care – PPO

## 2015-04-07 ENCOUNTER — Ambulatory Visit: Payer: BC Managed Care – PPO | Admitting: Internal Medicine

## 2015-04-07 ENCOUNTER — Other Ambulatory Visit: Payer: BC Managed Care – PPO

## 2015-04-07 VITALS — BP 141/76 | HR 72 | Temp 97.7°F

## 2015-04-07 DIAGNOSIS — N8500 Endometrial hyperplasia, unspecified: Secondary | ICD-10-CM

## 2015-04-07 DIAGNOSIS — C50912 Malignant neoplasm of unspecified site of left female breast: Secondary | ICD-10-CM

## 2015-04-07 DIAGNOSIS — M8580 Other specified disorders of bone density and structure, unspecified site: Secondary | ICD-10-CM | POA: Diagnosis not present

## 2015-04-07 DIAGNOSIS — C50412 Malignant neoplasm of upper-outer quadrant of left female breast: Secondary | ICD-10-CM

## 2015-04-07 DIAGNOSIS — C50919 Malignant neoplasm of unspecified site of unspecified female breast: Secondary | ICD-10-CM

## 2015-04-07 LAB — COMPREHENSIVE METABOLIC PANEL
ALT: 10 U/L (ref 0–55)
AST: 16 U/L (ref 5–34)
Albumin: 3.9 g/dL (ref 3.5–5.0)
Alkaline Phosphatase: 63 U/L (ref 40–150)
Anion Gap: 9 mEq/L (ref 3–11)
BUN: 19.4 mg/dL (ref 7.0–26.0)
CO2: 24 meq/L (ref 22–29)
Calcium: 9.1 mg/dL (ref 8.4–10.4)
Chloride: 103 mEq/L (ref 98–109)
Creatinine: 0.8 mg/dL (ref 0.6–1.1)
EGFR: 81 mL/min/{1.73_m2} — ABNORMAL LOW (ref >90)
Glucose: 88 mg/dl (ref 70–140)
Potassium: 4.3 mEq/L (ref 3.5–5.1)
SODIUM: 135 meq/L — AB (ref 136–145)
TOTAL PROTEIN: 7 g/dL (ref 6.4–8.3)
Total Bilirubin: 1.61 mg/dL — ABNORMAL HIGH (ref 0.20–1.20)

## 2015-04-07 LAB — CBC WITH DIFFERENTIAL/PLATELET
BASO%: 0.9 % (ref 0.0–2.0)
Basophils Absolute: 0 10*3/uL (ref 0.0–0.1)
EOS%: 4.3 % (ref 0.0–7.0)
Eosinophils Absolute: 0.2 10*3/uL (ref 0.0–0.5)
HCT: 45.5 % (ref 34.8–46.6)
HGB: 15.2 g/dL (ref 11.6–15.9)
LYMPH%: 32.1 % (ref 14.0–49.7)
MCH: 28.6 pg (ref 25.1–34.0)
MCHC: 33.3 g/dL (ref 31.5–36.0)
MCV: 86 fL (ref 79.5–101.0)
MONO#: 0.4 10*3/uL (ref 0.1–0.9)
MONO%: 9 % (ref 0.0–14.0)
NEUT%: 53.7 % (ref 38.4–76.8)
NEUTROS ABS: 2.6 10*3/uL (ref 1.5–6.5)
PLATELETS: 210 10*3/uL (ref 145–400)
RBC: 5.29 10*6/uL (ref 3.70–5.45)
RDW: 13.1 % (ref 11.2–14.5)
WBC: 4.9 10*3/uL (ref 3.9–10.3)
lymph#: 1.6 10*3/uL (ref 0.9–3.3)

## 2015-04-07 LAB — LIPID PANEL
CHOL/HDL RATIO: 2.3 ratio (ref <=5.0)
CHOLESTEROL: 143 mg/dL (ref 125–200)
HDL: 61 mg/dL (ref >=46)
LDL Cholesterol: 67 mg/dL (ref <130)
TRIGLYCERIDES: 73 mg/dL (ref <150)
VLDL: 15 mg/dL (ref <30)

## 2015-04-07 MED ORDER — DENOSUMAB 120 MG/1.7ML ~~LOC~~ SOLN
120.0000 mg | Freq: Once | SUBCUTANEOUS | Status: AC
  Administered 2015-04-07: 120 mg via SUBCUTANEOUS
  Filled 2015-04-07: qty 1.7

## 2015-04-07 MED ORDER — GOSERELIN ACETATE 3.6 MG ~~LOC~~ IMPL: 3.6000 mg | DRUG_IMPLANT | Freq: Once | SUBCUTANEOUS | Status: DC

## 2015-04-08 LAB — HEMOGLOBIN A1C
Hgb A1c MFr Bld: 5.5 % (ref <5.7)
MEAN PLASMA GLUCOSE: 111 mg/dL (ref <117)

## 2015-04-12 ENCOUNTER — Other Ambulatory Visit: Payer: Self-pay

## 2015-04-12 DIAGNOSIS — C50412 Malignant neoplasm of upper-outer quadrant of left female breast: Secondary | ICD-10-CM

## 2015-04-12 DIAGNOSIS — C50912 Malignant neoplasm of unspecified site of left female breast: Secondary | ICD-10-CM

## 2015-04-12 NOTE — Progress Notes (Signed)
Per Benedetto Goad patient's insurance denied the PET scan that was scheduled on the 04/18/15.  An order for a CT scan of chest abdomen and pelvis has placed per Dr. [name].  Scheduling called and they will call and reschedule patient and cancel PET scan.

## 2015-04-13 ENCOUNTER — Encounter: Payer: Self-pay | Admitting: Adult Health

## 2015-04-13 ENCOUNTER — Ambulatory Visit (INDEPENDENT_AMBULATORY_CARE_PROVIDER_SITE_OTHER): Payer: BC Managed Care – PPO | Admitting: Adult Health

## 2015-04-13 VITALS — BP 120/82 | Temp 98.1°F | Wt 158.9 lb

## 2015-04-13 DIAGNOSIS — E559 Vitamin D deficiency, unspecified: Secondary | ICD-10-CM

## 2015-04-13 DIAGNOSIS — Z0000 Encounter for general adult medical examination without abnormal findings: Secondary | ICD-10-CM | POA: Diagnosis not present

## 2015-04-13 NOTE — Progress Notes (Signed)
Pre visit review using our clinic review tool, if applicable. No additional management support is needed unless otherwise documented below in the visit note. 

## 2015-04-13 NOTE — Progress Notes (Signed)
Subjective:    Patient ID: Erin [name], female    DOB: 01-24-1961, 54 y.o.   MRN: [id number]  HPI  Patient presents for yearly preventative medicine examination. She is a pleasant 54 year old female who  has a past medical history of Hypertension; Allergic rhinitis due to pollen; Osteopenia; Hypothyroidism; Wears glasses; S/P radiation therapy (07/12/2014 through 08/16/2014 ); Breast cancer ([place]) (2010); and Breast cancer ([place]) (2016).   All immunizations and health maintenance protocols were reviewed with the patient and needed orders were placed.  Appropriate screening laboratory values were ordered for the patient including screening of hyperlipidemia, renal function and hepatic function. If indicated by BPH, a PSA was ordered.  Medication reconciliation,  past medical history, social history, problem list and allergies were reviewed in detail with the patient  Goals were established with regard to weight loss, exercise, and  diet in compliance with medications   She had a Prophylactic Oophorectomy on 03/13/2015 and reports no post surgical issues.   She next follows up with Oncology for breast cancer in January at which time she will have a PET scan done.   She has no complaints at this time.  Review of Systems  Constitutional: Negative.   HENT: Negative.   Eyes: Negative.   Respiratory: Negative.   Cardiovascular: Negative.   Gastrointestinal: Negative.   Endocrine: Negative.   Genitourinary: Negative.   Musculoskeletal: Negative.   Skin: Negative.   Allergic/Immunologic: Negative.   Neurological: Negative.   Hematological: Negative.   Psychiatric/Behavioral: Negative.   All other systems reviewed and are negative.      Objective:   Physical Exam  Constitutional: She is oriented to person, place, and time. She appears well-developed and well-nourished. No distress.  HENT:  Head: Normocephalic and  atraumatic.  Right Ear: External ear normal.  Left Ear: External ear normal.  Nose: Nose normal.  Mouth/Throat: Oropharynx is clear and moist. No oropharyngeal exudate.  Eyes: Conjunctivae and EOM are normal. Pupils are equal, round, and reactive to light. Right eye exhibits no discharge. Left eye exhibits no discharge. No scleral icterus.  Neck: Normal range of motion. Neck supple. No JVD present. No tracheal deviation present. No thyromegaly present.  Cardiovascular: Normal rate, regular rhythm, normal heart sounds and intact distal pulses.  Exam reveals no gallop and no friction rub.   No murmur heard. Pulmonary/Chest: Effort normal and breath sounds normal. No stridor. No respiratory distress. She has no wheezes. She has no rales. She exhibits no tenderness.  Abdominal: Soft. Bowel sounds are normal. She exhibits no distension and no mass. There is no tenderness. There is no rebound and no guarding.  Genitourinary:  Deferred  Musculoskeletal: Normal range of motion. She exhibits no edema or tenderness.  Lymphadenopathy:    She has no cervical adenopathy.  Neurological: She is alert and oriented to person, place, and time. She has normal reflexes. She displays normal reflexes. No cranial nerve deficit. She exhibits normal muscle tone. Coordination normal.  Skin: Skin is warm and dry. No rash noted. She is not diaphoretic. No erythema. No pallor.  Well healing wounds on abdomen from recent oophorectomy.   Psychiatric: She has a normal mood and affect. Her behavior is normal. Judgment and thought content normal.  Nursing note and vitals reviewed.     Assessment & Plan:  1. Routine general medical examination at a health care facility - Reviewed labs with patient.  - She looks like she is doing well s/p surgery.  - She is  eating healthy - Her blood pressure is normotensive today in the office. I will have her keep a log and check her BP in the morning and at night , three times a week.  She is to let me know if her BP is consistently above 150.  - Will consider changing medications.  - Follow up in 6 months or sooner if needed  2. Vitamin D deficiency - Vitamin D, 25-hydroxy; Future

## 2015-04-13 NOTE — Patient Instructions (Addendum)
It was great seeing you today!  As discussed, start monitoring your blood pressure three times a day and let me know if they are consistently above 150.   Follow up with me in 6 months or sooner if needed.     Health Maintenance, Female Adopting a healthy lifestyle and getting preventive care can go a long way to promote health and wellness. Talk with your health care provider about what schedule of regular examinations is right for you. This is a good chance for you to check in with your provider about disease prevention and staying healthy. In between checkups, there are plenty of things you can do on your own. Experts have done a lot of research about which lifestyle changes and preventive measures are most likely to keep you healthy. Ask your health care provider for more information. WEIGHT AND DIET  Eat a healthy diet  Be sure to include plenty of vegetables, fruits, low-fat dairy products, and lean protein.  Do not eat a lot of foods high in solid fats, added sugars, or salt.  Get regular exercise. This is one of the most important things you can do for your health.  Most adults should exercise for at least 150 minutes each week. The exercise should increase your heart rate and make you sweat (moderate-intensity exercise).  Most adults should also do strengthening exercises at least twice a week. This is in addition to the moderate-intensity exercise.  Maintain a healthy weight  Body mass index (BMI) is a measurement that can be used to identify possible weight problems. It estimates body fat based on height and weight. Your health care provider can help determine your BMI and help you achieve or maintain a healthy weight.  For females 86 years of age and older:   A BMI below 18.5 is considered underweight.  A BMI of 18.5 to 24.9 is normal.  A BMI of 25 to 29.9 is considered overweight.  A BMI of 30 and above is considered obese.  Watch levels of cholesterol and blood  lipids  You should start having your blood tested for lipids and cholesterol at 53 years of age, then have this test every 5 years.  You may need to have your cholesterol levels checked more often if:  Your lipid or cholesterol levels are high.  You are older than 53 years of age.  You are at high risk for heart disease.  CANCER SCREENING   Lung Cancer  Lung cancer screening is recommended for adults 53-71 years old who are at high risk for lung cancer because of a history of smoking.  A yearly low-dose CT scan of the lungs is recommended for people who:  Currently smoke.  Have quit within the past 15 years.  Have at least a 30-pack-year history of smoking. A pack year is smoking an average of one pack of cigarettes a day for 1 year.  Yearly screening should continue until it has been 15 years since you quit.  Yearly screening should stop if you develop a health problem that would prevent you from having lung cancer treatment.  Breast Cancer  Practice breast self-awareness. This means understanding how your breasts normally appear and feel.  It also means doing regular breast self-exams. Let your health care provider know about any changes, no matter how small.  If you are in your 20s or 30s, you should have a clinical breast exam (CBE) by a health care provider every 1-3 years as part of a regular  health exam.  If you are 40 or older, have a CBE every year. Also consider having a breast X-ray (mammogram) every year.  If you have a family history of breast cancer, talk to your health care provider about genetic screening.  If you are at high risk for breast cancer, talk to your health care provider about having an MRI and a mammogram every year.  Breast cancer gene (BRCA) assessment is recommended for women who have family members with BRCA-related cancers. BRCA-related cancers include:  Breast.  Ovarian.  Tubal.  Peritoneal cancers.  Results of the assessment  will determine the need for genetic counseling and BRCA1 and BRCA2 testing. Cervical Cancer Your health care provider may recommend that you be screened regularly for cancer of the pelvic organs (ovaries, uterus, and vagina). This screening involves a pelvic examination, including checking for microscopic changes to the surface of your cervix (Pap test). You may be encouraged to have this screening done every 3 years, beginning at age 53.  For women ages 66-65, health care providers may recommend pelvic exams and Pap testing every 3 years, or they may recommend the Pap and pelvic exam, combined with testing for human papilloma virus (HPV), every 5 years. Some types of HPV increase your risk of cervical cancer. Testing for HPV may also be done on women of any age with unclear Pap test results.  Other health care providers may not recommend any screening for nonpregnant women who are considered low risk for pelvic cancer and who do not have symptoms. Ask your health care provider if a screening pelvic exam is right for you.  If you have had past treatment for cervical cancer or a condition that could lead to cancer, you need Pap tests and screening for cancer for at least 20 years after your treatment. If Pap tests have been discontinued, your risk factors (such as having a new sexual partner) need to be reassessed to determine if screening should resume. Some women have medical problems that increase the chance of getting cervical cancer. In these cases, your health care provider may recommend more frequent screening and Pap tests. Colorectal Cancer  This type of cancer can be detected and often prevented.  Routine colorectal cancer screening usually begins at 53 years of age and continues through 53 years of age.  Your health care provider may recommend screening at an earlier age if you have risk factors for colon cancer.  Your health care provider may also recommend using home test kits to check  for hidden blood in the stool.  A small camera at the end of a tube can be used to examine your colon directly (sigmoidoscopy or colonoscopy). This is done to check for the earliest forms of colorectal cancer.  Routine screening usually begins at age 72.  Direct examination of the colon should be repeated every 5-10 years through 53 years of age. However, you may need to be screened more often if early forms of precancerous polyps or small growths are found. Skin Cancer  Check your skin from head to toe regularly.  Tell your health care provider about any new moles or changes in moles, especially if there is a change in a mole's shape or color.  Also tell your health care provider if you have a mole that is larger than the size of a pencil eraser.  Always use sunscreen. Apply sunscreen liberally and repeatedly throughout the day.  Protect yourself by wearing long sleeves, pants, a wide-brimmed hat, and  sunglasses whenever you are outside. HEART DISEASE, DIABETES, AND HIGH BLOOD PRESSURE   High blood pressure causes heart disease and increases the risk of stroke. High blood pressure is more likely to develop in:  People who have blood pressure in the high end of the normal range (130-139/85-89 mm Hg).  People who are overweight or obese.  People who are African American.  If you are 13-40 years of age, have your blood pressure checked every 3-5 years. If you are 65 years of age or older, have your blood pressure checked every year. You should have your blood pressure measured twice--once when you are at a hospital or clinic, and once when you are not at a hospital or clinic. Record the average of the two measurements. To check your blood pressure when you are not at a hospital or clinic, you can use:  An automated blood pressure machine at a pharmacy.  A home blood pressure monitor.  If you are between 55 years and 85 years old, ask your health care provider if you should take  aspirin to prevent strokes.  Have regular diabetes screenings. This involves taking a blood sample to check your fasting blood sugar level.  If you are at a normal weight and have a low risk for diabetes, have this test once every three years after 53 years of age.  If you are overweight and have a high risk for diabetes, consider being tested at a younger age or more often. PREVENTING INFECTION  Hepatitis B  If you have a higher risk for hepatitis B, you should be screened for this virus. You are considered at high risk for hepatitis B if:  You were born in a country where hepatitis B is common. Ask your health care provider which countries are considered high risk.  Your parents were born in a high-risk country, and you have not been immunized against hepatitis B (hepatitis B vaccine).  You have HIV or AIDS.  You use needles to inject street drugs.  You live with someone who has hepatitis B.  You have had sex with someone who has hepatitis B.  You get hemodialysis treatment.  You take certain medicines for conditions, including cancer, organ transplantation, and autoimmune conditions. Hepatitis C  Blood testing is recommended for:  Everyone born from [birth year] through 1964.  Anyone with known risk factors for hepatitis C. Sexually transmitted infections (STIs)  You should be screened for sexually transmitted infections (STIs) including gonorrhea and chlamydia if:  You are sexually active and are younger than 53 years of age.  You are older than 53 years of age and your health care provider tells you that you are at risk for this type of infection.  Your sexual activity has changed since you were last screened and you are at an increased risk for chlamydia or gonorrhea. Ask your health care provider if you are at risk.  If you do not have HIV, but are at risk, it may be recommended that you take a prescription medicine daily to prevent HIV infection. This is called  pre-exposure prophylaxis (PrEP). You are considered at risk if:  You are sexually active and do not regularly use condoms or know the HIV status of your partner(s).  You take drugs by injection.  You are sexually active with a partner who has HIV. Talk with your health care provider about whether you are at high risk of being infected with HIV. If you choose to begin PrEP, you should first  be tested for HIV. You should then be tested every 3 months for as long as you are taking PrEP.  PREGNANCY   If you are premenopausal and you may become pregnant, ask your health care provider about preconception counseling.  If you may become pregnant, take 400 to 800 micrograms (mcg) of folic acid every day.  If you want to prevent pregnancy, talk to your health care provider about birth control (contraception). OSTEOPOROSIS AND MENOPAUSE   Osteoporosis is a disease in which the bones lose minerals and strength with aging. This can result in serious bone fractures. Your risk for osteoporosis can be identified using a bone density scan.  If you are 42 years of age or older, or if you are at risk for osteoporosis and fractures, ask your health care provider if you should be screened.  Ask your health care provider whether you should take a calcium or vitamin D supplement to lower your risk for osteoporosis.  Menopause may have certain physical symptoms and risks.  Hormone replacement therapy may reduce some of these symptoms and risks. Talk to your health care provider about whether hormone replacement therapy is right for you.  HOME CARE INSTRUCTIONS   Schedule regular health, dental, and eye exams.  Stay current with your immunizations.   Do not use any tobacco products including cigarettes, chewing tobacco, or electronic cigarettes.  If you are pregnant, do not drink alcohol.  If you are breastfeeding, limit how much and how often you drink alcohol.  Limit alcohol intake to no more than 1  drink per day for nonpregnant women. One drink equals 12 ounces of beer, 5 ounces of wine, or 1 ounces of hard liquor.  Do not use street drugs.  Do not share needles.  Ask your health care provider for help if you need support or information about quitting drugs.  Tell your health care provider if you often feel depressed.  Tell your health care provider if you have ever been abused or do not feel safe at home.   This information is not intended to replace advice given to you by your health care provider. Make sure you discuss any questions you have with your health care provider.   Document Released: 10/22/2010 Document Revised: 04/29/2014 Document Reviewed: 03/10/2013 [employer] Interactive Patient Education [employer].

## 2015-04-14 ENCOUNTER — Other Ambulatory Visit: Payer: BC Managed Care – PPO

## 2015-04-14 ENCOUNTER — Ambulatory Visit: Payer: BC Managed Care – PPO

## 2015-04-18 ENCOUNTER — Ambulatory Visit (HOSPITAL_COMMUNITY): Payer: BC Managed Care – PPO

## 2015-04-19 ENCOUNTER — Encounter (HOSPITAL_COMMUNITY): Payer: Self-pay

## 2015-04-19 ENCOUNTER — Other Ambulatory Visit: Payer: Self-pay | Admitting: Nurse Practitioner

## 2015-04-19 ENCOUNTER — Other Ambulatory Visit: Payer: Self-pay | Admitting: Oncology

## 2015-04-19 ENCOUNTER — Ambulatory Visit (HOSPITAL_COMMUNITY)
Admission: RE | Admit: 2015-04-19 | Discharge: 2015-04-19 | Disposition: A | Discharge status: Home / Self Care | Payer: BC Managed Care – PPO | Source: Ambulatory Visit | Attending: Oncology | Admitting: Oncology

## 2015-04-19 DIAGNOSIS — Z9013 Acquired absence of bilateral breasts and nipples: Secondary | ICD-10-CM | POA: Diagnosis not present

## 2015-04-19 DIAGNOSIS — C50912 Malignant neoplasm of unspecified site of left female breast: Secondary | ICD-10-CM

## 2015-04-19 DIAGNOSIS — C50412 Malignant neoplasm of upper-outer quadrant of left female breast: Secondary | ICD-10-CM | POA: Diagnosis present

## 2015-04-19 DIAGNOSIS — R918 Other nonspecific abnormal finding of lung field: Secondary | ICD-10-CM | POA: Diagnosis not present

## 2015-04-19 DIAGNOSIS — M899 Disorder of bone, unspecified: Secondary | ICD-10-CM | POA: Insufficient documentation

## 2015-04-19 MED ORDER — IOHEXOL 300 MG/ML  SOLN
100.0000 mL | Freq: Once | INTRAMUSCULAR | Status: AC | PRN
  Administered 2015-04-19: 100 mL via INTRAVENOUS

## 2015-04-25 ENCOUNTER — Other Ambulatory Visit (HOSPITAL_BASED_OUTPATIENT_CLINIC_OR_DEPARTMENT_OTHER): Payer: BC Managed Care – PPO

## 2015-04-25 ENCOUNTER — Ambulatory Visit (HOSPITAL_BASED_OUTPATIENT_CLINIC_OR_DEPARTMENT_OTHER): Payer: BC Managed Care – PPO | Admitting: Oncology

## 2015-04-25 ENCOUNTER — Telehealth: Payer: Self-pay | Admitting: Oncology

## 2015-04-25 VITALS — BP 145/75 | HR 75 | Temp 98.2°F | Resp 17 | Ht 65.0 in | Wt 161.7 lb

## 2015-04-25 DIAGNOSIS — C50919 Malignant neoplasm of unspecified site of unspecified female breast: Secondary | ICD-10-CM

## 2015-04-25 DIAGNOSIS — M8580 Other specified disorders of bone density and structure, unspecified site: Secondary | ICD-10-CM

## 2015-04-25 DIAGNOSIS — Z170 Estrogen receptor positive status [ER+]: Secondary | ICD-10-CM | POA: Diagnosis not present

## 2015-04-25 DIAGNOSIS — C50412 Malignant neoplasm of upper-outer quadrant of left female breast: Secondary | ICD-10-CM

## 2015-04-25 DIAGNOSIS — C50912 Malignant neoplasm of unspecified site of left female breast: Secondary | ICD-10-CM

## 2015-04-25 LAB — CBC WITH DIFFERENTIAL/PLATELET
BASO%: 1.1 % (ref 0.0–2.0)
BASOS ABS: 0.1 10*3/uL (ref 0.0–0.1)
EOS%: 3.6 % (ref 0.0–7.0)
Eosinophils Absolute: 0.2 10*3/uL (ref 0.0–0.5)
HEMATOCRIT: 43.5 % (ref 34.8–46.6)
HEMOGLOBIN: 14.6 g/dL (ref 11.6–15.9)
LYMPH%: 30.1 % (ref 14.0–49.7)
MCH: 28.7 pg (ref 25.1–34.0)
MCHC: 33.4 g/dL (ref 31.5–36.0)
MCV: 86 fL (ref 79.5–101.0)
MONO#: 0.4 10*3/uL (ref 0.1–0.9)
MONO%: 8.1 % (ref 0.0–14.0)
NEUT#: 2.9 10*3/uL (ref 1.5–6.5)
NEUT%: 57.1 % (ref 38.4–76.8)
PLATELETS: 193 10*3/uL (ref 145–400)
RBC: 5.06 10*6/uL (ref 3.70–5.45)
RDW: 13.4 % (ref 11.2–14.5)
WBC: 5.1 10*3/uL (ref 3.9–10.3)
lymph#: 1.5 10*3/uL (ref 0.9–3.3)

## 2015-04-25 LAB — COMPREHENSIVE METABOLIC PANEL
ALBUMIN: 3.7 g/dL (ref 3.5–5.0)
ALK PHOS: 59 U/L (ref 40–150)
ALT: 10 U/L (ref 0–55)
AST: 15 U/L (ref 5–34)
Anion Gap: 8 mEq/L (ref 3–11)
BUN: 19.5 mg/dL (ref 7.0–26.0)
CALCIUM: 8.9 mg/dL (ref 8.4–10.4)
CO2: 23 mEq/L (ref 22–29)
CREATININE: 0.8 mg/dL (ref 0.6–1.1)
Chloride: 105 mEq/L (ref 98–109)
EGFR: 80 mL/min/{1.73_m2} — ABNORMAL LOW (ref >90)
Glucose: 105 mg/dl (ref 70–140)
Potassium: 4.3 mEq/L (ref 3.5–5.1)
Sodium: 136 mEq/L (ref 136–145)
Total Bilirubin: 1.16 mg/dL (ref 0.20–1.20)
Total Protein: 6.6 g/dL (ref 6.4–8.3)

## 2015-04-25 NOTE — Telephone Encounter (Signed)
Appointments made and avs printed for patient °

## 2015-04-25 NOTE — Progress Notes (Signed)
Erin [name]  MR#: [id number]    PCP: Erin Pry, DO GYN: Erin [name] SU: Erin [name] OTHER MD: Erin [name], [address], [place] Erin [name], Erin [name], Erin [name], [place] Erin [name] CHIEF COMPLAINT: Recurrent breast cancer  CURRENT THERAPY: anastrozole; denosumab    BREAST CANCER HISTORY From the original intake note 04/07/2011:  Ms. Erin [name] had routine screening mammography December of 2009 at [facility] in [place], [place], showing an increasing asymmetry in her left breast. She was recalled for additional views December 29. She was noted to have heterogeneously dense breasts. It was a 5 cm area of architectural distortion in the lateral aspect of the left breast with no associated calcifications. There was a second lesion medial to this. Ultrasound showed a highly suspicious hypoechoic irregularly marginated mass measuring 3 cm at the 2:30 position 5 cm from the nipple. This was palpable to the mammographer. The second area in question I measured 7 mm and a third lesion was noted measuring 5 mm. Some left axillary lymph nodes were morphologically normal.  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 Bilateral breast MRIs were performed 05/04/2008 and showed, in the left breast, a large lobulated mass measuring up to 4.3 cm, and including both of the apparently separate masses the previously biopsied. In the right breast there were 2 indeterminate lesions. These were evaluated further with breast specific gamma imaging performed January 14 in both lesions were negative. The lesion in the left breast was markedly abnormal.  Given this complex history and with the background of significant breast density, the patient opted for bilateral mastectomies with left sentinel lymph node sampling. This was performed of 06/27/2008 and  showed([id number]) in the right breast, no malignancy. In the left breast there was a 3.2 cm invasive ductal carcinoma, grade 1, focally extending to the anterior margin of the lower outer quadrant. There was extensive angiolymphatic invasion, but both sentinel lymph nodes on the left were negative.  The patient received left-sided postmastectomy radiation including of the left chest wall and left supraclavicular fossa to a total dose of 50.4 Gy plus a 10 Gy scar boost. She had an Oncotype DX recurrence score of 17, further discussed below. She decided to forego reconstruction. She was tested for BRCA1 and 2 and was found to be negative. Given her overall prognosis, she did not receive chemotherapy, but started tamoxifen February of 2010, with good tolerance.  RECURRENT DISEASE: INITIAL SUMMARY Erin [name] took tamoxifen for 5 years with no evidence of active disease area did in May 2015 we decided to stop the tamoxifen because of problems with endometrial hyperplasia. 6 months later, at the November visit, she was found to have a change in a small area of scaring her left inframammary fold. She was referred to dermatology and biopsy 04/21/2014 showed ([id number]) recurrent ductal adenocarcinoma (positive for gross cystic disease fluid protein, estrogen, and progesterone). She then underwent a staging PET scan in [facility] 05/06/2014. This showed a 1.9 cm sclerotic lesion in the right ischial tuberosity, with a maximum standard uptake value of 4.6. A sclerotic lesion in the left side of the L3 vertebral body measuring 1.6 cm had no hypermetabolic activity.  Her subsequent history is as detailed below.  INTERVAL HISTORY: Erin [name] returns today for follow-up of her estrogen receptor positive breast cancer. Since her last visit here she underwent bilateral salpingo-oophorectomy, 03/13/2015,  with benign pathology (SZD [phone]). She is now getting back to her usual exercise routine.  She continues on  anastrozole, with good tolerance. Since she went off the goserelin her blood pressure appears to have improved. She has dated the oral contrast she had to take for the recent studies, but we could not get a PET approved for her so we had to do CTs of the abdomen and pelvis.  This cancer very favorable. She has no areas of disease outside of the issue else tuberosity area, which was previously biopsied and was negative. We do not know that this was a metastatic deposit. It could've been reactive. She has a little bit more tissue in the lateral chest wall, where she had extensive surgery previously. This will require follow-up. All this is very favorable  REVIEWOF SYSTEMS: She is having problems with TMJ and has had 2 or 3 mouth guards with no success. Her dentist also thought she might be having it "TIA" because her tongue seem to be deviating to the right a little. She suggested a cardiology referral. She was more appropriately referred to neurology by Dr. Dellis [name] and Dr. Rexene [name] did not find any pathology warranting further evaluation. Aside from these issues a detailed review of systems today was benign.  Past Medical History  Diagnosis Date  . Hypertension   . Allergic rhinitis due to pollen   . Osteopenia   . Hypothyroidism   . Wears glasses   . S/P radiation therapy 07/12/2014 through 08/16/2014     Left chest wall 4000 cGy in 20 sessions, left chest wall boost 1000 cGy in 5 sessions  . Breast cancer ([place]) 2010  . Breast cancer ([place]) 2016    reoccurrence in scar  Diverticulosis       Past Surgical History  Procedure Laterality Date  . Tonsillectomy    . Right knee arthroscopy  2008  . Inguinal hernia repair      right side as infant  . Wisdom tooth extraction    . Cyst removed      left lower jaw - at age 64 yr  . Dilatation & currettage/hysteroscopy with resectocope N/A 09/16/2013    Procedure: DILATATION & CURETTAGE/HYSTEROSCOPY  WITH RESECTOCOPE;  Surgeon: Erin Bruins, MD;  Location: [facility];  Service: Gynecology;  Laterality: N/A;  1 hr.  . Mastectomy  2010    bilateral mastectomies-lt snbx-radiation post  . Colonoscopy    . Breast lumpectomy Left 06/01/2014    Procedure: WIDE LOCAL EXCISION OF RECURRENT LEFT BREAST CANCER;  Surgeon: Erin Bookbinder, MD;  Location: [facility];  Service: General;  Laterality: Left;  . Oophorectomy  2016    patient denies  . Mastectomy Bilateral   . Laparoscopic pelvic lymph node biopsy    . Robotic assisted bilateral salpingo oopherectomy Bilateral 03/13/2015    Procedure: ROBOTIC ASSISTED BILATERAL SALPINGO OOPHORECTOMY With Washings;  Surgeon: Erin Bruins, MD;  Location: [facility];  Service: Gynecology;  Laterality: Bilateral;    FAMILY HISTORY The patient's mother died in 2458 from complications of lung cancer. The patient has not been in touch with her father her for approximately 30 years. She had no sisters, 1 brother, who is in good health. There is no breast or ovarian cancer in the family to her knowledge.  GYNECOLOGIC HISTORY:  GX P0, menarche at around age 72, the patient continued to have periods despite being on tamoxifen, although more irregularly. 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. She lives alone and has no pets. Her work involves quite a bit of travel.     ADVANCED DIRECTIVES: not in place  HEALTH MAINTENANCE:      Social History  Substance Use Topics  . Smoking status: Never Smoker   . Smokeless tobacco: Never Used  . Alcohol Use: 0.0 oz/week    0 Standard drinks or equivalent per week     Comment: rare      Colonoscopy: December 2013 Erin [name])  PAP: Feb 2011  Bone density: 06/2009, normal  Cholesterol: "good"      Allergies  Allergen Reactions  . Pollen Extract   . Adhesive [Tape] Rash    MEDICATIONS:    Current Outpatient Prescriptions  Medication Sig Dispense Refill  .  acetaminophen (TYLENOL) 500 MG tablet Take 500 mg by mouth every 6 (six) hours as needed for moderate pain or headache.    Marland Kitchen acyclovir ointment (ZOVIRAX) 5 % Apply 1 application topically every 3 (three) hours. As needed 5 g 5  . anastrozole (ARIMIDEX) 1 MG tablet Take 1 tablet (1 mg total) by mouth daily. 90 tablet 4  . calcium carbonate (TUMS - DOSED IN MG ELEMENTAL CALCIUM) 500 MG chewable tablet Chew 4 tablets by mouth daily as needed (follow-up therapy to replace calcium after xgeva.).    Marland Kitchen Calcium Carbonate-Vitamin D (OS-CAL 500 + D PO) Take 1 tablet by mouth daily.    . Cholecalciferol (VITAMIN D3) 2000 UNITS capsule Take 2,000 Units by mouth daily.      . Denosumab (XGEVA [place]) Inject 120 mg into the skin every 30 (thirty) days. Last injection was 02/20/15.    . ibuprofen (ADVIL,MOTRIN) 200 MG tablet Take 200 mg by mouth every 6 (six) hours as needed for moderate pain.    Marland Kitchen levothyroxine (SYNTHROID, LEVOTHROID) 75 MCG tablet Take 1 tablet (75 mcg total) by mouth daily. 90 tablet 3  . loratadine (CLARITIN) 10 MG tablet Take 10 mg by mouth daily as needed for allergies.     Marland Kitchen losartan (COZAAR) 100 MG tablet Take 1 tablet (100 mg total) by mouth daily. 90 tablet 4  . Polyethyl Glycol-Propyl Glycol (SYSTANE OP) Apply 1 drop to eye as needed (for allergies).    . sodium chloride (OCEAN) 0.65 % SOLN nasal spray Place 1 spray into both nostrils as needed for congestion.     No current facility-administered medications for this visit.    OBJECTIVE:  Middle-aged white woman who appears well    Filed Vitals:   04/25/15 0816  BP: 145/75  Pulse: 75  Temp: 98.2 F (36.8 C)  Resp: 17     Body mass index is 26.91 kg/(m^2).     ECOG performance status: 0  Sclerae unicteric, EOMs intact Oropharynx clear, dentition in good repair, no obvious tongue pathology No cervical or supraclavicular adenopathy Lungs no rales or rhonchi Heart regular rate and rhythm Abd soft, nontender, positive bowel  sounds MSK no focal spinal tenderness, no upper extremity lymphedema Neuro: nonfocal, well oriented, appropriate affect Breasts: Status post bilateral mastectomies. Careful palpation of the left chest wall, where the patient had surgery and radiation (twice) reveals no suspicious finding.  LABS:           Chemistry      Component Value Date/Time   NA 135* 04/07/2015 0803   NA 136 08/10/2014 1008   K 4.3 04/07/2015 0803   K 4.3 08/10/2014 1008   CL 101 08/10/2014 1008   CL 105 08/10/2012  0944   CO2 24 04/07/2015 0803   CO2 30 08/10/2014 1008   BUN 19.4 04/07/2015 0803   BUN 13 08/10/2014 1008   CREATININE 0.8 04/07/2015 0803   CREATININE 0.76 08/10/2014 1008      Component Value Date/Time   CALCIUM 9.1 04/07/2015 0803   CALCIUM 9.3 08/10/2014 1008   ALKPHOS 63 04/07/2015 0803   ALKPHOS 59 02/08/2014 0834   AST 16 04/07/2015 0803   AST 17 02/08/2014 0834   ALT 10 04/07/2015 0803   ALT 14 02/08/2014 0834   BILITOT 1.61* 04/07/2015 0803   BILITOT 0.9 02/08/2014 0834         Lab Results  Component Value Date   WBC 5.1 04/25/2015   HGB 14.6 04/25/2015   HCT 43.5 04/25/2015   MCV 86.0 04/25/2015   PLT 193 04/25/2015   NEUTROABS 2.9 04/25/2015    STUDIES  Ct Chest W Contrast  04/19/2015  CLINICAL DATA:  Prior double mastectomy in 2010 with recurrence along left scar tissue in January 2015. Prior PET scan showed hypermetabolic activity in the pelvis but biopsy came back negative and subsequent PET-CT demonstrated reduced hypermetabolism. EXAM: CT CHEST, ABDOMEN, AND PELVIS WITH CONTRAST TECHNIQUE: Multidetector CT imaging of the chest, abdomen and pelvis was performed following the standard protocol during bolus administration of intravenous contrast. CONTRAST:  140m OMNIPAQUE IOHEXOL 300 MG/ML  SOLN COMPARISON:  Multiple exams, including 12/20/2014 and 05/06/2014 FINDINGS: CT CHEST FINDINGS Mediastinum/Nodes: No pathologic adenopathy or significant vascular abnormality  identified. In the subcutaneous tissues along the left mastectomy site there is a 3.8 by 0.8 by 4.4 cm soft tissue density likely from scarring, without overt nodularity, but asymmetric to the contralateral side. When I compare this to 05/06/2014, the soft tissue density in this vicinity looks thicker, and was previously more bandlike and only about 2-3 mm in thickness. Lungs/Pleura: Indistinct hazy opacity at the left lung apex possibly related to chronic scarring and unchanged from 05/06/2014. 2 by 4 mm left lower lobe nodule on image 30 series 4, retrospectively unchanged from 05/06/2014. Musculoskeletal: Unremarkable CT ABDOMEN PELVIS FINDINGS Hepatobiliary: Unremarkable Pancreas: Unremarkable Spleen: Unremarkable Adrenals/Urinary Tract: Unremarkable Stomach/Bowel: Prominent stool throughout the colon favors constipation. Vascular/Lymphatic: Small pelvic lymph nodes are not pathologically enlarged by size criteria. No significant atheromatous calcification. Reproductive: Unremarkable Other: No supplemental non-categorized findings. Musculoskeletal: Focal sclerosis in the right ischial tuberosity in the vicinity of the previously biopsied structure. Stable left paracentral sclerosis in the L3 vertebral body, without any activity on prior PET-CT, bone scan, or visible lesion in this vicinity on the prior MRI of January 2016, likely incidental. IMPRESSION: 1. Along the left lateral mastectomy site, there is some increased thickening of the subcutaneous scar-like appearance in this vicinity compared to 05/06/14. Has the patient had interval procedural intervention in this region since January? This could certainly be explained by some additional scarring, and there is no focal nodularity to further suggest malignancy, but the increase in thickness of the flat subcutaneous density in this region merits observation. 2. Stable sclerosis inferiorly in the right ischial tuberosity, previously biopsied with only benign  results, and with reducing conspicuity on bone scan. This would seem to suggest that this sclerotic lesion does not harbor active malignancy and may simply be reactive to the adjacent ischial tuberosity. Compared to the CT from 05/06/2014, there is some increased sclerosis, but this may be a related to the interval biopsy. 3. Hazy opacity at the left lung apex likely from scarring, stable. Electronically Signed  By: Van Clines M.D.   On: 04/19/2015 16:09   Ct Abdomen Pelvis W Contrast  04/19/2015  CLINICAL DATA:  Prior double mastectomy in 2010 with recurrence along left scar tissue in January 2015. Prior PET scan showed hypermetabolic activity in the pelvis but biopsy came back negative and subsequent PET-CT demonstrated reduced hypermetabolism. EXAM: CT CHEST, ABDOMEN, AND PELVIS WITH CONTRAST TECHNIQUE: Multidetector CT imaging of the chest, abdomen and pelvis was performed following the standard protocol during bolus administration of intravenous contrast. CONTRAST:  143m OMNIPAQUE IOHEXOL 300 MG/ML  SOLN COMPARISON:  Multiple exams, including 12/20/2014 and 05/06/2014 FINDINGS: CT CHEST FINDINGS Mediastinum/Nodes: No pathologic adenopathy or significant vascular abnormality identified. In the subcutaneous tissues along the left mastectomy site there is a 3.8 by 0.8 by 4.4 cm soft tissue density likely from scarring, without overt nodularity, but asymmetric to the contralateral side. When I compare this to 05/06/2014, the soft tissue density in this vicinity looks thicker, and was previously more bandlike and only about 2-3 mm in thickness. Lungs/Pleura: Indistinct hazy opacity at the left lung apex possibly related to chronic scarring and unchanged from 05/06/2014. 2 by 4 mm left lower lobe nodule on image 30 series 4, retrospectively unchanged from 05/06/2014. Musculoskeletal: Unremarkable CT ABDOMEN PELVIS FINDINGS Hepatobiliary: Unremarkable Pancreas: Unremarkable Spleen: Unremarkable  Adrenals/Urinary Tract: Unremarkable Stomach/Bowel: Prominent stool throughout the colon favors constipation. Vascular/Lymphatic: Small pelvic lymph nodes are not pathologically enlarged by size criteria. No significant atheromatous calcification. Reproductive: Unremarkable Other: No supplemental non-categorized findings. Musculoskeletal: Focal sclerosis in the right ischial tuberosity in the vicinity of the previously biopsied structure. Stable left paracentral sclerosis in the L3 vertebral body, without any activity on prior PET-CT, bone scan, or visible lesion in this vicinity on the prior MRI of January 2016, likely incidental. IMPRESSION: 1. Along the left lateral mastectomy site, there is some increased thickening of the subcutaneous scar-like appearance in this vicinity compared to 05/06/14. Has the patient had interval procedural intervention in this region since January? This could certainly be explained by some additional scarring, and there is no focal nodularity to further suggest malignancy, but the increase in thickness of the flat subcutaneous density in this region merits observation. 2. Stable sclerosis inferiorly in the right ischial tuberosity, previously biopsied with only benign results, and with reducing conspicuity on bone scan. This would seem to suggest that this sclerotic lesion does not harbor active malignancy and may simply be reactive to the adjacent ischial tuberosity. Compared to the CT from 05/06/2014, there is some increased sclerosis, but this may be a related to the interval biopsy. 3. Hazy opacity at the left lung apex likely from scarring, stable. Electronically Signed   By: WVan ClinesM.D.   On: 04/19/2015 16:09     ASSESSMENT: 55y.o.  BRCA negative [place] woman ,  (1) status post bilateral mastectomies January 2010 for a left-sided T2 N0 (stage IIA) invasive ductal carcinoma, grade 1, strongly estrogen and progesterone receptor positive, HER-2 nonamplified,    (2) Oncotype DX recurrence score of 17, predicting a 10-12% distant recurrence risk over 10 years if her only adjuvant treatment is tamoxifen for 5 years  (3) status post Left postmastectomy radiation including Left supraclavicular fossa (5040 cGy in 28 sessions)  (4) on tamoxifen starting February of 2010, stopped May 2015 because of endometrial hyperplasia  (5) osteopenia-- T score -1.2 on repeated bone density scans 09/24/2011 and 12/12/2013  RECURRENT DISEASE (6) skin biopsy of a lesion under the left breast scar 04/21/2014  shows adenocarcinoma, gross cystic disease fluid protein, estrogen and progesterone receptor positive  (a) s/p wide excision with negative though close margins 06/01/2014, prognostic panel pending  (b) radiation to Left chest wall 4000 cGy in 20 sessions, left schest wall boost 1000 cGy in 5 session 07/12/2014 through 08/16/2014  (7) PET scan and bone scan show only one additional area of concern, in the right ischiial tuberosity--  (a) biopsy 05/24/2014 showed no evidence of malignancy  (b) zolendronate started 06/06/2014, poorly tolerated  (c) denosumab/ Xgeva started 09/26/2014  (d) repeat bone scan 12/20/2014 shows no new areas of disease, and the previously biopsied area to be December  (8) goserelin monthly started 05/09/2016, last dose 02/20/2015  (a) s/p BSO 03/13/2015 with benign pathology  (9) anastrozole started  08/29/2014  (10) considering palbociclib   PLAN: Vicky's scans are very favorable, with no evidence of other areas of disease, and the questionable area in the is she'll tuberosity looking if anything improved. Likely the changes there are due to her being on Denosumab.  The changes in the left lateral chest wall are very likely going to be due to her prior surgery there. I certainly do not feel anything at today by exam. This will require follow-up though so we are going to obtain a repeat chest CT in March I just to make sure there  have been no changes there. We will also obtain plain films of her lumbar area.  I am comfortable with her stopping her calcium supplementation at this point. We will follow her vitamin D level at the next set of labs.  At this point we are becoming a little bit more confident that we are not dealing with stage IV disease. Accordingly I am moving her Xgeva to every 3 months and next year hopefully we will go to [place], every 6 months.  Vickie is having trouble with TMJ and needs some extra help with that. I will refer her to a different dentists and she has been having some difficulties with her current one.  Otherwise she will see me again late March 2017. She will have lab work and her restaging studies 2 days before. She knows to call for any problems that may develop before her next visit.  Chauncey Cruel, MD  04/25/2015

## 2015-04-25 NOTE — Telephone Encounter (Signed)
Received call from triage patient called re being given appointments that were different than what she and provider talked about. According to 1/3 pof patient to have lab 3/29 - see GM 3*31 @ 8 am followed by inj. Appointments rescheduled according to pof and patient agrees to proceed with appointments as requested by provider - lab 3/29 - GM 3/31 @ 8 am and inj 8:45 am. Patient has new dates/times and will also check my chart for update.

## 2015-04-28 ENCOUNTER — Encounter: Payer: Self-pay | Admitting: Internal Medicine

## 2015-05-10 ENCOUNTER — Telehealth: Payer: Self-pay | Admitting: Internal Medicine

## 2015-05-10 NOTE — Telephone Encounter (Signed)
Pt had vit d drawn at [facility] that was added on by cory. Pt would like results

## 2015-05-11 NOTE — Telephone Encounter (Signed)
Left a message for pt that I requested results from the [facility].

## 2015-05-11 NOTE — Telephone Encounter (Signed)
Called and left a message at the cancer center.  I do not see where the results were entered.

## 2015-05-12 ENCOUNTER — Ambulatory Visit: Payer: BC Managed Care – PPO

## 2015-05-12 ENCOUNTER — Other Ambulatory Visit: Payer: BC Managed Care – PPO

## 2015-05-25 NOTE — Telephone Encounter (Signed)
Labs were scanned to pt's chart.  Per Erin [name] pt should take 800 units of Vitamin D daily. Left a detailed message for pt.

## 2015-05-30 NOTE — Telephone Encounter (Signed)
Pt returned my call and states that she is taking 2000 units of Vitamin D daily.  Per Tommi Rumps pt should continue to take that dose.

## 2015-06-08 ENCOUNTER — Ambulatory Visit (INDEPENDENT_AMBULATORY_CARE_PROVIDER_SITE_OTHER): Payer: BC Managed Care – PPO | Admitting: Neurology

## 2015-06-08 ENCOUNTER — Encounter: Payer: Self-pay | Admitting: Neurology

## 2015-06-08 VITALS — BP 130/84 | HR 78 | Resp 16 | Ht 65.0 in | Wt 156.0 lb

## 2015-06-08 DIAGNOSIS — Z853 Personal history of malignant neoplasm of breast: Secondary | ICD-10-CM

## 2015-06-08 DIAGNOSIS — R6884 Jaw pain: Secondary | ICD-10-CM

## 2015-06-08 DIAGNOSIS — R930 Abnormal findings on diagnostic imaging of skull and head, not elsewhere classified: Secondary | ICD-10-CM

## 2015-06-08 DIAGNOSIS — R9082 White matter disease, unspecified: Secondary | ICD-10-CM

## 2015-06-08 NOTE — Progress Notes (Signed)
Subjective:    Patient ID: Erin [name] is a 55 y.o. female.  HPI     Interim history:   Erin [name] is a 55 year old right-handed woman with an underlying medical history of breast cancer, status post bilateral mastectomies, and status post XRT, hypertension, allergic rhinitis, osteopenia, hypothyroidism, status post tonsillectomy, right knee arthroscopic surgery, wisdom tooth extraction, and overweight state, who presents for follow up consultation of a history of transient right tongue deviation. The patient is unaccompanied today. Her first met her on 02/27/2015 at the request of her surgeon, at which time she reported a possible transient tongue deviation to the right side as noted by her dentist a few weeks prior. She denied any other focal neurological problems. She denied visual symptoms or swallowing symptoms but did have previous headache secondary to allergies and sinus issues. Her exam was benign. She had recently had a brain MRI in the recent past with contrast on 07/04/2014. We mutually agreed to observe.   Today, 06/08/2015: She reports intermittent left sided jaw pain. 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 She had a brain MRI in March 2016 and is wondering if she needs another brain MRI. She does notice that her jaw alignment seems to be different. She used to have a bite guard which fit well but needed replacement. She had last year to other bite guards made but they did not seem right. She then had a more flexible 1 made which is tolerable but does not feel as good as her original bite guard for years ago. She has started eating more softer food because of left jaw pain. When she applies pressure with her finger at her left TMJ seems to alleviate some of the pain. She has tried ibuprofen but has a tendency to get canker sores from it. She has intermittent headaches which are in the neck area and upwards. These are not unusual for her. We've reviewed her brain  MRI from March 2016 together. I pointed out the small degree of white matter changes to her. She had no further tongue related problems. She has no one-sided weakness or numbness, no droopy face, no focal neurological symptoms.  Previously:   02/27/2015: She reports a possible transient tongue deviation to one side, as noted by her dentist a few weeks ago. When she went back for the follow-up appointment about a week ago she had no further signs of tongue deviation. She has self had not noticed any problems with her tongue, her mouth, her speech, and for that matter, had no one-sided weakness or numbness. Sometimes she gets numbness in her right arm because she is at the computer a lot and has occasional tingling in her fingertips but this goes away with position changes. She does not have significant headaches but has had recurrent headaches in the context of sinus symptoms and allergy symptoms. She does not have any visual problems. She has a family history of stroke in her grandmother. She herself has very few vascular risk factors and has had some increased blood pressure values lately. She is scheduled for a elective oophorectomy later this month. With the recent scare of potentially having had a TIA she is very stressed about this. She feels at baseline and has felt at baseline throughout this, but does admit to stressing over this. She has no problems sleeping generally speaking. Sometimes she has trouble going to sleep. She does not take any sleeping pills. She denies any depression. Nevertheless, she  is somewhat tearful today because she is stressed out. She does not snore or have any apneic breathing pauses while asleep. Of note, she lives alone. She is a Automotive engineer at [employer]. She is a never smoker. She drinks alcohol rarely and does not drink caffeine on a daily basis. She tries to stay active mentally and physically. She tries to drink enough water.  She had a brain MRI with contrast on  07/04/2014 which showed no acute abnormalities and no evidence of metastatic disease. In addition, I personally reviewed the images through the PACS system and agree with the report. She had a nuclear medicine whole body bone scan on 12/20/2014: 1. Decreased conspicuity of increased uptake in the inferior aspect of the right ischium posteriorly. No new abnormal uptake within the pelvis is demonstrated. 2. Elsewhere there are no areas of abnormal uptake to suggest metastatic disease.   Her Past Medical History Is Significant For: Past Medical History  Diagnosis Date  . Hypertension   . Allergic rhinitis due to pollen   . Osteopenia   . Hypothyroidism   . Wears glasses   . S/P radiation therapy 07/12/2014 through 08/16/2014     Left chest wall 4000 cGy in 20 sessions, left chest wall boost 1000 cGy in 5 sessions  . Breast cancer ([place]) 2010  . Breast cancer ([place]) 2016    reoccurrence in scar    Her Past Surgical History Is Significant For: Past Surgical History  Procedure Laterality Date  . Tonsillectomy    . Right knee arthroscopy  2008  . Inguinal hernia repair      right side as infant  . Wisdom tooth extraction    . Cyst removed      left lower jaw - at age 30 yr  . Dilatation & currettage/hysteroscopy with resectocope N/A 09/16/2013    Procedure: DILATATION & CURETTAGE/HYSTEROSCOPY WITH RESECTOCOPE;  Surgeon: Princess Bruins, MD;  Location: [facility];  Service: Gynecology;  Laterality: N/A;  1 hr.  . Mastectomy  2010    bilateral mastectomies-lt snbx-radiation post  . Colonoscopy    . Breast lumpectomy Left 06/01/2014    Procedure: WIDE LOCAL EXCISION OF RECURRENT LEFT BREAST CANCER;  Surgeon: Rolm Bookbinder, MD;  Location: [facility];  Service: General;  Laterality: Left;  . Oophorectomy  2016    patient denies  . Mastectomy Bilateral   . Laparoscopic pelvic lymph node biopsy    . Robotic assisted  bilateral salpingo oopherectomy Bilateral 03/13/2015    Procedure: ROBOTIC ASSISTED BILATERAL SALPINGO OOPHORECTOMY With Washings;  Surgeon: Princess Bruins, MD;  Location: [facility];  Service: Gynecology;  Laterality: Bilateral;    Her Family History Is Significant For: Family History  Problem Relation Age of Onset  . Hypertension Mother   . Cancer Mother     Lung Cancer - long history of tobacco use  . Hypertension Father   . Colon cancer Neg Hx   . Stomach cancer Neg Hx   . Stroke Maternal Grandfather   . Stroke Paternal Grandfather     Her Social History Is Significant For: Social History   Social History  . Marital Status: Single    Spouse Name: N/A  . Number of Children: 0  . Years of Education: PhD   Social History Main Topics  . Smoking status: Never Smoker   . Smokeless tobacco: Never Used  . Alcohol Use: 0.0 oz/week    0 Standard drinks or equivalent per week  Comment: rare  . Drug Use: No  . Sexual Activity: Yes    Birth Control/ Protection: None   Other Topics Concern  . None   Social History Narrative   She grew up in [place] last 16 yrs in [place]   Rare caffeine use     Her Allergies Are:  Allergies  Allergen Reactions  . Pollen Extract   . Adhesive [Tape] Rash  :   Her Current Medications Are:  Outpatient Encounter Prescriptions as of 06/08/2015  Medication Sig  . acetaminophen (TYLENOL) 500 MG tablet Take 500 mg by mouth every 6 (six) hours as needed for moderate pain or headache.  Marland Kitchen acyclovir ointment (ZOVIRAX) 5 % Apply 1 application topically every 3 (three) hours. As needed  . anastrozole (ARIMIDEX) 1 MG tablet Take 1 tablet (1 mg total) by mouth daily.  . Cholecalciferol (VITAMIN D3) 2000 UNITS capsule Take 2,000 Units by mouth daily.    Marland Kitchen denosumab (XGEVA) 120 MG/1.7ML SOLN injection Inject into the skin.  Marland Kitchen ibuprofen (ADVIL,MOTRIN) 200 MG tablet Take 200 mg by mouth every 6 (six) hours as needed for moderate pain.  Marland Kitchen  levothyroxine (SYNTHROID, LEVOTHROID) 75 MCG tablet Take 1 tablet (75 mcg total) by mouth daily.  Marland Kitchen loratadine (CLARITIN) 10 MG tablet Take 10 mg by mouth daily as needed for allergies.   Marland Kitchen losartan (COZAAR) 100 MG tablet Take 1 tablet (100 mg total) by mouth daily.  Vladimir Faster Glycol-Propyl Glycol (SYSTANE OP) Apply 1 drop to eye as needed (for allergies).  . sodium chloride (OCEAN) 0.65 % SOLN nasal spray Place 1 spray into both nostrils as needed for congestion.  . [DISCONTINUED] Calcium Carbonate-Vitamin D (OS-CAL 500 + D PO) Take 1 tablet by mouth daily.  . [DISCONTINUED] Denosumab (XGEVA [place]) Inject 120 mg into the skin every 30 (thirty) days. Last injection was 02/20/15.   No facility-administered encounter medications on file as of 06/08/2015.  :  Review of Systems:  Out of a complete 14 point review of systems, all are reviewed and negative with the exception of these symptoms as listed below:   Review of Systems  Neurological:       Patient would like to discuss possible MRI.  Having L sided jaw pain. H/O TMJ but states that this feels different.     Objective:  Neurologic Exam  Physical Exam Physical Examination:   Filed Vitals:   06/08/15 1503  BP: 130/84  Pulse: 78  Resp: 16    General Examination: The patient is a very pleasant 55 y.o. female in no acute distress. She appears well-developed and well-nourished and well groomed. She is quite anxious appearing.   HEENT: Normocephalic, atraumatic, pupils are equal, round and reactive to light and accommodation. Funduscopic exam is normal with sharp disc margins noted. Mild degree of bilateral cataracts. Extraocular tracking is good without limitation to gaze excursion or nystagmus noted. Normal smooth pursuit is noted. Hearing is grossly intact. Face is symmetric with normal facial animation and normal facial sensation. I did not appreciate any crepitation in her TMJs. She does seem to have a slightly asymmetrical lower  jaw. Speech is clear with no dysarthria noted. There is no hypophonia. There is no lip, neck/head, jaw or voice tremor. Neck is supple with full range of passive and active motion. There are no carotid bruits on auscultation. Oropharynx exam reveals: mild mouth dryness, good dental hygiene and no significant airway crowding, tonsils are absent. Mallampati is class I. Tongue protrudes  centrally and palate elevates symmetrically.    Chest: Clear to auscultation without wheezing, rhonchi or crackles noted.  Heart: S1+S2+0, regular and normal without murmurs, rubs or gallops noted.   Abdomen: Soft, non-tender and non-distended with normal bowel sounds appreciated on auscultation.  Extremities: There is no pitting edema in the distal lower extremities bilaterally. Pedal pulses are intact.  Skin: Warm and dry without trophic changes noted. There are no varicose veins.  Musculoskeletal: exam reveals no obvious joint deformities, tenderness or joint swelling or erythema.   Neurologically:  Mental status: The patient is awake, alert and oriented in all 4 spheres. Her immediate and remote memory, attention, language skills and fund of knowledge are appropriate. There is no evidence of aphasia, agnosia, apraxia or anomia. Speech is clear with normal prosody and enunciation. Thought process is linear. Mood is normal and affect is normal.  Cranial nerves II - XII are as described above under HEENT exam. In addition: shoulder shrug is normal with equal shoulder height noted. Motor exam: Normal bulk, strength and tone is noted. There is no drift, tremor or rebound. Romberg is negative. Reflexes are 2+ throughout. Fine motor skills and coordination: intact in the UEs and LEs.   Cerebellar testing: No dysmetria or intention tremor on finger to nose testing. Heel to shin is unremarkable bilaterally. There is no truncal or gait ataxia.  Sensory exam: intact to light touch, pinprick, vibration, temperature sense in  the upper and lower extremities.  Gait, station and balance: She stands easily. No veering to one side is noted. No leaning to one side is noted. Posture is age-appropriate and stance is narrow based. Gait shows normal stride length and normal pace. No problems turning are noted. She turns en bloc. Tandem walk is unremarkable.   Assessment and Plan:   In summary, Erin [name] is a very pleasant 55 year old female with an underlying medical history of breast cancer, status post bilateral mastectomies, and status post XRT, hypertension, allergic rhinitis, osteopenia, hypothyroidism, status post tonsillectomy, right knee arthroscopic surgery, wisdom tooth extraction, and overweight state, who presents for follow-up consultation of a transient tongue deviation noted several months ago. Today, she reports intermittent left jaw pain. She she has a long-standing history of TMJ problems. She used to have a bite guard but a more updated bite guard last year did not sit as well. I suggested further workup of TMJ with evaluation through a dentist. I made a referral in that regard to Dr. Augustina Mood. In addition, she is advised about her brain MRI results from March 2016 and I shared some images on the computer with her. She has a nonfocal neurological exam. She does not have any cranial nerve findings.she had no further issues with her tongue. She denies recurrent headaches, but reports occasional neck pain and posterior headaches and sinus issues as well. She has very few vascular risk factors.  We can certainly proceed with a brain MRI with and without contrast at this time given her complex history and prior finding of white matter changes we can certainly ask for comparison of both scans. We will call her with her test results. We talked about maintaining a healthy lifestyle in general. She was advised to see a cardiologist in the past by her dentist. She can certainly pursue this. I doubt that her left-sided  jaw pain has anything to do with cardiac origin. Nevertheless, if she is advised to pursue cardiac workup by her other doctors she is encouraged to pursue this  as well. I spent 40 minutes in total face-to-face time with the patient, more than 50% of which was spent in counseling and coordination of care, reviewing test results, reviewing medication and discussing or reviewing the diagnosis of L jaw pain, WM changes on brain MRI, the prognosis and treatment options.

## 2015-06-08 NOTE — Patient Instructions (Addendum)
Exam looks good today, you may have left sided TMJ.  I will refer you to Dr. Augustina Mood. If you don't hear back from her office next week, call us back.  We will do a brain scan, called MRI and call you with the test results. We will have to schedule you for this on a separate date. This test requires authorization from your insurance, and we will take care of the insurance process.

## 2015-06-09 ENCOUNTER — Ambulatory Visit: Payer: BC Managed Care – PPO

## 2015-06-09 ENCOUNTER — Other Ambulatory Visit: Payer: BC Managed Care – PPO

## 2015-06-22 ENCOUNTER — Ambulatory Visit (HOSPITAL_COMMUNITY): Payer: BC Managed Care – PPO

## 2015-06-28 ENCOUNTER — Ambulatory Visit (HOSPITAL_COMMUNITY): Admission: RE | Admit: 2015-06-28 | Payer: BC Managed Care – PPO | Source: Ambulatory Visit

## 2015-06-30 ENCOUNTER — Ambulatory Visit (HOSPITAL_COMMUNITY)
Admission: RE | Admit: 2015-06-30 | Discharge: 2015-06-30 | Disposition: A | Discharge status: Home / Self Care | Payer: BC Managed Care – PPO | Source: Ambulatory Visit | Attending: Neurology | Admitting: Neurology

## 2015-06-30 DIAGNOSIS — Z853 Personal history of malignant neoplasm of breast: Secondary | ICD-10-CM | POA: Diagnosis present

## 2015-06-30 DIAGNOSIS — R6884 Jaw pain: Secondary | ICD-10-CM | POA: Insufficient documentation

## 2015-06-30 DIAGNOSIS — M2669 Other specified disorders of temporomandibular joint: Secondary | ICD-10-CM | POA: Insufficient documentation

## 2015-06-30 DIAGNOSIS — R930 Abnormal findings on diagnostic imaging of skull and head, not elsewhere classified: Secondary | ICD-10-CM | POA: Insufficient documentation

## 2015-06-30 LAB — POCT I-STAT CREATININE: CREATININE: 0.9 mg/dL (ref 0.44–1.00)

## 2015-06-30 MED ORDER — GADOBENATE DIMEGLUMINE 529 MG/ML IV SOLN
15.0000 mL | Freq: Once | INTRAVENOUS | Status: AC | PRN
  Administered 2015-06-30: 14 mL via INTRAVENOUS

## 2015-07-03 ENCOUNTER — Telehealth: Payer: Self-pay | Admitting: Oncology

## 2015-07-03 ENCOUNTER — Telehealth: Payer: Self-pay

## 2015-07-03 NOTE — Progress Notes (Signed)
Quick Note:  Please call patient regarding her recent brain MRI. Thankfully, no acute changes and brain itself looks stable. However, there are progressive left sided more than right sided TMJ problems, ie. Inflammation and signs of TMJ arthritis. As discussed, she will have to see her dentist or we can make a referral as discussed to someone else, we talked about having her seeing Dr. Augustina Mood. Please ask about her dental appointment and if she needs a referral from [place] we will be happy to do that. Star Age, MD, PhD [facility] ([facility])  ______

## 2015-07-03 NOTE — Telephone Encounter (Signed)
-----   Message from Star Age, MD sent at 07/03/2015 10:13 AM EDT ----- Please call patient regarding her recent brain MRI. Thankfully, no acute changes and brain itself looks stable. However, there are progressive left sided more than right sided TMJ problems, ie. Inflammation and signs of TMJ arthritis. As discussed, she will have to see her dentist or we can make a referral as discussed to someone else, we talked about having her seeing Dr. Augustina Mood. Please ask about her dental appointment and if she needs a referral from [place] we will be happy to do that. Star Age, MD, PhD [facility])

## 2015-07-03 NOTE — Telephone Encounter (Signed)
It looks like the radiologist felt it was inflammation ie arthritis, no evidence of necrosis, but a dentist may be able to tell better.  I can see her back as needed, yes, at this point.

## 2015-07-03 NOTE — Telephone Encounter (Signed)
I spoke to patient and she is aware of results below. She had 2 questions... 1. From the MRI do you think this is just TMJ or side effect from xgeva (rare side effect of jaw necrosis)  2. Do you need to see her back or on as needed basis?

## 2015-07-03 NOTE — Telephone Encounter (Signed)
Rescheduled 3/31 f/u due to PAL. Per desk nurse patient can be moved to either 3/28 at 8 am or 4/7 at 8 am. Patient not able to come in on 3/28 or 4/7, neither can patient come 4/10 or 4/12 (open slots). Per patient she can come 4/7 @ 4pm - Patient scheduled at this time due to no other availability to accommodate patient without going too far out. Injection associated with 3/31 f/u also moved - per patient she would like to see GM before having injection again. Injection on 4/7 to be given by desk nurse or infusion nurse being that patient seeing GM at 4 pm and does not want injection prior to speaking with him. Patient has new date/time for f/u and injection 4/7 @ 4 pm - also confirmed 3/29 lab.

## 2015-07-04 NOTE — Telephone Encounter (Signed)
Noted MRI faxed to Dr. Toy Cookey [phone].

## 2015-07-04 NOTE — Telephone Encounter (Signed)
I spoke to [name] and she is aware of results. She wants to make sure that Erin [name] has a copy of her MRI, can you help with this Hinton Dyer? She has an appt with Dr. Toy Cookey tomorrow.

## 2015-07-04 NOTE — Telephone Encounter (Signed)
LM to call back.

## 2015-07-04 NOTE — Telephone Encounter (Signed)
Pt returned Diana's call. Please call on cell [phone]

## 2015-07-07 ENCOUNTER — Ambulatory Visit (HOSPITAL_COMMUNITY)
Admission: RE | Admit: 2015-07-07 | Discharge: 2015-07-07 | Disposition: A | Discharge status: Home / Self Care | Payer: BC Managed Care – PPO | Source: Ambulatory Visit | Attending: Oncology | Admitting: Oncology

## 2015-07-07 DIAGNOSIS — Z9013 Acquired absence of bilateral breasts and nipples: Secondary | ICD-10-CM | POA: Insufficient documentation

## 2015-07-07 DIAGNOSIS — C50912 Malignant neoplasm of unspecified site of left female breast: Secondary | ICD-10-CM | POA: Diagnosis present

## 2015-07-07 DIAGNOSIS — C50412 Malignant neoplasm of upper-outer quadrant of left female breast: Secondary | ICD-10-CM | POA: Insufficient documentation

## 2015-07-07 MED ORDER — IOHEXOL 300 MG/ML  SOLN
75.0000 mL | Freq: Once | INTRAMUSCULAR | Status: AC | PRN
  Administered 2015-07-07: 75 mL via INTRAVENOUS

## 2015-07-19 ENCOUNTER — Other Ambulatory Visit: Payer: Self-pay | Admitting: *Deleted

## 2015-07-19 ENCOUNTER — Ambulatory Visit (HOSPITAL_BASED_OUTPATIENT_CLINIC_OR_DEPARTMENT_OTHER): Payer: BC Managed Care – PPO

## 2015-07-19 DIAGNOSIS — C50412 Malignant neoplasm of upper-outer quadrant of left female breast: Secondary | ICD-10-CM

## 2015-07-19 DIAGNOSIS — C50912 Malignant neoplasm of unspecified site of left female breast: Secondary | ICD-10-CM

## 2015-07-19 LAB — COMPREHENSIVE METABOLIC PANEL
ALT: 14 U/L (ref 0–55)
ANION GAP: 8 meq/L (ref 3–11)
AST: 17 U/L (ref 5–34)
Albumin: 4 g/dL (ref 3.5–5.0)
Alkaline Phosphatase: 63 U/L (ref 40–150)
BUN: 17.7 mg/dL (ref 7.0–26.0)
CHLORIDE: 102 meq/L (ref 98–109)
CO2: 26 meq/L (ref 22–29)
CREATININE: 0.9 mg/dL (ref 0.6–1.1)
Calcium: 9.5 mg/dL (ref 8.4–10.4)
EGFR: 77 mL/min/{1.73_m2} — ABNORMAL LOW (ref >90)
GLUCOSE: 104 mg/dL (ref 70–140)
Potassium: 4.2 mEq/L (ref 3.5–5.1)
SODIUM: 135 meq/L — AB (ref 136–145)
Total Bilirubin: 1.32 mg/dL — ABNORMAL HIGH (ref 0.20–1.20)
Total Protein: 7.2 g/dL (ref 6.4–8.3)

## 2015-07-19 LAB — CBC WITH DIFFERENTIAL/PLATELET
BASO%: 0.9 % (ref 0.0–2.0)
BASOS ABS: 0.1 10*3/uL (ref 0.0–0.1)
EOS%: 2.3 % (ref 0.0–7.0)
Eosinophils Absolute: 0.2 10*3/uL (ref 0.0–0.5)
HEMATOCRIT: 45.7 % (ref 34.8–46.6)
HGB: 15.4 g/dL (ref 11.6–15.9)
LYMPH#: 1.5 10*3/uL (ref 0.9–3.3)
LYMPH%: 22.1 % (ref 14.0–49.7)
MCH: 29.2 pg (ref 25.1–34.0)
MCHC: 33.6 g/dL (ref 31.5–36.0)
MCV: 86.8 fL (ref 79.5–101.0)
MONO#: 0.5 10*3/uL (ref 0.1–0.9)
MONO%: 7.8 % (ref 0.0–14.0)
NEUT#: 4.6 10*3/uL (ref 1.5–6.5)
NEUT%: 66.9 % (ref 38.4–76.8)
Platelets: 217 10*3/uL (ref 145–400)
RBC: 5.27 10*6/uL (ref 3.70–5.45)
RDW: 13.1 % (ref 11.2–14.5)
WBC: 6.9 10*3/uL (ref 3.9–10.3)

## 2015-07-20 ENCOUNTER — Ambulatory Visit: Payer: BC Managed Care – PPO | Admitting: Oncology

## 2015-07-20 ENCOUNTER — Ambulatory Visit: Payer: BC Managed Care – PPO

## 2015-07-20 ENCOUNTER — Other Ambulatory Visit: Payer: BC Managed Care – PPO

## 2015-07-20 LAB — VITAMIN D 25 HYDROXY (VIT D DEFICIENCY, FRACTURES): Vitamin D, 25-Hydroxy: 35.6 ng/mL (ref 30.0–100.0)

## 2015-07-20 LAB — CANCER ANTIGEN 27.29: CAN 27.29: 15.8 U/mL (ref 0.0–38.6)

## 2015-07-20 LAB — CANCER ANTIGEN 27-29 (PARALLEL TESTING): CA 27.29: 17 U/mL (ref <38)

## 2015-07-21 ENCOUNTER — Ambulatory Visit: Payer: BC Managed Care – PPO | Admitting: Oncology

## 2015-07-21 ENCOUNTER — Ambulatory Visit: Payer: BC Managed Care – PPO

## 2015-07-26 ENCOUNTER — Telehealth: Payer: Self-pay | Admitting: *Deleted

## 2015-07-26 NOTE — Telephone Encounter (Signed)
"  I'm scheduled to see Dr. [name] Friday.  He needs to review the brain imaging I had on June 30, 2015.  It shows jaw deterioration and this is a side effect I want to discuss with him.  I am to receive the shot after I see him.  I see him 07-28-2015 at 4:00 pm."

## 2015-07-27 ENCOUNTER — Other Ambulatory Visit: Payer: Self-pay

## 2015-07-27 NOTE — Telephone Encounter (Signed)
Pt's called given to MD with reading of MRI showing area of concern.

## 2015-07-28 ENCOUNTER — Ambulatory Visit: Payer: BC Managed Care – PPO

## 2015-07-28 ENCOUNTER — Ambulatory Visit (HOSPITAL_BASED_OUTPATIENT_CLINIC_OR_DEPARTMENT_OTHER): Payer: BC Managed Care – PPO | Admitting: Oncology

## 2015-07-28 VITALS — BP 146/75 | HR 82 | Temp 98.1°F | Resp 18 | Ht 65.0 in | Wt 161.7 lb

## 2015-07-28 DIAGNOSIS — Z170 Estrogen receptor positive status [ER+]: Secondary | ICD-10-CM | POA: Diagnosis not present

## 2015-07-28 DIAGNOSIS — M26609 Unspecified temporomandibular joint disorder, unspecified side: Secondary | ICD-10-CM

## 2015-07-28 DIAGNOSIS — C50412 Malignant neoplasm of upper-outer quadrant of left female breast: Secondary | ICD-10-CM

## 2015-07-28 DIAGNOSIS — M8580 Other specified disorders of bone density and structure, unspecified site: Secondary | ICD-10-CM

## 2015-07-28 DIAGNOSIS — C50512 Malignant neoplasm of lower-outer quadrant of left female breast: Secondary | ICD-10-CM

## 2015-07-28 MED ORDER — ANASTROZOLE 1 MG PO TABS: 1.0000 mg | ORAL_TABLET | Freq: Every day | ORAL | Status: DC

## 2015-07-28 NOTE — Progress Notes (Signed)
Erin [name]  MR#: [id number]    PCP: Drema Pry, DO GYN: Thomes Cake SU: Rolm Bookbinder OTHER MD: Crista Luria, [address] JN Mann, Arloa Koh, Bonner Puna, Saima Irine Seal DDS CHIEF COMPLAINT: Recurrent breast cancer  CURRENT THERAPY: anastrozole; denosumab    BREAST CANCER HISTORY From the original intake note 04/07/2011:  Ms. Erin [name] had routine screening mammography December of 2009 at the [facility] in [place], [place], showing an increasing asymmetry in her left breast. She was recalled for additional views December 29. She was noted to have heterogeneously dense breasts. It was a 5 cm area of architectural distortion in the lateral aspect of the left breast with no associated calcifications. There was a second lesion medial to this. Ultrasound showed a highly suspicious hypoechoic irregularly marginated mass measuring 3 cm at the 2:30 position 5 cm from the nipple. This was palpable to the mammographer. The second area in question I measured 7 mm and a third lesion was noted measuring 5 mm. Some left axillary lymph nodes were morphologically normal.  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 Bilateral breast MRIs were performed 05/04/2008 and showed, in the left breast, a large lobulated mass measuring up to 4.3 cm, and including both of the apparently separate masses the previously biopsied. In the right breast there were 2 indeterminate lesions. These were evaluated further with breast specific gamma imaging performed January 14 in both lesions were negative. The lesion in the left breast was markedly abnormal.  Given this complex history and with the background of significant breast density, the patient opted for bilateral mastectomies with left sentinel lymph node sampling. This was performed of  06/27/2008 and showed([id number]. [id number]) in the right breast, no malignancy. In the left breast there was a 3.2 cm invasive ductal carcinoma, grade 1, focally extending to the anterior margin of the lower outer quadrant. There was extensive angiolymphatic invasion, but both sentinel lymph nodes on the left were negative.  The patient received left-sided postmastectomy radiation including of the left chest wall and left supraclavicular fossa to a total dose of 50.4 Gy plus a 10 Gy scar boost. She had an Oncotype DX recurrence score of 17, further discussed below. She decided to forego reconstruction. She was tested for BRCA1 and 2 and was found to be negative. Given her overall prognosis, she did not receive chemotherapy, but started tamoxifen February of 2010, with good tolerance.  RECURRENT DISEASE: SUMMARY OF INITIAL PRESENTATION  Erin [name] took tamoxifen for 5 years with no evidence of active disease area did in May 2015 we decided to stop the tamoxifen because of problems with endometrial hyperplasia. 6 months later, at the November visit, she was found to have a change in a small area of scaring her left inframammary fold. She was referred to dermatology and biopsy 04/21/2014 showed ([id number]) recurrent ductal adenocarcinoma (positive for gross cystic disease fluid protein, estrogen, and progesterone). She then underwent a staging PET scan in [facility] 05/06/2014. This showed a 1.9 cm sclerotic lesion in the right ischial tuberosity, with a maximum standard uptake value of 4.6. A sclerotic lesion in the left side of the L3 vertebral body measuring 1.6 cm had no hypermetabolic activity.  Her subsequent history is as detailed below.  INTERVAL HISTORY: Erin [name] returns today for follow-up of her recurrentbreast cancer. She continues on anastrozole, with good tolerance.  She does have occasional hot flashes as the main side effect. She obtains a drug at a good price.  We have interrupted her  denosumab/Xgeva because she developed some TMJ problems and she wanted to work on that. That got considerably worse in January and she was not getting anywhere with her original dentist so she went to see Dr. Toni Arthurs. She is now adjusting Sharian's mouthguard and things seem to be improving.  REVIEW OF SYSTEMS: Erin [name] continues to do a lot of traveling. This makes it difficult to stick to a diet and exercise program, but she enjoys walking and doing the elliptical and she does have a gym that she belongs to. She has lost her primary care physician and would like me to refer her to a new one. She is having some flickering of her right upper eyelid him a little bit of a twinge, and she wonders if that could be related to allergies. She wonders why her vitamin D level is coming down. Likely this is because he was just winter. It is still in the normal range however.. Otherwise a detailed review of systems today was noncontributory  Past Medical History  Diagnosis Date  . Hypertension   . Allergic rhinitis due to pollen   . Osteopenia   . Hypothyroidism   . Wears glasses   . S/P radiation therapy 07/12/2014 through 08/16/2014     Left chest wall 4000 cGy in 20 sessions, left chest wall boost 1000 cGy in 5 sessions  . Breast cancer (HCC) 10-01-08  . Breast cancer (HCC) 10-02-2014    reoccurrence in scar  Diverticulosis       Past Surgical History  Procedure Laterality Date  . Tonsillectomy    . Right knee arthroscopy  10-02-2006  . Inguinal hernia repair      right side as infant  . Wisdom tooth extraction    . Cyst removed      left lower jaw - at age 67 yr  . Dilatation & currettage/hysteroscopy with resectocope N/A 09/16/2013    Procedure: DILATATION & CURETTAGE/HYSTEROSCOPY WITH RESECTOCOPE;  Surgeon: Genia Del, MD;  Location: [facility];  Service: Gynecology;  Laterality: N/A;  1 hr.  . Mastectomy  2010    bilateral mastectomies-lt  snbx-radiation post  . Colonoscopy    . Breast lumpectomy Left 06/01/2014    Procedure: WIDE LOCAL EXCISION OF RECURRENT LEFT BREAST CANCER;  Surgeon: Emelia Loron, MD;  Location: [facility];  Service: General;  Laterality: Left;  . Oophorectomy  Oct 02, 2014    patient denies  . Mastectomy Bilateral   . Laparoscopic pelvic lymph node biopsy    . Robotic assisted bilateral salpingo oopherectomy Bilateral 03/13/2015    Procedure: ROBOTIC ASSISTED BILATERAL SALPINGO OOPHORECTOMY With Washings;  Surgeon: Genia Del, MD;  Location: [facility];  Service: Gynecology;  Laterality: Bilateral;    FAMILY HISTORY The patient's mother died in 2011-10-02 from complications of lung cancer. The patient has not been in touch with her father her for approximately 30 years. She had no sisters, 1 brother, who is in good health. There is no breast or ovarian cancer in the family to her knowledge.  GYNECOLOGIC HISTORY:  GX P0, menarche at around age 36, the patient continued to have periods despite being on tamoxifen, although more irregularly. 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 She lives alone and has no pets. Her work involves quite a bit of travel.     ADVANCED  DIRECTIVES: not in place  HEALTH MAINTENANCE:      Social History  Substance Use Topics  . Smoking status: Never Smoker   . Smokeless tobacco: Never Used  . Alcohol Use: 0.0 oz/week    0 Standard drinks or equivalent per week     Comment: rare      Colonoscopy: December 2013 Collene Mares)  PAP: Feb 2011  Bone density: 06/2009, normal  Cholesterol: "good"      Allergies  Allergen Reactions  . Pollen Extract   . Adhesive [Tape] Rash    MEDICATIONS:    Current Outpatient Prescriptions  Medication Sig Dispense Refill  . acetaminophen (TYLENOL) 500 MG tablet Take 500 mg by mouth every 6 (six) hours as needed for moderate pain or headache.    Marland Kitchen acyclovir ointment (ZOVIRAX) 5 % Apply 1  application topically every 3 (three) hours. As needed 5 g 5  . anastrozole (ARIMIDEX) 1 MG tablet Take 1 tablet (1 mg total) by mouth daily. 90 tablet 4  . Cholecalciferol (VITAMIN D3) 2000 UNITS capsule Take 2,000 Units by mouth daily.      Marland Kitchen denosumab (XGEVA) 120 MG/1.7ML SOLN injection Inject into the skin.    Marland Kitchen ibuprofen (ADVIL,MOTRIN) 200 MG tablet Take 200 mg by mouth every 6 (six) hours as needed for moderate pain.    Marland Kitchen levothyroxine (SYNTHROID, LEVOTHROID) 75 MCG tablet Take 1 tablet (75 mcg total) by mouth daily. 90 tablet 3  . loratadine (CLARITIN) 10 MG tablet Take 10 mg by mouth daily as needed for allergies.     Marland Kitchen losartan (COZAAR) 100 MG tablet Take 1 tablet (100 mg total) by mouth daily. 90 tablet 4  . Polyethyl Glycol-Propyl Glycol (SYSTANE OP) Apply 1 drop to eye as needed (for allergies).    . sodium chloride (OCEAN) 0.65 % SOLN nasal spray Place 1 spray into both nostrils as needed for congestion.     No current facility-administered medications for this visit.    OBJECTIVE:  Middle-aged white woman in no acute distress    Filed Vitals:   07/28/15 1603  BP: 146/75  Pulse: 82  Temp: 98.1 F (36.7 C)  Resp: 18     Body mass index is 26.91 kg/(m^2).     ECOG performance status: 1  Sclerae unicteric, pupils round and equal Oropharynx clear and moist-- no thrush or other lesions No cervical or supraclavicular adenopathy Lungs no rales or rhonchi Heart regular rate and rhythm Abd soft, nontender, positive bowel sounds MSK no focal spinal tenderness, no upper extremity lymphedema Neuro: nonfocal, well oriented, appropriate affect Breasts: Status post bilateral mastectomies, no evidence of left chest wall abnormality by inspection or palpation. No evidence of chest wall recurrence on either side. Both axillae are benign.  LABS:           Chemistry      Component Value Date/Time   NA 135* 07/19/2015 1603   NA 136 08/10/2014 1008   K 4.2 07/19/2015 1603   K 4.3  08/10/2014 1008   CL 101 08/10/2014 1008   CL 105 08/10/2012 0944   CO2 26 07/19/2015 1603   CO2 30 08/10/2014 1008   BUN 17.7 07/19/2015 1603   BUN 13 08/10/2014 1008   CREATININE 0.9 07/19/2015 1603   CREATININE 0.90 06/30/2015 2011      Component Value Date/Time   CALCIUM 9.5 07/19/2015 1603   CALCIUM 9.3 08/10/2014 1008   ALKPHOS 63 07/19/2015 1603   ALKPHOS 59 02/08/2014 0834   AST  17 07/19/2015 1603   AST 17 02/08/2014 0834   ALT 14 07/19/2015 1603   ALT 14 02/08/2014 0834   BILITOT 1.32* 07/19/2015 1603   BILITOT 0.9 02/08/2014 0834         Lab Results  Component Value Date   WBC 6.9 07/19/2015   HGB 15.4 07/19/2015   HCT 45.7 07/19/2015   MCV 86.8 07/19/2015   PLT 217 07/19/2015   NEUTROABS 4.6 07/19/2015    STUDIES  Dg Lumbar Spine Complete  07/07/2015  CLINICAL DATA:  History of breast cancer. Abnormal bone scan. Subsequent encounter. EXAM: LUMBAR SPINE - COMPLETE 4+ VIEW COMPARISON:  Whole-body bone scan 12/20/2014 and 05/20/2014. CT abdomen and pelvis 04/19/2015. Pelvic MRI 05/20/2014. FINDINGS: Vertebral body height and alignment are maintained. Sclerotic area in the L3 vertebral body seen on prior CT scan is not visible on this study. No lytic or sclerotic bony lesion is identified. Intervertebral disc space height is maintained. IMPRESSION: Negative examination. Sclerotic area in L3 seen on prior CT scan is not visible on this study. Electronically Signed   By: Inge Rise M.D.   On: 07/07/2015 14:38   Dg Sacrum/coccyx  07/07/2015  CLINICAL DATA:  History of recurrent left breast carcinoma EXAM: SACRUM AND COCCYX - 2+ VIEW COMPARISON:  04/19/2015 FINDINGS: Sacrum is intact. The sacral ala are unremarkable. No acute fracture is seen. There remains sclerosis in the lateral aspect of the ischium on the right stable from the recent CT examination. No acute abnormality is seen. IMPRESSION: No acute abnormality noted. Electronically Signed   By: Inez Catalina M.D.    On: 07/07/2015 14:36   Ct Chest W Contrast  07/07/2015  CLINICAL DATA:  Breast cancer diagnosed in 2010. Double mastectomy. Recurrence at the scar site 2015. Evaluate left lateral chest wall. EXAM: CT CHEST WITH CONTRAST TECHNIQUE: Multidetector CT imaging of the chest was performed during intravenous contrast administration. CONTRAST:  68m OMNIPAQUE IOHEXOL 300 MG/ML  SOLN COMPARISON:  04/19/2015 FINDINGS: Mediastinum/Nodes: No supraclavicular adenopathy. Normal heart size, without pericardial effusion. No central pulmonary embolism, on this non-dedicated study. No mediastinal or hilar adenopathy. Minimal residual thymic tissue in the anterior mediastinum. No internal mammary adenopathy. Lungs/Pleura: No pleural fluid. Left apical Erin [name]-glass opacity is unchanged and likely represents scarring. 1 mm posterior left upper lobe pulmonary nodule is felt to be similar, including image 25/ series 5. Upper abdomen: Focal steatosis adjacent the falciform ligament is unchanged. Normal imaged portions of the spleen, stomach, pancreas, gallbladder, adrenal glands, kidneys. Musculoskeletal: No axillary or subpectoral adenopathy. Status post bilateral mastectomy. Soft tissue thickening about the lateral left chest wall/mastectomy bed again identified. This measures on the order of 3.6 x 1.0 cm on image 29/series 2. Compare 3.8 x 0.8 cm on the prior. This suggests stability. No underlying osseous destruction. No acute osseous abnormality. IMPRESSION: Status post bilateral mastectomies. Similar soft tissue thickening about the left lateral chest wall/mastectomy bed. No findings to suggest progressive or metastatic disease within the chest. Electronically Signed   By: KAbigail MiyamotoM.D.   On: 07/07/2015 09:12   Mr BJeri CosWWVContrast  07/01/2015  CLINICAL DATA:  White matter abnormality on brain MRI. Left jaw pain. History of breast cancer. EXAM: MRI HEAD WITHOUT AND WITH CONTRAST TECHNIQUE: Multiplanar, multiecho pulse  sequences of the brain and surrounding structures were obtained without and with intravenous contrast. CONTRAST:  168mMULTIHANCE GADOBENATE DIMEGLUMINE 529 MG/ML IV SOLN COMPARISON:  07/04/2014 FINDINGS: There is no evidence of acute infarct, intracranial  hemorrhage, mass, midline shift, or extra-axial fluid collection. Ventricles and sulci are normal. Small foci of T2 hyperintensity scattered throughout the predominantly subcortical cerebral white matter bilaterally are unchanged, nonspecific, and minimally prominent for age. No abnormal enhancement is identified. Orbits are unremarkable. Paranasal sinuses and mastoid air cells are clear. Major intracranial vascular flow voids are preserved. No suspicious skull lesion is seen. 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 Left-sided changes have significantly progressed from the prior MRI. IMPRESSION: 1. No acute intracranial abnormality or mass. 2. Unchanged, mild cerebral white matter signal changes which are nonspecific. 3. Progressive, left greater than right TMJ arthritis. Electronically Signed   By: Logan Bores M.D.   On: 07/01/2015 09:53     ASSESSMENT: 55 y.o.  BRCA negative [place] woman ,  (1) status post bilateral mastectomies January 2010 for a left-sided T2 N0 (stage IIA) invasive ductal carcinoma, grade 1, strongly estrogen and progesterone receptor positive, HER-2 nonamplified,   (2) Oncotype DX recurrence score of 17, predicting a 10-12% distant recurrence risk over 10 years if her only adjuvant treatment is tamoxifen for 5 years  (3) status post Left postmastectomy radiation including Left supraclavicular fossa (5040 cGy in 28 sessions)  (4) on tamoxifen starting February of 2010, stopped May 2015 because of endometrial hyperplasia  (5) osteopenia-- T score -1.2 on repeated bone density scans 09/24/2011 and 12/12/2013  RECURRENT DISEASE (6) skin  biopsy of a lesion under the left breast scar 04/21/2014 shows adenocarcinoma, gross cystic disease fluid protein, estrogen and progesterone receptor positive  (a) s/p wide excision with negative though close margins 06/01/2014, prognostic panel pending  (b) radiation to Left chest wall 4000 cGy in 20 sessions, left schest wall boost 1000 cGy in 5 session 07/12/2014 through 08/16/2014  (7) PET scan and bone scan show only one additional area of concern, in the right ischiial tuberosity--  (a) biopsy 05/24/2014 showed no evidence of malignancy  (b) zolendronate started 06/06/2014, poorly tolerated  (c) denosumab/ Xgeva started 09/26/2014  (d) repeat bone scan 12/20/2014 shows no new areas of disease, and the previously biopsied area to be December  (8) goserelin monthly started 05/09/2016, last dose 02/20/2015  (a) s/p BSO 03/13/2015 with benign pathology  (9) anastrozole started  08/29/2014  (10) considering palbociclib   PLAN: I reviewed all the recent studies with PET and at this point we are becoming more and more convinced that she never did have stage IV disease. Of course her biopsy of the right ischium was negative, but it was hard to believe that there wasn't something there that the needle missed. At any rate that has not panned out. We are not seeing any evidence of metastases anywhere else, the original areas of concern for bony metastatic diseaseare either completely stable or have resolved, the area of "thickened tissue" in the left chest wall is proving to be just scar tissue secondary to surgery and radiation,she has normal tumor markers, and at this point I feel moderately comfortable in saying that she never did have stage IV disease.  Accordingly I think we should change the Xgeva to Prolia. She understands this is the same drug at half dose and given every 6 months instead of every month. She will have her first dose on May 5. She will be due for repeat bone density in  August of this year.  I don't think the TMJ problem has anything to do with any  of the other things that have been going on. This has been a difficult last year, including going through menopause with goserelin, etc. She certainly does have mild issues but I don't think it's related to either her cancer diagnosis or the Arimidex or denosumab treatments. She is working with Dr. Toy Cookey to bring that TMJ symptoms under control and things do seem to be getting better.  Accordingly we are switching gears. She will see me again in September, after her bone density, and then we will repeat her lower spine and pelvis plain films in December. Of course if she has any other symptoms of concern we would evaluate those aggressively.  Erin [name] has a good understanding of this plan. She agrees with it. She will call with any problems that may develop before the next visit here.  Chauncey Cruel, MD  07/28/2015

## 2015-08-03 ENCOUNTER — Telehealth: Payer: Self-pay | Admitting: Internal Medicine

## 2015-08-03 ENCOUNTER — Telehealth: Payer: Self-pay | Admitting: Oncology

## 2015-08-03 NOTE — Telephone Encounter (Signed)
Pt request to transfer from Dr Shawna Orleans to Dr. Sharlet Salina. Please advise.   # [phone]

## 2015-08-03 NOTE — Telephone Encounter (Signed)
Spoke with patient to confirm upcoming appts. Pt will call Dr Nathanial Millman office to sch appt due to previous PCP has released care. I was unable to sch appt so pt will call the office to explain to them her situation

## 2015-08-03 NOTE — Telephone Encounter (Signed)
Ok with me 

## 2015-08-07 NOTE — Telephone Encounter (Signed)
Fine but not taking inter-office transfers until July at least.

## 2015-08-07 NOTE — Telephone Encounter (Signed)
Left vm for pt to call back and schedule an appt

## 2015-08-24 ENCOUNTER — Other Ambulatory Visit: Payer: Self-pay | Admitting: *Deleted

## 2015-08-25 ENCOUNTER — Telehealth: Payer: Self-pay | Admitting: Oncology

## 2015-08-25 ENCOUNTER — Ambulatory Visit (HOSPITAL_BASED_OUTPATIENT_CLINIC_OR_DEPARTMENT_OTHER): Payer: BC Managed Care – PPO

## 2015-08-25 ENCOUNTER — Other Ambulatory Visit: Payer: BC Managed Care – PPO

## 2015-08-25 VITALS — BP 129/7 | HR 72 | Temp 98.2°F | Resp 20

## 2015-08-25 DIAGNOSIS — M8580 Other specified disorders of bone density and structure, unspecified site: Secondary | ICD-10-CM | POA: Diagnosis not present

## 2015-08-25 DIAGNOSIS — N8500 Endometrial hyperplasia, unspecified: Secondary | ICD-10-CM

## 2015-08-25 DIAGNOSIS — C50412 Malignant neoplasm of upper-outer quadrant of left female breast: Secondary | ICD-10-CM

## 2015-08-25 DIAGNOSIS — C50912 Malignant neoplasm of unspecified site of left female breast: Secondary | ICD-10-CM

## 2015-08-25 MED ORDER — DENOSUMAB 60 MG/ML ~~LOC~~ SOLN
60.0000 mg | Freq: Once | SUBCUTANEOUS | Status: AC
  Administered 2015-08-25: 60 mg via SUBCUTANEOUS
  Filled 2015-08-25: qty 1

## 2015-08-25 NOTE — Telephone Encounter (Signed)
cld pt nad left a message to come for labs @3 :30 inj @4 

## 2015-08-25 NOTE — Patient Instructions (Signed)
Denosumab injection What is this medicine? DENOSUMAB (den oh sue mab) slows bone breakdown. Prolia is used to treat osteoporosis in women after menopause and in men. Xgeva is used to prevent bone fractures and other bone problems caused by cancer bone metastases. Xgeva is also used to treat giant cell tumor of the bone. This medicine may be used for other purposes; ask your health care provider or pharmacist if you have questions. COMMON BRAND NAME(S): Prolia, XGEVA What should I tell my health care provider before I take this medicine? They need to know if you have any of these conditions: -dental disease -eczema -infection or history of infections -kidney disease or on dialysis -low blood calcium or vitamin D -malabsorption syndrome -scheduled to have surgery or tooth extraction -taking medicine that contains denosumab -thyroid or parathyroid disease -an unusual reaction to denosumab, other medicines, foods, dyes, or preservatives -pregnant or trying to get pregnant -breast-feeding How should I use this medicine? This medicine is for injection under the skin. It is given by a health care professional in a hospital or clinic setting. If you are getting Prolia, a special MedGuide will be given to you by the pharmacist with each prescription and refill. Be sure to read this information carefully each time. For Prolia, talk to your pediatrician regarding the use of this medicine in children. Special care may be needed. For Xgeva, talk to your pediatrician regarding the use of this medicine in children. While this drug may be prescribed for children as young as 13 years for selected conditions, precautions do apply. Overdosage: If you think you've taken too much of this medicine contact a poison control center or emergency room at once. Overdosage: If you think you have taken too much of this medicine contact a poison control center or emergency room at once. NOTE: This medicine is only for  you. Do not share this medicine with others. What if I miss a dose? It is important not to miss your dose. Call your doctor or health care professional if you are unable to keep an appointment. What may interact with this medicine? Do not take this medicine with any of the following medications: -other medicines containing denosumab This medicine may also interact with the following medications: -medicines that suppress the immune system -medicines that treat cancer -steroid medicines like prednisone or cortisone This list may not describe all possible interactions. Give your health care provider a list of all the medicines, herbs, non-prescription drugs, or dietary supplements you use. Also tell them if you smoke, drink alcohol, or use illegal drugs. Some items may interact with your medicine. What should I watch for while using this medicine? Visit your doctor or health care professional for regular checks on your progress. Your doctor or health care professional may order blood tests and other tests to see how you are doing. Call your doctor or health care professional if you get a cold or other infection while receiving this medicine. Do not treat yourself. This medicine may decrease your body's ability to fight infection. You should make sure you get enough calcium and vitamin D while you are taking this medicine, unless your doctor tells you not to. Discuss the foods you eat and the vitamins you take with your health care professional. See your dentist regularly. Brush and floss your teeth as directed. Before you have any dental work done, tell your dentist you are receiving this medicine. Do not become pregnant while taking this medicine or for 5 months after stopping   it. Women should inform their doctor if they wish to become pregnant or think they might be pregnant. There is a potential for serious side effects to an unborn child. Talk to your health care professional or pharmacist for more  information. What side effects may I notice from receiving this medicine? Side effects that you should report to your doctor or health care professional as soon as possible: -allergic reactions like skin rash, itching or hives, swelling of the face, lips, or tongue -breathing problems -chest pain -fast, irregular heartbeat -feeling faint or lightheaded, falls -fever, chills, or any other sign of infection -muscle spasms, tightening, or twitches -numbness or tingling -skin blisters or bumps, or is dry, peels, or red -slow healing or unexplained pain in the mouth or jaw -unusual bleeding or bruising Side effects that usually do not require medical attention (Report these to your doctor or health care professional if they continue or are bothersome.): -muscle pain -stomach upset, gas This list may not describe all possible side effects. Call your doctor for medical advice about side effects. You may report side effects to FDA at 1-800-FDA-1088. Where should I keep my medicine? This medicine is only given in a clinic, doctor's office, or other health care setting and will not be stored at home. NOTE: This sheet is a summary. It may not cover all possible information. If you have questions about this medicine, talk to your doctor, pharmacist, or health care provider.  2015, Elsevier/Gold Standard. (2011-10-07 12:37:47) Goserelin injection What is this medicine? GOSERELIN (GOE se rel in) is similar to a hormone found in the body. It lowers the amount of sex hormones that the body makes. Men will have lower testosterone levels and women will have lower estrogen levels while taking this medicine. In men, this medicine is used to treat prostate cancer; the injection is either given once per month or once every 12 weeks. A once per month injection (only) is used to treat women with endometriosis, dysfunctional uterine bleeding, or advanced breast cancer. This medicine may be used for other purposes;  ask your health care provider or pharmacist if you have questions. COMMON BRAND NAME(S): Zoladex What should I tell my health care provider before I take this medicine? They need to know if you have any of these conditions (some only apply to women): -diabetes -heart disease or previous heart attack -high blood pressure -high cholesterol -kidney disease -osteoporosis or low bone density -problems passing urine -spinal cord injury -stroke -tobacco smoker -an unusual or allergic reaction to goserelin, hormone therapy, other medicines, foods, dyes, or preservatives -pregnant or trying to get pregnant -breast-feeding How should I use this medicine? This medicine is for injection under the skin. It is given by a health care professional in a hospital or clinic setting. Men receive this injection once every 4 weeks or once every 12 weeks. Women will only receive the once every 4 weeks injection. Talk to your pediatrician regarding the use of this medicine in children. Special care may be needed. Overdosage: If you think you have taken too much of this medicine contact a poison control center or emergency room at once. NOTE: This medicine is only for you. Do not share this medicine with others. What if I miss a dose? It is important not to miss your dose. Call your doctor or health care professional if you are unable to keep an appointment. What may interact with this medicine? -female hormones like estrogen -herbal or dietary supplements like black cohosh, chasteberry,   or DHEA -female hormones like testosterone -prasterone This list may not describe all possible interactions. Give your health care provider a list of all the medicines, herbs, non-prescription drugs, or dietary supplements you use. Also tell them if you smoke, drink alcohol, or use illegal drugs. Some items may interact with your medicine. What should I watch for while using this medicine? Visit your doctor or health care  professional for regular checks on your progress. Your symptoms may appear to get worse during the first weeks of this therapy. Tell your doctor or healthcare professional if your symptoms do not start to get better or if they get worse after this time. Your bones may get weaker if you take this medicine for a long time. If you smoke or frequently drink alcohol you may increase your risk of bone loss. A family history of osteoporosis, chronic use of drugs for seizures (convulsions), or corticosteroids can also increase your risk of bone loss. Talk to your doctor about how to keep your bones strong. This medicine should stop regular monthly menstration in women. Tell your doctor if you continue to menstrate. Women should not become pregnant while taking this medicine or for 12 weeks after stopping this medicine. Women should inform their doctor if they wish to become pregnant or think they might be pregnant. There is a potential for serious side effects to an unborn child. Talk to your health care professional or pharmacist for more information. Do not breast-feed an infant while taking this medicine. Men should inform their doctors if they wish to father a child. This medicine may lower sperm counts. Talk to your health care professional or pharmacist for more information. What side effects may I notice from receiving this medicine? Side effects that you should report to your doctor or health care professional as soon as possible: -allergic reactions like skin rash, itching or hives, swelling of the face, lips, or tongue -bone pain -breathing problems -changes in vision -chest pain -feeling faint or lightheaded, falls -fever, chills -pain, swelling, warmth in the leg -pain, tingling, numbness in the hands or feet -signs and symptoms of low blood pressure like dizziness; feeling faint or lightheaded, falls; unusually weak or tired -stomach pain -swelling of the ankles, feet, hands -trouble passing  urine or change in the amount of urine -unusually high or low blood pressure -unusually weak or tired Side effects that usually do not require medical attention (report to your doctor or health care professional if they continue or are bothersome): -change in sex drive or performance -changes in breast size in both males and females -changes in emotions or moods -headache -hot flashes -irritation at site where injected -loss of appetite -skin problems like acne, dry skin -vaginal dryness This list may not describe all possible side effects. Call your doctor for medical advice about side effects. You may report side effects to FDA at 1-800-FDA-1088. Where should I keep my medicine? This drug is given in a hospital or clinic and will not be stored at home. NOTE: This sheet is a summary. It may not cover all possible information. If you have questions about this medicine, talk to your doctor, pharmacist, or health care provider.  2015, Elsevier/Gold Standard. (2013-06-15 11:10:35)  

## 2015-09-19 ENCOUNTER — Ambulatory Visit (INDEPENDENT_AMBULATORY_CARE_PROVIDER_SITE_OTHER): Payer: BC Managed Care – PPO | Admitting: Internal Medicine

## 2015-09-19 ENCOUNTER — Telehealth: Payer: Self-pay | Admitting: Internal Medicine

## 2015-09-19 ENCOUNTER — Encounter: Payer: Self-pay | Admitting: Internal Medicine

## 2015-09-19 VITALS — BP 126/76 | HR 84 | Temp 98.4°F | Resp 12 | Ht 65.0 in | Wt 163.8 lb

## 2015-09-19 DIAGNOSIS — E038 Other specified hypothyroidism: Secondary | ICD-10-CM | POA: Diagnosis not present

## 2015-09-19 DIAGNOSIS — Z0000 Encounter for general adult medical examination without abnormal findings: Secondary | ICD-10-CM

## 2015-09-19 DIAGNOSIS — M8580 Other specified disorders of bone density and structure, unspecified site: Secondary | ICD-10-CM

## 2015-09-19 DIAGNOSIS — I10 Essential (primary) hypertension: Secondary | ICD-10-CM | POA: Diagnosis not present

## 2015-09-19 DIAGNOSIS — E039 Hypothyroidism, unspecified: Secondary | ICD-10-CM

## 2015-09-19 MED ORDER — ACYCLOVIR 5 % EX OINT: 1.0000 "application " | TOPICAL_OINTMENT | CUTANEOUS | Status: DC

## 2015-09-19 MED ORDER — LEVOTHYROXINE SODIUM 75 MCG PO TABS: 75.0000 ug | ORAL_TABLET | Freq: Every day | ORAL | Status: DC

## 2015-09-19 NOTE — Progress Notes (Signed)
Pre visit review using our clinic review tool, if applicable. No additional management support is needed unless otherwise documented below in the visit note. 

## 2015-09-19 NOTE — Telephone Encounter (Signed)
Patient was told to come in for labs before cpe.  I have scheduled patients CPE for end of December.  Please make sure labs are entered into system.

## 2015-09-19 NOTE — Patient Instructions (Signed)
We have sent in the refills and will see you back for the physical.   It was good to meet you today, call us if needed sooner.   It is okay to take the vitamin D at any time.   I would not worry about the sleeping study at this time. If you start getting a lot of daytime drowsiness we can think about it again.

## 2015-09-21 NOTE — Assessment & Plan Note (Signed)
BP at goal on losartan 100 mg daily. Last CMP at goal without indication for change.

## 2015-09-21 NOTE — Assessment & Plan Note (Signed)
Synthroid 75 mcg daily and she wishes to skip labs today as stable for some time and no new symptoms. She does get frequent labs at oncology over the last year. Last TSH at goal without indication for change.

## 2015-09-21 NOTE — Progress Notes (Signed)
   Subjective:    Patient ID: Erin [name], female    DOB: 03/19/1961, 55 y.o.   MRN: [id number]  HPI The patient is a 55 YO female coming in for follow up of her thyroid (taking synthroid 75 mcg daily and no dose change in several years, no heat or cold intolerance), her blood pressure (controlled on losartan, not complicated, no side effects), and her osteopenia (taking vitamin D daily and getting enough dietary calcium, taking prolia twice yearly). No new concerns but is having some more hot flashes after her oophorectomy (done last fall).   Review of Systems  Constitutional: Negative for fever, activity change, appetite change, fatigue and unexpected weight change.  HENT: Negative.   Eyes: Negative.   Respiratory: Negative for cough, chest tightness and shortness of breath.   Cardiovascular: Negative for chest pain, palpitations and leg swelling.  Gastrointestinal: Negative for nausea, abdominal pain, diarrhea, constipation and abdominal distention.  Musculoskeletal: Negative.   Skin: Negative.   Neurological: Negative.   Psychiatric/Behavioral: Negative.       Objective:   Physical Exam  Constitutional: She is oriented to person, place, and time. She appears well-developed and well-nourished.  HENT:  Head: Normocephalic and atraumatic.  Eyes: EOM are normal.  Neck: Normal range of motion.  Cardiovascular: Normal rate and regular rhythm.   Pulmonary/Chest: Effort normal and breath sounds normal. No respiratory distress. She has no wheezes. She has no rales.  Abdominal: Soft. Bowel sounds are normal. She exhibits no distension. There is no tenderness. There is no rebound.  Musculoskeletal: She exhibits no edema.  Neurological: She is alert and oriented to person, place, and time.  Skin: Skin is warm and dry.  Psychiatric: She has a normal mood and affect.   Filed Vitals:   09/19/15 1507  BP: 126/76  Pulse: 84  Temp: 98.4 F (36.9 C)  TempSrc: Oral  Resp: 12  Height: 5'  5" (1.651 m)  Weight: 163 lb 12.8 oz (74.299 kg)  SpO2: 98%      Assessment & Plan:

## 2015-09-21 NOTE — Assessment & Plan Note (Signed)
Taking vitamin D daily and last level at goal, doing prolia twice yearly and given dietary calcium recommendations.

## 2015-09-22 ENCOUNTER — Ambulatory Visit: Payer: BC Managed Care – PPO

## 2015-09-28 NOTE — Telephone Encounter (Signed)
Labs placed.

## 2015-10-10 ENCOUNTER — Ambulatory Visit: Payer: BC Managed Care – PPO | Admitting: Adult Health

## 2015-10-26 ENCOUNTER — Ambulatory Visit: Payer: BC Managed Care – PPO | Admitting: Adult Health

## 2015-10-27 ENCOUNTER — Ambulatory Visit: Payer: BC Managed Care – PPO

## 2015-11-24 ENCOUNTER — Telehealth: Payer: Self-pay | Admitting: Internal Medicine

## 2015-11-24 ENCOUNTER — Ambulatory Visit: Payer: BC Managed Care – PPO

## 2015-11-24 NOTE — Telephone Encounter (Signed)
Patient states MD at [facility] is requesting her to have Bone Density done.  Provider told her to have this done at our office.  Can Dr. Sharlet Salina enter order for this?

## 2015-11-28 NOTE — Telephone Encounter (Signed)
Left message for patient to call back  

## 2015-11-28 NOTE — Telephone Encounter (Signed)
She is not due for screening bone density for her osteopenia. If she is due for a bone density for a cancer diagnosis her cancer MD should order with appropriate diagnosis or communicate with [place] the need to order.

## 2015-11-29 ENCOUNTER — Telehealth: Payer: Self-pay | Admitting: Geriatric Medicine

## 2015-11-29 NOTE — Telephone Encounter (Signed)
Left message informing patient that she can get her cancer doctor to order the DEXA with the appropriate dx for the test, or she can call us with the reason for the DEXA and we can order it.

## 2015-11-29 NOTE — Telephone Encounter (Signed)
Patient had DEXA on 12/12/2013. How often do patients get scans for osteopenia? Is it every 2 years? Please advise, thanks.

## 2015-11-29 NOTE — Telephone Encounter (Signed)
Pt called back. She is going to be in meetings all day this week and asked for you to call her back this morning if possible thanks.

## 2015-11-30 NOTE — Telephone Encounter (Signed)
As stated, we can discuss at wellness.

## 2015-11-30 NOTE — Telephone Encounter (Signed)
Patient is just asking for hew own knowledge. She would like to know how often a DEXA is supposed to be done when osteopenia is involved.

## 2015-11-30 NOTE — Telephone Encounter (Signed)
Left message informing patient.

## 2015-11-30 NOTE — Telephone Encounter (Signed)
As previously stated in a different phone note I would be happy to discuss with her at her well visit as well as other screening initiatives.

## 2015-12-05 ENCOUNTER — Other Ambulatory Visit: Payer: Self-pay | Admitting: Internal Medicine

## 2015-12-05 ENCOUNTER — Telehealth: Payer: Self-pay | Admitting: Emergency Medicine

## 2015-12-05 ENCOUNTER — Other Ambulatory Visit: Payer: Self-pay | Admitting: *Deleted

## 2015-12-05 DIAGNOSIS — Z5181 Encounter for therapeutic drug level monitoring: Secondary | ICD-10-CM

## 2015-12-05 DIAGNOSIS — Z79811 Long term (current) use of aromatase inhibitors: Principal | ICD-10-CM

## 2015-12-05 DIAGNOSIS — C50412 Malignant neoplasm of upper-outer quadrant of left female breast: Secondary | ICD-10-CM

## 2015-12-05 DIAGNOSIS — M8580 Other specified disorders of bone density and structure, unspecified site: Secondary | ICD-10-CM

## 2015-12-05 NOTE — Telephone Encounter (Signed)
Are you able to order this for the patient?

## 2015-12-05 NOTE — Telephone Encounter (Signed)
Left message for patient to call back to discuss note below about her bone density from Dr. Sharlet Salina.

## 2015-12-05 NOTE — Telephone Encounter (Signed)
Order has been placed per request of her cancer MD.

## 2015-12-05 NOTE — Telephone Encounter (Signed)
Patient aware.

## 2015-12-05 NOTE — Telephone Encounter (Signed)
As previously stated in several phone notes she would need to discuss. If the cancer center wants her to have it they can order. Otherwise she can see [place] for visit to discuss.

## 2015-12-05 NOTE — Telephone Encounter (Addendum)
Reviewed bone density from 2015 and lowest score is -1.2 which does not meet criteria for repeat in 2 years. That would be recommended screening in 3-5 years. If this is being done for her cancer medications her cancer doctor should order.

## 2015-12-05 NOTE — Telephone Encounter (Signed)
Pt called and still wanting a bone density test. She states the cancer center was supposed to have calling with the  diagnoses and diagnoses code. She wants to know what the next steps after this would be. Please give her a call back and follow up thanks.

## 2015-12-05 NOTE — Telephone Encounter (Signed)
Spoke to the radiology dept downstairs and they said that the DEXA orders can only come from [facility] only.

## 2015-12-05 NOTE — Telephone Encounter (Signed)
[facility] called with diagnoses code for patients bone density test.   1- Z78.0 2- Z79.811 3- M85.80

## 2015-12-13 ENCOUNTER — Ambulatory Visit (INDEPENDENT_AMBULATORY_CARE_PROVIDER_SITE_OTHER)
Admission: RE | Admit: 2015-12-13 | Discharge: 2015-12-13 | Disposition: A | Discharge status: Home / Self Care | Payer: BC Managed Care – PPO | Source: Ambulatory Visit | Attending: Internal Medicine | Admitting: Internal Medicine

## 2015-12-13 DIAGNOSIS — Z5181 Encounter for therapeutic drug level monitoring: Secondary | ICD-10-CM

## 2015-12-13 DIAGNOSIS — Z79811 Long term (current) use of aromatase inhibitors: Secondary | ICD-10-CM

## 2015-12-16 DIAGNOSIS — Z5181 Encounter for therapeutic drug level monitoring: Secondary | ICD-10-CM | POA: Diagnosis not present

## 2015-12-16 DIAGNOSIS — Z79811 Long term (current) use of aromatase inhibitors: Secondary | ICD-10-CM | POA: Diagnosis not present

## 2015-12-19 ENCOUNTER — Encounter: Payer: Self-pay | Admitting: Oncology

## 2015-12-22 ENCOUNTER — Ambulatory Visit: Payer: BC Managed Care – PPO

## 2016-01-02 ENCOUNTER — Other Ambulatory Visit (HOSPITAL_BASED_OUTPATIENT_CLINIC_OR_DEPARTMENT_OTHER): Payer: BC Managed Care – PPO

## 2016-01-02 ENCOUNTER — Ambulatory Visit (HOSPITAL_BASED_OUTPATIENT_CLINIC_OR_DEPARTMENT_OTHER): Payer: BC Managed Care – PPO | Admitting: Oncology

## 2016-01-02 ENCOUNTER — Telehealth: Payer: Self-pay | Admitting: Oncology

## 2016-01-02 VITALS — BP 151/80 | HR 79 | Temp 98.2°F | Resp 18 | Ht 65.0 in | Wt 165.7 lb

## 2016-01-02 DIAGNOSIS — Z170 Estrogen receptor positive status [ER+]: Secondary | ICD-10-CM

## 2016-01-02 DIAGNOSIS — C50412 Malignant neoplasm of upper-outer quadrant of left female breast: Secondary | ICD-10-CM | POA: Diagnosis not present

## 2016-01-02 DIAGNOSIS — C50912 Malignant neoplasm of unspecified site of left female breast: Secondary | ICD-10-CM

## 2016-01-02 DIAGNOSIS — Z79811 Long term (current) use of aromatase inhibitors: Secondary | ICD-10-CM

## 2016-01-02 DIAGNOSIS — M8580 Other specified disorders of bone density and structure, unspecified site: Secondary | ICD-10-CM | POA: Diagnosis not present

## 2016-01-02 LAB — COMPREHENSIVE METABOLIC PANEL
ALK PHOS: 65 U/L (ref 40–150)
ALT: 15 U/L (ref 0–55)
AST: 19 U/L (ref 5–34)
Albumin: 3.9 g/dL (ref 3.5–5.0)
Anion Gap: 9 mEq/L (ref 3–11)
BUN: 20.9 mg/dL (ref 7.0–26.0)
CHLORIDE: 102 meq/L (ref 98–109)
CO2: 25 meq/L (ref 22–29)
Calcium: 9.6 mg/dL (ref 8.4–10.4)
Creatinine: 0.9 mg/dL (ref 0.6–1.1)
EGFR: 73 mL/min/{1.73_m2} — AB (ref >90)
GLUCOSE: 110 mg/dL (ref 70–140)
POTASSIUM: 4.3 meq/L (ref 3.5–5.1)
SODIUM: 135 meq/L — AB (ref 136–145)
Total Bilirubin: 1.43 mg/dL — ABNORMAL HIGH (ref 0.20–1.20)
Total Protein: 7.2 g/dL (ref 6.4–8.3)

## 2016-01-02 LAB — CBC WITH DIFFERENTIAL/PLATELET
BASO%: 0.7 % (ref 0.0–2.0)
Basophils Absolute: 0 10*3/uL (ref 0.0–0.1)
EOS ABS: 0.1 10*3/uL (ref 0.0–0.5)
EOS%: 1.7 % (ref 0.0–7.0)
HCT: 44.5 % (ref 34.8–46.6)
HGB: 14.8 g/dL (ref 11.6–15.9)
LYMPH%: 24.6 % (ref 14.0–49.7)
MCH: 28.9 pg (ref 25.1–34.0)
MCHC: 33.2 g/dL (ref 31.5–36.0)
MCV: 87.1 fL (ref 79.5–101.0)
MONO#: 0.5 10*3/uL (ref 0.1–0.9)
MONO%: 7.4 % (ref 0.0–14.0)
NEUT%: 65.6 % (ref 38.4–76.8)
NEUTROS ABS: 4.6 10*3/uL (ref 1.5–6.5)
Platelets: 214 10*3/uL (ref 145–400)
RBC: 5.1 10*6/uL (ref 3.70–5.45)
RDW: 12.7 % (ref 11.2–14.5)
WBC: 7 10*3/uL (ref 3.9–10.3)
lymph#: 1.7 10*3/uL (ref 0.9–3.3)

## 2016-01-02 NOTE — Telephone Encounter (Signed)
appt made and avs printed °

## 2016-01-02 NOTE — Progress Notes (Signed)
Erin [name]  MR#: [id number]    PCP: Myrlene Broker, MD GYN: Marcie Bal SU: Emelia Loron OTHER MD: Campbell Stall, [address], JN Mann, Chipper Herb, Wynetta Emery, Saima [place] DDS CHIEF COMPLAINT: Recurrent breast cancer  CURRENT THERAPY: anastrozole; denosumab    BREAST CANCER HISTORY From the original intake note 04/07/2011:  [name] had routine screening mammography December of 2009 at the [facility] in [place], [place], showing an increasing asymmetry in her left breast. She was recalled for additional views December 29. She was noted to have heterogeneously dense breasts. It was a 5 cm area of architectural distortion in the lateral aspect of the left breast with no associated calcifications. There was a second lesion medial to this. Ultrasound showed a highly suspicious hypoechoic irregularly marginated mass measuring 3 cm at the 2:30 position 5 cm from the nipple. This was palpable to the mammographer. The second area in question I measured 7 mm and a third lesion was noted measuring 5 mm. Some left axillary lymph nodes were morphologically normal.  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 Bilateral breast MRIs were performed 05/04/2008 and showed, in the left breast, a large lobulated mass measuring up to 4.3 cm, and including both of the apparently separate masses the previously biopsied. In the right breast there were 2 indeterminate lesions. These were evaluated further with breast specific gamma imaging performed January 14 in both lesions were negative. The lesion in the left breast was markedly abnormal.  Given this complex history and with the background of significant breast density, the patient opted for bilateral mastectomies with left sentinel lymph node sampling. This was  performed of 06/27/2008 and showed([id number]) in the right breast, no malignancy. In the left breast there was a 3.2 cm invasive ductal carcinoma, grade 1, focally extending to the anterior margin of the lower outer quadrant. There was extensive angiolymphatic invasion, but both sentinel lymph nodes on the left were negative.  The patient received left-sided postmastectomy radiation including of the left chest wall and left supraclavicular fossa to a total dose of 50.4 Gy plus a 10 Gy scar boost. She had an Oncotype DX recurrence score of 17, further discussed below. She decided to forego reconstruction. She was tested for BRCA1 and 2 and was found to be negative. Given her overall prognosis, she did not receive chemotherapy, but started tamoxifen February of 2010, with good tolerance.  RECURRENT DISEASE: SUMMARY OF INITIAL PRESENTATION  Erin [name] took tamoxifen for 5 years with no evidence of active disease area did in May 2015 we decided to stop the tamoxifen because of problems with endometrial hyperplasia. 6 months later, at the November visit, she was found to have a change in a small area of scaring her left inframammary fold. She was referred to dermatology and biopsy 04/21/2014 showed ([id number]) recurrent ductal adenocarcinoma (positive for gross cystic disease fluid protein, estrogen, and progesterone). She then underwent a staging PET scan in [facility] 05/06/2014. This showed a 1.9 cm sclerotic lesion in the right ischial tuberosity, with a maximum standard uptake value of 4.6. A sclerotic lesion in the left side of the L3 vertebral body measuring 1.6 cm had no hypermetabolic activity.  Her subsequent history is as detailed below.  INTERVAL HISTORY: Erin [name] returns today for follow-up of her estrogen receptor positive breast cancer. She continues on  anastrozole, which she generally tolerates well. She does have some hot flashes. Of course we are concerned about bone density issues  with aromatase inhibitors and she just had a repeat bone density which shows improvement. We have decreased her denosumab from 120 mg monthly 2 mg every 6 months her next dose is due in November.  REVIEW OF SYSTEMS: Tamya continues to work full-time and does a lot of traveling. She has a history of low back pain and this is a little bit more active right now. She has gained some weight. She has some knee pain. Sometimes she still has pain in the surgical breast as well. Aside from these issues a detailed review of systems today was noncontributory.  Past Medical History:  Diagnosis Date  . Allergic rhinitis due to pollen   . Breast cancer ([facility]) 2010  . Breast cancer ([facility]) 2016   reoccurrence in scar  . Hypertension   . Hypothyroidism   . Osteopenia   . S/P radiation therapy 07/12/2014 through 08/16/2014    Left chest wall 4000 cGy in 20 sessions, left chest wall boost 1000 cGy in 5 sessions  . Wears glasses   Diverticulosis       Past Surgical History:  Procedure Laterality Date  . BREAST LUMPECTOMY Left 06/01/2014   Procedure: WIDE LOCAL EXCISION OF RECURRENT LEFT BREAST CANCER;  Surgeon: Rolm Bookbinder, MD;  Location: [facility];  Service: General;  Laterality: Left;  . COLONOSCOPY    . cyst removed     left lower jaw - at age 3 yr  . DILATATION & CURRETTAGE/HYSTEROSCOPY WITH RESECTOCOPE N/A 09/16/2013   Procedure: DILATATION & CURETTAGE/HYSTEROSCOPY WITH RESECTOCOPE;  Surgeon: Princess Bruins, MD;  Location: [facility];  Service: Gynecology;  Laterality: N/A;  1 hr.  . INGUINAL HERNIA REPAIR     right side as infant  . LAPAROSCOPIC PELVIC LYMPH NODE BIOPSY    . MASTECTOMY  2010   bilateral mastectomies-lt snbx-radiation post  . MASTECTOMY Bilateral   . OOPHORECTOMY  2016   patient denies  . Right knee arthroscopy  2008  . ROBOTIC ASSISTED BILATERAL SALPINGO OOPHERECTOMY Bilateral 03/13/2015   Procedure:  ROBOTIC ASSISTED BILATERAL SALPINGO OOPHORECTOMY With [place];  Surgeon: Princess Bruins, MD;  Location: [facility];  Service: Gynecology;  Laterality: Bilateral;  . TONSILLECTOMY    . WISDOM TOOTH EXTRACTION      FAMILY HISTORY The patient's mother died in 8101 from complications of lung cancer. The patient has not been in touch with her father her for approximately 30 years. She had no sisters, 1 brother, who is in good health. There is no breast or ovarian cancer in the family to her knowledge.  GYNECOLOGIC HISTORY:  GX P0, menarche at around age 76, the patient continued to have periods despite being on tamoxifen, although more irregularly. 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 Her work involves quite a bit of travel.     ADVANCED DIRECTIVES: not in place  HEALTH MAINTENANCE:      Social History  Substance Use Topics  . Smoking status: Never Smoker  . Smokeless tobacco: Never Used  . Alcohol use 0.0 oz/week     Comment: rare      Colonoscopy: December 2013 Collene Mares)  PAP: Feb 2011  Bone density: 06/2009, normal  Cholesterol: "good"      Allergies  Allergen Reactions  . Pollen Extract   . Adhesive [Tape] Rash  MEDICATIONS:    Current Outpatient Prescriptions  Medication Sig Dispense Refill  . acetaminophen (TYLENOL) 500 MG tablet Take 500 mg by mouth every 6 (six) hours as needed for moderate pain or headache.    Marland Kitchen acyclovir ointment (ZOVIRAX) 5 % Apply 1 application topically every 3 (three) hours. As needed 5 g 5  . anastrozole (ARIMIDEX) 1 MG tablet Take 1 tablet (1 mg total) by mouth daily. 90 tablet 4  . Cholecalciferol (VITAMIN D3) 2000 UNITS capsule Take 2,000 Units by mouth daily.      Marland Kitchen ibuprofen (ADVIL,MOTRIN) 200 MG tablet Take 200 mg by mouth every 6 (six) hours as needed for moderate pain.    Marland Kitchen levothyroxine (SYNTHROID, LEVOTHROID) 75 MCG tablet Take 1 tablet (75 mcg total) by  mouth daily. 90 tablet 3  . loratadine (CLARITIN) 10 MG tablet Take 10 mg by mouth daily as needed for allergies.     Marland Kitchen losartan (COZAAR) 100 MG tablet Take 1 tablet (100 mg total) by mouth daily. 90 tablet 4  . Polyethyl Glycol-Propyl Glycol (SYSTANE OP) Apply 1 drop to eye as needed (for allergies).    . sodium chloride (OCEAN) 0.65 % SOLN nasal spray Place 1 spray into both nostrils as needed for congestion.     No current facility-administered medications for this visit.     OBJECTIVE:  Middle-aged white woman who appears stated age    35:   01/02/16 1537  BP: (!) 151/80  Pulse: 79  Resp: 18  Temp: 98.2 F (36.8 C)     Body mass index is 27.57 kg/m.     ECOG performance status: 0  Sclerae unicteric, EOMs intact Oropharynx clear and moist No cervical or supraclavicular adenopathy Lungs no rales or rhonchi Heart regular rate and rhythm Abd soft, nontender, positive bowel sounds MSK no focal spinal tenderness, no upper extremity lymphedema Neuro: nonfocal, well oriented, appropriate affect Breasts: Status post bilateral mastectomies. There is no evidence of chest wall recurrence. Both axillae are benign.  LABS:           Chemistry      Component Value Date/Time   NA 135 (L) 01/02/2016 1523   K 4.3 01/02/2016 1523   CL 101 08/10/2014 1008   CL 105 08/10/2012 0944   CO2 25 01/02/2016 1523   BUN 20.9 01/02/2016 1523   CREATININE 0.9 01/02/2016 1523      Component Value Date/Time   CALCIUM 9.6 01/02/2016 1523   ALKPHOS 65 01/02/2016 1523   AST 19 01/02/2016 1523   ALT 15 01/02/2016 1523   BILITOT 1.43 (H) 01/02/2016 1523         Lab Results  Component Value Date   WBC 7.0 01/02/2016   HGB 14.8 01/02/2016   HCT 44.5 01/02/2016   MCV 87.1 01/02/2016   PLT 214 01/02/2016   NEUTROABS 4.6 01/02/2016    STUDIES  Dg Bone Density  Result Date: 12/16/2015 Date of study: 12/13/15 Exam: DUAL X-RAY ABSORPTIOMETRY (DXA) FOR BONE MINERAL DENSITY (BMD)  Instrument: [employer] Provider: PCP Indication: follow up for low BMD Comparison: none (please note that it is not possible to compare data from different instruments) Clinical data: Pt is a 55 y.o. female with no previous history of fracture. On calcium and vitamin D. Results:  Lumbar spine (L1-L4) Femoral neck (FN) 33% distal radius T-score 0.1 RFN:-1.1 LFN:-0.8 n/a Change in BMD from previous DXA test (%) Up 8.8% Up 1.9% n/a (*) statistically significant Assessment: the BMD is  low according to the [facility] classification for osteoporosis (see below). Fracture risk: moderate FRAX score: not calculated in light of current medications . Comments: the technical quality of the study is good. Evaluation for secondary causes should be considered if clinically indicated. Recommend optimizing calcium (1200 mg/day) and vitamin D (800 IU/day) intake. Followup: Repeat BMD is appropriate after 2 years or after 1-2 years if starting treatment. WHO criteria for diagnosis of osteoporosis in postmenopausal women and in men 53 y/o or older: - normal: T-score -1.0 to + 1.0 - osteopenia/low bone density: T-score between -2.5 and -1.0 - osteoporosis: T-score below -2.5 - severe osteoporosis: T-score below -2.5 with history of fragility fracture Note: although not part of the WHO classification, the presence of a fragility fracture, regardless of the T-score, should be considered diagnostic of osteoporosis, provided other causes for the fracture have been excluded. Treatment: The National Osteoporosis Foundation recommends that treatment be considered in postmenopausal women and men age 16 or older with: 1. Hip or vertebral (clinical or morphometric) fracture 2. T-score of - 2.5 or lower at the spine or hip 3. 10-year fracture probability by FRAX of at least 20% for a major osteoporotic fracture and 3% for a hip fracture Loura Pardon MD     ASSESSMENT: 55 y.o.  BRCA negative [place] woman ,  (1) status post  bilateral mastectomies January 2010 for a left-sided upper outer quadrant T2 N0 (stage IIA) invasive ductal carcinoma, grade 1, strongly estrogen and progesterone receptor positive, HER-2 nonamplified,   (2) Oncotype DX recurrence score of 17, predicting a 10-12% distant recurrence risk over 10 years if her only adjuvant treatment is tamoxifen for 5 years  (3) status post Left postmastectomy radiation including Left supraclavicular fossa (5040 cGy in 28 sessions)  (4) on tamoxifen starting February of 2010, stopped May 2015 because of endometrial hyperplasia  (5) osteopenia-- T score -1.2 on repeated bone density scans 09/24/2011 and 12/12/2013  RECURRENT DISEASE (6) skin biopsy of a lesion under the left breast scar 04/21/2014 shows adenocarcinoma, gross cystic disease fluid protein, estrogen and progesterone receptor positive  (a) s/p wide excision with negative though close margins 06/01/2014, prognostic panel pending  (b) radiation to Left chest wall 4000 cGy in 20 sessions, left schest wall boost 1000 cGy in 5 session 07/12/2014 through 08/16/2014  (7) PET scan and bone scan show only one additional area of concern, in the right ischiial tuberosity--  (a) biopsy 05/24/2014 showed no evidence of malignancy  (b) zolendronate started 06/06/2014, poorly tolerated  (c) denosumab/ Xgeva started 09/26/2014, switched to Prolia as of May 2017  (d) repeat bone scan 12/20/2014 shows no new areas of disease, and the previously biopsied area to be delivered  (e) restaging studies 07/07/2015 do not support a diagnosis of metastatic disease.  (8) goserelin monthly started 05/09/2016, last dose 02/20/2015  (a) s/p BSO 03/13/2015 with benign pathology  (9) anastrozole started  08/29/2014  (a) bone density 12/13/2015 shows improvement, with the lumbar spine T score now at 0.1, the femoral neck -1.1, both increased   PLAN: [name] is doing well with her current treatment and the plan is to  continue anastrozole for a minimum of 10 years.  While on aromatase inhibitors it would be prudent to follow up with bone densities every 2 years. She will need her next one August 2019.  We have decreased her denosumab to Prolia doses. We are going to obtain a restaging PET scan in December. I am expecting that to showed no  evidence of disease. If so we can consider continuing to Prolia until the next bone density, and then discontinue it.  We discussed weight issues and also exercises for her back and knees.  She will see me again in January, after her December PET scan. She knows to call for any problems that may develop before her next visit here. Chauncey Cruel, MD  01/02/2016

## 2016-01-03 ENCOUNTER — Telehealth: Payer: Self-pay

## 2016-01-03 LAB — VITAMIN D 25 HYDROXY (VIT D DEFICIENCY, FRACTURES): Vitamin D, 25-Hydroxy: 31.1 ng/mL (ref 30.0–100.0)

## 2016-01-03 NOTE — Telephone Encounter (Signed)
lvm the rx for her bras and prosthetics is at reception for pick up.

## 2016-01-26 ENCOUNTER — Ambulatory Visit: Payer: BC Managed Care – PPO

## 2016-02-05 ENCOUNTER — Telehealth: Payer: Self-pay | Admitting: Internal Medicine

## 2016-02-05 MED ORDER — LEVOTHYROXINE SODIUM 50 MCG PO TABS: 50.0000 ug | ORAL_TABLET | Freq: Every day | ORAL | 1 refills | Status: DC

## 2016-02-05 NOTE — Telephone Encounter (Signed)
Patient aware.

## 2016-02-05 NOTE — Telephone Encounter (Signed)
Please review labs from 01/02/16 that were drawn at the cancer center.

## 2016-02-05 NOTE — Telephone Encounter (Signed)
Can make visit to discuss as this is not something easily explained over the phone. We are not basing this on the TSH which is the only thing which was checked last year.

## 2016-02-05 NOTE — Telephone Encounter (Signed)
On review of the labs I would recommend to decrease her thyroid dosing. Her free T4 is at the top of the range which is too high. Have sent in synthroid 50 mcg daily to switch to and needs labs rechecked in 6-8 weeks.

## 2016-02-05 NOTE — Telephone Encounter (Signed)
Patient had a T4 done on 01/02/2016 at the cancer center. She states that she has not heard about the results. I see that there is a scanned document in the system, but no notes at all. Please advise patient of results.

## 2016-02-05 NOTE — Telephone Encounter (Signed)
Patient says her number has not changed much since last year and she wants to know why you are changing the dose.

## 2016-02-07 NOTE — Telephone Encounter (Signed)
Left message for patient to call back  

## 2016-02-16 ENCOUNTER — Other Ambulatory Visit: Payer: Self-pay | Admitting: Oncology

## 2016-02-16 ENCOUNTER — Encounter: Payer: Self-pay | Admitting: Internal Medicine

## 2016-02-16 ENCOUNTER — Ambulatory Visit (INDEPENDENT_AMBULATORY_CARE_PROVIDER_SITE_OTHER): Payer: BC Managed Care – PPO | Admitting: Internal Medicine

## 2016-02-16 DIAGNOSIS — E038 Other specified hypothyroidism: Secondary | ICD-10-CM | POA: Diagnosis not present

## 2016-02-16 NOTE — Telephone Encounter (Signed)
Chart reviewed.

## 2016-02-16 NOTE — Progress Notes (Signed)
Pre visit review using our clinic review tool, if applicable. No additional management support is needed unless otherwise documented below in the visit note. 

## 2016-02-16 NOTE — Patient Instructions (Signed)
We will have you switch the dose and see you back in December

## 2016-02-18 NOTE — Assessment & Plan Note (Addendum)
Explained to her the difference in TSH and free T4 and about the risks of long term increase in bone loss and increase in rates of A fib from overtreatment of hypothyroidism and her free T4 of 1.58 is too high. She understands and agrees to go to 50 mcg daily dosing. We also talked about new ranges for TSH accepting up to 5 or higher with normal T4. Also explained how we need to recheck TSH and free T4 in 6-8 weeks and she can do this with her labs for physical in December.

## 2016-02-18 NOTE — Progress Notes (Signed)
   Subjective:    Patient ID: Erin [name], female    DOB: 26-Nov-1960, 55 y.o.   MRN: [id number]  HPI The patient is a 55 YO female coming in for follow up of some thyroid labs. She was advised to decrease her thyroid dosing after recent labs and she has some questions. She denies feeling constipation, tremors, weight change, heat or cold intolerance, hair thinning. She has been taking her medication separated from other pills or food for 30 minutes. NO change to her dosing recently she is taking 75 mcg daily.   Review of Systems  Constitutional: Negative.   Respiratory: Negative.   Cardiovascular: Negative.   Gastrointestinal: Negative.   Endocrine: Negative.   Neurological: Negative.       Objective:   Physical Exam  Constitutional: She is oriented to person, place, and time. She appears well-developed and well-nourished.  HENT:  Head: Normocephalic and atraumatic.  Cardiovascular: Normal rate and regular rhythm.   Pulmonary/Chest: Effort normal and breath sounds normal.  Abdominal: Soft. She exhibits no distension. There is no tenderness. There is no rebound.  Musculoskeletal: She exhibits no edema.  Neurological: She is alert and oriented to person, place, and time.  Skin: Skin is warm and dry.   Vitals:   02/16/16 1603  BP: 130/78  Pulse: 96  Resp: 16  Temp: 98.5 F (36.9 C)  TempSrc: Oral  SpO2: 98%  Weight: 161 lb 1.9 oz (73.1 kg)  Height: 5\' 5"  (1.651 m)      Assessment & Plan:

## 2016-02-23 ENCOUNTER — Ambulatory Visit: Payer: BC Managed Care – PPO

## 2016-03-01 ENCOUNTER — Ambulatory Visit (HOSPITAL_BASED_OUTPATIENT_CLINIC_OR_DEPARTMENT_OTHER): Payer: BC Managed Care – PPO

## 2016-03-01 ENCOUNTER — Other Ambulatory Visit (HOSPITAL_BASED_OUTPATIENT_CLINIC_OR_DEPARTMENT_OTHER): Payer: BC Managed Care – PPO

## 2016-03-01 VITALS — BP 146/74 | HR 91 | Temp 97.9°F | Resp 18

## 2016-03-01 DIAGNOSIS — M8580 Other specified disorders of bone density and structure, unspecified site: Secondary | ICD-10-CM | POA: Diagnosis not present

## 2016-03-01 DIAGNOSIS — C50912 Malignant neoplasm of unspecified site of left female breast: Secondary | ICD-10-CM

## 2016-03-01 DIAGNOSIS — N8500 Endometrial hyperplasia, unspecified: Secondary | ICD-10-CM

## 2016-03-01 DIAGNOSIS — C50412 Malignant neoplasm of upper-outer quadrant of left female breast: Secondary | ICD-10-CM | POA: Diagnosis not present

## 2016-03-01 LAB — COMPREHENSIVE METABOLIC PANEL
ALBUMIN: 3.4 g/dL — AB (ref 3.5–5.0)
ALK PHOS: 86 U/L (ref 40–150)
ALT: 8 U/L (ref 0–55)
ANION GAP: 9 meq/L (ref 3–11)
AST: 14 U/L (ref 5–34)
BILIRUBIN TOTAL: 0.8 mg/dL (ref 0.20–1.20)
BUN: 19.1 mg/dL (ref 7.0–26.0)
CO2: 24 mEq/L (ref 22–29)
Calcium: 9.9 mg/dL (ref 8.4–10.4)
Chloride: 104 mEq/L (ref 98–109)
Creatinine: 0.8 mg/dL (ref 0.6–1.1)
EGFR: 79 mL/min/{1.73_m2} — AB (ref >90)
GLUCOSE: 114 mg/dL (ref 70–140)
Potassium: 4.2 mEq/L (ref 3.5–5.1)
Sodium: 136 mEq/L (ref 136–145)
TOTAL PROTEIN: 6.7 g/dL (ref 6.4–8.3)

## 2016-03-01 LAB — CBC WITH DIFFERENTIAL/PLATELET
BASO%: 0.2 % (ref 0.0–2.0)
Basophils Absolute: 0 10*3/uL (ref 0.0–0.1)
EOS ABS: 0.2 10*3/uL (ref 0.0–0.5)
EOS%: 2.4 % (ref 0.0–7.0)
HEMATOCRIT: 40.3 % (ref 34.8–46.6)
HEMOGLOBIN: 14 g/dL (ref 11.6–15.9)
LYMPH#: 1.7 10*3/uL (ref 0.9–3.3)
LYMPH%: 17.1 % (ref 14.0–49.7)
MCH: 29.6 pg (ref 25.1–34.0)
MCHC: 34.7 g/dL (ref 31.5–36.0)
MCV: 85.2 fL (ref 79.5–101.0)
MONO#: 0.8 10*3/uL (ref 0.1–0.9)
MONO%: 7.6 % (ref 0.0–14.0)
NEUT%: 72.7 % (ref 38.4–76.8)
NEUTROS ABS: 7.2 10*3/uL — AB (ref 1.5–6.5)
PLATELETS: 206 10*3/uL (ref 145–400)
RBC: 4.73 10*6/uL (ref 3.70–5.45)
RDW: 12.1 % (ref 11.2–14.5)
WBC: 9.9 10*3/uL (ref 3.9–10.3)

## 2016-03-01 MED ORDER — DENOSUMAB 60 MG/ML ~~LOC~~ SOLN
60.0000 mg | Freq: Once | SUBCUTANEOUS | Status: AC
  Administered 2016-03-01: 60 mg via SUBCUTANEOUS
  Filled 2016-03-01: qty 1

## 2016-03-04 ENCOUNTER — Other Ambulatory Visit: Payer: Self-pay | Admitting: *Deleted

## 2016-03-04 DIAGNOSIS — C50912 Malignant neoplasm of unspecified site of left female breast: Secondary | ICD-10-CM

## 2016-03-04 DIAGNOSIS — R948 Abnormal results of function studies of other organs and systems: Secondary | ICD-10-CM

## 2016-03-04 DIAGNOSIS — C50412 Malignant neoplasm of upper-outer quadrant of left female breast: Secondary | ICD-10-CM

## 2016-03-12 ENCOUNTER — Other Ambulatory Visit: Payer: Self-pay | Admitting: *Deleted

## 2016-03-20 ENCOUNTER — Telehealth: Payer: Self-pay | Admitting: *Deleted

## 2016-03-20 ENCOUNTER — Other Ambulatory Visit: Payer: Self-pay | Admitting: Oncology

## 2016-03-20 NOTE — Telephone Encounter (Signed)
This RN contacted [facility] per Dr Jannifer Rodney states he has not received requested return call for Peer to Peer -   Requested return call - with MD name, direct cell contact number and days and times he is available.

## 2016-03-22 ENCOUNTER — Other Ambulatory Visit: Payer: Self-pay | Admitting: Oncology

## 2016-03-22 DIAGNOSIS — C50412 Malignant neoplasm of upper-outer quadrant of left female breast: Secondary | ICD-10-CM

## 2016-03-22 DIAGNOSIS — M899 Disorder of bone, unspecified: Secondary | ICD-10-CM | POA: Insufficient documentation

## 2016-03-22 DIAGNOSIS — C50912 Malignant neoplasm of unspecified site of left female breast: Secondary | ICD-10-CM

## 2016-03-22 NOTE — Progress Notes (Unsigned)
I discussed with Kymber is situation with Dr. evaluated at her insurance and she tells me there is a denial of the PET scan because they need abdominal bone films and other films that then if they show something that requires further evaluation of PET scan might be approved.  I discussed this with [name]. We are going to do an MRI of the lumbar spine. If this is stable we will probably stop looking. If there has been any change then we may have to go to a PET scan at that point.

## 2016-03-27 ENCOUNTER — Other Ambulatory Visit: Payer: Self-pay | Admitting: Medical Oncology

## 2016-04-05 ENCOUNTER — Ambulatory Visit (HOSPITAL_COMMUNITY)
Admission: RE | Admit: 2016-04-05 | Discharge: 2016-04-05 | Disposition: A | Discharge status: Home / Self Care | Payer: BC Managed Care – PPO | Source: Ambulatory Visit | Attending: Oncology | Admitting: Oncology

## 2016-04-05 ENCOUNTER — Telehealth: Payer: Self-pay | Admitting: *Deleted

## 2016-04-05 ENCOUNTER — Other Ambulatory Visit: Payer: Self-pay | Admitting: Oncology

## 2016-04-05 DIAGNOSIS — C50412 Malignant neoplasm of upper-outer quadrant of left female breast: Secondary | ICD-10-CM

## 2016-04-05 DIAGNOSIS — M899 Disorder of bone, unspecified: Secondary | ICD-10-CM

## 2016-04-05 DIAGNOSIS — C50912 Malignant neoplasm of unspecified site of left female breast: Secondary | ICD-10-CM

## 2016-04-05 NOTE — Telephone Encounter (Signed)
This RN per contact with [facility] - spoke with nurse to give additional clinical data to obtained PA for MRI of pelvis scheduled for today.  Post discussion including additional clinical data - request needs to be MD to MD ( or NP ) - call transferred to MD and Dr [name] was able to speak to Dr Rosanne Sack - authorization obtained and valid for 30 days.  Authorization number [id number].

## 2016-04-08 ENCOUNTER — Other Ambulatory Visit (INDEPENDENT_AMBULATORY_CARE_PROVIDER_SITE_OTHER): Payer: BC Managed Care – PPO

## 2016-04-08 ENCOUNTER — Other Ambulatory Visit: Payer: BC Managed Care – PPO

## 2016-04-08 DIAGNOSIS — Z0000 Encounter for general adult medical examination without abnormal findings: Secondary | ICD-10-CM | POA: Diagnosis not present

## 2016-04-08 DIAGNOSIS — E039 Hypothyroidism, unspecified: Secondary | ICD-10-CM | POA: Diagnosis not present

## 2016-04-08 LAB — COMPREHENSIVE METABOLIC PANEL
ALBUMIN: 4.1 g/dL (ref 3.5–5.2)
ALT: 10 U/L (ref 0–35)
AST: 17 U/L (ref 0–37)
Alkaline Phosphatase: 54 U/L (ref 39–117)
BUN: 16 mg/dL (ref 6–23)
CALCIUM: 9.1 mg/dL (ref 8.4–10.5)
CHLORIDE: 102 meq/L (ref 96–112)
CO2: 29 meq/L (ref 19–32)
CREATININE: 0.85 mg/dL (ref 0.40–1.20)
GFR: 73.97 mL/min (ref >60.00)
Glucose, Bld: 85 mg/dL (ref 70–99)
POTASSIUM: 4.5 meq/L (ref 3.5–5.1)
Sodium: 138 mEq/L (ref 135–145)
Total Bilirubin: 1.1 mg/dL (ref 0.2–1.2)
Total Protein: 6.6 g/dL (ref 6.0–8.3)

## 2016-04-08 LAB — LIPID PANEL
CHOL/HDL RATIO: 3
CHOLESTEROL: 152 mg/dL (ref 0–200)
HDL: 58.3 mg/dL (ref >39.00)
LDL CALC: 78 mg/dL (ref 0–99)
NonHDL: 93.86
TRIGLYCERIDES: 80 mg/dL (ref 0.0–149.0)
VLDL: 16 mg/dL (ref 0.0–40.0)

## 2016-04-08 LAB — CBC
HEMATOCRIT: 42.8 % (ref 36.0–46.0)
HEMOGLOBIN: 14.6 g/dL (ref 12.0–15.0)
MCHC: 34.2 g/dL (ref 30.0–36.0)
MCV: 86.7 fl (ref 78.0–100.0)
PLATELETS: 225 10*3/uL (ref 150.0–400.0)
RBC: 4.94 Mil/uL (ref 3.87–5.11)
RDW: 13.3 % (ref 11.5–15.5)
WBC: 5.2 10*3/uL (ref 4.0–10.5)

## 2016-04-08 LAB — TSH: TSH: 2.98 u[IU]/mL (ref 0.35–4.50)

## 2016-04-08 LAB — T4, FREE: Free T4: 1.05 ng/dL (ref 0.60–1.60)

## 2016-04-13 ENCOUNTER — Ambulatory Visit (HOSPITAL_COMMUNITY)
Admission: RE | Admit: 2016-04-13 | Discharge: 2016-04-13 | Disposition: A | Discharge status: Home / Self Care | Payer: BC Managed Care – PPO | Source: Ambulatory Visit | Attending: Oncology | Admitting: Oncology

## 2016-04-13 DIAGNOSIS — Z853 Personal history of malignant neoplasm of breast: Secondary | ICD-10-CM | POA: Insufficient documentation

## 2016-04-13 DIAGNOSIS — M899 Disorder of bone, unspecified: Secondary | ICD-10-CM | POA: Diagnosis present

## 2016-04-13 MED ORDER — GADOBENATE DIMEGLUMINE 529 MG/ML IV SOLN
15.0000 mL | Freq: Once | INTRAVENOUS | Status: AC | PRN
  Administered 2016-04-13: 15 mL via INTRAVENOUS

## 2016-04-14 ENCOUNTER — Ambulatory Visit (HOSPITAL_COMMUNITY): Admission: RE | Admit: 2016-04-14 | Payer: BC Managed Care – PPO | Source: Ambulatory Visit

## 2016-04-16 ENCOUNTER — Telehealth: Payer: Self-pay | Admitting: *Deleted

## 2016-04-16 NOTE — Telephone Encounter (Signed)
"  I had MRI on Saturday.  Was told the results would be ready within 24 - 48 hours.  Results have been read however ordering provider returns to office 04-19-2016. To receive results.  Will send request for results via rReturn call."  Next scheduled F/U is 05-06-2016.

## 2016-04-17 ENCOUNTER — Encounter: Payer: Self-pay | Admitting: Internal Medicine

## 2016-04-17 ENCOUNTER — Ambulatory Visit (INDEPENDENT_AMBULATORY_CARE_PROVIDER_SITE_OTHER): Payer: BC Managed Care – PPO | Admitting: Internal Medicine

## 2016-04-17 VITALS — BP 130/72 | HR 75 | Temp 98.0°F | Resp 14 | Ht 65.0 in | Wt 163.0 lb

## 2016-04-17 DIAGNOSIS — I10 Essential (primary) hypertension: Secondary | ICD-10-CM | POA: Diagnosis not present

## 2016-04-17 DIAGNOSIS — Z0000 Encounter for general adult medical examination without abnormal findings: Secondary | ICD-10-CM | POA: Diagnosis not present

## 2016-04-17 DIAGNOSIS — E039 Hypothyroidism, unspecified: Secondary | ICD-10-CM | POA: Diagnosis not present

## 2016-04-17 NOTE — Progress Notes (Signed)
   Subjective:    Patient ID: Erin [name], female    DOB: Sep 11, 1960, 55 y.o.   MRN: [id number]  HPI The patient is a 55 YO female coming in for wellness. No new concerns but questions about her thyroid medication.   PMH, [facility], social history reviewed and updated.   Review of Systems  Constitutional: Negative.   HENT: Negative.   Eyes: Negative.   Respiratory: Negative for cough, chest tightness and shortness of breath.   Cardiovascular: Negative for chest pain, palpitations and leg swelling.  Gastrointestinal: Negative for abdominal distention, abdominal pain, constipation, diarrhea, nausea and vomiting.  Musculoskeletal: Negative.   Skin: Negative.   Neurological: Negative.   Psychiatric/Behavioral: Negative.       Objective:   Physical Exam  Constitutional: She is oriented to person, place, and time. She appears well-developed and well-nourished.  HENT:  Head: Normocephalic and atraumatic.  Eyes: EOM are normal.  Neck: Normal range of motion.  Cardiovascular: Normal rate and regular rhythm.   Pulmonary/Chest: Effort normal and breath sounds normal. No respiratory distress. She has no wheezes. She has no rales.  Abdominal: Soft. Bowel sounds are normal. She exhibits no distension. There is no tenderness. There is no rebound.  Musculoskeletal: She exhibits no edema.  Neurological: She is alert and oriented to person, place, and time. Coordination normal.  Skin: Skin is warm and dry.  Psychiatric: She has a normal mood and affect.   Vitals:   04/17/16 0920  BP: 130/72  Pulse: 75  Resp: 14  Temp: 98 F (36.7 C)  TempSrc: Oral  SpO2: 99%  Weight: 163 lb (73.9 kg)  Height: 5\' 5"  (1.651 m)      Assessment & Plan:

## 2016-04-17 NOTE — Patient Instructions (Signed)
We will have you come back in about 1 month for the thyroid labs in the basement and we will get back to you on the results.   Health Maintenance, Female Introduction Adopting a healthy lifestyle and getting preventive care can go a long way to promote health and wellness. Talk with your health care provider about what schedule of regular examinations is right for you. This is a good chance for you to check in with your provider about disease prevention and staying healthy. In between checkups, there are plenty of things you can do on your own. Experts have done a lot of research about which lifestyle changes and preventive measures are most likely to keep you healthy. Ask your health care provider for more information. Weight and diet Eat a healthy diet  Be sure to include plenty of vegetables, fruits, low-fat dairy products, and lean protein.  Do not eat a lot of foods high in solid fats, added sugars, or salt.  Get regular exercise. This is one of the most important things you can do for your health.  Most adults should exercise for at least 150 minutes each week. The exercise should increase your heart rate and make you sweat (moderate-intensity exercise).  Most adults should also do strengthening exercises at least twice a week. This is in addition to the moderate-intensity exercise. Maintain a healthy weight  Body mass index (BMI) is a measurement that can be used to identify possible weight problems. It estimates body fat based on height and weight. Your health care provider can help determine your BMI and help you achieve or maintain a healthy weight.  For females 78 years of age and older:  A BMI below 18.5 is considered underweight.  A BMI of 18.5 to 24.9 is normal.  A BMI of 25 to 29.9 is considered overweight.  A BMI of 30 and above is considered obese. Watch levels of cholesterol and blood lipids  You should start having your blood tested for lipids and cholesterol at 54  years of age, then have this test every 5 years.  You may need to have your cholesterol levels checked more often if:  Your lipid or cholesterol levels are high.  You are older than 54 years of age.  You are at high risk for heart disease. Cancer screening Lung Cancer  Lung cancer screening is recommended for adults 78-70 years old who are at high risk for lung cancer because of a history of smoking.  A yearly low-dose CT scan of the lungs is recommended for people who:  Currently smoke.  Have quit within the past 15 years.  Have at least a 30-pack-year history of smoking. A pack year is smoking an average of one pack of cigarettes a day for 1 year.  Yearly screening should continue until it has been 15 years since you quit.  Yearly screening should stop if you develop a health problem that would prevent you from having lung cancer treatment. Breast Cancer  Practice breast self-awareness. This means understanding how your breasts normally appear and feel.  It also means doing regular breast self-exams. Let your health care provider know about any changes, no matter how small.  If you are in your 20s or 30s, you should have a clinical breast exam (CBE) by a health care provider every 1-3 years as part of a regular health exam.  If you are 1 or older, have a CBE every year. Also consider having a breast X-ray (mammogram) every year.  If you have a family history of breast cancer, talk to your health care provider about genetic screening.  If you are at high risk for breast cancer, talk to your health care provider about having an MRI and a mammogram every year.  Breast cancer gene (BRCA) assessment is recommended for women who have family members with BRCA-related cancers. BRCA-related cancers include:  Breast.  Ovarian.  Tubal.  Peritoneal cancers.  Results of the assessment will determine the need for genetic counseling and BRCA1 and BRCA2 testing. Cervical Cancer   Your health care provider may recommend that you be screened regularly for cancer of the pelvic organs (ovaries, uterus, and vagina). This screening involves a pelvic examination, including checking for microscopic changes to the surface of your cervix (Pap test). You may be encouraged to have this screening done every 3 years, beginning at age 47.  For women ages 55-65, health care providers may recommend pelvic exams and Pap testing every 3 years, or they may recommend the Pap and pelvic exam, combined with testing for human papilloma virus (HPV), every 5 years. Some types of HPV increase your risk of cervical cancer. Testing for HPV may also be done on women of any age with unclear Pap test results.  Other health care providers may not recommend any screening for nonpregnant women who are considered low risk for pelvic cancer and who do not have symptoms. Ask your health care provider if a screening pelvic exam is right for you.  If you have had past treatment for cervical cancer or a condition that could lead to cancer, you need Pap tests and screening for cancer for at least 20 years after your treatment. If Pap tests have been discontinued, your risk factors (such as having a new sexual partner) need to be reassessed to determine if screening should resume. Some women have medical problems that increase the chance of getting cervical cancer. In these cases, your health care provider may recommend more frequent screening and Pap tests. Colorectal Cancer  This type of cancer can be detected and often prevented.  Routine colorectal cancer screening usually begins at 54 years of age and continues through 54 years of age.  Your health care provider may recommend screening at an earlier age if you have risk factors for colon cancer.  Your health care provider may also recommend using home test kits to check for hidden blood in the stool.  A small camera at the end of a tube can be used to examine  your colon directly (sigmoidoscopy or colonoscopy). This is done to check for the earliest forms of colorectal cancer.  Routine screening usually begins at age 63.  Direct examination of the colon should be repeated every 5-10 years through 54 years of age. However, you may need to be screened more often if early forms of precancerous polyps or small growths are found. Skin Cancer  Check your skin from head to toe regularly.  Tell your health care provider about any new moles or changes in moles, especially if there is a change in a mole's shape or color.  Also tell your health care provider if you have a mole that is larger than the size of a Larock eraser.  Always use sunscreen. Apply sunscreen liberally and repeatedly throughout the day.  Protect yourself by wearing long sleeves, pants, a wide-brimmed hat, and sunglasses whenever you are outside. Heart disease, diabetes, and high blood pressure  High blood pressure causes heart disease and increases the risk  of stroke. High blood pressure is more likely to develop in:  People who have blood pressure in the high end of the normal range (130-139/85-89 mm Hg).  People who are overweight or obese.  People who are African American.  If you are 24-83 years of age, have your blood pressure checked every 3-5 years. If you are 53 years of age or older, have your blood pressure checked every year. You should have your blood pressure measured twice-once when you are at a hospital or clinic, and once when you are not at a hospital or clinic. Record the average of the two measurements. To check your blood pressure when you are not at a hospital or clinic, you can use:  An automated blood pressure machine at a pharmacy.  A home blood pressure monitor.  If you are between 15 years and 54 years old, ask your health care provider if you should take aspirin to prevent strokes.  Have regular diabetes screenings. This involves taking a blood sample  to check your fasting blood sugar level.  If you are at a normal weight and have a low risk for diabetes, have this test once every three years after 54 years of age.  If you are overweight and have a high risk for diabetes, consider being tested at a younger age or more often. Preventing infection Hepatitis B  If you have a higher risk for hepatitis B, you should be screened for this virus. You are considered at high risk for hepatitis B if:  You were born in a country where hepatitis B is common. Ask your health care provider which countries are considered high risk.  Your parents were born in a high-risk country, and you have not been immunized against hepatitis B (hepatitis B vaccine).  You have HIV or AIDS.  You use needles to inject street drugs.  You live with someone who has hepatitis B.  You have had sex with someone who has hepatitis B.  You get hemodialysis treatment.  You take certain medicines for conditions, including cancer, organ transplantation, and autoimmune conditions. Hepatitis C  Blood testing is recommended for:  Everyone born from [birth year] through 1964.  Anyone with known risk factors for hepatitis C. Sexually transmitted infections (STIs)  You should be screened for sexually transmitted infections (STIs) including gonorrhea and chlamydia if:  You are sexually active and are younger than 54 years of age.  You are older than 54 years of age and your health care provider tells you that you are at risk for this type of infection.  Your sexual activity has changed since you were last screened and you are at an increased risk for chlamydia or gonorrhea. Ask your health care provider if you are at risk.  If you do not have HIV, but are at risk, it may be recommended that you take a prescription medicine daily to prevent HIV infection. This is called pre-exposure prophylaxis (PrEP). You are considered at risk if:  You are sexually active and do not regularly  use condoms or know the HIV status of your partner(s).  You take drugs by injection.  You are sexually active with a partner who has HIV. Talk with your health care provider about whether you are at high risk of being infected with HIV. If you choose to begin PrEP, you should first be tested for HIV. You should then be tested every 3 months for as long as you are taking PrEP. Pregnancy  If you are  premenopausal and you may become pregnant, ask your health care provider about preconception counseling.  If you may become pregnant, take 400 to 800 micrograms (mcg) of folic acid every day.  If you want to prevent pregnancy, talk to your health care provider about birth control (contraception). Osteoporosis and menopause  Osteoporosis is a disease in which the bones lose minerals and strength with aging. This can result in serious bone fractures. Your risk for osteoporosis can be identified using a bone density scan.  If you are 28 years of age or older, or if you are at risk for osteoporosis and fractures, ask your health care provider if you should be screened.  Ask your health care provider whether you should take a calcium or vitamin D supplement to lower your risk for osteoporosis.  Menopause may have certain physical symptoms and risks.  Hormone replacement therapy may reduce some of these symptoms and risks. Talk to your health care provider about whether hormone replacement therapy is right for you. Follow these instructions at home:  Schedule regular health, dental, and eye exams.  Stay current with your immunizations.  Do not use any tobacco products including cigarettes, chewing tobacco, or electronic cigarettes.  If you are pregnant, do not drink alcohol.  If you are breastfeeding, limit how much and how often you drink alcohol.  Limit alcohol intake to no more than 1 drink per day for nonpregnant women. One drink equals 12 ounces of beer, 5 ounces of wine, or 1 ounces of  hard liquor.  Do not use street drugs.  Do not share needles.  Ask your health care provider for help if you need support or information about quitting drugs.  Tell your health care provider if you often feel depressed.  Tell your health care provider if you have ever been abused or do not feel safe at home. This information is not intended to replace advice given to you by your health care provider. Make sure you discuss any questions you have with your health care provider. Document Released: 10/22/2010 Document Revised: 09/14/2015 Document Reviewed: 01/10/2015  2017 [employer]

## 2016-04-17 NOTE — Assessment & Plan Note (Signed)
We discussed how her thyroid levels are more normal now and we will check again in about 6 weeks due to her change in energy level to make sure levels are not dropping too low. Taking synthroid 50 mcg daily.

## 2016-04-17 NOTE — Progress Notes (Signed)
Pre visit review using our clinic review tool, if applicable. No additional management support is needed unless otherwise documented below in the visit note. 

## 2016-04-17 NOTE — Assessment & Plan Note (Signed)
BP at goal on her losartan. When due for refill will come from [place] not her oncologist.

## 2016-04-17 NOTE — Assessment & Plan Note (Signed)
Flu shot done already, tetanus up to date. Mammogram and colonoscopy up to date. Recent derm visit with removal of non-cancerous lesion. Reminded about sun safety and mole surveillance. Counseled about the dangers of distracted driving.

## 2016-04-21 ENCOUNTER — Encounter: Payer: Self-pay | Admitting: Oncology

## 2016-05-06 ENCOUNTER — Other Ambulatory Visit (HOSPITAL_BASED_OUTPATIENT_CLINIC_OR_DEPARTMENT_OTHER): Payer: BC Managed Care – PPO

## 2016-05-06 ENCOUNTER — Ambulatory Visit (HOSPITAL_BASED_OUTPATIENT_CLINIC_OR_DEPARTMENT_OTHER): Payer: BC Managed Care – PPO | Admitting: Oncology

## 2016-05-06 VITALS — BP 140/74 | HR 77 | Temp 98.2°F | Resp 18 | Ht 65.0 in | Wt 162.1 lb

## 2016-05-06 DIAGNOSIS — Z170 Estrogen receptor positive status [ER+]: Secondary | ICD-10-CM

## 2016-05-06 DIAGNOSIS — C50412 Malignant neoplasm of upper-outer quadrant of left female breast: Secondary | ICD-10-CM

## 2016-05-06 DIAGNOSIS — Z79811 Long term (current) use of aromatase inhibitors: Secondary | ICD-10-CM | POA: Diagnosis not present

## 2016-05-06 DIAGNOSIS — M8580 Other specified disorders of bone density and structure, unspecified site: Secondary | ICD-10-CM | POA: Diagnosis not present

## 2016-05-06 DIAGNOSIS — C50912 Malignant neoplasm of unspecified site of left female breast: Secondary | ICD-10-CM

## 2016-05-06 LAB — CBC WITH DIFFERENTIAL/PLATELET
BASO%: 0.8 % (ref 0.0–2.0)
Basophils Absolute: 0 10*3/uL (ref 0.0–0.1)
EOS%: 1.7 % (ref 0.0–7.0)
Eosinophils Absolute: 0.1 10*3/uL (ref 0.0–0.5)
HCT: 43.9 % (ref 34.8–46.6)
HGB: 14.8 g/dL (ref 11.6–15.9)
LYMPH#: 1.5 10*3/uL (ref 0.9–3.3)
LYMPH%: 24.9 % (ref 14.0–49.7)
MCH: 29.5 pg (ref 25.1–34.0)
MCHC: 33.6 g/dL (ref 31.5–36.0)
MCV: 87.6 fL (ref 79.5–101.0)
MONO#: 0.5 10*3/uL (ref 0.1–0.9)
MONO%: 7.7 % (ref 0.0–14.0)
NEUT#: 3.9 10*3/uL (ref 1.5–6.5)
NEUT%: 64.9 % (ref 38.4–76.8)
Platelets: 224 10*3/uL (ref 145–400)
RBC: 5.01 10*6/uL (ref 3.70–5.45)
RDW: 13.1 % (ref 11.2–14.5)
WBC: 5.9 10*3/uL (ref 3.9–10.3)

## 2016-05-06 LAB — COMPREHENSIVE METABOLIC PANEL
ALT: 13 U/L (ref 0–55)
AST: 18 U/L (ref 5–34)
Albumin: 3.9 g/dL (ref 3.5–5.0)
Alkaline Phosphatase: 69 U/L (ref 40–150)
Anion Gap: 8 mEq/L (ref 3–11)
BUN: 16.3 mg/dL (ref 7.0–26.0)
CHLORIDE: 104 meq/L (ref 98–109)
CO2: 26 meq/L (ref 22–29)
CREATININE: 0.8 mg/dL (ref 0.6–1.1)
Calcium: 9.2 mg/dL (ref 8.4–10.4)
EGFR: 79 mL/min/{1.73_m2} — ABNORMAL LOW (ref >90)
GLUCOSE: 89 mg/dL (ref 70–140)
Potassium: 4.8 mEq/L (ref 3.5–5.1)
Sodium: 138 mEq/L (ref 136–145)
TOTAL PROTEIN: 7 g/dL (ref 6.4–8.3)
Total Bilirubin: 1.21 mg/dL — ABNORMAL HIGH (ref 0.20–1.20)

## 2016-05-06 NOTE — Progress Notes (Signed)
Erin [name]  MR#: [id number]    PCP: [name], MD GYN: Thomes Cake SU: Rolm Bookbinder OTHER MD: Crista Luria, [address], JN Mann, Arloa Koh, Bonner Puna, Saima Athar, Augustina Mood DDS CHIEF COMPLAINT: Recurrent breast cancer  CURRENT THERAPY: anastrozole; denosumab/Prolia  INTERVAL HISTORY: Erin [name] returns today for follow-up of her estrogen receptor positive recurrent breast cancer. She tolerates anastrozole well, with no significant problems with hot flashes or vaginal dryness. She is currently receiving denosumab/Prolia every 6 months. She is having no problems with that. Her last dose was in November 2017.  She will be due for repeat bone scan August 2019.  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 GU there walks or does her elliptical most days and also goes to the club at least twice a week. She tells me her Synthroid dose was recently dropped and that she has felt a little dull mentally since. She has also gained a little weight. She is working on these issues with Dr. Sharlet Salina. Aside from all this she denies pain, fever, rash, unexplained fatigue along or unexplained weight loss. A detailed review of systems was otherwise noncontributory  BREAST CANCER HISTORY From the original intake note 04/07/2011:  [name] had routine screening mammography December of 2009 at the [facility] in [place], [place], showing an increasing asymmetry in her left breast. She was recalled for additional views December 29. She was noted to have heterogeneously dense breasts. It was a 5 cm area of architectural distortion in the lateral aspect of the left breast with no associated calcifications. There was a second lesion medial to this. Ultrasound showed a highly suspicious hypoechoic irregularly marginated mass measuring 3 cm at the 2:30 position 5 cm from the nipple. This was palpable to the mammographer. The  second area in question I measured 7 mm and a third lesion was noted measuring 5 mm. Some left axillary lymph nodes were morphologically normal.  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 Bilateral breast MRIs were performed 05/04/2008 and showed, in the left breast, a large lobulated mass measuring up to 4.3 cm, and including both of the apparently separate masses the previously biopsied. In the right breast there were 2 indeterminate lesions. These were evaluated further with breast specific gamma imaging performed January 14 in both lesions were negative. The lesion in the left breast was markedly abnormal.  Given this complex history and with the background of significant breast density, the patient opted for bilateral mastectomies with left sentinel lymph node sampling. This was performed of 06/27/2008 and showed([id number]) in the right breast, no malignancy. In the left breast there was a 3.2 cm invasive ductal carcinoma, grade 1, focally extending to the anterior margin of the lower outer quadrant. There was extensive angiolymphatic invasion, but both sentinel lymph nodes on the left were negative.  The patient received left-sided postmastectomy radiation including of the left chest wall and left supraclavicular fossa to a total dose of 50.4 Gy plus a 10 Gy scar boost. She had an Oncotype DX recurrence score of 17, further discussed below. She decided to forego reconstruction. She was tested for BRCA1 and 2 and was found to be negative. Given her overall prognosis, she did not receive chemotherapy, but started tamoxifen February of 2010, with good tolerance.  RECURRENT DISEASE: SUMMARY  OF INITIAL PRESENTATION  Erin [name] took tamoxifen for 5 years with no evidence of active disease area did in May 2015 we decided to stop the tamoxifen because  of problems with endometrial hyperplasia. 6 months later, at the November visit, she was found to have a change in a small area of scaring her left inframammary fold. She was referred to dermatology and biopsy 04/21/2014 showed ([id number]) recurrent ductal adenocarcinoma (positive for gross cystic disease fluid protein, estrogen, and progesterone). She then underwent a staging PET scan in  regional 05/06/2014. This showed a 1.9 cm sclerotic lesion in the right ischial tuberosity, with a maximum standard uptake value of 4.6. A sclerotic lesion in the left side of the L3 vertebral body measuring 1.6 cm had no hypermetabolic activity.  Her subsequent history is as detailed below.   Past Medical History:  Diagnosis Date  . Allergic rhinitis due to pollen   . Breast cancer ([facility]) 2010  . Breast cancer ([facility]) 2016   reoccurrence in scar  . Hypertension   . Hypothyroidism   . Osteopenia   . S/P radiation therapy 07/12/2014 through 08/16/2014    Left chest wall 4000 cGy in 20 sessions, left chest wall boost 1000 cGy in 5 sessions  . Wears glasses   Diverticulosis       Past Surgical History:  Procedure Laterality Date  . BREAST LUMPECTOMY Left 06/01/2014   Procedure: WIDE LOCAL EXCISION OF RECURRENT LEFT BREAST CANCER;  Surgeon: Rolm Bookbinder, MD;  Location: [facility];  Service: General;  Laterality: Left;  . COLONOSCOPY    . cyst removed     left lower jaw - at age 15 yr  . DILATATION & CURRETTAGE/HYSTEROSCOPY WITH RESECTOCOPE N/A 09/16/2013   Procedure: DILATATION & CURETTAGE/HYSTEROSCOPY WITH RESECTOCOPE;  Surgeon: Princess Bruins, MD;  Location: [facility];  Service: Gynecology;  Laterality: N/A;  1 hr.  . INGUINAL HERNIA REPAIR     right side as infant  . LAPAROSCOPIC PELVIC LYMPH NODE BIOPSY    . MASTECTOMY  2010   bilateral mastectomies-lt snbx-radiation post  . MASTECTOMY Bilateral   . OOPHORECTOMY  2016    patient denies  . Right knee arthroscopy  2008  . ROBOTIC ASSISTED BILATERAL SALPINGO OOPHERECTOMY Bilateral 03/13/2015   Procedure: ROBOTIC ASSISTED BILATERAL SALPINGO OOPHORECTOMY With Washings;  Surgeon: Princess Bruins, MD;  Location: [facility];  Service: Gynecology;  Laterality: Bilateral;  . TONSILLECTOMY    . WISDOM TOOTH EXTRACTION      FAMILY HISTORY The patient's mother died in 2409 from complications of lung cancer. The patient has not been in touch with her father her for approximately 30 years. She had no sisters, 1 brother, who is in good health. There is no breast or ovarian cancer in the family to her knowledge.  GYNECOLOGIC HISTORY:  GX P0, menarche at around age 75, the patient continued to have periods despite being on tamoxifen, although more irregularly. 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 Her work involves quite a bit of travel.     ADVANCED DIRECTIVES: not in place  HEALTH MAINTENANCE:      Social History  Substance Use Topics  . Smoking status: Never Smoker  . Smokeless tobacco: Never Used  . Alcohol use 0.0 oz/week     Comment: rare      Colonoscopy: December 2013 Coll[facility] Mares)  PAP: Feb 2011  Bone density: 06/2009, normal  Cholesterol: "good"  Allergies  Allergen Reactions  . Pollen Extract   . Adhesive [Tape] Rash    MEDICATIONS:    Current Outpatient Prescriptions  Medication Sig Dispense Refill  . acetaminophen (TYLENOL) 500 MG tablet Take 500 mg by mouth every 6 (six) hours as needed for moderate pain or headache.    Marland Kitchen acyclovir ointment (ZOVIRAX) 5 % Apply 1 application topically every 3 (three) hours. As needed 5 g 5  . anastrozole (ARIMIDEX) 1 MG tablet Take 1 tablet (1 mg total) by mouth daily. 90 tablet 4  . Cholecalciferol (VITAMIN D3) 2000 UNITS capsule Take 2,000 Units by mouth daily.      Marland Kitchen ibuprofen (ADVIL,MOTRIN) 200 MG tablet Take 200 mg by mouth  every 6 (six) hours as needed for moderate pain.    Marland Kitchen levothyroxine (SYNTHROID, LEVOTHROID) 50 MCG tablet Take 1 tablet (50 mcg total) by mouth daily. (Patient taking differently: Take 75 mcg by mouth daily. ) 90 tablet 1  . loratadine (CLARITIN) 10 MG tablet Take 10 mg by mouth daily as needed for allergies.     Marland Kitchen losartan (COZAAR) 100 MG tablet TAKE 1 TABLET(100 MG) BY MOUTH DAILY 90 tablet 1  . Polyethyl Glycol-Propyl Glycol (SYSTANE OP) Apply 1 drop to eye as needed (for allergies).    . sodium chloride (OCEAN) 0.65 % SOLN nasal spray Place 1 spray into both nostrils as needed for congestion.     No current facility-administered medications for this visit.     OBJECTIVE:  Middle-aged white woman who appears Well    Vitals:   05/06/16 1452  BP: 140/74  Pulse: 77  Resp: 18  Temp: 98.2 F (36.8 C)     Body mass index is 26.97 kg/m.     ECOG performance status: 0  Sclerae unicteric, pupils round and equal Oropharynx clear and moist-- no thrush or other lesions No cervical or supraclavicular adenopathy Lungs no rales or rhonchi Heart regular rate and rhythm Abd soft, nontender, positive bowel sounds MSK no focal spinal tenderness, no upper extremity lymphedema Neuro: nonfocal, well oriented, appropriate affect Breasts: Status post bilateral mastectomies. There is no evidence of chest wall recurrence. Both axillae are benign.   LABS:           Chemistry      Component Value Date/Time   NA 138 04/08/2016 0933   NA 136 03/01/2016 1527   K 4.5 04/08/2016 0933   K 4.2 03/01/2016 1527   CL 102 04/08/2016 0933   CL 105 08/10/2012 0944   CO2 29 04/08/2016 0933   CO2 24 03/01/2016 1527   BUN 16 04/08/2016 0933   BUN 19.1 03/01/2016 1527   CREATININE 0.85 04/08/2016 0933   CREATININE 0.8 03/01/2016 1527      Component Value Date/Time   CALCIUM 9.1 04/08/2016 0933   CALCIUM 9.9 03/01/2016 1527   ALKPHOS 54 04/08/2016 0933   ALKPHOS 86 03/01/2016 1527   AST 17 04/08/2016  0933   AST 14 03/01/2016 1527   ALT 10 04/08/2016 0933   ALT 8 03/01/2016 1527   BILITOT 1.1 04/08/2016 0933   BILITOT 0.80 03/01/2016 1527         Lab Results  Component Value Date   WBC 5.9 05/06/2016   HGB 14.8 05/06/2016   HCT 43.9 05/06/2016   MCV 87.6 05/06/2016   PLT 224 05/06/2016   NEUTROABS 3.9 05/06/2016    STUDIES  Mr Pelvis W Wo Contrast  Result Date: 04/13/2016 CLINICAL DATA:  Patient's history of  breast cancer diagnosed in 2009. Sclerotic lesion in the right ischial tuberosity was biopsied and 2016. Biopsy was negative. EXAM: MRI PELVIS WITHOUT AND WITH CONTRAST TECHNIQUE: Multiplanar multisequence MR imaging of the pelvis was performed both before and after administration of intravenous contrast. CONTRAST:  15 ml MULTIHANCE GADOBENATE DIMEGLUMINE 529 MG/ML IV SOLN COMPARISON:  CT abdomen and pelvis 04/19/2015. MRI pelvis 05/20/2014. Whole-body bone scan 12/20/2014. FINDINGS: The T1 hypointense lesion in the right ischial tuberosity seen on the prior MRI is unchanged in size at 2.2 x 1.6 cm on image 22 of series 3. The lesion is well circumscribed and demonstrates new mild T2 hyperintensity. It does not enhance. Bone marrow signal is otherwise normal. Hazy sclerotic lesion in L3 seen on prior examinations is not visualized on this study. No new or enlarging lesion is identified. All imaged musculature is intact and normal in appearance. No fluid collection or mass is identified. Imaged intrapelvic contents are unremarkable. IMPRESSION: Lesion in the right ischial tuberosity seen on prior examinations is unchanged in size and no new lesions are identified. The examination is otherwise negative. Electronically Signed   By: Inge Rise M.D.   On: 04/13/2016 19:40     ASSESSMENT: 56 y.o.  BRCA negative [place] woman ,  (1) status post bilateral mastectomies January 2010 for a left-sided upper outer quadrant T2 N0 (stage IIA) invasive ductal carcinoma, grade 1,  strongly estrogen and progesterone receptor positive, HER-2 nonamplified,   (2) Oncotype DX recurrence score of 17, predicting a 10-12% distant recurrence risk over 10 years if her only adjuvant treatment is tamoxifen for 5 years  (3) status post Left postmastectomy radiation including Left supraclavicular fossa (5040 cGy in 28 sessions)  (4) on tamoxifen starting February of 2010, stopped May 2015 because of endometrial hyperplasia  (5) osteopenia-- T score -1.2 on repeated bone density scans 09/24/2011 and 12/12/2013  RECURRENT DISEASE (6) skin biopsy of a lesion under the left breast scar 04/21/2014 shows adenocarcinoma, gross cystic disease fluid protein, estrogen and progesterone receptor positive  (a) s/p wide excision with negative though close margins 06/01/2014, prognostic panel pending  (b) radiation to Left chest wall 4000 cGy in 20 sessions, left schest wall boost 1000 cGy in 5 session 07/12/2014 through 08/16/2014  (7) PET scan and bone scan show only one additional area of concern, in the right ischiial tuberosity--  (a) biopsy 05/24/2014 showed no evidence of malignancy  (b) zolendronate started 06/06/2014, poorly tolerated  (c) denosumab/ Xgeva started 09/26/2014, switched to Prolia as of May 2017  (d) repeat bone scan 12/20/2014 shows no new areas of disease, and the previously biopsied area to be delivered  (e) restaging studies 07/07/2015 do not support a diagnosis of metastatic disease.  (8) goserelin monthly started 05/09/2016, last dose 02/20/2015  (a) s/p BSO 03/13/2015 with benign pathology  (9) anastrozole started  08/29/2014  (a) bone density 12/13/2015 shows improvement, with the lumbar spine T score now at 0.1, the femoral neck -1.1, both increased   PLAN: We reviewed results of her recent MRI and they are very reassuring. The longer time passes without any evidence of other lesions that more likely it is that Erin [name] did not have metastatic disease to  the bone.  She is currently just over 2 years out from her documented local recurrence. There is no evidence of active disease. This is very favorable.  She tolerates the anastrozole well and we have switched her denosumab to every 6 months at Prolia doses. She tolerates that  also without any side effects that she is aware of. The plan is to continue the Prolia at least through the next bone density at which point we could consider discontinuing it. Of course we will continue the anastrozole for a minimum of 5 years  I encouraged her with continuing exercises as she is doing. She will see me again in May with her next Prolia dose. I will see [place] then see her again in November and at that time we will repeat the same MRI just performed if there has been no evidence of progression then we may revert to routine follow-up thereafter.  She has a good understanding of this plan and agrees with it. She knows to call for any problems that may develop before her next visit. Chauncey Cruel, MD  05/06/2016

## 2016-05-21 ENCOUNTER — Other Ambulatory Visit (INDEPENDENT_AMBULATORY_CARE_PROVIDER_SITE_OTHER): Payer: BC Managed Care – PPO

## 2016-05-21 DIAGNOSIS — E039 Hypothyroidism, unspecified: Secondary | ICD-10-CM | POA: Diagnosis not present

## 2016-05-21 LAB — T4, FREE: Free T4: 0.95 ng/dL (ref 0.60–1.60)

## 2016-05-21 LAB — TSH: TSH: 2.55 u[IU]/mL (ref 0.35–4.50)

## 2016-05-23 ENCOUNTER — Encounter: Payer: Self-pay | Admitting: Internal Medicine

## 2016-05-23 DIAGNOSIS — E039 Hypothyroidism, unspecified: Secondary | ICD-10-CM

## 2016-05-24 ENCOUNTER — Telehealth: Payer: Self-pay | Admitting: Emergency Medicine

## 2016-05-24 MED ORDER — LEVOTHYROXINE SODIUM 125 MCG PO TABS: 62.5000 ug | ORAL_TABLET | Freq: Every day | ORAL | 1 refills | Status: DC

## 2016-05-24 NOTE — Addendum Note (Signed)
Addended by: Pricilla Holm A on: 05/24/2016 03:22 PM   Modules accepted: Orders

## 2016-05-24 NOTE — Telephone Encounter (Signed)
There is not another alternative to go to an intermediate dose between 50 and 75 mcg and we would recommend to stay at the 50 mcg dose.

## 2016-05-24 NOTE — Telephone Encounter (Signed)
Pts prescription for levothyroxine (SYNTHROID, LEVOTHROID) 125 MCG tablet crumbles when breaking pill in half. They are wondering if they can switch the medication to something different. Please give them a call back. Thanks.

## 2016-05-27 NOTE — Telephone Encounter (Signed)
Called pharmacy to verify msg below the pharmacist stated do not want to change medication requesting to change the manufacturer Averyana Pillars. Currently using the Lennett Kelty Szafran and not able to cut pill in half effective. Can change to Mylan Damarian Priola it would be easier to cut. Inform Htet/pharmacist ok to change...Johny Chess

## 2016-07-23 ENCOUNTER — Other Ambulatory Visit (INDEPENDENT_AMBULATORY_CARE_PROVIDER_SITE_OTHER): Payer: BC Managed Care – PPO

## 2016-07-23 DIAGNOSIS — E039 Hypothyroidism, unspecified: Secondary | ICD-10-CM

## 2016-07-23 LAB — TSH: TSH: 3.18 u[IU]/mL (ref 0.35–4.50)

## 2016-07-23 LAB — T4, FREE: FREE T4: 1.04 ng/dL (ref 0.60–1.60)

## 2016-08-09 ENCOUNTER — Other Ambulatory Visit: Payer: Self-pay | Admitting: Oncology

## 2016-08-29 ENCOUNTER — Ambulatory Visit (HOSPITAL_BASED_OUTPATIENT_CLINIC_OR_DEPARTMENT_OTHER): Payer: BC Managed Care – PPO

## 2016-08-29 ENCOUNTER — Other Ambulatory Visit (HOSPITAL_BASED_OUTPATIENT_CLINIC_OR_DEPARTMENT_OTHER): Payer: BC Managed Care – PPO

## 2016-08-29 ENCOUNTER — Ambulatory Visit (HOSPITAL_BASED_OUTPATIENT_CLINIC_OR_DEPARTMENT_OTHER): Payer: BC Managed Care – PPO | Admitting: Oncology

## 2016-08-29 VITALS — BP 133/74 | HR 80 | Temp 97.9°F | Resp 18 | Ht 65.0 in | Wt 162.8 lb

## 2016-08-29 DIAGNOSIS — Z170 Estrogen receptor positive status [ER+]: Secondary | ICD-10-CM

## 2016-08-29 DIAGNOSIS — C50912 Malignant neoplasm of unspecified site of left female breast: Secondary | ICD-10-CM

## 2016-08-29 DIAGNOSIS — C50412 Malignant neoplasm of upper-outer quadrant of left female breast: Secondary | ICD-10-CM | POA: Diagnosis not present

## 2016-08-29 DIAGNOSIS — M8580 Other specified disorders of bone density and structure, unspecified site: Secondary | ICD-10-CM

## 2016-08-29 DIAGNOSIS — N8500 Endometrial hyperplasia, unspecified: Secondary | ICD-10-CM

## 2016-08-29 DIAGNOSIS — L719 Rosacea, unspecified: Secondary | ICD-10-CM | POA: Diagnosis not present

## 2016-08-29 DIAGNOSIS — Z79811 Long term (current) use of aromatase inhibitors: Secondary | ICD-10-CM | POA: Diagnosis not present

## 2016-08-29 LAB — CBC WITH DIFFERENTIAL/PLATELET
BASO%: 0.7 % (ref 0.0–2.0)
Basophils Absolute: 0 10*3/uL (ref 0.0–0.1)
EOS%: 1.6 % (ref 0.0–7.0)
Eosinophils Absolute: 0.1 10*3/uL (ref 0.0–0.5)
HEMATOCRIT: 43.6 % (ref 34.8–46.6)
HGB: 14.7 g/dL (ref 11.6–15.9)
LYMPH#: 1.5 10*3/uL (ref 0.9–3.3)
LYMPH%: 21.1 % (ref 14.0–49.7)
MCH: 29.3 pg (ref 25.1–34.0)
MCHC: 33.7 g/dL (ref 31.5–36.0)
MCV: 86.9 fL (ref 79.5–101.0)
MONO#: 0.5 10*3/uL (ref 0.1–0.9)
MONO%: 7.2 % (ref 0.0–14.0)
NEUT%: 69.4 % (ref 38.4–76.8)
NEUTROS ABS: 4.8 10*3/uL (ref 1.5–6.5)
Platelets: 234 10*3/uL (ref 145–400)
RBC: 5.02 10*6/uL (ref 3.70–5.45)
RDW: 12.6 % (ref 11.2–14.5)
WBC: 6.9 10*3/uL (ref 3.9–10.3)

## 2016-08-29 LAB — COMPREHENSIVE METABOLIC PANEL
ALBUMIN: 3.9 g/dL (ref 3.5–5.0)
ALT: 14 U/L (ref 0–55)
ANION GAP: 7 meq/L (ref 3–11)
AST: 18 U/L (ref 5–34)
Alkaline Phosphatase: 69 U/L (ref 40–150)
BILIRUBIN TOTAL: 1.3 mg/dL — AB (ref 0.20–1.20)
BUN: 29.3 mg/dL — ABNORMAL HIGH (ref 7.0–26.0)
CALCIUM: 9.6 mg/dL (ref 8.4–10.4)
CO2: 27 mEq/L (ref 22–29)
CREATININE: 1.2 mg/dL — AB (ref 0.6–1.1)
Chloride: 104 mEq/L (ref 98–109)
EGFR: 50 mL/min/{1.73_m2} — ABNORMAL LOW (ref >90)
Glucose: 103 mg/dl (ref 70–140)
Potassium: 4.6 mEq/L (ref 3.5–5.1)
Sodium: 138 mEq/L (ref 136–145)
TOTAL PROTEIN: 6.8 g/dL (ref 6.4–8.3)

## 2016-08-29 MED ORDER — ANASTROZOLE 1 MG PO TABS: 1.0000 mg | ORAL_TABLET | Freq: Every day | ORAL | 4 refills | Status: DC

## 2016-08-29 MED ORDER — VALACYCLOVIR HCL 500 MG PO TABS: 500.0000 mg | ORAL_TABLET | Freq: Every day | ORAL | 4 refills | Status: DC

## 2016-08-29 MED ORDER — DENOSUMAB 60 MG/ML ~~LOC~~ SOLN
60.0000 mg | Freq: Once | SUBCUTANEOUS | Status: AC
  Administered 2016-08-29: 60 mg via SUBCUTANEOUS
  Filled 2016-08-29: qty 1

## 2016-08-29 MED ORDER — METRONIDAZOLE 1 % EX GEL: Freq: Every day | CUTANEOUS | 0 refills | Status: DC

## 2016-08-29 NOTE — Patient Instructions (Signed)
Denosumab injection What is this medicine? DENOSUMAB (den oh sue mab) slows bone breakdown. Prolia is used to treat osteoporosis in women after menopause and in men. Xgeva is used to prevent bone fractures and other bone problems caused by cancer bone metastases. Xgeva is also used to treat giant cell tumor of the bone. This medicine may be used for other purposes; ask your health care provider or pharmacist if you have questions. COMMON BRAND NAME(S): Prolia, XGEVA What should I tell my health care provider before I take this medicine? They need to know if you have any of these conditions: -dental disease -eczema -infection or history of infections -kidney disease or on dialysis -low blood calcium or vitamin D -malabsorption syndrome -scheduled to have surgery or tooth extraction -taking medicine that contains denosumab -thyroid or parathyroid disease -an unusual reaction to denosumab, other medicines, foods, dyes, or preservatives -pregnant or trying to get pregnant -breast-feeding How should I use this medicine? This medicine is for injection under the skin. It is given by a health care professional in a hospital or clinic setting. If you are getting Prolia, a special MedGuide will be given to you by the pharmacist with each prescription and refill. Be sure to read this information carefully each time. For Prolia, talk to your pediatrician regarding the use of this medicine in children. Special care may be needed. For Xgeva, talk to your pediatrician regarding the use of this medicine in children. While this drug may be prescribed for children as young as 13 years for selected conditions, precautions do apply. Overdosage: If you think you've taken too much of this medicine contact a poison control center or emergency room at once. Overdosage: If you think you have taken too much of this medicine contact a poison control center or emergency room at once. NOTE: This medicine is only for  you. Do not share this medicine with others. What if I miss a dose? It is important not to miss your dose. Call your doctor or health care professional if you are unable to keep an appointment. What may interact with this medicine? Do not take this medicine with any of the following medications: -other medicines containing denosumab This medicine may also interact with the following medications: -medicines that suppress the immune system -medicines that treat cancer -steroid medicines like prednisone or cortisone This list may not describe all possible interactions. Give your health care provider a list of all the medicines, herbs, non-prescription drugs, or dietary supplements you use. Also tell them if you smoke, drink alcohol, or use illegal drugs. Some items may interact with your medicine. What should I watch for while using this medicine? Visit your doctor or health care professional for regular checks on your progress. Your doctor or health care professional may order blood tests and other tests to see how you are doing. Call your doctor or health care professional if you get a cold or other infection while receiving this medicine. Do not treat yourself. This medicine may decrease your body's ability to fight infection. You should make sure you get enough calcium and vitamin D while you are taking this medicine, unless your doctor tells you not to. Discuss the foods you eat and the vitamins you take with your health care professional. See your dentist regularly. Brush and floss your teeth as directed. Before you have any dental work done, tell your dentist you are receiving this medicine. Do not become pregnant while taking this medicine or for 5 months after stopping   it. Women should inform their doctor if they wish to become pregnant or think they might be pregnant. There is a potential for serious side effects to an unborn child. Talk to your health care professional or pharmacist for more  information. What side effects may I notice from receiving this medicine? Side effects that you should report to your doctor or health care professional as soon as possible: -allergic reactions like skin rash, itching or hives, swelling of the face, lips, or tongue -breathing problems -chest pain -fast, irregular heartbeat -feeling faint or lightheaded, falls -fever, chills, or any other sign of infection -muscle spasms, tightening, or twitches -numbness or tingling -skin blisters or bumps, or is dry, peels, or red -slow healing or unexplained pain in the mouth or jaw -unusual bleeding or bruising Side effects that usually do not require medical attention (Report these to your doctor or health care professional if they continue or are bothersome.): -muscle pain -stomach upset, gas This list may not describe all possible side effects. Call your doctor for medical advice about side effects. You may report side effects to FDA at 1-800-FDA-1088. Where should I keep my medicine? This medicine is only given in a clinic, doctor's office, or other health care setting and will not be stored at home. NOTE: This sheet is a summary. It may not cover all possible information. If you have questions about this medicine, talk to your doctor, pharmacist, or health care provider.  2015, Elsevier/Gold Standard. (2011-10-07 12:37:47) Goserelin injection What is this medicine? GOSERELIN (GOE se rel in) is similar to a hormone found in the body. It lowers the amount of sex hormones that the body makes. Men will have lower testosterone levels and women will have lower estrogen levels while taking this medicine. In men, this medicine is used to treat prostate cancer; the injection is either given once per month or once every 12 weeks. A once per month injection (only) is used to treat women with endometriosis, dysfunctional uterine bleeding, or advanced breast cancer. This medicine may be used for other purposes;  ask your health care provider or pharmacist if you have questions. COMMON BRAND NAME(S): Zoladex What should I tell my health care provider before I take this medicine? They need to know if you have any of these conditions (some only apply to women): -diabetes -heart disease or previous heart attack -high blood pressure -high cholesterol -kidney disease -osteoporosis or low bone density -problems passing urine -spinal cord injury -stroke -tobacco smoker -an unusual or allergic reaction to goserelin, hormone therapy, other medicines, foods, dyes, or preservatives -pregnant or trying to get pregnant -breast-feeding How should I use this medicine? This medicine is for injection under the skin. It is given by a health care professional in a hospital or clinic setting. Men receive this injection once every 4 weeks or once every 12 weeks. Women will only receive the once every 4 weeks injection. Talk to your pediatrician regarding the use of this medicine in children. Special care may be needed. Overdosage: If you think you have taken too much of this medicine contact a poison control center or emergency room at once. NOTE: This medicine is only for you. Do not share this medicine with others. What if I miss a dose? It is important not to miss your dose. Call your doctor or health care professional if you are unable to keep an appointment. What may interact with this medicine? -female hormones like estrogen -herbal or dietary supplements like black cohosh, chasteberry,   or DHEA -female hormones like testosterone -prasterone This list may not describe all possible interactions. Give your health care provider a list of all the medicines, herbs, non-prescription drugs, or dietary supplements you use. Also tell them if you smoke, drink alcohol, or use illegal drugs. Some items may interact with your medicine. What should I watch for while using this medicine? Visit your doctor or health care  professional for regular checks on your progress. Your symptoms may appear to get worse during the first weeks of this therapy. Tell your doctor or healthcare professional if your symptoms do not start to get better or if they get worse after this time. Your bones may get weaker if you take this medicine for a long time. If you smoke or frequently drink alcohol you may increase your risk of bone loss. A family history of osteoporosis, chronic use of drugs for seizures (convulsions), or corticosteroids can also increase your risk of bone loss. Talk to your doctor about how to keep your bones strong. This medicine should stop regular monthly menstration in women. Tell your doctor if you continue to menstrate. Women should not become pregnant while taking this medicine or for 12 weeks after stopping this medicine. Women should inform their doctor if they wish to become pregnant or think they might be pregnant. There is a potential for serious side effects to an unborn child. Talk to your health care professional or pharmacist for more information. Do not breast-feed an infant while taking this medicine. Men should inform their doctors if they wish to father a child. This medicine may lower sperm counts. Talk to your health care professional or pharmacist for more information. What side effects may I notice from receiving this medicine? Side effects that you should report to your doctor or health care professional as soon as possible: -allergic reactions like skin rash, itching or hives, swelling of the face, lips, or tongue -bone pain -breathing problems -changes in vision -chest pain -feeling faint or lightheaded, falls -fever, chills -pain, swelling, warmth in the leg -pain, tingling, numbness in the hands or feet -signs and symptoms of low blood pressure like dizziness; feeling faint or lightheaded, falls; unusually weak or tired -stomach pain -swelling of the ankles, feet, hands -trouble passing  urine or change in the amount of urine -unusually high or low blood pressure -unusually weak or tired Side effects that usually do not require medical attention (report to your doctor or health care professional if they continue or are bothersome): -change in sex drive or performance -changes in breast size in both males and females -changes in emotions or moods -headache -hot flashes -irritation at site where injected -loss of appetite -skin problems like acne, dry skin -vaginal dryness This list may not describe all possible side effects. Call your doctor for medical advice about side effects. You may report side effects to FDA at 1-800-FDA-1088. Where should I keep my medicine? This drug is given in a hospital or clinic and will not be stored at home. NOTE: This sheet is a summary. It may not cover all possible information. If you have questions about this medicine, talk to your doctor, pharmacist, or health care provider.  2015, Elsevier/Gold Standard. (2013-06-15 11:10:35)  

## 2016-08-29 NOTE — Progress Notes (Signed)
Erin [name]  MR#: [id number]    PCP: [name], MD GYN: Thomes Cake SU: Rolm Bookbinder OTHER MD: Crista Luria, [address], JN Mann, Arloa Koh, Bonner Puna, Saima Athar, Augustina Mood DDS CHIEF COMPLAINT: Recurrent breast cancer  CURRENT THERAPY: anastrozole; denosumab/Prolia  INTERVAL HISTORY: Erin [name] returns today for follow-up of her recurrent estrogen receptor positive breast cancer. She continues on anastrozole, generally with good tolerance.  Hot flashes and vaginal dryness are not a major issue. She never developed the arthralgias or myalgias that many patients can experience on this medication. She obtains it at a good price.  She also receives denosumab/Prolia every 6 months, with a dose due today. She tolerates that with no side effects that she is aware of.   REVIEW OF SYSTEMS: Erin [name] had several concerns today, none of which thankfully appear to me to be related to her cancer. The lesion on the right bridge of her nose turned out not to be cancer. She developed an eczema like rash across her forehead. She treated it with some lotions and for the time being it is controlled. She developed some mouth sores. She had significant problems with temporomandibular joint issues, and was given a mouth guard, which apparently she was at least partly allergic to. Now that she is using a mouth guard made from a different material she seems to be tolerating it better and that problem has improved. She does have a history of mouth ulcers however. Then she has been having some problems with her left wrist and wondered if she could use a splint clear. She has been having more back pain, and she has not been traveling as much as before. She wonders if we needed to take a look in their to see if everything remains stable, as she puts it. She continues to exercise chiefly by walking and doing the elliptical. Sometimes she goes to the gym and does some weights. Finally she feels  her thyroid is not being properly managed. She says when she was on a higher dose (55 g daily) she just felt better all over. She now is on the lower dose and feels more sluggish and "just not right". Aside from these issues a detailed review of systems today was stable  BREAST CANCER HISTORY From the original intake note 04/07/2011:  Erin [name] had routine screening mammography December of 2009 at the [facility] in [place], [place], showing an increasing asymmetry in her left breast. She was recalled for additional views December 29. She was noted to have heterogeneously dense breasts. It was a 5 cm area of architectural distortion in the lateral aspect of the left breast with no associated calcifications. There was a second lesion medial to this. Ultrasound showed a highly suspicious hypoechoic irregularly marginated mass measuring 3 cm at the 2:30 position 5 cm from the nipple. This was palpable to the mammographer. The second area in question I measured 7 mm and a third lesion was noted measuring 5 mm. Some left axillary lymph nodes were morphologically normal.  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 Bilateral breast MRIs were performed 05/04/2008 and showed, in the left breast, a large lobulated mass measuring up to 4.3 cm, and including both of the apparently separate masses the previously biopsied. In the right breast there were 2 indeterminate lesions. These were evaluated further with breast specific  gamma imaging performed January 14 in both lesions were negative. The lesion in the left breast was markedly abnormal.  Given this complex history and with the background of significant breast density, the patient opted for bilateral mastectomies with left sentinel lymph node sampling. This was performed of 06/27/2008 and showed([id number].  [id number]) in the right breast, no malignancy. In the left breast there was a 3.2 cm invasive ductal carcinoma, grade 1, focally extending to the anterior margin of the lower outer quadrant. There was extensive angiolymphatic invasion, but both sentinel lymph nodes on the left were negative.  The patient received left-sided postmastectomy radiation including of the left chest wall and left supraclavicular fossa to a total dose of 50.4 Gy plus a 10 Gy scar boost. She had an Oncotype DX recurrence score of 17, further discussed below. She decided to forego reconstruction. She was tested for BRCA1 and 2 and was found to be negative. Given her overall prognosis, she did not receive chemotherapy, but started tamoxifen February of 2010, with good tolerance.  RECURRENT DISEASE: SUMMARY OF INITIAL PRESENTATION  Erin [name] took tamoxifen for 5 years with no evidence of active disease area did in May 2015 we decided to stop the tamoxifen because of problems with endometrial hyperplasia. 6 months later, at the November visit, she was found to have a change in a small area of scaring her left inframammary fold. She was referred to dermatology and biopsy 04/21/2014 showed ([id number]) recurrent ductal adenocarcinoma (positive for gross cystic disease fluid protein, estrogen, and progesterone). She then underwent a staging PET scan in [facility] 05/06/2014. This showed a 1.9 cm sclerotic lesion in the right ischial tuberosity, with a maximum standard uptake value of 4.6. A sclerotic lesion in the left side of the L3 vertebral body measuring 1.6 cm had no hypermetabolic activity.  Her subsequent history is as detailed below.   Past Medical History:  Diagnosis Date  . Allergic rhinitis due to pollen   . Breast cancer ([place]) 2010  . Breast cancer ([place]) 2016   reoccurrence in scar  . Hypertension   . Hypothyroidism   . Osteopenia   . S/P radiation therapy 07/12/2014 through  08/16/2014    Left chest wall 4000 cGy in 20 sessions, left chest wall boost 1000 cGy in 5 sessions  . Wears glasses   Diverticulosis       Past Surgical History:  Procedure Laterality Date  . BREAST LUMPECTOMY Left 06/01/2014   Procedure: WIDE LOCAL EXCISION OF RECURRENT LEFT BREAST CANCER;  Surgeon: Rolm Bookbinder, MD;  Location: [facility];  Service: General;  Laterality: Left;  . COLONOSCOPY    . cyst removed     left lower jaw - at age 41 yr  . DILATATION & CURRETTAGE/HYSTEROSCOPY WITH RESECTOCOPE N/A 09/16/2013   Procedure: DILATATION & CURETTAGE/HYSTEROSCOPY WITH RESECTOCOPE;  Surgeon: Princess Bruins, MD;  Location: [facility];  Service: Gynecology;  Laterality: N/A;  1 hr.  . INGUINAL HERNIA REPAIR     right side as infant  . LAPAROSCOPIC PELVIC LYMPH NODE BIOPSY    . MASTECTOMY  2010   bilateral mastectomies-lt snbx-radiation post  . MASTECTOMY Bilateral   . OOPHORECTOMY  2016   patient denies  . Right knee arthroscopy  2008  . ROBOTIC ASSISTED BILATERAL SALPINGO OOPHERECTOMY Bilateral 03/13/2015   Procedure: ROBOTIC ASSISTED BILATERAL SALPINGO OOPHORECTOMY With Washings;  Surgeon: Princess Bruins, MD;  Location: [facility];  Service: Gynecology;  Laterality: Bilateral;  . TONSILLECTOMY    . WISDOM  TOOTH EXTRACTION      FAMILY HISTORY The patient's mother died in 3244 from complications of lung cancer. The patient has not been in touch with her father her for approximately 30 years. She had no sisters, 1 brother, who is in good health. There is no breast or ovarian cancer in the family to her knowledge.  GYNECOLOGIC HISTORY:  GX P0, menarche at around age 4, the patient continued to have periods despite being on tamoxifen, although more irregularly. 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 Her work involves quite a bit of travel.      ADVANCED DIRECTIVES: not in place  HEALTH MAINTENANCE:      Social History  Substance Use Topics  . Smoking status: Never Smoker  . Smokeless tobacco: Never Used  . Alcohol use 0.0 oz/week     Comment: rare      Colonoscopy: December 2013 Collene Mares)  PAP: Feb 2011  Bone density: 06/2009, normal  Cholesterol: "good"      Allergies  Allergen Reactions  . Pollen Extract   . Adhesive [Tape] Rash    MEDICATIONS:    Current Outpatient Prescriptions  Medication Sig Dispense Refill  . acetaminophen (TYLENOL) 500 MG tablet Take 500 mg by mouth every 6 (six) hours as needed for moderate pain or headache.    Marland Kitchen acyclovir ointment (ZOVIRAX) 5 % Apply 1 application topically every 3 (three) hours. As needed 5 g 5  . anastrozole (ARIMIDEX) 1 MG tablet Take 1 tablet (1 mg total) by mouth daily. 90 tablet 4  . Cholecalciferol (VITAMIN D3) 2000 UNITS capsule Take 2,000 Units by mouth daily.      Marland Kitchen ibuprofen (ADVIL,MOTRIN) 200 MG tablet Take 200 mg by mouth every 6 (six) hours as needed for moderate pain.    Marland Kitchen levothyroxine (SYNTHROID, LEVOTHROID) 125 MCG tablet Take 0.5 tablets (62.5 mcg total) by mouth daily. 45 tablet 1  . loratadine (CLARITIN) 10 MG tablet Take 10 mg by mouth daily as needed for allergies.     Marland Kitchen losartan (COZAAR) 100 MG tablet TAKE 1 TABLET(100 MG) BY MOUTH DAILY 90 tablet 0  . metroNIDAZOLE (METROGEL) 1 % gel Apply topically daily. 45 g 0  . Polyethyl Glycol-Propyl Glycol (SYSTANE OP) Apply 1 drop to eye as needed (for allergies).    . sodium chloride (OCEAN) 0.65 % SOLN nasal spray Place 1 spray into both nostrils as needed for congestion.    . valACYclovir (VALTREX) 500 MG tablet Take 1 tablet (500 mg total) by mouth daily. 30 tablet 4   No current facility-administered medications for this visit.     OBJECTIVE:  Middle-aged white woman In no acute distress    Vitals:   08/29/16 1540  BP: 133/74  Pulse: 80  Resp: 18  Temp: 97.9 F (36.6 C)     Body mass index  is 27.09 kg/m.     ECOG performance status:1  Sclerae unicteric, EOMs intact Oropharynx clear and moist No cervical or supraclavicular adenopathy Lungs no rales or rhonchi Heart regular rate and rhythm Abd soft, nontender, positive bowel sounds MSK no focal spinal tenderness, no upper extremity lymphedema Neuro: nonfocal, well oriented, appropriate affect Breasts: She has undergone bilateral mastectomies. There is no evidence of chest wall recurrence. Both axillae are benign.  LABS:           Chemistry      Component Value Date/Time   NA 138 08/29/2016  1515   K 4.6 08/29/2016 1515   CL 102 04/08/2016 0933   CL 105 08/10/2012 0944   CO2 27 08/29/2016 1515   BUN 29.3 (H) 08/29/2016 1515   CREATININE 1.2 (H) 08/29/2016 1515      Component Value Date/Time   CALCIUM 9.6 08/29/2016 1515   ALKPHOS 69 08/29/2016 1515   AST 18 08/29/2016 1515   ALT 14 08/29/2016 1515   BILITOT 1.30 (H) 08/29/2016 1515         Lab Results  Component Value Date   WBC 6.9 08/29/2016   HGB 14.7 08/29/2016   HCT 43.6 08/29/2016   MCV 86.9 08/29/2016   PLT 234 08/29/2016   NEUTROABS 4.8 08/29/2016    STUDIES  Last pelvic MRI was obtained 04/13/2016 and showed the lesion in the right is she'll tuberosity to be stable  ASSESSMENT: 56 y.o.  BRCA negative [place] woman ,  (1) status post bilateral mastectomies January 2010 for a left-sided upper outer quadrant T2 N0 (stage IIA) invasive ductal carcinoma, grade 1, strongly estrogen and progesterone receptor positive, HER-2 nonamplified,   (2) Oncotype DX recurrence score of 17, predicting a 10-12% distant recurrence risk over 10 years if her only adjuvant treatment is tamoxifen for 5 years  (3) status post Left postmastectomy radiation including Left supraclavicular fossa (5040 cGy in 28 sessions)  (4) on tamoxifen starting February of 2010, stopped May 2015 because of endometrial hyperplasia  (5) osteopenia-- T score -1.2 on repeated  bone density scans 09/24/2011 and 12/12/2013  RECURRENT DISEASE (6) skin biopsy of a lesion under the left breast scar 04/21/2014 shows adenocarcinoma, gross cystic disease fluid protein, estrogen and progesterone receptor positive  (a) s/p wide excision with negative though close margins 06/01/2014, prognostic panel pending  (b) radiation to Left chest wall 4000 cGy in 20 sessions, left schest wall boost 1000 cGy in 5 session 07/12/2014 through 08/16/2014  (7) PET scan and bone scan show only one additional area of concern, in the right ischiial tuberosity--  (a) biopsy 05/24/2014 showed no evidence of malignancy  (b) zolendronate started 06/06/2014, poorly tolerated  (c) denosumab/ Xgeva started 09/26/2014, switched to Prolia as of May 2017  (d) repeat bone scan 12/20/2014 shows no new areas of disease, and the previously biopsied area to be delivered  (e) restaging studies 07/07/2015 do not support a diagnosis of metastatic disease.  (f) MRI of the pelvis 04/13/2016 shows the 2.2 lesion in the right is she'll tuberosity to be unchanged  (8) goserelin monthly started 05/09/2016, last dose 02/20/2015  (a) s/p BSO 03/13/2015 with benign pathology  (9) anastrozole started  08/29/2014  (a) bone density 12/13/2015 shows improvement, with the lumbar spine T score now at 0.1, the femoral neck -1.1, both increased   PLAN: Coralyn Mark is now a little over 8 years out from initial surgery for her breast cancer, and 2-1/2 years out from pathologically documented local recurrence, with no evidence of disease activity. This is very favorable.  We are continuing the denosumab/Prolia today and 6 months from now. She will have a repeat bone density scan August 2019 and depending on those results we may or may not continue the denosumab be on the dose a year from today.  We discussed her various issues, which I don't believe are related to her breast cancer diagnosis. 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 The mouth sores probably would be suppressed with low dose of valacyclovir, which  she would prefer not to take additional medications unless necessary and currently since she changed the mouth guard she does not have those issues. Accordingly she may not wish to start that medication at present. I told rates fine to use a splint on her left wrist and I suggested she use it at night which is the time we've most flex and extend or wrists.  As far as the lower back issues, we are going to repeat a lumbar MRI November of this year.  She will see me again in December. She knows to call for any problems that may develop before that visit.  Chauncey Cruel, MD  08/29/2016

## 2016-08-30 LAB — VITAMIN D 25 HYDROXY (VIT D DEFICIENCY, FRACTURES): VIT D 25 HYDROXY: 36.2 ng/mL (ref 30.0–100.0)

## 2016-10-29 ENCOUNTER — Encounter: Payer: Self-pay | Admitting: Nurse Practitioner

## 2016-10-29 ENCOUNTER — Ambulatory Visit (INDEPENDENT_AMBULATORY_CARE_PROVIDER_SITE_OTHER): Payer: BC Managed Care – PPO | Admitting: Nurse Practitioner

## 2016-10-29 ENCOUNTER — Telehealth: Payer: Self-pay | Admitting: Internal Medicine

## 2016-10-29 ENCOUNTER — Other Ambulatory Visit (INDEPENDENT_AMBULATORY_CARE_PROVIDER_SITE_OTHER): Payer: BC Managed Care – PPO

## 2016-10-29 VITALS — BP 136/76 | HR 76 | Temp 97.8°F | Ht 65.0 in | Wt 162.0 lb

## 2016-10-29 DIAGNOSIS — E038 Other specified hypothyroidism: Secondary | ICD-10-CM | POA: Diagnosis not present

## 2016-10-29 DIAGNOSIS — Z23 Encounter for immunization: Secondary | ICD-10-CM

## 2016-10-29 DIAGNOSIS — M778 Other enthesopathies, not elsewhere classified: Secondary | ICD-10-CM

## 2016-10-29 LAB — TSH: TSH: 3.02 u[IU]/mL (ref 0.35–4.50)

## 2016-10-29 LAB — T4, FREE: FREE T4: 0.98 ng/dL (ref 0.60–1.60)

## 2016-10-29 MED ORDER — NAPROXEN-ESOMEPRAZOLE 500-20 MG PO TBEC: 1.0000 | DELAYED_RELEASE_TABLET | Freq: Two times a day (BID) | ORAL | 1 refills | Status: DC | PRN

## 2016-10-29 MED ORDER — DICLOFENAC SODIUM 2 % TD SOLN: 1.0000 "application " | Freq: Two times a day (BID) | TRANSDERMAL | 1 refills | Status: DC | PRN

## 2016-10-29 MED ORDER — LEVOTHYROXINE SODIUM 75 MCG PO TABS: 75.0000 ug | ORAL_TABLET | Freq: Every day | ORAL | 0 refills | Status: DC

## 2016-10-29 NOTE — Patient Instructions (Addendum)
Go to basement for blood draw. Will increase thyroid medication to 66mcg if no change in free T4  Wear wrist brace morning at night x1week, then at night only continuously Go wrist exercise once a day. Call office for referral to sports medicine if no improvement in 2weeks.  Wrist and Forearm Exercises Ask your health care provider which exercises are safe for you. Do exercises exactly as told by your health care provider and adjust them as directed. It is normal to feel mild stretching, pulling, tightness, or discomfort as you do these exercises, but you should stop right away if you feel sudden pain or your pain gets worse. Do not begin these exercises until told by your health care provider. RANGE OF MOTION EXERCISES These exercises warm up your muscles and joints and improve the movement and flexibility of your injured wrist and forearm. These exercises also help to relieve pain, numbness, and tingling. These exercises are done using the muscles in your injured wrist and forearm. Exercise A: Wrist Flexion, Active 1. With your fingers relaxed, bend your wrist forward as far as you can. 2. Hold this position for __________ seconds. Repeat __________ times. Complete this exercise __________ times a day. Exercise B: Wrist Extension, Active 1. With your fingers relaxed, bend your wrist backward as far as you can. 2. Hold this position for __________ seconds. Repeat __________ times. Complete this exercise __________ times a day. Exercise C: Supination, Active  1. Stand or sit with your arms at your sides. 2. Bend your left / right elbow to an "L" shape (90 degrees). 3. Turn your palm upward until you feel a gentle stretch on the inside of your forearm. 4. Hold this position for __________ seconds. 5. Slowly return your palm to the starting position. Repeat __________ times. Complete this exercise __________ times a day. Exercise D: Pronation, Active  1. Stand or sit with your arms at your  sides. 2. Bend your left / right elbow to an "L" shape (90 degrees). 3. Turn your palm downward until you feel a gentle stretch on the top of your forearm. 4. Hold this position for __________ seconds. 5. Slowly return your palm to the starting position. Repeat __________ times. Complete this exercise __________ times a day. STRETCHING EXERCISES These exercises warm up your muscles and joints and improve the movement and flexibility of your injured wrist and forearm. These exercises also help to relieve pain, numbness, and tingling. These exercises are done using your healthy wrist and forearm to help stretch the muscles in your injured wrist and forearm. Exercise E: Wrist Flexion, Passive  1. Extend your left / right arm in front of you, relax your wrist, and point your fingers downward. 2. Gently push on the back of your hand. Stop when you feel a gentle stretch on the top of your forearm. 3. Hold this position for __________ seconds. Repeat __________ times. Complete this exercise __________ times a day. Exercise F: Wrist Extension, Passive  1. Extend your left / right arm in front of you and turn your palm upward. 2. Gently pull your palm and fingertips back so your fingers point downward. You should feel a gentle stretch on the palm-side of your forearm. 3. Hold this position for __________ seconds. Repeat __________ times. Complete this exercise __________ times a day. Exercise G: Forearm Rotation, Supination, Passive 1. Sit with your left / right elbow bent to an "L" shape (90 degrees) with your forearm resting on a table. 2. Keeping your upper body  and shoulder still, use your other hand to rotate your forearm palm-up until you feel a gentle to moderate stretch. 3. Hold this position for __________ seconds. 4. Slowly release the stretch and return to the starting position. Repeat __________ times. Complete this exercise __________ times a day. Exercise H: Forearm Rotation,  Pronation, Passive 1. Sit with your left / right elbow bent to an "L" shape (90 degrees) with your forearm resting on a table. 2. Keeping your upper body and shoulder still, use your other hand to rotate your forearm palm-down until you feel a gentle to moderate stretch. 3. Hold this position for __________ seconds. 4. Slowly release the stretch and return to the starting position. Repeat __________ times. Complete this exercise __________ times a day. STRENGTHENING EXERCISES These exercises build strength and endurance in your wrist and forearm. Endurance is the ability to use your muscles for a long time, even after they get tired. Exercise I: Wrist Flexors  1. Sit with your left / right forearm supported on a table and your hand resting palm-up over the edge of the table. Your elbow should be bent to an "L" shape (about 90 degrees) and be below the level of your shoulder. 2. Hold a __________ weight in your left / right hand. Or, hold a rubber exercise band or tube in both hands, keeping your hands at the same level and hip distance apart. There should be a slight tension in the exercise band or tube. 3. Slowly curl your hand up toward your forearm. 4. Hold this position for __________ seconds. 5. Slowly lower your hand back to the starting position. Repeat __________ times. Complete this exercise __________ times a day. Exercise J: Wrist Extensors  1. Sit with your left / right forearm supported on a table and your hand resting palm-down over the edge of the table. Your elbow should be bent to an "L" shape (about 90 degrees) and be below the level of your shoulder. 2. Hold a __________ weight in your left / right hand. Or, hold a rubber exercise band or tube in both hands, keeping your hands at the same level and hip distance apart. There should be a slight tension in the exercise band or tube. 3. Slowly curl your hand up toward your forearm. 4. Hold this position for __________  seconds. 5. Slowly lower your hand back to the starting position. Repeat __________ times. Complete this exercise __________ times a day. Exercise K: Forearm Rotation, Supination  1. Sit with your left / right forearm supported on a table and your hand resting palm-down. Your elbow should be at your side, bent to an "L" shape (about 90 degrees), and below the level of your shoulder. Keep your wrist stable and in a neutral position throughout the exercise. 2. Gently hold a lightweight hammer with your left / right hand. 3. Without moving your elbow or wrist, slowly rotate your palm upward to a thumbs-up position. 4. Hold this position for __________ seconds. 5. Slowly return your forearm to the starting position. Repeat __________ times. Complete this exercise __________ times a day. Exercise L: Forearm Rotation, Pronation  1. Sit with your left / right forearm supported on a table and your hand resting palm-up. Your elbow should be at your side, bent to an "L" shape (about 90 degrees), and below the level of your shoulder. Keep your wrist stable. Do not allow it to move backward or forward during the exercise. 2. Gently hold a lightweight hammer with your left /  right hand. 3. Without moving your elbow or wrist, slowly rotate your palm and hand upward to a thumbs-up position. 4. Hold this position for __________ seconds. 5. Slowly return your forearm to the starting position. Repeat __________ times. Complete this exercise __________ times a day. Exercise M: Grip Strengthening  1. Hold one of these items in your left / right hand: play dough, therapy putty, a dense sponge, a stress ball, or a large, rolled sock. 2. Squeeze as hard as you can without increasing pain. 3. Hold this position for __________ seconds. 4. Slowly release your grip. Repeat __________ times. Complete this exercise __________ times a day. This information is not intended to replace advice given to you by your health  care provider. Make sure you discuss any questions you have with your health care provider. Document Released: 02/20/2005 Document Revised: 01/01/2016 Document Reviewed: 01/01/2015 Elsevier Interactive Patient Education  [employer].

## 2016-10-29 NOTE — Progress Notes (Signed)
Subjective:  Patient ID: Erin [name], female    DOB: [birth year]  Age: 56 y.o. MRN: [id number]  CC: Wrist Pain (left wrist pain at times--going on for while/ tdap?/ thyroid consult?)   HPI  Left wrist pain: Intermittent since 07/2016 Hx of bilateral mastectomy with lymph node removed on left axilla. Use of wrist brace with some improvement. Radiates to shoulder and neck. No injury Hx of osteopenia. Use of prolia at this time. Left hand dorminant. No paresthesia, no weakness, no swelling.  Hypothyroidism: Need refill. Will like to consider adjusting medication dose to 22mcg due to persistent symptoms of Fatigue, mental fog (difficulty with wording finding). Dose was decreased by pcp from 24mcg to 86mcg to 62.76mcg.  Outpatient Medications Prior to Visit  Medication Sig Dispense Refill  . acetaminophen (TYLENOL) 500 MG tablet Take 500 mg by mouth every 6 (six) hours as needed for moderate pain or headache.    Marland Kitchen acyclovir ointment (ZOVIRAX) 5 % Apply 1 application topically every 3 (three) hours. As needed 5 g 5  . anastrozole (ARIMIDEX) 1 MG tablet Take 1 tablet (1 mg total) by mouth daily. 90 tablet 4  . Cholecalciferol (VITAMIN D3) 2000 UNITS capsule Take 2,000 Units by mouth daily.      Marland Kitchen ibuprofen (ADVIL,MOTRIN) 200 MG tablet Take 200 mg by mouth every 6 (six) hours as needed for moderate pain.    Marland Kitchen loratadine (CLARITIN) 10 MG tablet Take 10 mg by mouth daily as needed for allergies.     Marland Kitchen losartan (COZAAR) 100 MG tablet TAKE 1 TABLET(100 MG) BY MOUTH DAILY 90 tablet 0  . Polyethyl Glycol-Propyl Glycol (SYSTANE OP) Apply 1 drop to eye as needed (for allergies).    . sodium chloride (OCEAN) 0.65 % SOLN nasal spray Place 1 spray into both nostrils as needed for congestion.    Marland Kitchen levothyroxine (SYNTHROID, LEVOTHROID) 125 MCG tablet Take 0.5 tablets (62.5 mcg total) by mouth daily. 45 tablet 1  . metroNIDAZOLE (METROGEL) 1 % gel Apply topically daily. 45 g 0  . valACYclovir  (VALTREX) 500 MG tablet Take 1 tablet (500 mg total) by mouth daily. 30 tablet 4   No facility-administered medications prior to visit.     ROS See HPI  Objective:  BP 136/76   Pulse 76   Temp 97.8 F (36.6 C)   Ht 5\' 5"  (1.651 m)   Wt 162 lb (73.5 kg)   SpO2 97%   BMI 26.96 kg/m   BP Readings from Last 3 Encounters:  10/29/16 136/76  08/29/16 133/74  05/06/16 140/74    Wt Readings from Last 3 Encounters:  10/29/16 162 lb (73.5 kg)  08/29/16 162 lb 12.8 oz (73.8 kg)  05/06/16 162 lb 1.6 oz (73.5 kg)    Physical Exam  Constitutional: She is oriented to person, place, and time. No distress.  HENT:  Right Ear: External ear normal.  Left Ear: External ear normal.  Nose: Nose normal.  Mouth/Throat: No oropharyngeal exudate.  Eyes: No scleral icterus.  Neck: Normal range of motion. Neck supple. No thyromegaly present.  Cardiovascular: Normal rate, regular rhythm and normal heart sounds.   Pulmonary/Chest: Effort normal and breath sounds normal. No respiratory distress.  Abdominal: Soft. She exhibits no distension.  Musculoskeletal: Normal range of motion. She exhibits tenderness. She exhibits no edema or deformity.  Positive left phalen test. No tenderness over lateral aspect of wrist.  Lymphadenopathy:    She has no cervical adenopathy.  Neurological: She is alert and oriented  to person, place, and time.  Skin: Skin is warm and dry.  Psychiatric: She has a normal mood and affect. Her behavior is normal.  Vitals reviewed.   Lab Results  Component Value Date   WBC 6.9 08/29/2016   HGB 14.7 08/29/2016   HCT 43.6 08/29/2016   PLT 234 08/29/2016   GLUCOSE 103 08/29/2016   CHOL 152 04/08/2016   TRIG 80.0 04/08/2016   HDL 58.30 04/08/2016   LDLCALC 78 04/08/2016   ALT 14 08/29/2016   AST 18 08/29/2016   NA 138 08/29/2016   K 4.6 08/29/2016   CL 102 04/08/2016   CREATININE 1.2 (H) 08/29/2016   BUN 29.3 (H) 08/29/2016   CO2 27 08/29/2016   TSH 3.02  10/29/2016   INR 0.96 05/24/2014   HGBA1C 5.5 04/07/2015    Mr Pelvis W Wo Contrast  Result Date: 04/13/2016 CLINICAL DATA:  Patient's history of breast cancer diagnosed in 2009. Sclerotic lesion in the right ischial tuberosity was biopsied and 2016. Biopsy was negative. EXAM: MRI PELVIS WITHOUT AND WITH CONTRAST TECHNIQUE: Multiplanar multisequence MR imaging of the pelvis was performed both before and after administration of intravenous contrast. CONTRAST:  15 ml MULTIHANCE GADOBENATE DIMEGLUMINE 529 MG/ML IV SOLN COMPARISON:  CT abdomen and pelvis 04/19/2015. MRI pelvis 05/20/2014. Whole-body bone scan 12/20/2014. FINDINGS: The T1 hypointense lesion in the right ischial tuberosity seen on the prior MRI is unchanged in size at 2.2 x 1.6 cm on image 22 of series 3. The lesion is well circumscribed and demonstrates new mild T2 hyperintensity. It does not enhance. Bone marrow signal is otherwise normal. Hazy sclerotic lesion in L3 seen on prior examinations is not visualized on this study. No new or enlarging lesion is identified. All imaged musculature is intact and normal in appearance. No fluid collection or mass is identified. Imaged intrapelvic contents are unremarkable. IMPRESSION: Lesion in the right ischial tuberosity seen on prior examinations is unchanged in size and no new lesions are identified. The examination is otherwise negative. Electronically Signed   By: Inge Rise M.D.   On: 04/13/2016 19:40    Assessment & Plan:   [place] was seen today for wrist pain.  Diagnoses and all orders for this visit:  Other specified hypothyroidism -     T4, free; Future -     TSH; Future -     levothyroxine (SYNTHROID, LEVOTHROID) 75 MCG tablet; Take 1 tablet (75 mcg total) by mouth daily before breakfast.  Left wrist tendinitis -     Diclofenac Sodium (PENNSAID) 2 % SOLN; Place 1 application onto the skin 2 (two) times daily as needed. -     Naproxen-Esomeprazole (VIMOVO) 500-20 MG TBEC;  Take 1 tablet by mouth 2 (two) times daily between meals as needed.  Need for diphtheria-tetanus-pertussis (Tdap) vaccine -     Tdap vaccine greater than or equal to 7yo IM   I have changed Ms. Basques metroNIDAZOLE, valACYclovir, and levothyroxine. I am also having her start on Diclofenac Sodium and Naproxen-Esomeprazole. Additionally, I am having her maintain her Vitamin D3, loratadine, Polyethyl Glycol-Propyl Glycol (SYSTANE OP), sodium chloride, ibuprofen, acetaminophen, acyclovir ointment, losartan, and anastrozole.  Meds ordered this encounter  Medications  . metroNIDAZOLE (METROGEL) 1 % gel    Sig: Apply topically daily as needed.    Dispense:  45 g    Refill:  0    Order Specific Question:   Supervising Provider    Answer:   Cassandria Anger [[id number]]  . valACYclovir (  VALTREX) 500 MG tablet    Sig: Take 1 tablet (500 mg total) by mouth daily as needed.    Dispense:  30 tablet    Refill:  4    Order Specific Question:   Supervising Provider    Answer:   Cassandria Anger [[id number]]  . Diclofenac Sodium (PENNSAID) 2 % SOLN    Sig: Place 1 application onto the skin 2 (two) times daily as needed.    Dispense:  2 g    Refill:  1    Order Specific Question:   Supervising Provider    Answer:   Cassandria Anger [[id number]]  . Naproxen-Esomeprazole (VIMOVO) 500-20 MG TBEC    Sig: Take 1 tablet by mouth 2 (two) times daily between meals as needed.    Dispense:  30 tablet    Refill:  1    Order Specific Question:   Supervising Provider    Answer:   Cassandria Anger [[id number]]  . levothyroxine (SYNTHROID, LEVOTHROID) 75 MCG tablet    Sig: Take 1 tablet (75 mcg total) by mouth daily before breakfast.    Dispense:  90 tablet    Refill:  0    Order Specific Question:   Supervising Provider    Answer:   Cassandria Anger [[id number]]    Follow-up: Return in about 3 months (around 01/29/2017) for with Dr. Sharlet Salina.  Wilfred Lacy, NP

## 2016-10-29 NOTE — Telephone Encounter (Signed)
Pt is needing to schedule a 3 month follow up with Dr Sharlet Salina per [place].

## 2016-11-01 ENCOUNTER — Other Ambulatory Visit: Payer: Self-pay | Admitting: Oncology

## 2016-11-29 ENCOUNTER — Ambulatory Visit: Payer: BC Managed Care – PPO | Admitting: Family Medicine

## 2016-12-02 ENCOUNTER — Ambulatory Visit (INDEPENDENT_AMBULATORY_CARE_PROVIDER_SITE_OTHER): Payer: BC Managed Care – PPO | Admitting: Family Medicine

## 2016-12-02 ENCOUNTER — Encounter: Payer: Self-pay | Admitting: Family Medicine

## 2016-12-02 ENCOUNTER — Ambulatory Visit: Payer: Self-pay

## 2016-12-02 VITALS — BP 118/74 | HR 78 | Temp 98.2°F | Ht 60.0 in | Wt 162.0 lb

## 2016-12-02 DIAGNOSIS — M654 Radial styloid tenosynovitis [de Quervain]: Secondary | ICD-10-CM | POA: Diagnosis not present

## 2016-12-02 DIAGNOSIS — M25532 Pain in left wrist: Secondary | ICD-10-CM

## 2016-12-02 MED ORDER — MELOXICAM 15 MG PO TABS: 15.0000 mg | ORAL_TABLET | Freq: Every day | ORAL | 0 refills | Status: DC | PRN

## 2016-12-02 NOTE — Patient Instructions (Addendum)
Thank you for coming in,   Please try these exercises. Please take the anti-inflammatory for 10 days straight and then as needed after that. If you do not have any improvement then we can consider an injection to this area.   Please feel free to call with any questions or concerns at any time, at [phone]. --Dr. Raeford Razor

## 2016-12-02 NOTE — Progress Notes (Signed)
Erin [name] - 56 y.o. female MRN [id number]  Date of birth: 01-17-1961  SUBJECTIVE:  Including CC & ROS.  Chief Complaint  Patient presents with  . Wrist Pain    patient states right wrist has been bothering her since april. Patient has tried braces and taking tylenol and Pennsaid have not really helped.     Erin [name] is a 56 year old female is presenting with left wrist pain. She has been seen for this couple different times. She has been trying different exercises and wrist braces with no improvement. She has not currently taking any oral medications. She feels like the pain is acute on chronic in nature. Has been present since April. The pain is on the dorsal aspect of her wrist as well as the radial aspect. Denies any injury to her wrist. This pain started out of nowhere with no side effects. She denies any prior history. She is left-handed and is growing on the left wrist. She feels like the brace helps at times but also hurts at other times. She denies any significant numbness in her fingers.     Review of Systems  Cardiovascular: Negative for leg swelling.  Musculoskeletal: Positive for myalgias.  Skin: Negative for rash.  Neurological: Negative for weakness and numbness.   otherwise negative  HISTORY: Past Medical, Surgical, Social, and Family History Reviewed & Updated per EMR.   Pertinent Historical Findings include:  Past Medical History:  Diagnosis Date  . Allergic rhinitis due to pollen   . Breast cancer ([place]) 2010  . Breast cancer ([place]) 2016   reoccurrence in scar  . Hypertension   . Hypothyroidism   . Osteopenia   . S/P radiation therapy 07/12/2014 through 08/16/2014    Left chest wall 4000 cGy in 20 sessions, left chest wall boost 1000 cGy in 5 sessions  . Wears glasses     Past Surgical History:  Procedure Laterality Date  . BREAST LUMPECTOMY Left 06/01/2014   Procedure: WIDE LOCAL EXCISION OF RECURRENT  LEFT BREAST CANCER;  Surgeon: Rolm Bookbinder, MD;  Location: [facility];  Service: General;  Laterality: Left;  . COLONOSCOPY    . cyst removed     left lower jaw - at age 72 yr  . DILATATION & CURRETTAGE/HYSTEROSCOPY WITH RESECTOCOPE N/A 09/16/2013   Procedure: DILATATION & CURETTAGE/HYSTEROSCOPY WITH RESECTOCOPE;  Surgeon: Princess Bruins, MD;  Location: [facility];  Service: Gynecology;  Laterality: N/A;  1 hr.  . INGUINAL HERNIA REPAIR     right side as infant  . LAPAROSCOPIC PELVIC LYMPH NODE BIOPSY    . MASTECTOMY  2010   bilateral mastectomies-lt snbx-radiation post  . MASTECTOMY Bilateral   . OOPHORECTOMY  2016   patient denies  . Right knee arthroscopy  2008  . ROBOTIC ASSISTED BILATERAL SALPINGO OOPHERECTOMY Bilateral 03/13/2015   Procedure: ROBOTIC ASSISTED BILATERAL SALPINGO OOPHORECTOMY With Washings;  Surgeon: Princess Bruins, MD;  Location: [facility];  Service: Gynecology;  Laterality: Bilateral;  . TONSILLECTOMY    . WISDOM TOOTH EXTRACTION      Allergies  Allergen Reactions  . Pollen Extract   . Adhesive [Tape] Rash    Family History  Problem Relation Age of Onset  . Hypertension Mother   . Cancer Mother        Lung Cancer - long history of tobacco use  . Hypertension Father   . Stroke Maternal Grandfather   . Stroke Paternal Grandfather   . Colon cancer Neg Hx   . Stomach cancer Neg  Hx      Social History   Social History  . Marital status: Single    Spouse name: N/A  . Number of children: 0  . Years of education: PhD   Occupational History  . Not on file.   Social History Main Topics  . Smoking status: Never Smoker  . Smokeless tobacco: Never Used  . Alcohol use 0.0 oz/week     Comment: rare  . Drug use: No  . Sexual activity: Yes    Birth control/ protection: None   Other Topics Concern  . Not on file   Social History Narrative   She grew up in [place] last 16 yrs in [place]   Rare caffeine use       PHYSICAL EXAM:  VS: BP 118/74 (BP Location: Right Arm, Patient Position: Sitting, Cuff Size: Normal)   Pulse 78   Temp 98.2 F (36.8 C) (Oral)   Ht 5' (1.524 m)   Wt 162 lb (73.5 kg)   SpO2 99%   BMI 31.64 kg/m  Physical Exam Gen: NAD, alert, cooperative with exam, well-appearing ENT: normal lips, normal nasal mucosa,  Eye: normal EOM, normal conjunctiva and lids CV:  no edema, +2 pedal pulses   Resp: no accessory muscle use, non-labored,  GI: no masses or tenderness, no hernia  Skin: no rashes, no areas of induration  Neuro: normal tone, normal sensation to touch Psych:  normal insight, alert and oriented MSK:  Left wrist: No significant tenderness over the lateral condyle or medial condyle No significant tenderness to palpation of the distal radius  Normal wrist range of motion. Normal strength to resistance. Some tenderness to palpation over the forearm dorsal aspect. Normal strength finger abduction and abduction. Negative Finkelstein's test. No pain with extension of the middle finger to resistance. Neurovascularly intact.  Limited ultrasound: Left wrist:  The first dorsal compartment appears to have a hypoechoic fluid change around the tendon sheath to suggest de Quervain's. There also appears to be a hypoechoic change within the tendon to suggest tendinopathy. Some mild edema in the second dorsal compartment. Normal-appearing third and fourth dorsal compartment. No significant arthritis within the [facility] joint.  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  Ultrasound and interpretation by Clearance Coots, MD      ASSESSMENT & PLAN:   Erin [name] disease (tenosynovitis) Acute on chronic in nature. Ultrasound suggests that it is the first dorsal compartment. Mavik component of the second dorsal arm as well. She is not had any oral medications at this point. Has not tried injections or physical  therapy. - Prescribed mobic but she has Vimovo to take  - Provided home exercises today. - Can continue use the brace if it helps. - Advised her to follow up. If there is no improvement consider injection versus physical therapy versus nitroglycerin patching

## 2016-12-02 NOTE — Assessment & Plan Note (Addendum)
Acute on chronic in nature. Ultrasound suggests that it is the first dorsal compartment. Mavik component of the second dorsal arm as well. She is not had any oral medications at this point. Has not tried injections or physical therapy. - Prescribed mobic but she has Vimovo to take  - Provided home exercises today. - Can continue use the brace if it helps. - Advised her to follow up. If there is no improvement consider injection versus physical therapy versus nitroglycerin patching

## 2016-12-26 ENCOUNTER — Ambulatory Visit: Payer: Self-pay

## 2016-12-26 ENCOUNTER — Ambulatory Visit (INDEPENDENT_AMBULATORY_CARE_PROVIDER_SITE_OTHER): Payer: BC Managed Care – PPO | Admitting: Family Medicine

## 2016-12-26 ENCOUNTER — Encounter: Payer: Self-pay | Admitting: Family Medicine

## 2016-12-26 VITALS — BP 122/82 | HR 101 | Ht 65.0 in | Wt 162.0 lb

## 2016-12-26 DIAGNOSIS — M654 Radial styloid tenosynovitis [de Quervain]: Secondary | ICD-10-CM | POA: Diagnosis not present

## 2016-12-26 DIAGNOSIS — M25532 Pain in left wrist: Secondary | ICD-10-CM | POA: Diagnosis not present

## 2016-12-26 MED ORDER — VITAMIN D (ERGOCALCIFEROL) 1.25 MG (50000 UNIT) PO CAPS
50000.0000 [IU] | ORAL_CAPSULE | ORAL | 0 refills | Status: DC
Start: 2016-12-26 — End: 2017-04-17

## 2016-12-26 NOTE — Assessment & Plan Note (Signed)
Patient given injection and tolerated the procedure well. We discussed icing regimen, home exercises, topical anti-inflammatory's. Patient was put in a thumb spica cast that I think will be more beneficial. Patient will do the topical anti-inflammatories. Once weekly vitamin D for muscle strength and endurance. Follow-up again in 3 weeks

## 2016-12-26 NOTE — Progress Notes (Signed)
[name] [address], [place] [address] Phone: [phone] Subjective:    I'm seeing this patient by the request  of:    CC: Left wrist pain  [id number]  Erin [name] is a 56 y.o. female coming in with complaint of left wrist and hand pain. Patient has been taking vimovo for inflammation for the past 3 weeks. She is noticing improvement over time. She is also having problems with her back. She believes that her hand is connected to the shoulder and back pain that she is experiencing. Patient states that the wrist seems to still give her significant amount and discomfort even at night sometimes. States that certain movements causes severe pain.  Feels the over-the-counter bracing and the topical anti-inflammatories have only been minimally helpful. The home exercises have not been helpful.     Past Medical History:  Diagnosis Date  . Allergic rhinitis due to pollen   . Breast cancer ([place]) 2010  . Breast cancer ([place]) 2016   reoccurrence in scar  . Hypertension   . Hypothyroidism   . Osteopenia   . S/P radiation therapy 07/12/2014 through 08/16/2014    Left chest wall 4000 cGy in 20 sessions, left chest wall boost 1000 cGy in 5 sessions  . Wears glasses    Past Surgical History:  Procedure Laterality Date  . BREAST LUMPECTOMY Left 06/01/2014   Procedure: WIDE LOCAL EXCISION OF RECURRENT LEFT BREAST CANCER;  Surgeon: Rolm Bookbinder, MD;  Location: [facility];  Service: General;  Laterality: Left;  . COLONOSCOPY    . cyst removed     left lower jaw - at age 81 yr  . DILATATION & CURRETTAGE/HYSTEROSCOPY WITH RESECTOCOPE N/A 09/16/2013   Procedure: DILATATION & CURETTAGE/HYSTEROSCOPY WITH RESECTOCOPE;  Surgeon: Princess Bruins, MD;  Location: [facility];  Service: Gynecology;  Laterality: N/A;  1 hr.  . INGUINAL HERNIA REPAIR     right side as infant  . LAPAROSCOPIC  PELVIC LYMPH NODE BIOPSY    . MASTECTOMY  2010   bilateral mastectomies-lt snbx-radiation post  . MASTECTOMY Bilateral   . OOPHORECTOMY  2016   patient denies  . Right knee arthroscopy  2008  . ROBOTIC ASSISTED BILATERAL SALPINGO OOPHERECTOMY Bilateral 03/13/2015   Procedure: ROBOTIC ASSISTED BILATERAL SALPINGO OOPHORECTOMY With Washings;  Surgeon: Princess Bruins, MD;  Location: [facility];  Service: Gynecology;  Laterality: Bilateral;  . TONSILLECTOMY    . WISDOM TOOTH EXTRACTION     Social History   Social History  . Marital status: Single    Spouse name: N/A  . Number of children: 0  . Years of education: PhD   Social History Main Topics  . Smoking status: Never Smoker  . Smokeless tobacco: Never Used  . Alcohol use 0.0 oz/week     Comment: rare  . Drug use: No  . Sexual activity: Yes    Birth control/ protection: None   Other Topics Concern  . Not on file   Social History Narrative   She grew up in [place] last 16 yrs in [place]   Rare caffeine use    Allergies  Allergen Reactions  . Pollen Extract   . Adhesive [Tape] Rash   Family History  Problem Relation Age of Onset  . Hypertension Mother   . Cancer Mother        Lung Cancer - long history of tobacco use  . Hypertension Father   . Stroke Maternal Grandfather   .  Stroke Paternal Grandfather   . Colon cancer Neg Hx   . Stomach cancer Neg Hx      Past medical history, social, surgical and family history all reviewed in electronic medical record.  No pertanent information unless stated regarding to the chief complaint.   Review of Systems:Review of systems updated and as accurate as of 12/26/16  No headache, visual changes, nausea, vomiting, diarrhea, constipation, dizziness, abdominal pain, skin rash, fevers, chills, night sweats, weight loss, swollen lymph nodes, body aches, joint swelling,  chest pain, shortness of breath, mood changes. Positive muscle aches  Objective  There were no vitals  taken for this visit. Systems examined below as of 12/26/16   General: No apparent distress alert and oriented x3 mood and affect normal, dressed appropriately.  HEENT: Pupils equal, extraocular movements intact  Respiratory: Patient's speak in full sentences and does not appear short of breath  Cardiovascular: No lower extremity edema, non tender, no erythema  Skin: Warm dry intact with no signs of infection or rash on extremities or on axial skeleton.  Abdomen: Soft nontender  Neuro: Cranial nerves II through XII are intact, neurovascularly intact in all extremities with 2+ DTRs and 2+ pulses.  Lymph: No lymphadenopathy of posterior or anterior cervical chain or axillae bilaterally.  Gait normal with good balance and coordination.  MSK:  Non tender with full range of motion and good stability and symmetric strength and tone of shoulders, elbows,  hip, knee and ankles bilaterally.  Wrist: Left Mild swelling in the first dorsal compartment ROM smooth and normal with good flexion and extension and ulnar/radial deviation that is symmetrical with opposite wrist. Palpation is normal over metacarpals, navicular, lunate, and TFCC; tendons without tenderness/ swelling No snuffbox tenderness. No tenderness over Canal of Guyon. Strength 5/5 in all directions without pain. Positive Finkelstein, negative tinel's and phalens. Negative Watson's test. Contralateral wrist unremarkable  Procedure: Real-time Ultrasound Guided Injection of left Abductor pollicis longs tendon sheath Device: GE Logiq Q7  Ultrasound guided injection is preferred based studies that show increased duration, increased effect, greater accuracy, decreased procedural pain, increased response rate with ultrasound guided versus blind injection.  Verbal informed consent obtained.  Time-out conducted.  Noted no overlying erythema, induration, or other signs of local infection.  Skin prepped in a sterile fashion.  Local anesthesia:  Topical Ethyl chloride.  With sterile technique and under real time ultrasound guidance:  tendon visualized.  23g 5/8 inch needle inserted distal to proximal approach into tendon sheath. Pictures taken  for needle placement. Patient did have injection of 0.5 cc of of 0.5% Marcaine, and 0.5 cc of Kenalog 40 mg/dL. Completed without difficulty  Pain immediately resolved suggesting accurate placement of the medication.  Advised to call if fevers/chills, erythema, induration, drainage, or persistent bleeding.  Images permanently stored and available for review in the ultrasound unit.  Impression: Technically successful ultrasound guided injection.  Procedure note 27741; 15 additional minutes spent for Therapeutic exercises as stated in above notes.  This included exercises focusing on stretching, strengthening, with significant focus on eccentric aspects.   Long term goals include an improvement in range of motion, strength, endurance as well as avoiding reinjury. Patient's frequency would include in 1-2 times a day, 3-5 times a week for a duration of 6-12 weeks. We reviewed the hand anatomy in [employer] and reviewed the components involved.  Ice bid for 2-3 days at a minimum Place the thumb in a thumb spica splint Hand exercises in a week  or so - stress ball then opening hand with rubber bands  If continues to do poorly, may need to inject the 1st dorsal sheath   Proper technique shown and discussed handout in great detail with ATC.  All questions were discussed and answered.     Impression and Recommendations:     This case required medical decision making of moderate complexity.      Note: This dictation was prepared with Dragon dictation along with smaller phrase technology. Any transcriptional errors that result from this process are unintentional.

## 2016-12-26 NOTE — Patient Instructions (Signed)
Good to see you  Ice 20 minutes 2 times daily. Usually after activity and before bed. pennsaid pinkie amount topically 2 times daily as needed.  Wear brace day and night for 1 week then nightly for 2 weeks.  Once weekly vitamin D for 12 weeks.  Turmeric 500mg  twice daily  Avoid heavy lifting Ice 20 minutes 2 times daily. Usually after activity and before bed. Exercises 3 times a week.  See me again in 3 weeks.

## 2017-01-01 ENCOUNTER — Telehealth: Payer: Self-pay | Admitting: *Deleted

## 2017-01-01 NOTE — Telephone Encounter (Signed)
Received call from pt stating that another MD prescribed tumeric 500 mg bid for her & also Vit D 50,000 units weekly.  Dr [name] had prescribed Vit D 2000 units daily.  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  She wants to know if there are any interactions or problems with her taking these.  Message to Dr [name]/Pod RN

## 2017-01-02 NOTE — Telephone Encounter (Signed)
lvm with pt stating it is ok to take tumeric with tamoxifen and that she would need to discontinue the vit d 2000 units daily if she has been prescribed vit d 50,000 units weekly

## 2017-01-09 ENCOUNTER — Ambulatory Visit (INDEPENDENT_AMBULATORY_CARE_PROVIDER_SITE_OTHER): Payer: BC Managed Care – PPO | Admitting: General Practice

## 2017-01-09 DIAGNOSIS — Z23 Encounter for immunization: Secondary | ICD-10-CM

## 2017-01-16 ENCOUNTER — Ambulatory Visit (INDEPENDENT_AMBULATORY_CARE_PROVIDER_SITE_OTHER): Payer: BC Managed Care – PPO | Admitting: Family Medicine

## 2017-01-16 ENCOUNTER — Ambulatory Visit: Payer: Self-pay

## 2017-01-16 ENCOUNTER — Encounter: Payer: Self-pay | Admitting: Family Medicine

## 2017-01-16 VITALS — BP 110/78 | HR 78 | Ht 65.0 in | Wt 163.0 lb

## 2017-01-16 DIAGNOSIS — M25539 Pain in unspecified wrist: Secondary | ICD-10-CM

## 2017-01-16 DIAGNOSIS — M654 Radial styloid tenosynovitis [de Quervain]: Secondary | ICD-10-CM

## 2017-01-16 NOTE — Progress Notes (Signed)
[name] [address] [place], [place] [address] Phone: [phone] Subjective:    I'm seeing this patient by the request  of:    CC: Left wrist pain f/u  EVO:[id number]  Erin [name] is a 56 y.o. female coming in with complaint of left wrist and hand pain. Then have more of a de Quervain's tenosynovitis. Patient was given an injection. Tolerated the procedure very well. Patient is feeling much better. 80-90% better. Has been doing exercises regularly. Icing occasionally. Did do the bracing and feeling much better.     Past Medical History:  Diagnosis Date  . Allergic rhinitis due to pollen   . Breast cancer ([place]) 2010  . Breast cancer ([place]) 2016   reoccurrence in scar  . Hypertension   . Hypothyroidism   . Osteopenia   . S/P radiation therapy 07/12/2014 through 08/16/2014    Left chest wall 4000 cGy in 20 sessions, left chest wall boost 1000 cGy in 5 sessions  . Wears glasses    Past Surgical History:  Procedure Laterality Date  . BREAST LUMPECTOMY Left 06/01/2014   Procedure: WIDE LOCAL EXCISION OF RECURRENT LEFT BREAST CANCER;  Surgeon: Erin Bookbinder, MD;  Location: [facility];  Service: General;  Laterality: Left;  . COLONOSCOPY    . cyst removed     left lower jaw - at age 30 yr  . DILATATION & CURRETTAGE/HYSTEROSCOPY WITH RESECTOCOPE N/A 09/16/2013   Procedure: DILATATION & CURETTAGE/HYSTEROSCOPY WITH RESECTOCOPE;  Surgeon: Erin Bruins, MD;  Location: [facility];  Service: Gynecology;  Laterality: N/A;  1 hr.  . INGUINAL HERNIA REPAIR     right side as infant  . LAPAROSCOPIC PELVIC LYMPH NODE BIOPSY    . MASTECTOMY  2010   bilateral mastectomies-lt snbx-radiation post  . MASTECTOMY Bilateral   . OOPHORECTOMY  2016   patient denies  . Right knee arthroscopy  2008  . ROBOTIC ASSISTED BILATERAL SALPINGO OOPHERECTOMY Bilateral 03/13/2015   Procedure: ROBOTIC  ASSISTED BILATERAL SALPINGO OOPHORECTOMY With Washings;  Surgeon: Erin Bruins, MD;  Location: [facility];  Service: Gynecology;  Laterality: Bilateral;  . TONSILLECTOMY    . WISDOM TOOTH EXTRACTION     Social History   Social History  . Marital status: Single    Spouse name: N/A  . Number of children: 0  . Years of education: PhD   Social History Main Topics  . Smoking status: Never Smoker  . Smokeless tobacco: Never Used  . Alcohol use 0.0 oz/week     Comment: rare  . Drug use: No  . Sexual activity: Yes    Birth control/ protection: None   Other Topics Concern  . None   Social History Narrative   She grew up in [place] last 16 yrs in [place]   Rare caffeine use    Allergies  Allergen Reactions  . Pollen Extract   . Adhesive [Tape] Rash   Family History  Problem Relation Age of Onset  . Hypertension Mother   . Cancer Mother        Lung Cancer - long history of tobacco use  . Hypertension Father   . Stroke Maternal Grandfather   . Stroke Paternal Grandfather   . Colon cancer Neg Hx   . Stomach cancer Neg Hx      Past medical history, social, surgical and family history all reviewed in electronic medical record.  No pertanent information unless stated regarding to the chief complaint.  Review of Systems: No headache, visual changes, nausea, vomiting, diarrhea, constipation, dizziness, abdominal pain, skin rash, fevers, chills, night sweats, weight loss, swollen lymph nodes, body aches, joint swelling, muscle aches, chest pain, shortness of breath, mood changes.   Objective  Blood pressure 110/78, pulse 78, height 5\' 5"  (1.651 m), weight 163 lb (73.9 kg).   Systems examined below as of 01/16/17 General: NAD A&O x3 mood, affect normal  HEENT: Pupils equal, extraocular movements intact no nystagmus Respiratory: not short of breath at rest or with speaking Cardiovascular: No lower extremity edema, non tender Skin: Warm dry intact with no signs of  infection or rash on extremities or on axial skeleton. Abdomen: Soft nontender, no masses Neuro: Cranial nerves  intact, neurovascularly intact in all extremities with 2+ DTRs and 2+ pulses. Lymph: No lymphadenopathy appreciated today  Gait normal with good balance and coordination.  MSK: Non tender with full range of motion and good stability and symmetric strength and tone of shoulders, elbows,  knee hips and ankles bilaterally.   Wrist: Left Inspection normal with no visible erythema or swelling. ROM smooth and normal with good flexion and extension and ulnar/radial deviation that is symmetrical with opposite wrist. Palpation is normal over metacarpals, navicular, lunate, and TFCC; tendons without tenderness/ swelling No snuffbox tenderness. No tenderness over Canal of [place]. Strength 5/5 in all directions without pain. Negative Finkelstein, tinel's and phalens. Negative Watson's test.     Impression and Recommendations:     This case required medical decision making of moderate complexity.      Note: This dictation was prepared with Dragon dictation along with smaller phrase technology. Any transcriptional errors that result from this process are unintentional.

## 2017-01-16 NOTE — Assessment & Plan Note (Signed)
Significant improvement after injection. Encourage patient to continue home exercises and icing regimen. Patient come back and see me again in 6 weeks if not completely resolved.

## 2017-01-16 NOTE — Patient Instructions (Signed)
Good to see you  Erin [name] is your friend. Stay active Exercises 2 times a week for at least 1 more month Turmeric still for aq while then as needed look for USP label.  After once weekly vitamin D consider 2000 IU daily  See me when you need me

## 2017-01-19 ENCOUNTER — Encounter: Payer: Self-pay | Admitting: Family Medicine

## 2017-01-20 ENCOUNTER — Other Ambulatory Visit: Payer: Self-pay | Admitting: Nurse Practitioner

## 2017-01-20 DIAGNOSIS — E038 Other specified hypothyroidism: Secondary | ICD-10-CM

## 2017-01-20 NOTE — Telephone Encounter (Signed)
rx sent in for 30 days supply. This is a new dosage [place] changed in 10/2016---pt need to follow up with her PCP (keep an appt in 01/2017).

## 2017-01-31 ENCOUNTER — Other Ambulatory Visit (INDEPENDENT_AMBULATORY_CARE_PROVIDER_SITE_OTHER): Payer: BC Managed Care – PPO

## 2017-01-31 ENCOUNTER — Ambulatory Visit (INDEPENDENT_AMBULATORY_CARE_PROVIDER_SITE_OTHER): Payer: BC Managed Care – PPO | Admitting: Internal Medicine

## 2017-01-31 ENCOUNTER — Encounter: Payer: Self-pay | Admitting: Internal Medicine

## 2017-01-31 ENCOUNTER — Ambulatory Visit: Payer: BC Managed Care – PPO | Admitting: Internal Medicine

## 2017-01-31 VITALS — BP 128/84 | HR 70 | Temp 98.1°F | Ht 65.0 in | Wt 164.0 lb

## 2017-01-31 DIAGNOSIS — M654 Radial styloid tenosynovitis [de Quervain]: Secondary | ICD-10-CM

## 2017-01-31 DIAGNOSIS — E039 Hypothyroidism, unspecified: Secondary | ICD-10-CM | POA: Diagnosis not present

## 2017-01-31 DIAGNOSIS — J0110 Acute frontal sinusitis, unspecified: Secondary | ICD-10-CM

## 2017-01-31 DIAGNOSIS — J019 Acute sinusitis, unspecified: Secondary | ICD-10-CM | POA: Insufficient documentation

## 2017-01-31 LAB — T4, FREE: FREE T4: 1.3 ng/dL (ref 0.60–1.60)

## 2017-01-31 LAB — TSH: TSH: 2.78 u[IU]/mL (ref 0.35–4.50)

## 2017-01-31 MED ORDER — AMOXICILLIN-POT CLAVULANATE 875-125 MG PO TABS: 1.0000 | ORAL_TABLET | Freq: Two times a day (BID) | ORAL | 0 refills | Status: DC

## 2017-01-31 MED ORDER — LOSARTAN POTASSIUM 100 MG PO TABS: ORAL_TABLET | ORAL | 3 refills | Status: DC

## 2017-01-31 NOTE — Progress Notes (Signed)
   Subjective:    Patient ID: Erin [name], female    DOB: Mar 24, 1961, 56 y.o.   MRN: [id number]  HPI The patient is a 56 YO female coming in for follow up of thyroid. Levels have been up and down over the last year. We had taken her down due to higher levels but she did not adjust to that well. She was increased back to 75 mcg synthroid. She is feeling some back to normal with that now. Needs level recheck today.  She is also having a new problem of sinus problems. She has had pain and congestion for about 4-6 weeks now. Some chills intermittent. She is taking claritin and nose spray without any help. She denies cough but has some nose drainage and throat congestion.  She is also following up on her tendonitis which is improving. She had some struggles with getting the right diagnosis and treatment. Eventually got injection which has improved symptoms about 70%. She is taking turmeric which is helping to prevent symptoms. Able to resume activities.   Review of Systems  Constitutional: Positive for chills. Negative for activity change, appetite change, fatigue, fever and unexpected weight change.  HENT: Positive for congestion, ear pain, postnasal drip, rhinorrhea, sinus pressure and sore throat. Negative for ear discharge, sinus pain, trouble swallowing and voice change.   Eyes: Negative.   Respiratory: Negative for cough, chest tightness and shortness of breath.   Cardiovascular: Negative for chest pain, palpitations and leg swelling.  Gastrointestinal: Negative for abdominal distention, abdominal pain, constipation, diarrhea, nausea and vomiting.  Musculoskeletal: Negative.   Skin: Negative.   Neurological: Negative.   Psychiatric/Behavioral: Negative.       Objective:   Physical Exam  Constitutional: She is oriented to person, place, and time. She appears well-developed and well-nourished.  HENT:  Head: Normocephalic and atraumatic.  Frontal sinus pressure, nose with swollen and red  turbinates, oropharynx with redness and drainage.  Eyes: EOM are normal.  Neck: Normal range of motion. No JVD present. No thyromegaly present.  Cardiovascular: Normal rate and regular rhythm.   Pulmonary/Chest: Effort normal and breath sounds normal. No respiratory distress. She has no wheezes. She has no rales.  Abdominal: Soft. Bowel sounds are normal. She exhibits no distension. There is no tenderness. There is no rebound.  Musculoskeletal: She exhibits no edema.  Lymphadenopathy:    She has no cervical adenopathy.  Neurological: She is alert and oriented to person, place, and time. Coordination normal.  Skin: Skin is warm and dry.  Psychiatric: She has a normal mood and affect.   Vitals:   01/31/17 0938  BP: 128/84  Pulse: 70  Temp: 98.1 F (36.7 C)  TempSrc: Oral  SpO2: 99%  Weight: 164 lb (74.4 kg)  Height: 5\' 5"  (1.651 m)      Assessment & Plan:

## 2017-01-31 NOTE — Assessment & Plan Note (Signed)
Checking TSHand free T4 on new dosing synthroid 75 mcg daily.

## 2017-01-31 NOTE — Assessment & Plan Note (Signed)
Much better since injection. She is back to most activities. Continue turmeric.

## 2017-01-31 NOTE — Assessment & Plan Note (Signed)
Rx for augmentin for sinus infection. Continue claritin and nasal saline prn.

## 2017-01-31 NOTE — Patient Instructions (Signed)
We have sent in the augmentin for the sinuses. Take 1 pill twice a day for 10 days.   We have checked the labs today.

## 2017-02-03 ENCOUNTER — Other Ambulatory Visit: Payer: Self-pay | Admitting: Internal Medicine

## 2017-02-03 DIAGNOSIS — Z0000 Encounter for general adult medical examination without abnormal findings: Secondary | ICD-10-CM

## 2017-02-16 ENCOUNTER — Other Ambulatory Visit: Payer: Self-pay | Admitting: Nurse Practitioner

## 2017-02-16 DIAGNOSIS — E038 Other specified hypothyroidism: Secondary | ICD-10-CM

## 2017-02-17 NOTE — Telephone Encounter (Signed)
Patient is wanting to make sure this is a 90 day supply. Dr.Crawford should be the one filling it.

## 2017-02-17 NOTE — Telephone Encounter (Signed)
Reviewed chart pt is up-to-date sent refills to [facility].../lmb  

## 2017-02-28 ENCOUNTER — Other Ambulatory Visit (HOSPITAL_BASED_OUTPATIENT_CLINIC_OR_DEPARTMENT_OTHER): Payer: BC Managed Care – PPO

## 2017-02-28 ENCOUNTER — Ambulatory Visit (HOSPITAL_BASED_OUTPATIENT_CLINIC_OR_DEPARTMENT_OTHER): Payer: BC Managed Care – PPO

## 2017-02-28 VITALS — BP 129/70 | HR 80 | Temp 97.9°F | Resp 18

## 2017-02-28 DIAGNOSIS — C50912 Malignant neoplasm of unspecified site of left female breast: Secondary | ICD-10-CM

## 2017-02-28 DIAGNOSIS — N8500 Endometrial hyperplasia, unspecified: Secondary | ICD-10-CM

## 2017-02-28 DIAGNOSIS — C50412 Malignant neoplasm of upper-outer quadrant of left female breast: Secondary | ICD-10-CM

## 2017-02-28 DIAGNOSIS — M8580 Other specified disorders of bone density and structure, unspecified site: Secondary | ICD-10-CM | POA: Diagnosis not present

## 2017-02-28 LAB — COMPREHENSIVE METABOLIC PANEL
ALBUMIN: 3.9 g/dL (ref 3.5–5.0)
ALK PHOS: 62 U/L (ref 40–150)
ALT: 13 U/L (ref 0–55)
AST: 18 U/L (ref 5–34)
Anion Gap: 7 mEq/L (ref 3–11)
BILIRUBIN TOTAL: 1.17 mg/dL (ref 0.20–1.20)
BUN: 20 mg/dL (ref 7.0–26.0)
CO2: 29 mEq/L (ref 22–29)
CREATININE: 1 mg/dL (ref 0.6–1.1)
Calcium: 10.4 mg/dL (ref 8.4–10.4)
Chloride: 103 mEq/L (ref 98–109)
EGFR: >60 mL/min/{1.73_m2} (ref >60)
GLUCOSE: 90 mg/dL (ref 70–140)
Potassium: 4.6 mEq/L (ref 3.5–5.1)
SODIUM: 139 meq/L (ref 136–145)
TOTAL PROTEIN: 6.8 g/dL (ref 6.4–8.3)

## 2017-02-28 LAB — CBC WITH DIFFERENTIAL/PLATELET
BASO%: 0.3 % (ref 0.0–2.0)
BASOS ABS: 0 10*3/uL (ref 0.0–0.1)
EOS ABS: 0.1 10*3/uL (ref 0.0–0.5)
EOS%: 1.1 % (ref 0.0–7.0)
HCT: 42.6 % (ref 34.8–46.6)
HEMOGLOBIN: 14.6 g/dL (ref 11.6–15.9)
LYMPH%: 24.9 % (ref 14.0–49.7)
MCH: 29.9 pg (ref 25.1–34.0)
MCHC: 34.3 g/dL (ref 31.5–36.0)
MCV: 87.3 fL (ref 79.5–101.0)
MONO#: 0.6 10*3/uL (ref 0.1–0.9)
MONO%: 8.9 % (ref 0.0–14.0)
NEUT%: 64.8 % (ref 38.4–76.8)
NEUTROS ABS: 4.1 10*3/uL (ref 1.5–6.5)
Platelets: 197 10*3/uL (ref 145–400)
RBC: 4.88 10*6/uL (ref 3.70–5.45)
RDW: 12.5 % (ref 11.2–14.5)
WBC: 6.3 10*3/uL (ref 3.9–10.3)
lymph#: 1.6 10*3/uL (ref 0.9–3.3)

## 2017-02-28 MED ORDER — DENOSUMAB 60 MG/ML ~~LOC~~ SOLN
60.0000 mg | Freq: Once | SUBCUTANEOUS | Status: AC
  Administered 2017-02-28: 60 mg via SUBCUTANEOUS
  Filled 2017-02-28: qty 1

## 2017-03-11 ENCOUNTER — Encounter: Payer: Self-pay | Admitting: Obstetrics & Gynecology

## 2017-03-11 ENCOUNTER — Ambulatory Visit: Payer: BC Managed Care – PPO | Admitting: Obstetrics & Gynecology

## 2017-03-11 VITALS — BP 130/88 | Ht 64.75 in | Wt 162.0 lb

## 2017-03-11 DIAGNOSIS — Z9013 Acquired absence of bilateral breasts and nipples: Secondary | ICD-10-CM | POA: Diagnosis not present

## 2017-03-11 DIAGNOSIS — Z01411 Encounter for gynecological examination (general) (routine) with abnormal findings: Secondary | ICD-10-CM | POA: Diagnosis not present

## 2017-03-11 DIAGNOSIS — C50412 Malignant neoplasm of upper-outer quadrant of left female breast: Secondary | ICD-10-CM | POA: Diagnosis not present

## 2017-03-11 DIAGNOSIS — Z90722 Acquired absence of ovaries, bilateral: Secondary | ICD-10-CM

## 2017-03-11 DIAGNOSIS — Z170 Estrogen receptor positive status [ER+]: Secondary | ICD-10-CM

## 2017-03-11 DIAGNOSIS — Z780 Asymptomatic menopausal state: Secondary | ICD-10-CM

## 2017-03-11 NOTE — Progress Notes (Signed)
Erin [name] 02/12/1961 [id number]   History:    56 y.o. G0 single  RP:  Established patient presenting for annual gyn exam   HPI: Surgical menopause status post laparoscopic bilateral salpingo-oophorectomy November 2016.  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  A PET scan in May 06, 2014 showed a 1.9 cm sclerotic lesion in the right initial tuberosity.  Followed by MRIs of the pelvis with no evidence of active disease.  She is on Arimidex.  Her bone density showed improvement of osteopenia on Prolia.  A repeat bone density is planned for August 2019.  No current pelvic pain.  No postmenopausal bleeding.  Abstinent currently.  Very mild menopausal symptoms.  Urine and bowel movements normal.  Past medical history,surgical history, family history and social history were all reviewed and documented in the EPIC chart.  Gynecologic History No LMP recorded. Patient is not currently having periods (Reason: Other). Contraception: post menopausal status/S/p BSO Last Pap: 02/2016. Results were: Negative Last mammogram: S/P Bilateral Mastectomy.   Obstetric History OB History  Gravida Para Term Preterm AB Living  0 0 0 0 0 0  SAB TAB Ectopic Multiple Live Births  0 0 0 0 0         ROS: A ROS was performed and pertinent positives and negatives are included in the history.  GENERAL: No fevers or chills. HEENT: No change in vision, no earache, sore throat or sinus congestion. NECK: No pain or stiffness. CARDIOVASCULAR: No chest pain or pressure. No palpitations. PULMONARY: No shortness of breath, cough or wheeze. GASTROINTESTINAL: No abdominal pain, nausea, vomiting or diarrhea, melena or bright red blood per rectum. GENITOURINARY: No urinary frequency, urgency, hesitancy or dysuria. MUSCULOSKELETAL: No joint or muscle pain, no back  pain, no recent trauma. DERMATOLOGIC: No rash, no itching, no lesions. ENDOCRINE: No polyuria, polydipsia, no heat or cold intolerance. No recent change in weight. HEMATOLOGICAL: No anemia or easy bruising or bleeding. NEUROLOGIC: No headache, seizures, numbness, tingling or weakness. PSYCHIATRIC: No depression, no loss of interest in normal activity or change in sleep pattern.     Exam:   BP 130/88   Ht 5' 4.75" (1.645 m)   Wt 162 lb (73.5 kg)   BMI 27.17 kg/m   Body mass index is 27.17 kg/m.  General appearance : Well developed well nourished female. No acute distress HEENT: Eyes: no retinal hemorrhage or exudates,  Neck supple, trachea midline, no carotid bruits, no thyroidmegaly Lungs: Clear to auscultation, no rhonchi or wheezes, or rib retractions  Heart: Regular rate and rhythm, no murmurs or gallops Breast:Examined in sitting and supine position were symmetrical in appearance, no palpable masses or tenderness,  no skin retraction, no nipple inversion, no nipple discharge, no skin discoloration, no axillary or supraclavicular lymphadenopathy Abdomen: no palpable masses or tenderness, no rebound or guarding Extremities: no edema or skin discoloration or tenderness  Pelvic: Vulva normal  Bartholin, Urethra, Skene Glands: Within normal limits             Vagina: No gross lesions or discharge  Cervix: No gross lesions or discharge.  Pap reflex done.  Uterus  AV, normal size, shape and consistency, non-tender and mobile  Adnexa  Without masses or tenderness  Anus and perineum  normal    Assessment/Plan:  56 y.o. female for annual exam   1. Encounter for gynecological  examination with abnormal finding Normal gynecologic exam status post bilateral salpingo-oophorectomy.  Pap reflex done.  Breast exam status post bilateral mastectomy.  Colonoscopy December 2013.  Health labs with family physician.  2. Menopause present No PMB.  Well tolerated.  Vitamin D supplements with calcium  rich nutrition and regular weightbearing physical activity recommended.  Last bone density in 2017 showing improved osteopenia on Prolia.  3. S/P BSO (bilateral salpingo-oophorectomy)   4. Malignant neoplasm of upper-outer quadrant of left breast in female, estrogen receptor positive ([place]) Recurrent Lt breast Ca.  Followed by Dr [name].  5. History of bilateral mastectomy   Outpatient Encounter Medications as of 03/11/2017  Medication Sig  . acetaminophen (TYLENOL) 500 MG tablet Take 500 mg by mouth every 6 (six) hours as needed for moderate pain or headache.  Marland Kitchen acyclovir ointment (ZOVIRAX) 5 % Apply 1 application topically every 3 (three) hours. As needed  . anastrozole (ARIMIDEX) 1 MG tablet Take 1 tablet (1 mg total) by mouth daily.  . Cholecalciferol (VITAMIN D3) 2000 UNITS capsule Take 2,000 Units by mouth daily.    Marland Kitchen denosumab (PROLIA) 60 MG/ML SOLN injection Inject 60 mg into the skin every 6 (six) months. Administer in upper arm, thigh, or abdomen  . levothyroxine (SYNTHROID, LEVOTHROID) 75 MCG tablet TAKE 1 TABLET(75 MCG) BY MOUTH DAILY BEFORE BREAKFAST  . loratadine (CLARITIN) 10 MG tablet Take 10 mg by mouth daily as needed for allergies.   Marland Kitchen losartan (COZAAR) 100 MG tablet TAKE 1 TABLET(100 MG) BY MOUTH DAILY  . Polyethyl Glycol-Propyl Glycol (SYSTANE OP) Apply 1 drop to eye as needed (for allergies).  . sodium chloride (OCEAN) 0.65 % SOLN nasal spray Place 1 spray into both nostrils as needed for congestion.  . Turmeric 500 MG CAPS Take 500 mg by mouth 2 (two) times daily.  . valACYclovir (VALTREX) 500 MG tablet Take 1 tablet (500 mg total) by mouth daily as needed.  . Vitamin D, Ergocalciferol, (DRISDOL) 50000 units CAPS capsule Take 1 capsule (50,000 Units total) by mouth every 7 (seven) days.  Marland Kitchen ibuprofen (ADVIL,MOTRIN) 200 MG tablet Take 200 mg by mouth every 6 (six) hours as needed for moderate pain.  . [DISCONTINUED] amoxicillin-clavulanate (AUGMENTIN) 875-125 MG  tablet Take 1 tablet by mouth 2 (two) times daily.  . [DISCONTINUED] meloxicam (MOBIC) 15 MG tablet Take 1 tablet (15 mg total) by mouth daily as needed for pain.  . [DISCONTINUED] metroNIDAZOLE (METROGEL) 1 % gel Apply topically daily as needed.   No facility-administered encounter medications on file as of 03/11/2017.     Princess Bruins MD, 2:53 PM 03/11/2017

## 2017-03-11 NOTE — Addendum Note (Signed)
Addended by: Thurnell Garbe A on: 03/11/2017 03:48 PM   Modules accepted: Orders

## 2017-03-11 NOTE — Patient Instructions (Signed)
1. Encounter for gynecological examination with abnormal finding Normal gynecologic exam status post bilateral salpingo-oophorectomy.  Pap reflex done.  Breast exam status post bilateral mastectomy.  Colonoscopy December 2013.  Health labs with family physician.  2. Menopause present No PMB.  Well tolerated.  Vitamin D supplements with calcium rich nutrition and regular weightbearing physical activity recommended.  Last bone density in 2017 showing improved osteopenia on Prolia.  3. S/P BSO (bilateral salpingo-oophorectomy)   4. Malignant neoplasm of upper-outer quadrant of left breast in female, estrogen receptor positive ([place]) Recurrent Lt breast Ca.  Followed by Dr [name].  5. History of bilateral mastectomy   Outpatient Encounter Medications as of 03/11/2017  Medication Sig  . acetaminophen (TYLENOL) 500 MG tablet Take 500 mg by mouth every 6 (six) hours as needed for moderate pain or headache.  Marland Kitchen acyclovir ointment (ZOVIRAX) 5 % Apply 1 application topically every 3 (three) hours. As needed  . anastrozole (ARIMIDEX) 1 MG tablet Take 1 tablet (1 mg total) by mouth daily.  . Cholecalciferol (VITAMIN D3) 2000 UNITS capsule Take 2,000 Units by mouth daily.    Marland Kitchen denosumab (PROLIA) 60 MG/ML SOLN injection Inject 60 mg into the skin every 6 (six) months. Administer in upper arm, thigh, or abdomen  . levothyroxine (SYNTHROID, LEVOTHROID) 75 MCG tablet TAKE 1 TABLET(75 MCG) BY MOUTH DAILY BEFORE BREAKFAST  . loratadine (CLARITIN) 10 MG tablet Take 10 mg by mouth daily as needed for allergies.   Marland Kitchen losartan (COZAAR) 100 MG tablet TAKE 1 TABLET(100 MG) BY MOUTH DAILY  . Polyethyl Glycol-Propyl Glycol (SYSTANE OP) Apply 1 drop to eye as needed (for allergies).  . sodium chloride (OCEAN) 0.65 % SOLN nasal spray Place 1 spray into both nostrils as needed for congestion.  . Turmeric 500 MG CAPS Take 500 mg by mouth 2 (two) times daily.  . valACYclovir (VALTREX) 500 MG tablet Take 1 tablet (500 mg  total) by mouth daily as needed.  . Vitamin D, Ergocalciferol, (DRISDOL) 50000 units CAPS capsule Take 1 capsule (50,000 Units total) by mouth every 7 (seven) days.  Marland Kitchen ibuprofen (ADVIL,MOTRIN) 200 MG tablet Take 200 mg by mouth every 6 (six) hours as needed for moderate pain.  . [DISCONTINUED] amoxicillin-clavulanate (AUGMENTIN) 875-125 MG tablet Take 1 tablet by mouth 2 (two) times daily.  . [DISCONTINUED] meloxicam (MOBIC) 15 MG tablet Take 1 tablet (15 mg total) by mouth daily as needed for pain.  . [DISCONTINUED] metroNIDAZOLE (METROGEL) 1 % gel Apply topically daily as needed.   No facility-administered encounter medications on file as of 03/11/2017.    Erin [name], it was a pleasure seeing you today!  I will inform you of your results as soon as available.   Health Maintenance for Postmenopausal Women Menopause is a normal process in which your reproductive ability comes to an end. This process happens gradually over a span of months to years, usually between the ages of 72 and 36. Menopause is complete when you have missed 12 consecutive menstrual periods. It is important to talk with your health care provider about some of the most common conditions that affect postmenopausal women, such as heart disease, cancer, and bone loss (osteoporosis). Adopting a healthy lifestyle and getting preventive care can help to promote your health and wellness. Those actions can also lower your chances of developing some of these common conditions. What should I know about menopause? During menopause, you may experience a number of symptoms, such as:  Moderate-to-severe hot flashes.  Night sweats.  Decrease  in sex drive.  Mood swings.  Headaches.  Tiredness.  Irritability.  Memory problems.  Insomnia.  Choosing to treat or not to treat menopausal changes is an individual decision that you make with your health care provider. What should I know about hormone replacement therapy and  supplements? Hormone therapy products are effective for treating symptoms that are associated with menopause, such as hot flashes and night sweats. Hormone replacement carries certain risks, especially as you become older. If you are thinking about using estrogen or estrogen with progestin treatments, discuss the benefits and risks with your health care provider. What should I know about heart disease and stroke? Heart disease, heart attack, and stroke become more likely as you age. This may be due, in part, to the hormonal changes that your body experiences during menopause. These can affect how your body processes dietary fats, triglycerides, and cholesterol. Heart attack and stroke are both medical emergencies. There are many things that you can do to help prevent heart disease and stroke:  Have your blood pressure checked at least every 1-2 years. High blood pressure causes heart disease and increases the risk of stroke.  If you are 38-2 years old, ask your health care provider if you should take aspirin to prevent a heart attack or a stroke.  Do not use any tobacco products, including cigarettes, chewing tobacco, or electronic cigarettes. If you need help quitting, ask your health care provider.  It is important to eat a healthy diet and maintain a healthy weight. ? Be sure to include plenty of vegetables, fruits, low-fat dairy products, and lean protein. ? Avoid eating foods that are high in solid fats, added sugars, or salt (sodium).  Get regular exercise. This is one of the most important things that you can do for your health. ? Try to exercise for at least 150 minutes each week. The type of exercise that you do should increase your heart rate and make you sweat. This is known as moderate-intensity exercise. ? Try to do strengthening exercises at least twice each week. Do these in addition to the moderate-intensity exercise.  Know your numbers.Ask your health care provider to check  your cholesterol and your blood glucose. Continue to have your blood tested as directed by your health care provider.  What should I know about cancer screening? There are several types of cancer. Take the following steps to reduce your risk and to catch any cancer development as early as possible. Breast Cancer  Practice breast self-awareness. ? This means understanding how your breasts normally appear and feel. ? It also means doing regular breast self-exams. Let your health care provider know about any changes, no matter how small.  If you are 62 or older, have a clinician do a breast exam (clinical breast exam or CBE) every year. Depending on your age, family history, and medical history, it may be recommended that you also have a yearly breast X-ray (mammogram).  If you have a family history of breast cancer, talk with your health care provider about genetic screening.  If you are at high risk for breast cancer, talk with your health care provider about having an MRI and a mammogram every year.  Breast cancer (BRCA) gene test is recommended for women who have family members with BRCA-related cancers. Results of the assessment will determine the need for genetic counseling and BRCA1 and for BRCA2 testing. BRCA-related cancers include these types: ? Breast. This occurs in males or females. ? Ovarian. ? Tubal. This  may also be called fallopian tube cancer. ? Cancer of the abdominal or pelvic lining (peritoneal cancer). ? Prostate. ? Pancreatic.  Cervical, Uterine, and Ovarian Cancer Your health care provider may recommend that you be screened regularly for cancer of the pelvic organs. These include your ovaries, uterus, and vagina. This screening involves a pelvic exam, which includes checking for microscopic changes to the surface of your cervix (Pap test).  For women ages 21-65, health care providers may recommend a pelvic exam and a Pap test every three years. For women ages 6-65,  they may recommend the Pap test and pelvic exam, combined with testing for human papilloma virus (HPV), every five years. Some types of HPV increase your risk of cervical cancer. Testing for HPV may also be done on women of any age who have unclear Pap test results.  Other health care providers may not recommend any screening for nonpregnant women who are considered low risk for pelvic cancer and have no symptoms. Ask your health care provider if a screening pelvic exam is right for you.  If you have had past treatment for cervical cancer or a condition that could lead to cancer, you need Pap tests and screening for cancer for at least 20 years after your treatment. If Pap tests have been discontinued for you, your risk factors (such as having a new sexual partner) need to be reassessed to determine if you should start having screenings again. Some women have medical problems that increase the chance of getting cervical cancer. In these cases, your health care provider may recommend that you have screening and Pap tests more often.  If you have a family history of uterine cancer or ovarian cancer, talk with your health care provider about genetic screening.  If you have vaginal bleeding after reaching menopause, tell your health care provider.  There are currently no reliable tests available to screen for ovarian cancer.  Lung Cancer Lung cancer screening is recommended for adults 47-31 years old who are at high risk for lung cancer because of a history of smoking. A yearly low-dose CT scan of the lungs is recommended if you:  Currently smoke.  Have a history of at least 30 pack-years of smoking and you currently smoke or have quit within the past 15 years. A pack-year is smoking an average of one pack of cigarettes per day for one year.  Yearly screening should:  Continue until it has been 15 years since you quit.  Stop if you develop a health problem that would prevent you from having lung  cancer treatment.  Colorectal Cancer  This type of cancer can be detected and can often be prevented.  Routine colorectal cancer screening usually begins at age 46 and continues through age 69.  If you have risk factors for colon cancer, your health care provider may recommend that you be screened at an earlier age.  If you have a family history of colorectal cancer, talk with your health care provider about genetic screening.  Your health care provider may also recommend using home test kits to check for hidden blood in your stool.  A small camera at the end of a tube can be used to examine your colon directly (sigmoidoscopy or colonoscopy). This is done to check for the earliest forms of colorectal cancer.  Direct examination of the colon should be repeated every 5-10 years until age 43. However, if early forms of precancerous polyps or small growths are found or if you have a  family history or genetic risk for colorectal cancer, you may need to be screened more often.  Skin Cancer  Check your skin from head to toe regularly.  Monitor any moles. Be sure to tell your health care provider: ? About any new moles or changes in moles, especially if there is a change in a mole's shape or color. ? If you have a mole that is larger than the size of a pencil eraser.  If any of your family members has a history of skin cancer, especially at a young age, talk with your health care provider about genetic screening.  Always use sunscreen. Apply sunscreen liberally and repeatedly throughout the day.  Whenever you are outside, protect yourself by wearing long sleeves, pants, a wide-brimmed hat, and sunglasses.  What should I know about osteoporosis? Osteoporosis is a condition in which bone destruction happens more quickly than new bone creation. After menopause, you may be at an increased risk for osteoporosis. To help prevent osteoporosis or the bone fractures that can happen because of  osteoporosis, the following is recommended:  If you are 39-65 years old, get at least 1,000 mg of calcium and at least 600 mg of vitamin D per day.  If you are older than age 4 but younger than age 62, get at least 1,200 mg of calcium and at least 600 mg of vitamin D per day.  If you are older than age 54, get at least 1,200 mg of calcium and at least 800 mg of vitamin D per day.  Smoking and excessive alcohol intake increase the risk of osteoporosis. Eat foods that are rich in calcium and vitamin D, and do weight-bearing exercises several times each week as directed by your health care provider. What should I know about how menopause affects my mental health? Depression may occur at any age, but it is more common as you become older. Common symptoms of depression include:  Low or sad mood.  Changes in sleep patterns.  Changes in appetite or eating patterns.  Feeling an overall lack of motivation or enjoyment of activities that you previously enjoyed.  Frequent crying spells.  Talk with your health care provider if you think that you are experiencing depression. What should I know about immunizations? It is important that you get and maintain your immunizations. These include:  Tetanus, diphtheria, and pertussis (Tdap) booster vaccine.  Influenza every year before the flu season begins.  Pneumonia vaccine.  Shingles vaccine.  Your health care provider may also recommend other immunizations. This information is not intended to replace advice given to you by your health care provider. Make sure you discuss any questions you have with your health care provider. Document Released: 05/31/2005 Document Revised: 10/27/2015 Document Reviewed: 01/10/2015 Elsevier Interactive Patient Education  2018 [employer].

## 2017-03-14 ENCOUNTER — Encounter (HOSPITAL_COMMUNITY): Payer: Self-pay

## 2017-03-14 ENCOUNTER — Ambulatory Visit (HOSPITAL_COMMUNITY)
Admission: RE | Admit: 2017-03-14 | Discharge: 2017-03-14 | Disposition: A | Discharge status: Home / Self Care | Payer: BC Managed Care – PPO | Source: Ambulatory Visit | Attending: Oncology | Admitting: Oncology

## 2017-03-14 DIAGNOSIS — Z170 Estrogen receptor positive status [ER+]: Principal | ICD-10-CM

## 2017-03-14 DIAGNOSIS — C50412 Malignant neoplasm of upper-outer quadrant of left female breast: Secondary | ICD-10-CM

## 2017-03-14 DIAGNOSIS — C50912 Malignant neoplasm of unspecified site of left female breast: Secondary | ICD-10-CM

## 2017-03-17 ENCOUNTER — Other Ambulatory Visit: Payer: Self-pay | Admitting: *Deleted

## 2017-03-17 DIAGNOSIS — M899 Disorder of bone, unspecified: Secondary | ICD-10-CM

## 2017-03-17 DIAGNOSIS — Z170 Estrogen receptor positive status [ER+]: Principal | ICD-10-CM

## 2017-03-17 DIAGNOSIS — C50412 Malignant neoplasm of upper-outer quadrant of left female breast: Secondary | ICD-10-CM

## 2017-03-17 DIAGNOSIS — C50912 Malignant neoplasm of unspecified site of left female breast: Secondary | ICD-10-CM

## 2017-03-17 LAB — PAP IG W/ RFLX HPV ASCU

## 2017-03-17 NOTE — Progress Notes (Signed)
This RN received message from pt stating she reschedule her MRI to this Friday due to upon arriving order was for lumbar spine- Erin [name] stated concern due to previously having an MRI of her pelvis.  Erin [name] rescheduled MRI for this coming Friday and wanted to clarify appropriate order.  Per review with MD- order entered for MRI of the pelvis.  Pt made aware of above.

## 2017-03-18 ENCOUNTER — Encounter: Payer: Self-pay | Admitting: *Deleted

## 2017-03-21 ENCOUNTER — Ambulatory Visit (HOSPITAL_COMMUNITY)
Admission: RE | Admit: 2017-03-21 | Discharge: 2017-03-21 | Disposition: A | Discharge status: Home / Self Care | Payer: BC Managed Care – PPO | Source: Ambulatory Visit | Attending: Oncology | Admitting: Oncology

## 2017-03-21 ENCOUNTER — Ambulatory Visit (HOSPITAL_COMMUNITY): Payer: BC Managed Care – PPO

## 2017-03-21 DIAGNOSIS — C50412 Malignant neoplasm of upper-outer quadrant of left female breast: Secondary | ICD-10-CM | POA: Insufficient documentation

## 2017-03-21 DIAGNOSIS — M899 Disorder of bone, unspecified: Secondary | ICD-10-CM | POA: Diagnosis not present

## 2017-03-21 DIAGNOSIS — C50912 Malignant neoplasm of unspecified site of left female breast: Secondary | ICD-10-CM

## 2017-03-21 DIAGNOSIS — Z170 Estrogen receptor positive status [ER+]: Secondary | ICD-10-CM | POA: Insufficient documentation

## 2017-03-21 MED ORDER — GADOBENATE DIMEGLUMINE 529 MG/ML IV SOLN
20.0000 mL | Freq: Once | INTRAVENOUS | Status: AC | PRN
  Administered 2017-03-21: 16 mL via INTRAVENOUS

## 2017-03-24 NOTE — Progress Notes (Signed)
Erin [name]  MR#: [id number]    PCP: [name], MD GYN: Thomes Cake SU: Rolm Bookbinder OTHER MD: Crista Luria, [address], JN Mann, Arloa Koh, Bonner Puna, Saima Athar, Augustina Mood DDS CHIEF COMPLAINT: Recurrent breast cancer  CURRENT THERAPY: anastrozole; denosumab/Prolia  INTERVAL HISTORY: Erin [name] returns today for follow-up and treatment of her estrogen receptor positive breast cancer.  She continues on anastrozole, with good tolerance. She has hot flashes that are tolerable. She notes that vaginal dryness is not a major problem.  Since her last visit to the office, she underwent a MRI Pelvis on 03/21/2017 with results showing: Unchanged bony legion in the right ischial tuberosity. No new osseous lesion are identified.   She also had a pap smear completed on 03/11/2017 that resulted negative.      REVIEW OF SYSTEMS: Erin [name] reports that notes that nothing is hugely different with her. She notes that there hair is turning more gray. She reports that she feels better compared to last year. She notes that she had the original shingles vaccine and would like more advice about getting the updated vaccine. She also reports that she was having trouble with her back and her thumb. 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 Ultimately, she got a second opinion and followed up with Dr. Hulan Saas who gave her a Cortizone shot for inflammation and she wore a hand brace for two weeks. She notes that this helped her greatly. She is continuing to do exercises to increase motion of the thumb. Subsequently, her back pain has subsided as well. She was placed on vitamin D 50,000 units per week. She is also taking 500 mg of turmeric BID. For exercise, she mainly walks outside. She also has an elliptical and sometimes she goes to the gym and does weight lifting. She denies unusual headaches, visual changes, nausea, vomiting, or dizziness. There  has been no unusual cough, phlegm production, or pleurisy. This been no change in bowel or bladder habits. She denies unexplained fatigue or unexplained weight loss, bleeding, rash, or fever. A detailed review of systems was otherwise stable.    BREAST CANCER HISTORY From the original intake note 04/07/2011:  Ms. Mealy had routine screening mammography December of 2009 at the [facility] in [place], [place], showing an increasing asymmetry in her left breast. She was recalled for additional views December 29. She was noted to have heterogeneously dense breasts. It was a 5 cm area of architectural distortion in the lateral aspect of the left breast with no associated calcifications. There was a second lesion medial to this. Ultrasound showed a highly suspicious hypoechoic irregularly marginated mass measuring 3 cm at the 2:30 position 5 cm from the nipple. This was palpable to the mammographer. The second area in question I measured 7 mm and a third lesion was noted measuring 5 mm. Some left axillary lymph nodes were morphologically normal.  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 Bilateral breast MRIs were performed 05/04/2008 and showed, in the left breast, a large lobulated mass measuring up to 4.3 cm, and including both of the apparently separate masses the previously biopsied. In the right breast there were 2 indeterminate lesions. These were evaluated further with breast specific gamma imaging performed January 14 in both lesions were negative. The lesion in the left breast was markedly  abnormal.  Given this complex history and with the background of significant breast density, the patient opted for bilateral mastectomies with left sentinel lymph node sampling. This was performed of 06/27/2008 and showed([id number]. [id number]) in the right  breast, no malignancy. In the left breast there was a 3.2 cm invasive ductal carcinoma, grade 1, focally extending to the anterior margin of the lower outer quadrant. There was extensive angiolymphatic invasion, but both sentinel lymph nodes on the left were negative.  The patient received left-sided postmastectomy radiation including of the left chest wall and left supraclavicular fossa to a total dose of 50.4 Gy plus a 10 Gy scar boost. She had an Oncotype DX recurrence score of 17, further discussed below. She decided to forego reconstruction. She was tested for BRCA1 and 2 and was found to be negative. Given her overall prognosis, she did not receive chemotherapy, but started tamoxifen February of 2010, with good tolerance.  RECURRENT DISEASE: SUMMARY OF INITIAL PRESENTATION  Erin [name] took tamoxifen for 5 years with no evidence of active disease area did in May 2015 we decided to stop the tamoxifen because of problems with endometrial hyperplasia. 6 months later, at the November visit, she was found to have a change in a small area of scaring her left inframammary fold. She was referred to dermatology and biopsy 04/21/2014 showed ([id number]) recurrent ductal adenocarcinoma (positive for gross cystic disease fluid protein, estrogen, and progesterone). She then underwent a staging PET scan in [facility] 05/06/2014. This showed a 1.9 cm sclerotic lesion in the right ischial tuberosity, with a maximum standard uptake value of 4.6. A sclerotic lesion in the left side of the L3 vertebral body measuring 1.6 cm had no hypermetabolic activity.  Her subsequent history is as detailed below.   Past Medical History:  Diagnosis Date  . Allergic rhinitis due to pollen   . Breast cancer ([place]) 2010  . Breast cancer ([place]) 2016   reoccurrence in scar  . Hypertension   . Hypothyroidism   . Osteopenia   . S/P radiation therapy 07/12/2014 through  08/16/2014    Left chest wall 4000 cGy in 20 sessions, left chest wall boost 1000 cGy in 5 sessions  . Wears glasses   Diverticulosis       Past Surgical History:  Procedure Laterality Date  . BREAST LUMPECTOMY Left 06/01/2014   Procedure: WIDE LOCAL EXCISION OF RECURRENT LEFT BREAST CANCER;  Surgeon: Rolm Bookbinder, MD;  Location: [facility];  Service: General;  Laterality: Left;  . COLONOSCOPY    . cyst removed     left lower jaw - at age 47 yr  . DILATATION & CURRETTAGE/HYSTEROSCOPY WITH RESECTOCOPE N/A 09/16/2013   Procedure: DILATATION & CURETTAGE/HYSTEROSCOPY WITH RESECTOCOPE;  Surgeon: Princess Bruins, MD;  Location: [facility];  Service: Gynecology;  Laterality: N/A;  1 hr.  . INGUINAL HERNIA REPAIR     right side as infant  . LAPAROSCOPIC PELVIC LYMPH NODE BIOPSY    . MASTECTOMY  2010   bilateral mastectomies-lt snbx-radiation post  . MASTECTOMY Bilateral   . OOPHORECTOMY  2016   patient denies  . Right knee arthroscopy  2008  . ROBOTIC ASSISTED BILATERAL SALPINGO OOPHERECTOMY Bilateral 03/13/2015   Procedure: ROBOTIC ASSISTED BILATERAL SALPINGO OOPHORECTOMY With Washings;  Surgeon: Princess Bruins, MD;  Location: [facility];  Service: Gynecology;  Laterality: Bilateral;  . TONSILLECTOMY    . WISDOM TOOTH EXTRACTION      FAMILY HISTORY The patient's mother died in 8101 from complications of  lung cancer. The patient has not been in touch with her father her for approximately 30 years. She had no sisters, 1 brother, who is in good health. There is no breast or ovarian cancer in the family to her knowledge.  GYNECOLOGIC HISTORY:  GX P0, menarche at around age 53, the patient continued to have periods despite being on tamoxifen, although more irregularly. 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 Her work involves quite a bit of travel.      ADVANCED DIRECTIVES: not in place  HEALTH MAINTENANCE:      Social History   Tobacco Use  . Smoking status: Never Smoker  . Smokeless tobacco: Never Used  Substance Use Topics  . Alcohol use: No    Alcohol/week: 0.0 oz    Frequency: Never    Comment: rare  . Drug use: No      Colonoscopy: December 2013 [facility])  PAP: Feb 2011  Bone density: 06/2009, normal  Cholesterol: "good"      Allergies  Allergen Reactions  . Pollen Extract   . Adhesive [Tape] Rash    MEDICATIONS:    Current Outpatient Medications  Medication Sig Dispense Refill  . acetaminophen (TYLENOL) 500 MG tablet Take 500 mg by mouth every 6 (six) hours as needed for moderate pain or headache.    Marland Kitchen acyclovir ointment (ZOVIRAX) 5 % Apply 1 application topically every 3 (three) hours. As needed 5 g 5  . anastrozole (ARIMIDEX) 1 MG tablet Take 1 tablet (1 mg total) by mouth daily. 90 tablet 4  . Cholecalciferol (VITAMIN D3) 2000 UNITS capsule Take 2,000 Units by mouth daily.      Marland Kitchen denosumab (PROLIA) 60 MG/ML SOLN injection Inject 60 mg into the skin every 6 (six) months. Administer in upper arm, thigh, or abdomen    . ibuprofen (ADVIL,MOTRIN) 200 MG tablet Take 200 mg by mouth every 6 (six) hours as needed for moderate pain.    Marland Kitchen levothyroxine (SYNTHROID, LEVOTHROID) 75 MCG tablet TAKE 1 TABLET(75 MCG) BY MOUTH DAILY BEFORE BREAKFAST 90 tablet 1  . loratadine (CLARITIN) 10 MG tablet Take 10 mg by mouth daily as needed for allergies.     Marland Kitchen losartan (COZAAR) 100 MG tablet TAKE 1 TABLET(100 MG) BY MOUTH DAILY 90 tablet 3  . Polyethyl Glycol-Propyl Glycol (SYSTANE OP) Apply 1 drop to eye as needed (for allergies).    . sodium chloride (OCEAN) 0.65 % SOLN nasal spray Place 1 spray into both nostrils as needed for congestion.    . Turmeric 500 MG CAPS Take 500 mg by mouth 2 (two) times daily.    . valACYclovir (VALTREX) 500 MG tablet Take 1 tablet (500 mg total) by mouth daily as needed. 30 tablet 4  . Vitamin D,  Ergocalciferol, (DRISDOL) 50000 units CAPS capsule Take 1 capsule (50,000 Units total) by mouth every 7 (seven) days. 12 capsule 0   No current facility-administered medications for this visit.     OBJECTIVE:  Middle-aged white woman who appears well    Vitals:   03/25/17 1520  BP: 134/73  Pulse: 74  Resp: 18  Temp: 98 F (36.7 C)  SpO2: 98%     Body mass index is 27.12 kg/m.     ECOG performance status:0  Sclerae unicteric, pupils round and equal Oropharynx clear and moist No cervical or supraclavicular adenopathy Lungs no rales or rhonchi Heart regular rate and rhythm Abd soft, nontender,  positive bowel sounds MSK no focal spinal tenderness, no upper extremity lymphedema Neuro: nonfocal, well oriented, appropriate affect Breasts: Status post bilateral mastectomies.  There is no evidence of chest wall recurrence.  Both axillae are benign.   LABS:           Chemistry      Component Value Date/Time   NA 139 02/28/2017 1521   K 4.6 02/28/2017 1521   CL 102 04/08/2016 0933   CL 105 08/10/2012 0944   CO2 29 02/28/2017 1521   BUN 20.0 02/28/2017 1521   CREATININE 1.0 02/28/2017 1521      Component Value Date/Time   CALCIUM 10.4 02/28/2017 1521   ALKPHOS 62 02/28/2017 1521   AST 18 02/28/2017 1521   ALT 13 02/28/2017 1521   BILITOT 1.17 02/28/2017 1521         Lab Results  Component Value Date   WBC 6.3 02/28/2017   HGB 14.6 02/28/2017   HCT 42.6 02/28/2017   MCV 87.3 02/28/2017   PLT 197 02/28/2017   NEUTROABS 4.1 02/28/2017    STUDIES  Mr Pelvis W Wo Contrast  Result Date: 03/22/2017 CLINICAL DATA:  History of breast cancer with known pelvic bone lesion. Prior biopsy was negative. Evaluate for interval change. EXAM: MRI PELVIS WITHOUT AND WITH CONTRAST TECHNIQUE: Multiplanar multisequence MR imaging of the pelvis was performed both before and after administration of intravenous contrast. CONTRAST:  51m MULTIHANCE GADOBENATE DIMEGLUMINE 529 MG/ML IV  SOLN COMPARISON:  MRI pelvis dated April 13, 2016. FINDINGS: Bones/Joint/Cartilage The T1 hypointense lesion in the right ischial tuberosity is unchanged in size, measuring 2.2 x 1.6 cm (series 3, image 21). Unchanged faint T2 hyperintensity without enhancement. No new osseous lesions identified. No acute fracture or malalignment. No hip joint effusion. Ligaments Suboptimally assessed by CT. Muscles and Tendons No muscle edema or fatty atrophy. The bilateral gluteal, hamstring, and iliopsoas tendons are unremarkable. Soft tissues The visualized intrapelvic contents are unremarkable. IMPRESSION: 1. Unchanged bony lesion in the right ischial tuberosity. No new osseous lesions are identified. Electronically Signed   By: WTitus DubinM.D.   On: 03/22/2017 11:20     ASSESSMENT: 56y.o.  BRCA negative [place] woman ,  (1) status post bilateral mastectomies January 2010 for a left-sided upper outer quadrant T2 N0 (stage IIA) invasive ductal carcinoma, grade 1, strongly estrogen and progesterone receptor positive, HER-2 nonamplified,   (2) Oncotype DX recurrence score of 17, predicting a 10-12% distant recurrence risk over 10 years if her only adjuvant treatment is tamoxifen for 5 years  (3) status post Left postmastectomy radiation including Left supraclavicular fossa (5040 cGy in 28 sessions)  (4) on tamoxifen starting February of 2010, stopped May 2015 because of endometrial hyperplasia  (5) osteopenia-- T score -1.2 on repeated bone density scans 09/24/2011 and 12/12/2013  RECURRENT DISEASE (6) skin biopsy of a lesion under the left breast scar 04/21/2014 shows adenocarcinoma, gross cystic disease fluid protein, estrogen and progesterone receptor positive  (a) s/p wide excision with negative though close margins 06/01/2014, prognostic panel pending  (b) radiation to Left chest wall 4000 cGy in 20 sessions, left schest wall boost 1000 cGy in 5 session 07/12/2014 through  08/16/2014  (7) PET scan and bone scan show only one additional area of concern, in the right ischiial tuberosity--  (a) biopsy 05/24/2014 showed no evidence of malignancy  (b) zolendronate started 06/06/2014, poorly tolerated  (c) denosumab/ Xgeva started 09/26/2014, switched to Prolia as of May 2017  (d) repeat  bone scan 12/20/2014 shows no new areas of disease, and the previously biopsied area to be delivered  (e) restaging studies 07/07/2015 do not support a diagnosis of metastatic disease.  (f) MRI of the pelvis 04/13/2016 shows the 2.2 lesion in the right is she'll tuberosity to be unchanged  (8) goserelin monthly started 05/09/2016, last dose 02/20/2015  (a) s/p BSO 03/13/2015 with benign pathology  (9) anastrozole started  08/29/2014  (a) bone density 12/13/2015 shows improvement, with the lumbar spine T score now at 0.1, the femoral neck -1.1, both increased   PLAN: Erin [name] is now 3 years out from her skin recurrence, with no evidence of active disease.  This is very favorable.  She is on anastrozole with good tolerance and the plan is to continue that for a total of 7 years.  She will be repeating a bone density next August.  Depending on that we will decide whether or not to continue the [facility] but she will receive a dose next May.  Showed her the image of the MRI with the lesion in the pelvis.  It has been stable, with no evidence of inflammation or invasion, absolutely no change, and the more we check it the more convincing it is that we are dealing with something benign.  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  She will see me again in May 2019.  She will have her next dose of Prolia at that time.  I will set her up for her next bone density scan and I will see her again a year from now with another pelvic MRI  She knows to call for any problems that may develop before that visit   Jasmain Ahlberg, [name], MD   03/25/17 3:53 PM Medical Oncology and Hematology [facility] [place], [place] [address] Tel. [phone]    Fax. [phone]    This document serves as a record of services personally performed by Lurline Del, MD. It was created on his behalf by Sheron Nightingale, a trained medical scribe. The creation of this record is based on the scribe's personal observations and the provider's statements to them.   I have reviewed the above documentation for accuracy and completeness, and I agree with the above.

## 2017-03-25 ENCOUNTER — Ambulatory Visit (HOSPITAL_BASED_OUTPATIENT_CLINIC_OR_DEPARTMENT_OTHER): Payer: BC Managed Care – PPO | Admitting: Oncology

## 2017-03-25 ENCOUNTER — Telehealth: Payer: Self-pay | Admitting: Oncology

## 2017-03-25 VITALS — BP 134/73 | HR 74 | Temp 98.0°F | Resp 18 | Ht 64.75 in | Wt 161.7 lb

## 2017-03-25 DIAGNOSIS — M8580 Other specified disorders of bone density and structure, unspecified site: Secondary | ICD-10-CM

## 2017-03-25 DIAGNOSIS — Z170 Estrogen receptor positive status [ER+]: Secondary | ICD-10-CM | POA: Diagnosis not present

## 2017-03-25 DIAGNOSIS — C50412 Malignant neoplasm of upper-outer quadrant of left female breast: Secondary | ICD-10-CM

## 2017-03-25 DIAGNOSIS — Z79811 Long term (current) use of aromatase inhibitors: Secondary | ICD-10-CM | POA: Diagnosis not present

## 2017-03-25 DIAGNOSIS — C50912 Malignant neoplasm of unspecified site of left female breast: Secondary | ICD-10-CM | POA: Diagnosis not present

## 2017-03-25 NOTE — Telephone Encounter (Signed)
Gave patient AVS and calendar of upcoming May appointments.  °

## 2017-03-26 ENCOUNTER — Other Ambulatory Visit: Payer: Self-pay | Admitting: Oncology

## 2017-03-26 NOTE — Progress Notes (Unsigned)
The prognostic panel from her 06/01/2014 surgery showed HER-2 negativity, ER 95% positive, PR 66% positive and proliferation marker 5%

## 2017-04-02 ENCOUNTER — Telehealth: Payer: Self-pay | Admitting: Internal Medicine

## 2017-04-02 NOTE — Telephone Encounter (Signed)
Copied from [place]ie. Topic: Inquiry >> Apr 02, 2017 11:09 AM Boyd Kerbs wrote: Reason for CRM: is coming in for Physical on the 27th and wanted to see if order for labs were in.  She wants to go next week to have labs done,  She also wanted to see if not on labs if could have vit. D check.  Please send message in mychart when orders in and you can go and have done.

## 2017-04-03 NOTE — Telephone Encounter (Signed)
Labs were already in, she can get anytime.

## 2017-04-03 NOTE — Telephone Encounter (Signed)
LVM informing patient.

## 2017-04-10 ENCOUNTER — Other Ambulatory Visit (INDEPENDENT_AMBULATORY_CARE_PROVIDER_SITE_OTHER): Payer: BC Managed Care – PPO

## 2017-04-10 DIAGNOSIS — Z0000 Encounter for general adult medical examination without abnormal findings: Secondary | ICD-10-CM | POA: Diagnosis not present

## 2017-04-10 LAB — LIPID PANEL
CHOL/HDL RATIO: 2
CHOLESTEROL: 133 mg/dL (ref 0–200)
HDL: 59.5 mg/dL (ref >39.00)
LDL CALC: 60 mg/dL (ref 0–99)
NonHDL: 73.99
Triglycerides: 70 mg/dL (ref 0.0–149.0)
VLDL: 14 mg/dL (ref 0.0–40.0)

## 2017-04-10 LAB — COMPREHENSIVE METABOLIC PANEL
ALBUMIN: 4.1 g/dL (ref 3.5–5.2)
ALT: 11 U/L (ref 0–35)
AST: 16 U/L (ref 0–37)
Alkaline Phosphatase: 56 U/L (ref 39–117)
BILIRUBIN TOTAL: 1.2 mg/dL (ref 0.2–1.2)
BUN: 22 mg/dL (ref 6–23)
CALCIUM: 9.1 mg/dL (ref 8.4–10.5)
CHLORIDE: 103 meq/L (ref 96–112)
CO2: 28 mEq/L (ref 19–32)
CREATININE: 0.87 mg/dL (ref 0.40–1.20)
GFR: 71.75 mL/min (ref >60.00)
Glucose, Bld: 78 mg/dL (ref 70–99)
Potassium: 4.7 mEq/L (ref 3.5–5.1)
SODIUM: 137 meq/L (ref 135–145)
Total Protein: 6.9 g/dL (ref 6.0–8.3)

## 2017-04-10 LAB — CBC
HCT: 45.1 % (ref 36.0–46.0)
Hemoglobin: 15.4 g/dL — ABNORMAL HIGH (ref 12.0–15.0)
MCHC: 34.1 g/dL (ref 30.0–36.0)
MCV: 87.8 fl (ref 78.0–100.0)
PLATELETS: 219 10*3/uL (ref 150.0–400.0)
RBC: 5.13 Mil/uL — ABNORMAL HIGH (ref 3.87–5.11)
RDW: 12.8 % (ref 11.5–15.5)
WBC: 4.8 10*3/uL (ref 4.0–10.5)

## 2017-04-10 LAB — VITAMIN B12: VITAMIN B 12: 348 pg/mL (ref 211–911)

## 2017-04-10 LAB — VITAMIN D 25 HYDROXY (VIT D DEFICIENCY, FRACTURES): VITD: 35.57 ng/mL (ref 30.00–100.00)

## 2017-04-17 ENCOUNTER — Encounter: Payer: Self-pay | Admitting: Internal Medicine

## 2017-04-17 ENCOUNTER — Ambulatory Visit (INDEPENDENT_AMBULATORY_CARE_PROVIDER_SITE_OTHER): Payer: BC Managed Care – PPO | Admitting: Internal Medicine

## 2017-04-17 VITALS — BP 118/70 | HR 63 | Temp 98.3°F | Resp 20 | Ht 65.0 in | Wt 163.0 lb

## 2017-04-17 DIAGNOSIS — M8580 Other specified disorders of bone density and structure, unspecified site: Secondary | ICD-10-CM | POA: Diagnosis not present

## 2017-04-17 DIAGNOSIS — E039 Hypothyroidism, unspecified: Secondary | ICD-10-CM

## 2017-04-17 DIAGNOSIS — I10 Essential (primary) hypertension: Secondary | ICD-10-CM

## 2017-04-17 DIAGNOSIS — Z0000 Encounter for general adult medical examination without abnormal findings: Secondary | ICD-10-CM | POA: Diagnosis not present

## 2017-04-17 NOTE — Progress Notes (Signed)
   Subjective:    Patient ID: Erin [name], female    DOB: [birth year], 56 y.o.   MRN: [id number]  HPI The patient is a 56 YO female coming in for physical.  PMH, [facility], social history reviewed and updated.   Review of Systems  Constitutional: Negative.   HENT: Negative.   Eyes: Negative.   Respiratory: Negative for cough, chest tightness and shortness of breath.   Cardiovascular: Negative for chest pain, palpitations and leg swelling.  Gastrointestinal: Negative for abdominal distention, abdominal pain, constipation, diarrhea, nausea and vomiting.  Musculoskeletal: Negative.   Skin: Negative.   Neurological: Negative.   Psychiatric/Behavioral: Negative.       Objective:   Physical Exam  Constitutional: She is oriented to person, place, and time. She appears well-developed and well-nourished.  HENT:  Head: Normocephalic and atraumatic.  Eyes: EOM are normal.  Neck: Normal range of motion.  Cardiovascular: Normal rate and regular rhythm.  Pulmonary/Chest: Effort normal and breath sounds normal. No respiratory distress. She has no wheezes. She has no rales.  Abdominal: Soft. Bowel sounds are normal. She exhibits no distension. There is no tenderness. There is no rebound.  Musculoskeletal: She exhibits no edema.  Neurological: She is alert and oriented to person, place, and time. Coordination normal.  Skin: Skin is warm and dry.  Psychiatric: She has a normal mood and affect.   Vitals:   04/17/17 0943  BP: 118/70  Pulse: 63  Resp: 20  Temp: 98.3 F (36.8 C)  SpO2: 99%  Weight: 163 lb (73.9 kg)  Height: 5\' 5"  (1.651 m)      Assessment & Plan:

## 2017-04-17 NOTE — Assessment & Plan Note (Signed)
Taking synthroid 75 mcg daily and check TSH and T4 in 6 months.

## 2017-04-17 NOTE — Assessment & Plan Note (Signed)
Added to shingrix waiting list, flu and tetanus up to date. Counseled on possible cologuard now (colon 5 years ago with fair prep and recommended 5 year follow up) and she will look into this and let us know. Counseled about sun safety and mole surveillance. Given screening recommendations.

## 2017-04-17 NOTE — Patient Instructions (Signed)

## 2017-04-17 NOTE — Assessment & Plan Note (Signed)
Taking prolia, ordered bone density for this fall for follow up.

## 2017-04-17 NOTE — Assessment & Plan Note (Signed)
BP at goal on losartan and recent CMP without indication for change.

## 2017-05-11 NOTE — Progress Notes (Signed)
[name] [address] [place], [place] [address] Phone: [phone] Subjective:     CC: Left wrist pain bilateral foot pain  XVQ:[id number]  Erin [name] is a 57 y.o. female coming in with complaint of left wrist pain.  Found previously to have a de Quervain's tenosynovitis.  Has been 4 months.  Did do very well with the injection but now over the course last several weeks started having worsening discomfort again.  Patient feels like she never got the full strength back.  Now having more discomfort even with daily activities.  Patient states maybe even some more swelling.  Wants to know if there is anything else she can possibly do.   Bilateral foot pain.  Patient has had foot pain for quite some time.  Has seen other providers and does have some custom orthotics.  Patient wants to be running but finds it difficult.  Has had multiple different types issues over the course of time with minimal benefit.  Continues to have discomfort and pain no.    Past Medical History:  Diagnosis Date  . Allergic rhinitis due to pollen   . Breast cancer ([place]) 2010  . Breast cancer ([place]) 2016   reoccurrence in scar  . Hypertension   . Hypothyroidism   . Osteopenia   . S/P radiation therapy 07/12/2014 through 08/16/2014    Left chest wall 4000 cGy in 20 sessions, left chest wall boost 1000 cGy in 5 sessions  . Wears glasses    Past Surgical History:  Procedure Laterality Date  . BREAST LUMPECTOMY Left 06/01/2014   Procedure: WIDE LOCAL EXCISION OF RECURRENT LEFT BREAST CANCER;  Surgeon: Rolm Bookbinder, MD;  Location: [facility];  Service: General;  Laterality: Left;  . COLONOSCOPY    . cyst removed     left lower jaw - at age 64 yr  . DILATATION & CURRETTAGE/HYSTEROSCOPY WITH RESECTOCOPE N/A 09/16/2013   Procedure: DILATATION & CURETTAGE/HYSTEROSCOPY WITH RESECTOCOPE;  Surgeon: Princess Bruins, MD;  Location: [facility];  Service: Gynecology;  Laterality: N/A;  1 hr.  . INGUINAL HERNIA REPAIR     right side as infant  . LAPAROSCOPIC PELVIC LYMPH NODE BIOPSY    . MASTECTOMY  2010   bilateral mastectomies-lt snbx-radiation post  . MASTECTOMY Bilateral   . OOPHORECTOMY  2016   patient denies  . Right knee arthroscopy  2008  . ROBOTIC ASSISTED BILATERAL SALPINGO OOPHERECTOMY Bilateral 03/13/2015   Procedure: ROBOTIC ASSISTED BILATERAL SALPINGO OOPHORECTOMY With Washings;  Surgeon: Princess Bruins, MD;  Location: [facility];  Service: Gynecology;  Laterality: Bilateral;  . TONSILLECTOMY    . WISDOM TOOTH EXTRACTION     Social History   Socioeconomic History  . Marital status: Single    Spouse name: Not on file  . Number of children: 0  . Years of education: PhD  . [employer] education level: Not on file  Social Needs  . Financial resource strain: Not on file  . Food insecurity - worry: Not on file  . Food insecurity - inability: Not on file  . Transportation needs - medical: Not on file  . Transportation needs - non-medical: Not on file  Occupational History  . Not on file  Tobacco Use  . Smoking status: Never Smoker  . Smokeless tobacco: Never Used  Substance and Sexual Activity  . Alcohol use: No    Alcohol/week: 0.0 oz    Frequency: Never    Comment: rare  . Drug  use: No  . Sexual activity: Not Currently    Birth control/protection: None    Comment: did not answer high risk questions  Other Topics Concern  . Not on file  Social History Narrative   She grew up in [place] last 16 yrs in [place]   Rare caffeine use    Allergies  Allergen Reactions  . Pollen Extract   . Adhesive [Tape] Rash   Family History  Problem Relation Age of Onset  . Hypertension Mother   . Cancer Mother        Lung Cancer - long history of tobacco use  . Hypertension Father   . Stroke Maternal Grandfather   . Stroke Paternal Grandfather   . Hypertension Brother   .  Colon cancer Neg Hx   . Stomach cancer Neg Hx      Past medical history, social, surgical and family history all reviewed in electronic medical record.  No pertanent information unless stated regarding to the chief complaint.   Review of Systems:Review of systems updated and as accurate as of 05/11/17  No headache, visual changes, nausea, vomiting, diarrhea, constipation, dizziness, abdominal pain, skin rash, fevers, chills, night sweats, weight loss, swollen lymph nodes, body aches, joint swelling,  chest pain, shortness of breath, mood changes.  Positive muscle aches  Objective  There were no vitals taken for this visit. Systems examined below as of 05/11/17   General: No apparent distress alert and oriented x3 mood and affect normal, dressed appropriately.  HEENT: Pupils equal, extraocular movements intact  Respiratory: Patient's speak in full sentences and does not appear short of breath  Cardiovascular: No lower extremity edema, non tender, no erythema  Skin: Warm dry intact with no signs of infection or rash on extremities or on axial skeleton.  Abdomen: Soft nontender  Neuro: Cranial nerves II through XII are intact, neurovascularly intact in all extremities with 2+ DTRs and 2+ pulses.  Lymph: No lymphadenopathy of posterior or anterior cervical chain or axillae bilaterally.  Gait normal with good balance and coordination.  MSK:  Non tender with full range of motion and good stability and symmetric strength and tone of shoulders, elbows,  hip, knee and ankles bilaterally.  Wrist: Left Inspection normal with no visible erythema or swelling. ROM smooth and normal with good flexion and extension and ulnar/radial deviation that is symmetrical with opposite wrist. Palpation is normal over metacarpals, navicular, lunate, and TFCC; tendons without tenderness/ swelling No snuffbox tenderness. No tenderness over Canal of Guyon. Strength 5/5 in all directions without pain. Positive  Finkelstein, negative Tinel's and phalens. Negative Watson's test. Contralateral wrist unremarkable  Foot exam shows the patient does have pes cavus bilaterally.  Mild overpronation of the hindfoot on the right side.  Breakdown of the transverse arch right greater than left with bunion and bunionette formation.  MSK US performed of: Left wrist This study was ordered, performed, and interpreted by Charlann Boxer D.O.  Wrist: All extensor compartments visualized and tendons hypoechoic changes noted around the abductor pollicis longus tendon sheath but no true tear is noted.  IMPRESSION: Recurrent de Quervain's tenosynovitis    Impression and Recommendations:     This case required medical decision making of moderate complexity.      Note: This dictation was prepared with Dragon dictation along with smaller phrase technology. Any transcriptional errors that result from this process are unintentional.

## 2017-05-12 ENCOUNTER — Ambulatory Visit: Payer: BC Managed Care – PPO | Admitting: Family Medicine

## 2017-05-12 ENCOUNTER — Ambulatory Visit: Payer: Self-pay

## 2017-05-12 ENCOUNTER — Encounter: Payer: Self-pay | Admitting: Family Medicine

## 2017-05-12 VITALS — BP 120/70 | HR 80 | Ht 65.0 in | Wt 162.0 lb

## 2017-05-12 DIAGNOSIS — Q667 Congenital pes cavus, unspecified foot: Secondary | ICD-10-CM | POA: Insufficient documentation

## 2017-05-12 DIAGNOSIS — M25532 Pain in left wrist: Secondary | ICD-10-CM

## 2017-05-12 DIAGNOSIS — M654 Radial styloid tenosynovitis [de Quervain]: Secondary | ICD-10-CM | POA: Diagnosis not present

## 2017-05-12 NOTE — Assessment & Plan Note (Signed)
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  We discussed with patient about icing regimen, home exercise, which activities to do which wants to avoid.  We discussed proper shoes and over-the-counter orthotics.  Patient will follow up with me again in 4 weeks

## 2017-05-12 NOTE — Assessment & Plan Note (Signed)
Worsening symptoms.  Discussed with patient in great length.  Patient will be sent to formal physical therapy.  Declined any type of injection.  We did discuss the possibility of nitroglycerin but patient was wary secondary to her history of cancer.  We discussed icing regimen, home exercise, which activities doing which wants to avoid.  Follow-up with me again in 4-6 weeks

## 2017-05-12 NOTE — Patient Instructions (Signed)
Good to see you  PT will be calling you  Ice is your friend Go back to the brace day and night for 1 week then nightly for another 2 weeks.  Avoid heavy lifting.  Ice 20 minutes 2 times daily. Usually after activity and before bed. For the foot [employer] orthotics "total support" online would be great  Avoid being barefoot For running shoes look into newtons and see what you think  See me again in 4 -6 weeks

## 2017-06-09 ENCOUNTER — Ambulatory Visit: Payer: BC Managed Care – PPO | Admitting: Internal Medicine

## 2017-06-17 ENCOUNTER — Ambulatory Visit: Payer: BC Managed Care – PPO | Admitting: Family Medicine

## 2017-06-22 NOTE — Progress Notes (Signed)
[name] [name], [place] [address] Phone: [phone] Subjective:      CC: Hand pain follow-up, foot pain follow-up  [id number]  Erin [name] is a 57 y.o. female coming in with complaint of left wrist pain.  Recurrent tenosynovitis previously.  Also started with braces.  And was to go to physical therapy.  Patient states that she has had some dry needling performed on her arm. She also had Graston performed on her arm. Patient has had a decrease in pain with physical therapy. She was using the brace at night.   Patient was also having some foot pain.  Was to get over-the-counter orthotics and avoid being barefoot.  Patient states that she got 2 sets of orthotics that she has tried to use. One of them is larger than the other     Past Medical History:  Diagnosis Date  . Allergic rhinitis due to pollen   . Breast cancer ([place]) 2010  . Breast cancer ([facility] [place]) 2016   reoccurrence in scar  . Hypertension   . Hypothyroidism   . Osteopenia   . S/P radiation therapy 07/12/2014 through 08/16/2014    Left chest wall 4000 cGy in 20 sessions, left chest wall boost 1000 cGy in 5 sessions  . Wears glasses    Past Surgical History:  Procedure Laterality Date  . BREAST LUMPECTOMY Left 06/01/2014   Procedure: WIDE LOCAL EXCISION OF RECURRENT LEFT BREAST CANCER;  Surgeon: Rolm Bookbinder, MD;  Location: [facility];  Service: General;  Laterality: Left;  . COLONOSCOPY    . cyst removed     left lower jaw - at age 62 yr  . DILATATION & CURRETTAGE/HYSTEROSCOPY WITH RESECTOCOPE N/A 09/16/2013   Procedure: DILATATION & CURETTAGE/HYSTEROSCOPY WITH RESECTOCOPE;  Surgeon: Princess Bruins, MD;  Location: [facility];  Service: Gynecology;  Laterality: N/A;  1 hr.  . INGUINAL HERNIA REPAIR     right side as infant  . LAPAROSCOPIC PELVIC LYMPH NODE BIOPSY    . MASTECTOMY  2010   bilateral  mastectomies-lt snbx-radiation post  . MASTECTOMY Bilateral   . OOPHORECTOMY  2016   patient denies  . Right knee arthroscopy  2008  . ROBOTIC ASSISTED BILATERAL SALPINGO OOPHERECTOMY Bilateral 03/13/2015   Procedure: ROBOTIC ASSISTED BILATERAL SALPINGO OOPHORECTOMY With Washings;  Surgeon: Princess Bruins, MD;  Location: [facility];  Service: Gynecology;  Laterality: Bilateral;  . TONSILLECTOMY    . WISDOM TOOTH EXTRACTION     Social History   Socioeconomic History  . Marital status: Single    Spouse name: None  . Number of children: 0  . Years of education: PhD  . Highest education level: None  Social Needs  . Financial resource strain: None  . Food insecurity - worry: None  . Food insecurity - inability: None  . Transportation needs - medical: None  . Transportation needs - non-medical: None  Occupational History  . None  Tobacco Use  . Smoking status: Never Smoker  . Smokeless tobacco: Never Used  Substance and Sexual Activity  . Alcohol use: No    Alcohol/week: 0.0 oz    Frequency: Never    Comment: rare  . Drug use: No  . Sexual activity: Not Currently    Birth control/protection: None    Comment: did not answer high risk questions  Other Topics Concern  . None  Social History Narrative   She grew up in [place] last 16 yrs in [place]  Rare caffeine use    Allergies  Allergen Reactions  . Pollen Extract   . Adhesive [Tape] Rash   Family History  Problem Relation Age of Onset  . Hypertension Mother   . Cancer Mother        Lung Cancer - long history of tobacco use  . Hypertension Father   . Stroke Maternal Grandfather   . Stroke Paternal Grandfather   . Hypertension Brother   . Colon cancer Neg Hx   . Stomach cancer Neg Hx      Past medical history, social, surgical and family history all reviewed in electronic medical record.  No pertanent information unless stated regarding to the chief complaint.   Review of Systems:Review of systems  updated and as accurate as of 06/23/17  No headache, visual changes, nausea, vomiting, diarrhea, constipation, dizziness, abdominal pain, skin rash, fevers, chills, night sweats, weight loss, swollen lymph nodes, body aches, joint swelling, chest pain, shortness of breath, mood changes.  Positive muscle aches  Objective  Blood pressure 130/86, pulse 66, height 5\' 5"  (1.651 m), weight 160 lb (72.6 kg), SpO2 98 %. Systems examined below as of 06/23/17   General: No apparent distress alert and oriented x3 mood and affect normal, dressed appropriately.  HEENT: Pupils equal, extraocular movements intact  Respiratory: Patient's speak in full sentences and does not appear short of breath  Cardiovascular: No lower extremity edema, non tender, no erythema  Skin: Warm dry intact with no signs of infection or rash on extremities or on axial skeleton.  Abdomen: Soft nontender  Neuro: Cranial nerves II through XII are intact, neurovascularly intact in all extremities with 2+ DTRs and 2+ pulses.  Lymph: No lymphadenopathy of posterior or anterior cervical chain or axillae bilaterally.  Gait normal with good balance and coordination.  MSK:  Non tender with full range of motion and good stability and symmetric strength and tone of shoulders, elbows, hip, knee and ankles bilaterally.  Wrist: Left Inspection normal with no visible erythema or swelling. ROM smooth and normal with good flexion and extension and ulnar/radial deviation that is symmetrical with opposite wrist. Palpation is normal over metacarpals, navicular, lunate, and TFCC; tendons without tenderness/ swelling No snuffbox tenderness. No tenderness over Canal of Guyon. Strength 5/5 in all directions without pain. Mild positive Finkelstein, negative Tinel's and phalens. Negative Watson's test. Contralateral wrist unremarkable     Impression and Recommendations:     This case required medical decision making of moderate complexity.       Note: This dictation was prepared with Dragon dictation along with smaller phrase technology. Any transcriptional errors that result from this process are unintentional.

## 2017-06-23 ENCOUNTER — Encounter: Payer: Self-pay | Admitting: Family Medicine

## 2017-06-23 ENCOUNTER — Ambulatory Visit: Payer: BC Managed Care – PPO | Admitting: Family Medicine

## 2017-06-23 DIAGNOSIS — M654 Radial styloid tenosynovitis [de Quervain]: Secondary | ICD-10-CM

## 2017-06-23 NOTE — Patient Instructions (Signed)
Good to see you  I think you are doing great  Ice is your friend.  Wear it at night and maybe then with working out at night Find a thumb spica brace at night Read about PRP  Finish up with PT in the next couple weeks See me again in 6-8 weeks

## 2017-06-23 NOTE — Assessment & Plan Note (Signed)
Patient is making progress with conservative therapy.  85% better at this time.  Discussed with decreasing the wear of the brace when she can.  Discussed the topical anti-inflammatories.  We discussed the possibility of nitroglycerin or PRP injections if continuing to have difficulty with healing.  Patient will finish up with physical therapy and see me again in 6-8 weeks

## 2017-06-30 NOTE — Progress Notes (Signed)
This encounter was created in error - please disregard.

## 2017-07-03 ENCOUNTER — Ambulatory Visit: Payer: BC Managed Care – PPO | Admitting: Internal Medicine

## 2017-07-03 ENCOUNTER — Encounter: Payer: Self-pay | Admitting: Internal Medicine

## 2017-07-03 DIAGNOSIS — R059 Cough, unspecified: Secondary | ICD-10-CM | POA: Insufficient documentation

## 2017-07-03 DIAGNOSIS — R05 Cough: Secondary | ICD-10-CM | POA: Diagnosis not present

## 2017-07-03 MED ORDER — AZITHROMYCIN 250 MG PO TABS: ORAL_TABLET | ORAL | 0 refills | Status: DC

## 2017-07-03 NOTE — Patient Instructions (Signed)
We have sent in the antibiotic to take. Take 2 pills today, then starting tomorrow take 1 pill daily until it's gone.

## 2017-07-03 NOTE — Progress Notes (Signed)
   Subjective:    Patient ID: Erin [name], female    DOB: Sep 21, 1960, 57 y.o.   MRN: [id number]  HPI The patient is a 57 YO female coming in for 8 days of sinus pressure and cold symptoms. Some fevers at the onset. Denies chills. Denies SOB. Some cough although worsening in the last 1 day. Mild production. Sinus drainage yellow to green to tan. Mild blood in the drainage in the last 2 days. She denies ear pain or drainage. Minimal headaches. No body aches. Denies known sick contact. Taking claritin the last week and advil cold and flu as needed which helps some. Overall worsening.   Review of Systems  Constitutional: Positive for activity change and appetite change. Negative for chills, fatigue, fever and unexpected weight change.  HENT: Positive for congestion, postnasal drip, rhinorrhea and sinus pressure. Negative for ear discharge, ear pain, sinus pain, sneezing, sore throat, tinnitus, trouble swallowing and voice change.   Eyes: Negative.   Respiratory: Positive for cough. Negative for chest tightness, shortness of breath and wheezing.   Cardiovascular: Negative.   Gastrointestinal: Negative.   Neurological: Negative.       Objective:   Physical Exam  Constitutional: She is oriented to person, place, and time. She appears well-developed and well-nourished.  HENT:  Head: Normocephalic and atraumatic.  Oropharynx with redness and clear drainage, nose with swollen turbinates, TMs normal bilaterally  Eyes: EOM are normal.  Neck: Normal range of motion. No thyromegaly present.  Cardiovascular: Normal rate and regular rhythm.  Pulmonary/Chest: Effort normal and breath sounds normal. No respiratory distress. She has no wheezes. She has no rales.  Abdominal: Soft.  Lymphadenopathy:    She has no cervical adenopathy.  Neurological: She is alert and oriented to person, place, and time.  Skin: Skin is warm and dry.   Vitals:   07/03/17 0918  BP: 130/90  Pulse: (!) 111  Temp: 98.3 F  (36.8 C)  TempSrc: Oral  SpO2: 96%  Weight: 164 lb (74.4 kg)  Height: 5\' 5"  (1.651 m)      Assessment & Plan:

## 2017-07-03 NOTE — Assessment & Plan Note (Signed)
Rx for azithromycin for 5 days. Given worsening and time course will treat. Avoid steroids. Continue claritin and cold meds prn.

## 2017-07-11 ENCOUNTER — Ambulatory Visit (INDEPENDENT_AMBULATORY_CARE_PROVIDER_SITE_OTHER): Payer: BC Managed Care – PPO | Admitting: Internal Medicine

## 2017-07-11 ENCOUNTER — Encounter: Payer: Self-pay | Admitting: Internal Medicine

## 2017-07-11 VITALS — BP 116/78 | HR 63 | Ht 65.0 in | Wt 164.0 lb

## 2017-07-11 DIAGNOSIS — Z1211 Encounter for screening for malignant neoplasm of colon: Secondary | ICD-10-CM

## 2017-07-11 NOTE — Progress Notes (Signed)
Patient ID: Erin [name], female   DOB: 10/07/1960, 57 y.o.   MRN: [id number] HPI: Breta Demedeiros is a 57 year old female with a history of breast cancer status post bilateral mastectomy in 2010, recurrence in mastectomy scar in 2016, status post bilateral oophorectomy and now on anastrozole therapy, who is seen in consult at the request of Dr. Sharlet Salina to discuss repeat colorectal cancer screening.  She is here alone today.  I performed her screening colonoscopy 5 years ago in December 2013.  This revealed scattered diverticulosis in the ascending and sigmoid colon.  The preparation was fair despite copious irrigation and lavage.  No polyps were seen.  She reports that from a GI perspective she has been doing well.  No change in bowel habits.  She will develop mild constipation at times when she is traveling but otherwise bowel habits are regular.  No diarrhea.  No blood in her stool or melena.  No abdominal pain.  No family history of colon cancer.  No upper GI or hepatobiliary complaints.  She has done considerable research and has questions about Cologuard versus repeat colonoscopy versus other colorectal cancer screening test given the fact that her preparation was fair in 2013.  Past Medical History:  Diagnosis Date  . Allergic rhinitis due to pollen   . Breast cancer ([place]) 2010  . Breast cancer ([place]) 2016   reoccurrence in scar  . Hypertension   . Hypothyroidism   . Osteopenia   . S/P radiation therapy 07/12/2014 through 08/16/2014    Left chest wall 4000 cGy in 20 sessions, left chest wall boost 1000 cGy in 5 sessions  . Wears glasses     Past Surgical History:  Procedure Laterality Date  . BREAST LUMPECTOMY Left 06/01/2014   Procedure: WIDE LOCAL EXCISION OF RECURRENT LEFT BREAST CANCER;  Surgeon: Rolm Bookbinder, MD;  Location: [facility];  Service: General;  Laterality: Left;  . COLONOSCOPY    . cyst  removed     left lower jaw - at age 2 yr  . DILATATION & CURRETTAGE/HYSTEROSCOPY WITH RESECTOCOPE N/A 09/16/2013   Procedure: DILATATION & CURETTAGE/HYSTEROSCOPY WITH RESECTOCOPE;  Surgeon: Princess Bruins, MD;  Location: [facility];  Service: Gynecology;  Laterality: N/A;  1 hr.  . INGUINAL HERNIA REPAIR     right side as infant  . LAPAROSCOPIC PELVIC LYMPH NODE BIOPSY    . MASTECTOMY  2010   bilateral mastectomies-lt snbx-radiation post  . MASTECTOMY Bilateral   . OOPHORECTOMY  2016   patient denies  . Right knee arthroscopy  2008  . ROBOTIC ASSISTED BILATERAL SALPINGO OOPHERECTOMY Bilateral 03/13/2015   Procedure: ROBOTIC ASSISTED BILATERAL SALPINGO OOPHORECTOMY With Washings;  Surgeon: Princess Bruins, MD;  Location: [facility];  Service: Gynecology;  Laterality: Bilateral;  . TONSILLECTOMY    . WISDOM TOOTH EXTRACTION      Outpatient Medications Prior to Visit  Medication Sig Dispense Refill  . acetaminophen (TYLENOL) 500 MG tablet Take 500 mg by mouth every 6 (six) hours as needed for moderate pain or headache.    Marland Kitchen acyclovir ointment (ZOVIRAX) 5 % Apply 1 application topically every 3 (three) hours. As needed 5 g 5  . anastrozole (ARIMIDEX) 1 MG tablet Take 1 tablet (1 mg total) by mouth daily. 90 tablet 4  . Cholecalciferol (VITAMIN D3) 2000 UNITS capsule Take 2,000 Units by mouth daily.      Marland Kitchen denosumab (PROLIA) 60 MG/ML SOLN injection Inject 60 mg into the skin every 6 (six) months. Administer in upper  arm, thigh, or abdomen    . ibuprofen (ADVIL,MOTRIN) 200 MG tablet Take 200 mg by mouth every 6 (six) hours as needed for moderate pain.    Marland Kitchen levothyroxine (SYNTHROID, LEVOTHROID) 75 MCG tablet TAKE 1 TABLET(75 MCG) BY MOUTH DAILY BEFORE BREAKFAST 90 tablet 1  . loratadine (CLARITIN) 10 MG tablet Take 10 mg by mouth daily as needed for allergies.     Marland Kitchen losartan (COZAAR) 100 MG tablet TAKE 1 TABLET(100 MG) BY MOUTH DAILY 90 tablet 3  . Polyethyl Glycol-Propyl Glycol (SYSTANE OP) Apply  1 drop to eye as needed (for allergies).    . sodium chloride (OCEAN) 0.65 % SOLN nasal spray Place 1 spray into both nostrils as needed for congestion.    . Turmeric 500 MG CAPS Take 500 mg by mouth 2 (two) times daily.    Marland Kitchen azithromycin (ZITHROMAX) 250 MG tablet Day 1 take 2 pills, days 2-5 take 1 pill. 6 tablet 0   No facility-administered medications prior to visit.     Allergies  Allergen Reactions  . Pollen Extract   . Adhesive [Tape] Rash    Family History  Problem Relation Age of Onset  . Hypertension Mother   . Cancer Mother        Lung Cancer - long history of tobacco use  . Hypertension Father   . Stroke Maternal Grandfather   . Stroke Paternal Grandfather   . Hypertension Brother   . Colon cancer Neg Hx   . Stomach cancer Neg Hx     Social History   Tobacco Use  . Smoking status: Never Smoker  . Smokeless tobacco: Never Used  Substance Use Topics  . Alcohol use: No    Alcohol/week: 0.0 oz    Frequency: Never    Comment: rare  . Drug use: No    ROS: As per history of present illness, otherwise negative  BP 116/78   Pulse 63   Ht 5\' 5"  (1.651 m)   Wt 164 lb (74.4 kg)   BMI 27.29 kg/m  Constitutional: Well-developed and well-nourished. No distress. HEENT: Normocephalic and atraumatic.  Conjunctivae are normal.  No scleral icterus. Neurological: Alert and oriented to person place and time. Skin: Skin is warm and dry.  Psychiatric: Normal mood and affect. Behavior is normal.  RELEVANT LABS AND IMAGING: CBC    Component Value Date/Time   WBC 4.8 04/10/2017 0909   RBC 5.13 (H) 04/10/2017 0909   HGB 15.4 (H) 04/10/2017 0909   HGB 14.6 02/28/2017 1521   HCT 45.1 04/10/2017 0909   HCT 42.6 02/28/2017 1521   PLT 219.0 04/10/2017 0909   PLT 197 02/28/2017 1521   MCV 87.8 04/10/2017 0909   MCV 87.3 02/28/2017 1521   MCH 29.9 02/28/2017 1521   MCH 27.9 05/24/2014 0850   MCHC 34.1 04/10/2017 0909   RDW 12.8 04/10/2017 0909   RDW 12.5 02/28/2017  1521   LYMPHSABS 1.6 02/28/2017 1521   MONOABS 0.6 02/28/2017 1521   EOSABS 0.1 02/28/2017 1521   BASOSABS 0.0 02/28/2017 1521    CMP     Component Value Date/Time   NA 137 04/10/2017 0909   NA 139 02/28/2017 1521   K 4.7 04/10/2017 0909   K 4.6 02/28/2017 1521   CL 103 04/10/2017 0909   CL 105 08/10/2012 0944   CO2 28 04/10/2017 0909   CO2 29 02/28/2017 1521   GLUCOSE 78 04/10/2017 0909   GLUCOSE 90 02/28/2017 1521   GLUCOSE 89 08/10/2012 0944  BUN 22 04/10/2017 0909   BUN 20.0 02/28/2017 1521   CREATININE 0.87 04/10/2017 0909   CREATININE 1.0 02/28/2017 1521   CALCIUM 9.1 04/10/2017 0909   CALCIUM 10.4 02/28/2017 1521   PROT 6.9 04/10/2017 0909   PROT 6.8 02/28/2017 1521   ALBUMIN 4.1 04/10/2017 0909   ALBUMIN 3.9 02/28/2017 1521   AST 16 04/10/2017 0909   AST 18 02/28/2017 1521   ALT 11 04/10/2017 0909   ALT 13 02/28/2017 1521   ALKPHOS 56 04/10/2017 0909   ALKPHOS 62 02/28/2017 1521   BILITOT 1.2 04/10/2017 0909   BILITOT 1.17 02/28/2017 1521   GFRNONAA 75 (L) 08/23/2013 0955   GFRAA 87 (L) 08/23/2013 0955    ASSESSMENT/PLAN: 57 year old female with a history of breast cancer status post bilateral mastectomy in 2010, recurrence in mastectomy scar in 2016, status post bilateral oophorectomy and now on anastrozole therapy, who is seen in consult at the request of Dr. Sharlet Salina to discuss repeat colorectal cancer screening.  1. CRC screening --we had a long discussion this afternoon regarding colon cancer screening and options for her given the fact that her preparation in 2013 was fair.  I do think it is reasonable to consider repeat colonoscopy given the prior fair preparation or other screening modalities in the interim.  She would prefer less invasive approach and we discussed Cologuard and FIT testing.  We discussed the risks and benefits to both including the sensitivity and specificity of these tests.  She would prefer to do more research and will notify me as to  which she would prefer to proceed with.  If she chooses FIT, I recommend this be performed annually until 2023 when I would recommend repeat colonoscopy with 2-day prep.  If she chooses Cologuard, this would be done every 3 years until which point we choose to repeat colonoscopy.  After our discussion she was happy with her options and will call back with her decision.    NX:[id number], [address], [place], [place] [id number]

## 2017-07-11 NOTE — Patient Instructions (Addendum)
fecal immunochemical test (FIT) VS Cologuard.  Normal BMI (Body Mass Index- based on height and weight) is between 19 and 25. Your BMI today is Body mass index is 27.29 kg/m. Marland Kitchen Please consider follow up  regarding your BMI with your Primary Care Provider.  Please follow up as needed.

## 2017-07-15 ENCOUNTER — Telehealth: Payer: Self-pay

## 2017-07-15 NOTE — Telephone Encounter (Signed)
Left message advising that shingrix vaccine is available in our office, patient can schedule nurse visit at her earliest convenience to get started with first vaccine---if appt is scheduled, let Rhylee Nunn,RN at [facility] office know so that both vaccines can be labeled and placed in refrig

## 2017-07-24 ENCOUNTER — Telehealth: Payer: Self-pay

## 2017-07-24 NOTE — Telephone Encounter (Signed)
Left message asking patient to call back to verify that she would like to get shingrix vaccine when she is here on may 3rd to see dr Tamala Julian (in dr smith's office visit)---if patient calls back, please let tamara,RN at [facility] office know so that both vaccines can be labeled and a note can be added to dr Tamala Julian appt

## 2017-07-29 ENCOUNTER — Other Ambulatory Visit: Payer: Self-pay | Admitting: Internal Medicine

## 2017-07-29 DIAGNOSIS — E038 Other specified hypothyroidism: Secondary | ICD-10-CM

## 2017-07-29 NOTE — Telephone Encounter (Signed)
Was put on 6/6

## 2017-07-29 NOTE — Telephone Encounter (Signed)
Both vaccines labeled/placed in refrigerator

## 2017-07-29 NOTE — Telephone Encounter (Signed)
Pt called and set her appt for 6.6.19 @ 2pm, call pt if needed

## 2017-08-22 ENCOUNTER — Ambulatory Visit: Payer: Self-pay

## 2017-08-22 ENCOUNTER — Ambulatory Visit (INDEPENDENT_AMBULATORY_CARE_PROVIDER_SITE_OTHER): Payer: BC Managed Care – PPO | Admitting: Family Medicine

## 2017-08-22 ENCOUNTER — Encounter: Payer: Self-pay | Admitting: Family Medicine

## 2017-08-22 VITALS — BP 112/82 | HR 121 | Ht 65.0 in | Wt 163.0 lb

## 2017-08-22 DIAGNOSIS — M25532 Pain in left wrist: Secondary | ICD-10-CM

## 2017-08-22 DIAGNOSIS — M654 Radial styloid tenosynovitis [de Quervain]: Secondary | ICD-10-CM | POA: Diagnosis not present

## 2017-08-22 MED ORDER — DICLOFENAC SODIUM 2 % TD SOLN: 2.0000 g | Freq: Two times a day (BID) | TRANSDERMAL | 3 refills | Status: DC

## 2017-08-22 NOTE — Patient Instructions (Signed)
Good to see you  Ice is your friend Ice 20 minutes 2 times daily. Usually after activity and before bed. Exercises 3 times a week. Keep up with PT  The squeeshy ball  And rubber band.  pennsaid pinkie amount topically 2 times daily as needed.   Injected again today  See me again in 5-6 weeks

## 2017-08-22 NOTE — Assessment & Plan Note (Signed)
Responded well to injections.  Discussed icing regimen and home exercises.  Discussed which activities of doing which wants to avoid.  Patient is to increase activity slowly.  Follow-up with me again in 4 weeks.  Worsening symptoms consider PRP and EMG

## 2017-08-22 NOTE — Progress Notes (Signed)
[name] [place],  [id number] Phone: [phone] Subjective:     CC: Left wrist pain  [id number]  Erin [name] is a 57 y.o. female coming in with complaint of wrist pain. She feels that her wrist is heeling but still isn't better. She said that she feels that she is at 90%. She has been doing physical therapy but states that she was released for a month due to her success. Patient states that she still does get sharp pains on occasion. Patient notes pain with elbow flexion. She states that dry needling does decrease her pain as well. She still does wear the brace at night as well.    Past Medical History:  Diagnosis Date  . Allergic rhinitis due to pollen   . Breast cancer ([place]) 2010  . Breast cancer ([place]) 2016   reoccurrence in scar  . Hypertension   . Hypothyroidism   . Osteopenia   . S/P radiation therapy 07/12/2014 through 08/16/2014    Left chest wall 4000 cGy in 20 sessions, left chest wall boost 1000 cGy in 5 sessions  . Wears glasses    Past Surgical History:  Procedure Laterality Date  . BREAST LUMPECTOMY Left 06/01/2014   Procedure: WIDE LOCAL EXCISION OF RECURRENT LEFT BREAST CANCER;  Surgeon: Rolm Bookbinder, MD;  Location: [facility];  Service: General;  Laterality: Left;  . COLONOSCOPY    . cyst removed     left lower jaw - at age 89 yr  . DILATATION & CURRETTAGE/HYSTEROSCOPY WITH RESECTOCOPE N/A 09/16/2013   Procedure: DILATATION & CURETTAGE/HYSTEROSCOPY WITH RESECTOCOPE;  Surgeon: Princess Bruins, MD;  Location: [facility];  Service: Gynecology;  Laterality: N/A;  1 hr.  . INGUINAL HERNIA REPAIR     right side as infant  . LAPAROSCOPIC PELVIC LYMPH NODE BIOPSY    . MASTECTOMY  2010   bilateral mastectomies-lt snbx-radiation post  . MASTECTOMY Bilateral   . OOPHORECTOMY  2016   patient denies  . Right knee arthroscopy  2008  . ROBOTIC  ASSISTED BILATERAL SALPINGO OOPHERECTOMY Bilateral 03/13/2015   Procedure: ROBOTIC ASSISTED BILATERAL SALPINGO OOPHORECTOMY With Washings;  Surgeon: Princess Bruins, MD;  Location: [facility];  Service: Gynecology;  Laterality: Bilateral;  . TONSILLECTOMY    . WISDOM TOOTH EXTRACTION     Social History   Socioeconomic History  . Marital status: Single    Spouse name: Not on file  . Number of children: 0  . Years of education: PhD  . Highest education level: Not on file  Occupational History  . Not on file  Social Needs  . Financial resource strain: Not on file  . Food insecurity:    Worry: Not on file    Inability: Not on file  . Transportation needs:    Medical: Not on file    Non-medical: Not on file  Tobacco Use  . Smoking status: Never Smoker  . Smokeless tobacco: Never Used  Substance and Sexual Activity  . Alcohol use: No    Alcohol/week: 0.0 oz    Frequency: Never    Comment: rare  . Drug use: No  . Sexual activity: Not Currently    Birth control/protection: None    Comment: did not answer high risk questions  Lifestyle  . Physical activity:    Days per week: Not on file    Minutes per session: Not on file  . Stress: Not on file  Relationships  . Social connections:  Talks on phone: Not on file    Gets together: Not on file    Attends religious service: Not on file    Active member of club or organization: Not on file    Attends meetings of clubs or organizations: Not on file    Relationship status: Not on file  Other Topics Concern  . Not on file  Social History Narrative   She grew up in [place] last 16 yrs in [place]   Rare caffeine use    Allergies  Allergen Reactions  . Pollen Extract   . Adhesive [Tape] Rash   Family History  Problem Relation Age of Onset  . Hypertension Mother   . Cancer Mother        Lung Cancer - long history of tobacco use  . Hypertension Father   . Stroke Maternal Grandfather   . Stroke Paternal Grandfather    . Hypertension Brother   . Colon cancer Neg Hx   . Stomach cancer Neg Hx      Past medical history, social, surgical and family history all reviewed in electronic medical record.  No pertanent information unless stated regarding to the chief complaint.   Review of Systems:Review of systems updated and as accurate as of 08/22/17  No headache, visual changes, nausea, vomiting, diarrhea, constipation, dizziness, abdominal pain, skin rash, fevers, chills, night sweats, weight loss, swollen lymph nodes, body aches, joint swelling, muscle aches, chest pain, shortness of breath, mood changes.  Positive muscle aches  Objective  Blood pressure 112/82, pulse (!) 121, height 5\' 5"  (1.651 m), weight 163 lb (73.9 kg), SpO2 97 %. Systems examined below as of 08/22/17   General: No apparent distress alert and oriented x3 mood and affect normal, dressed appropriately.  HEENT: Pupils equal, extraocular movements intact  Respiratory: Patient's speak in full sentences and does not appear short of breath  Cardiovascular: No lower extremity edema, non tender, no erythema  Skin: Warm dry intact with no signs of infection or rash on extremities or on axial skeleton.  Abdomen: Soft nontender  Neuro: Cranial nerves II through XII are intact, neurovascularly intact in all extremities with 2+ DTRs and 2+ pulses.  Lymph: No lymphadenopathy of posterior or anterior cervical chain or axillae bilaterally.  Gait normal with good balance and coordination.  MSK:  Non tender with full range of motion and good stability and symmetric strength and tone of shoulders, elbows,  hip, knee and ankles bilaterally.  Left wrist exam shows some mild atrophy of the thenar eminence.  Positive de Quervain's.  Negative carpal tunnel signs with Tinel's.  Near full range of motion.  Full strength noted no cervical radicular symptoms noted.  Procedure: Real-time Ultrasound Guided Injection of left Abductor pollicis longs tendon  sheath Device: GE Logiq Q7  Ultrasound guided injection is preferred based studies that show increased duration, increased effect, greater accuracy, decreased procedural pain, increased response rate with ultrasound guided versus blind injection.  Verbal informed consent obtained.  Time-out conducted.  Noted no overlying erythema, induration, or other signs of local infection.  Skin prepped in a sterile fashion.  Local anesthesia: Topical Ethyl chloride.  With sterile technique and under real time ultrasound guidance:  tendon visualized.  23g 5/8 inch needle inserted distal to proximal approach into tendon sheath. Pictures taken  for needle placement. Patient did have injection of 0.5 cc of of 0.5% Marcaine, and 0.5 cc of Kenalog 40 mg/dL. Completed without difficulty  Pain immediately resolved suggesting accurate  placement of the medication.  Advised to call if fevers/chills, erythema, induration, drainage, or persistent bleeding.  Images permanently stored and available for review in the ultrasound unit.  Impression: Technically successful ultrasound guided injection.    Impression and Recommendations:     This case required medical decision making of moderate complexity.      Note: This dictation was prepared with Dragon dictation along with smaller phrase technology. Any transcriptional errors that result from this process are unintentional.

## 2017-09-05 ENCOUNTER — Inpatient Hospital Stay: Payer: BC Managed Care – PPO

## 2017-09-05 ENCOUNTER — Inpatient Hospital Stay: Payer: BC Managed Care – PPO | Attending: Oncology

## 2017-09-05 DIAGNOSIS — Z79811 Long term (current) use of aromatase inhibitors: Secondary | ICD-10-CM | POA: Insufficient documentation

## 2017-09-05 DIAGNOSIS — C50412 Malignant neoplasm of upper-outer quadrant of left female breast: Secondary | ICD-10-CM

## 2017-09-05 DIAGNOSIS — M8580 Other specified disorders of bone density and structure, unspecified site: Secondary | ICD-10-CM | POA: Diagnosis present

## 2017-09-05 DIAGNOSIS — Z170 Estrogen receptor positive status [ER+]: Secondary | ICD-10-CM | POA: Diagnosis not present

## 2017-09-05 DIAGNOSIS — C50912 Malignant neoplasm of unspecified site of left female breast: Secondary | ICD-10-CM | POA: Insufficient documentation

## 2017-09-05 DIAGNOSIS — Z9013 Acquired absence of bilateral breasts and nipples: Secondary | ICD-10-CM | POA: Diagnosis not present

## 2017-09-05 DIAGNOSIS — N8500 Endometrial hyperplasia, unspecified: Secondary | ICD-10-CM

## 2017-09-05 LAB — CBC WITH DIFFERENTIAL/PLATELET
Basophils Absolute: 0.1 10*3/uL (ref 0.0–0.1)
Basophils Relative: 1 %
EOS PCT: 2 %
Eosinophils Absolute: 0.1 10*3/uL (ref 0.0–0.5)
HCT: 42.8 % (ref 34.8–46.6)
Hemoglobin: 14.4 g/dL (ref 11.6–15.9)
LYMPHS ABS: 1.6 10*3/uL (ref 0.9–3.3)
LYMPHS PCT: 23 %
MCH: 29 pg (ref 25.1–34.0)
MCHC: 33.7 g/dL (ref 31.5–36.0)
MCV: 86.1 fL (ref 79.5–101.0)
MONO ABS: 0.5 10*3/uL (ref 0.1–0.9)
Monocytes Relative: 8 %
Neutro Abs: 4.6 10*3/uL (ref 1.5–6.5)
Neutrophils Relative %: 66 %
PLATELETS: 223 10*3/uL (ref 145–400)
RBC: 4.97 MIL/uL (ref 3.70–5.45)
RDW: 13 % (ref 11.2–14.5)
WBC: 6.9 10*3/uL (ref 3.9–10.3)

## 2017-09-05 LAB — COMPREHENSIVE METABOLIC PANEL
ALBUMIN: 4.1 g/dL (ref 3.5–5.0)
ALT: 13 U/L (ref 0–55)
AST: 20 U/L (ref 5–34)
Alkaline Phosphatase: 78 U/L (ref 40–150)
Anion gap: 7 (ref 3–11)
BILIRUBIN TOTAL: 0.8 mg/dL (ref 0.2–1.2)
BUN: 25 mg/dL (ref 7–26)
CHLORIDE: 102 mmol/L (ref 98–109)
CO2: 27 mmol/L (ref 22–29)
Calcium: 10.1 mg/dL (ref 8.4–10.4)
Creatinine, Ser: 0.96 mg/dL (ref 0.60–1.10)
GFR calc Af Amer: >60 mL/min (ref >60)
GFR calc non Af Amer: >60 mL/min (ref >60)
GLUCOSE: 99 mg/dL (ref 70–140)
Potassium: 4.2 mmol/L (ref 3.5–5.1)
Sodium: 136 mmol/L (ref 136–145)
TOTAL PROTEIN: 6.8 g/dL (ref 6.4–8.3)

## 2017-09-05 MED ORDER — DENOSUMAB 60 MG/ML ~~LOC~~ SOLN
60.0000 mg | Freq: Once | SUBCUTANEOUS | Status: AC
  Administered 2017-09-05: 60 mg via SUBCUTANEOUS

## 2017-09-05 NOTE — Patient Instructions (Signed)
Denosumab injection What is this medicine? DENOSUMAB (den oh sue mab) slows bone breakdown. Prolia is used to treat osteoporosis in women after menopause and in men. Xgeva is used to prevent bone fractures and other bone problems caused by cancer bone metastases. Xgeva is also used to treat giant cell tumor of the bone. This medicine may be used for other purposes; ask your health care provider or pharmacist if you have questions. COMMON BRAND NAME(S): Prolia, XGEVA What should I tell my health care provider before I take this medicine? They need to know if you have any of these conditions: -dental disease -eczema -infection or history of infections -kidney disease or on dialysis -low blood calcium or vitamin D -malabsorption syndrome -scheduled to have surgery or tooth extraction -taking medicine that contains denosumab -thyroid or parathyroid disease -an unusual reaction to denosumab, other medicines, foods, dyes, or preservatives -pregnant or trying to get pregnant -breast-feeding How should I use this medicine? This medicine is for injection under the skin. It is given by a health care professional in a hospital or clinic setting. If you are getting Prolia, a special MedGuide will be given to you by the pharmacist with each prescription and refill. Be sure to read this information carefully each time. For Prolia, talk to your pediatrician regarding the use of this medicine in children. Special care may be needed. For Xgeva, talk to your pediatrician regarding the use of this medicine in children. While this drug may be prescribed for children as young as 13 years for selected conditions, precautions do apply. Overdosage: If you think you've taken too much of this medicine contact a poison control center or emergency room at once. Overdosage: If you think you have taken too much of this medicine contact a poison control center or emergency room at once. NOTE: This medicine is only for  you. Do not share this medicine with others. What if I miss a dose? It is important not to miss your dose. Call your doctor or health care professional if you are unable to keep an appointment. What may interact with this medicine? Do not take this medicine with any of the following medications: -other medicines containing denosumab This medicine may also interact with the following medications: -medicines that suppress the immune system -medicines that treat cancer -steroid medicines like prednisone or cortisone This list may not describe all possible interactions. Give your health care provider a list of all the medicines, herbs, non-prescription drugs, or dietary supplements you use. Also tell them if you smoke, drink alcohol, or use illegal drugs. Some items may interact with your medicine. What should I watch for while using this medicine? Visit your doctor or health care professional for regular checks on your progress. Your doctor or health care professional may order blood tests and other tests to see how you are doing. Call your doctor or health care professional if you get a cold or other infection while receiving this medicine. Do not treat yourself. This medicine may decrease your body's ability to fight infection. You should make sure you get enough calcium and vitamin D while you are taking this medicine, unless your doctor tells you not to. Discuss the foods you eat and the vitamins you take with your health care professional. See your dentist regularly. Brush and floss your teeth as directed. Before you have any dental work done, tell your dentist you are receiving this medicine. Do not become pregnant while taking this medicine or for 5 months after stopping   it. Women should inform their doctor if they wish to become pregnant or think they might be pregnant. There is a potential for serious side effects to an unborn child. Talk to your health care professional or pharmacist for more  information. What side effects may I notice from receiving this medicine? Side effects that you should report to your doctor or health care professional as soon as possible: -allergic reactions like skin rash, itching or hives, swelling of the face, lips, or tongue -breathing problems -chest pain -fast, irregular heartbeat -feeling faint or lightheaded, falls -fever, chills, or any other sign of infection -muscle spasms, tightening, or twitches -numbness or tingling -skin blisters or bumps, or is dry, peels, or red -slow healing or unexplained pain in the mouth or jaw -unusual bleeding or bruising Side effects that usually do not require medical attention (Report these to your doctor or health care professional if they continue or are bothersome.): -muscle pain -stomach upset, gas This list may not describe all possible side effects. Call your doctor for medical advice about side effects. You may report side effects to FDA at 1-800-FDA-1088. Where should I keep my medicine? This medicine is only given in a clinic, doctor's office, or other health care setting and will not be stored at home. NOTE: This sheet is a summary. It may not cover all possible information. If you have questions about this medicine, talk to your doctor, pharmacist, or health care provider.  2015, [employer]. (2011-10-07 12:37:47) Goserelin injection What is this medicine? GOSERELIN (GOE se rel in) is similar to a hormone found in the body. It lowers the amount of sex hormones that the body makes. Men will have lower testosterone levels and women will have lower estrogen levels while taking this medicine. In men, this medicine is used to treat prostate cancer; the injection is either given once per month or once every 12 weeks. A once per month injection (only) is used to treat women with endometriosis, dysfunctional uterine bleeding, or advanced breast cancer. This medicine may be used for other purposes;  ask your health care provider or pharmacist if you have questions. COMMON BRAND NAME(S): Zoladex What should I tell my health care provider before I take this medicine? They need to know if you have any of these conditions (some only apply to women): -diabetes -heart disease or previous heart attack -high blood pressure -high cholesterol -kidney disease -osteoporosis or low bone density -problems passing urine -spinal cord injury -stroke -tobacco smoker -an unusual or allergic reaction to goserelin, hormone therapy, other medicines, foods, dyes, or preservatives -pregnant or trying to get pregnant -breast-feeding How should I use this medicine? This medicine is for injection under the skin. It is given by a health care professional in a hospital or clinic setting. Men receive this injection once every 4 weeks or once every 12 weeks. Women will only receive the once every 4 weeks injection. Talk to your pediatrician regarding the use of this medicine in children. Special care may be needed. Overdosage: If you think you have taken too much of this medicine contact a poison control center or emergency room at once. NOTE: This medicine is only for you. Do not share this medicine with others. What if I miss a dose? It is important not to miss your dose. Call your doctor or health care professional if you are unable to keep an appointment. What may interact with this medicine? -female hormones like estrogen -herbal or dietary supplements like black cohosh, chasteberry,  or DHEA -female hormones like testosterone -prasterone This list may not describe all possible interactions. Give your health care provider a list of all the medicines, herbs, non-prescription drugs, or dietary supplements you use. Also tell them if you smoke, drink alcohol, or use illegal drugs. Some items may interact with your medicine. What should I watch for while using this medicine? Visit your doctor or health care  professional for regular checks on your progress. Your symptoms may appear to get worse during the first weeks of this therapy. Tell your doctor or healthcare professional if your symptoms do not start to get better or if they get worse after this time. Your bones may get weaker if you take this medicine for a long time. If you smoke or frequently drink alcohol you may increase your risk of bone loss. A family history of osteoporosis, chronic use of drugs for seizures (convulsions), or corticosteroids can also increase your risk of bone loss. Talk to your doctor about how to keep your bones strong. This medicine should stop regular monthly menstration in women. Tell your doctor if you continue to [facility]. Women should not become pregnant while taking this medicine or for 12 weeks after stopping this medicine. Women should inform their doctor if they wish to become pregnant or think they might be pregnant. There is a potential for serious side effects to an unborn child. Talk to your health care professional or pharmacist for more information. Do not breast-feed an infant while taking this medicine. Men should inform their doctors if they wish to father a child. This medicine may lower sperm counts. Talk to your health care professional or pharmacist for more information. What side effects may I notice from receiving this medicine? Side effects that you should report to your doctor or health care professional as soon as possible: -allergic reactions like skin rash, itching or hives, swelling of the face, lips, or tongue -bone pain -breathing problems -changes in vision -chest pain -feeling faint or lightheaded, falls -fever, chills -pain, swelling, warmth in the leg -pain, tingling, numbness in the hands or feet -signs and symptoms of low blood pressure like dizziness; feeling faint or lightheaded, falls; unusually weak or tired -stomach pain -swelling of the ankles, feet, hands -trouble passing  urine or change in the amount of urine -unusually high or low blood pressure -unusually weak or tired Side effects that usually do not require medical attention (report to your doctor or health care professional if they continue or are bothersome): -change in sex drive or performance -changes in breast size in both males and females -changes in emotions or moods -headache -hot flashes -irritation at site where injected -loss of appetite -skin problems like acne, dry skin -vaginal dryness This list may not describe all possible side effects. Call your doctor for medical advice about side effects. You may report side effects to FDA at 1-800-FDA-1088. Where should I keep my medicine? This drug is given in a hospital or clinic and will not be stored at home. NOTE: This sheet is a summary. It may not cover all possible information. If you have questions about this medicine, talk to your doctor, pharmacist, or health care provider.  2015, [employer]. (2013-06-15 11:10:35)

## 2017-09-10 NOTE — Progress Notes (Signed)
[facility]  [phone] Fax:([phone]) [phone]   ID: Erin [name] DOB: 03/17/1961 MR#: [id number] CSN#: [id number]     Patient Care Team: [name], MD as PCP - General (Internal Medicine) [name], [name], MD as Consulting Physician (Oncology) Rolm Bookbinder, MD as Consulting Physician (General Surgery) Princess Bruins, MD as Consulting Physician (Obstetrics and Gynecology) Crista Luria, MD as Attending Physician (Dermatology) Pyrtle, Lajuan Lines, MD as Consulting Physician (Gastroenterology) Arloa Koh, MD as Consulting Physician (Radiation Oncology) Bonner Puna, MD as Referring Physician (Radiation Oncology) Star Age, MD as Attending Physician (Neurology) Augustina Mood, DDS as Referring Physician (Dentistry) Lyndal Pulley, DO as Consulting Physician (Family Medicine) OTHER MD: Netty Starring  CHIEF COMPLAINT: Recurrent breast cancer  CURRENT THERAPY: anastrozole; denosumab/Prolia  INTERVAL HISTORY: [name] returns today for follow-up and treatment of her recurrent breast cancer.   The patient continues on anastrozole, and has occasional hot flashes that are manageable. They are not as bad as they used to be. She denies vaginal dryness.   She also continues on denosumab/ prolia, which she tolerates well.  She continues to take Tums on the day of the injection, which is very helpful  REVIEW OF SYSTEMS: Erin [name] is doing well overall. She is starting to trust that everything is going well. She reports that her thumb is starting to fell better. She has gotten two Cortizone shots in her thumb and has gone to physical therapy. There are other treatments she can do if she has not found relief. However, she wants to make sure that they won't affect her chemotherapy treatment. She continues to work. She has been walking outside, but reports that's she will start exercising inside because of the summer heat. She also will use the elliptical  and lift weights. The patient denies unusual headaches, visual changes, nausea, vomiting, or dizziness. There has been no unusual cough, phlegm production, or pleurisy. This been no change in bowel or bladder habits. The patient denies unexplained fatigue or unexplained weight loss, bleeding, rash, or fever. A detailed review of systems was otherwise noncontributory.   BREAST CANCER HISTORY From the original intake note 04/07/2011:  Ms. Erin [name] had routine screening mammography December of 2009 at the [facility] in [place], [place], showing an increasing asymmetry in her left breast. She was recalled for additional views December 29. She was noted to have heterogeneously dense breasts. It was a 5 cm area of architectural distortion in the lateral aspect of the left breast with no associated calcifications. There was a second lesion medial to this. Ultrasound showed a highly suspicious hypoechoic irregularly marginated mass measuring 3 cm at the 2:30 position 5 cm from the nipple. This was palpable to the mammographer. The second area in question I measured 7 mm and a third lesion was noted measuring 5 mm. Some left axillary lymph nodes were morphologically normal.  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 Bilateral breast MRIs were performed 05/04/2008 and showed, in the left breast, a large lobulated mass measuring up to 4.3 cm, and including both of the apparently separate masses the previously biopsied. In the right breast there were 2 indeterminate lesions. These were evaluated further with breast specific gamma imaging performed January 14 in both lesions were negative. The lesion in the left breast was markedly abnormal.  Given this complex history and with the background  of significant breast density, the patient opted for bilateral  mastectomies with left sentinel lymph node sampling. This was performed of 06/27/2008 and showed([id number]. [id number]) in the right breast, no malignancy. In the left breast there was a 3.2 cm invasive ductal carcinoma, grade 1, focally extending to the anterior margin of the lower outer quadrant. There was extensive angiolymphatic invasion, but both sentinel lymph nodes on the left were negative.  The patient received left-sided postmastectomy radiation including of the left chest wall and left supraclavicular fossa to a total dose of 50.4 Gy plus a 10 Gy scar boost. She had an Oncotype DX recurrence score of 17, further discussed below. She decided to forego reconstruction. She was tested for BRCA1 and 2 and was found to be negative. Given her overall prognosis, she did not receive chemotherapy, but started tamoxifen February of 2010, with good tolerance.  RECURRENT DISEASE: SUMMARY OF INITIAL PRESENTATION  [name] took tamoxifen for 5 years with no evidence of active disease area did in May 2015 we decided to stop the tamoxifen because of problems with endometrial hyperplasia. 6 months later, at the November visit, she was found to have a change in a small area of scaring her left inframammary fold. She was referred to dermatology and biopsy 04/21/2014 showed ([id number]) recurrent ductal adenocarcinoma (positive for gross cystic disease fluid protein, estrogen, and progesterone). She then underwent a staging PET scan in [facility] 05/06/2014. This showed a 1.9 cm sclerotic lesion in the right ischial tuberosity, with a maximum standard uptake value of 4.6. A sclerotic lesion in the left side of the L3 vertebral body measuring 1.6 cm had no hypermetabolic activity.  Her subsequent history is as detailed below.   Past Medical History:  Diagnosis Date  . Allergic rhinitis due to pollen   . Breast cancer ([facility]) 2010  . Breast cancer ([facility]) 2016   reoccurrence in scar  . Hypertension   .  Hypothyroidism   . Osteopenia   . S/P radiation therapy 07/12/2014 through 08/16/2014    Left chest wall 4000 cGy in 20 sessions, left chest wall boost 1000 cGy in 5 sessions  . Wears glasses   Diverticulosis       Past Surgical History:  Procedure Laterality Date  . BREAST LUMPECTOMY Left 06/01/2014   Procedure: WIDE LOCAL EXCISION OF RECURRENT LEFT BREAST CANCER;  Surgeon: Rolm Bookbinder, MD;  Location: [facility];  Service: General;  Laterality: Left;  . COLONOSCOPY    . cyst removed     left lower jaw - at age 32 yr  . DILATATION & CURRETTAGE/HYSTEROSCOPY WITH RESECTOCOPE N/A 09/16/2013   Procedure: DILATATION & CURETTAGE/HYSTEROSCOPY WITH RESECTOCOPE;  Surgeon: Princess Bruins, MD;  Location: [facility];  Service: Gynecology;  Laterality: N/A;  1 hr.  . INGUINAL HERNIA REPAIR     right side as infant  . LAPAROSCOPIC PELVIC LYMPH NODE BIOPSY    . MASTECTOMY  2010   bilateral mastectomies-lt snbx-radiation post  . MASTECTOMY Bilateral   . OOPHORECTOMY  2016   patient denies  . Right knee arthroscopy  2008  . ROBOTIC ASSISTED BILATERAL SALPINGO OOPHERECTOMY Bilateral 03/13/2015   Procedure: ROBOTIC ASSISTED BILATERAL SALPINGO OOPHORECTOMY With Washings;  Surgeon: Princess Bruins, MD;  Location: [facility];  Service: Gynecology;  Laterality: Bilateral;  . TONSILLECTOMY    . WISDOM TOOTH EXTRACTION      FAMILY HISTORY The patient's mother died in 0932 from complications of lung cancer. The patient has not been in touch with  her father her for approximately 30 years. She had no sisters, 1 brother, who is in good health. There is no breast or ovarian cancer in the family to her knowledge.  GYNECOLOGIC HISTORY:  GX P0, menarche at around age 46, the patient continued to have periods despite being on tamoxifen, although more irregularly. 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.  She lives alone and has no pets. Her work involves quite a bit of travel.     ADVANCED DIRECTIVES: not in place  HEALTH MAINTENANCE:      Social History   Tobacco Use  . Smoking status: Never Smoker  . Smokeless tobacco: Never Used  Substance Use Topics  . Alcohol use: No    Alcohol/week: 0.0 oz    Frequency: Never    Comment: rare  . Drug use: No      Colonoscopy: December 2013 [facility] Mares)  PAP: Feb 2011  Bone density: 06/2009, normal  Cholesterol: "good"      Allergies  Allergen Reactions  . Pollen Extract   . Adhesive [Tape] Rash    MEDICATIONS:    Current Outpatient Medications  Medication Sig Dispense Refill  . acetaminophen (TYLENOL) 500 MG tablet Take 500 mg by mouth every 6 (six) hours as needed for moderate pain or headache.    Marland Kitchen acyclovir ointment (ZOVIRAX) 5 % Apply 1 application topically every 3 (three) hours. As needed 5 g 5  . anastrozole (ARIMIDEX) 1 MG tablet Take 1 tablet (1 mg total) by mouth daily. 90 tablet 4  . Cholecalciferol (VITAMIN D3) 2000 UNITS capsule Take 2,000 Units by mouth daily.      Marland Kitchen denosumab (PROLIA) 60 MG/ML SOLN injection Inject 60 mg into the skin every 6 (six) months. Administer in upper arm, thigh, or abdomen    . Diclofenac Sodium (PENNSAID) 2 % SOLN Place 2 g onto the skin 2 (two) times daily. 112 g 3  . ibuprofen (ADVIL,MOTRIN) 200 MG tablet Take 200 mg by mouth every 6 (six) hours as needed for moderate pain.    Marland Kitchen levothyroxine (SYNTHROID, LEVOTHROID) 75 MCG tablet TAKE 1 TABLET(75 MCG) BY MOUTH DAILY BEFORE BREAKFAST 90 tablet 1  . loratadine (CLARITIN) 10 MG tablet Take 10 mg by mouth daily as needed for allergies.     Marland Kitchen losartan (COZAAR) 100 MG tablet TAKE 1 TABLET(100 MG) BY MOUTH DAILY 90 tablet 3  . Polyethyl Glycol-Propyl Glycol (SYSTANE OP) Apply 1 drop to eye as needed (for allergies).    . sodium chloride (OCEAN) 0.65 % SOLN nasal spray Place 1 spray into both nostrils as needed for congestion.    . Turmeric 500 MG  CAPS Take 500 mg by mouth 2 (two) times daily.     No current facility-administered medications for this visit.     OBJECTIVE:  Middle-aged white woman in no acute distress    Vitals:   09/11/17 1125  BP: 136/74  Pulse: 82  Resp: 18  Temp: 98.6 F (37 C)  SpO2: 99%     Body mass index is 26.97 kg/m.   ECOG FS: 0 - Asymptomatic  Sclerae unicteric, EOMs intact Oropharynx clear and moist No cervical or supraclavicular adenopathy Lungs no rales or rhonchi Heart regular rate and rhythm Abd soft, nontender, positive bowel sounds MSK no focal spinal tenderness, no upper extremity lymphedema Neuro: nonfocal, well oriented, appropriate affect Breasts: Status post bilateral mastectomies.  There is no evidence of chest wall recurrence.  Both axillae are  benign.   LABS:           Chemistry      Component Value Date/Time   NA 136 09/05/2017 1533   NA 139 02/28/2017 1521   K 4.2 09/05/2017 1533   K 4.6 02/28/2017 1521   CL 102 09/05/2017 1533   CL 105 08/10/2012 0944   CO2 27 09/05/2017 1533   CO2 29 02/28/2017 1521   BUN 25 09/05/2017 1533   BUN 20.0 02/28/2017 1521   CREATININE 0.96 09/05/2017 1533   CREATININE 1.0 02/28/2017 1521      Component Value Date/Time   CALCIUM 10.1 09/05/2017 1533   CALCIUM 10.4 02/28/2017 1521   ALKPHOS 78 09/05/2017 1533   ALKPHOS 62 02/28/2017 1521   AST 20 09/05/2017 1533   AST 18 02/28/2017 1521   ALT 13 09/05/2017 1533   ALT 13 02/28/2017 1521   BILITOT 0.8 09/05/2017 1533   BILITOT 1.17 02/28/2017 1521         Lab Results  Component Value Date   WBC 6.9 09/05/2017   HGB 14.4 09/05/2017   HCT 42.8 09/05/2017   MCV 86.1 09/05/2017   PLT 223 09/05/2017   NEUTROABS 4.6 09/05/2017    STUDIES  [place] Limited Joint Space Structures Up Left  Result Date: 09/06/2017 Procedure: Real-time Ultrasound Guided Injection of left Abductor pollicis longs tendon sheath Device: GE Logiq Q7 Ultrasound guided injection is preferred based  studies that show increased duration, increased effect, greater accuracy, decreased procedural pain, increased response rate with ultrasound guided versus blind injection. Verbal informed consent obtained. Time-out conducted. Noted no overlying erythema, induration, or other signs of local infection. Skin prepped in a sterile fashion. Local anesthesia: Topical Ethyl chloride. With sterile technique and under real time ultrasound guidance:  tendon visualized.  23g 5/8 inch needle inserted distal to proximal approach into tendon sheath. Pictures taken  for needle placement. Patient did have injection of 0.5 cc of of 0.5% Marcaine, and 0.5 cc of Kenalog 40 mg/dL. Completed without difficulty Pain immediately resolved suggesting accurate placement of the medication. Advised to call if fevers/chills, erythema, induration, drainage, or persistent bleeding. Images permanently stored and available for review in the ultrasound unit. Impression: Technically successful ultrasound guided injection.     ASSESSMENT: 57 y.o.  BRCA negative [place] woman ,  (1) status post bilateral mastectomies January 2010 for a left-sided upper outer quadrant T2 N0 (stage IIA) invasive ductal carcinoma, grade 1, strongly estrogen and progesterone receptor positive, HER-2 nonamplified,   (2) Oncotype DX recurrence score of 17, predicting a 10-12% distant recurrence risk over 10 years if her only adjuvant treatment is tamoxifen for 5 years  (3) status post Left postmastectomy radiation including Left supraclavicular fossa (5040 cGy in 28 sessions)  (4) on tamoxifen starting February of 2010, stopped May 2015 because of endometrial hyperplasia  (5) osteopenia-- T score -1.2 on repeated bone density scans 09/24/2011 and 12/12/2013  RECURRENT DISEASE (6) skin biopsy of a lesion under the left breast scar 04/21/2014 shows adenocarcinoma, gross cystic disease fluid protein, estrogen and progesterone receptor positive  (a) s/p wide  excision with negative though close margins 06/01/2014, prognostic panel pending  (b) radiation to Left chest wall 4000 cGy in 20 sessions, left schest wall boost 1000 cGy in 5 session 07/12/2014 through 08/16/2014  (7) PET scan and bone scan show only one additional area of concern, in the right ischiial tuberosity--  (a) biopsy 05/24/2014 showed no evidence of malignancy  (b) zolendronate started 06/06/2014,  poorly tolerated  (c) denosumab/ Xgeva started 09/26/2014, switched to Prolia as of May 2017  (d) repeat bone scan 12/20/2014 shows no new areas of disease, and the previously biopsied area to be delivered  (e) restaging studies 07/07/2015 do not support a diagnosis of metastatic disease.  (f) MRI of the pelvis 04/13/2016 shows the 2.2 lesion in the right is she'll tuberosity to be unchanged  (8) goserelin monthly started 05/09/2016, last dose 02/20/2015  (a) s/p BSO 03/13/2015 with benign pathology  (9) anastrozole started  08/29/2014  (a) bone density 12/13/2015 shows improvement, with the lumbar spine T score now at 0.1, the femoral neck -1.1, both increased  (b) continues on denosumab/Prolia most recent dose 09/05/2017   PLAN: [name] is now 3-1/2 years out from definitive diagnosis of local recurrence, and concern regarding metastatic disease to bone.  There is no evidence of active disease.  This is very favorable.  She continues on anastrozole with good tolerance and the plan will be to continue that for a total of 7 years.  She has been on denosumab now for 3 years.  She is already scheduled for repeat bone density scan through Dr. Sharlet Salina sometime this summer.  I anticipate that will be at least stable.  If so we will hold the denosumab for at least 2 years, which is when she will have her next bone density scan  I am going to obtain a repeat pelvic MRI before her return visit here, which will be in 6 months  I commended her excellent exercise program.  We  discussed the various possible treatments that she is considering for her de Quervain's.  The one that I would be concerned about, possibly because I understand it the least, is the use of lasers, but the other plan treatments I think should be fine.  She only had 2 lymph nodes removed so her risk of lymphedema is low  She knows to call for any problems or issues that may develop before the next visit.  [name], [name], MD  09/11/17 11:44 AM Medical Oncology and Hematology [facility] [address] [place], [place] [address] Tel. [phone]    Fax. [phone]    This document serves as a record of services personally performed by Chauncey Cruel, MD. It was created on his behalf by Margit Banda, a trained medical scribe. The creation of this record is based on the scribe's personal observations and the provider's statements to them.   I have reviewed the above documentation for accuracy and completeness, and I agree with the above.

## 2017-09-11 ENCOUNTER — Telehealth: Payer: Self-pay | Admitting: Oncology

## 2017-09-11 ENCOUNTER — Inpatient Hospital Stay: Payer: BC Managed Care – PPO | Admitting: Oncology

## 2017-09-11 VITALS — BP 136/74 | HR 82 | Temp 98.6°F | Resp 18 | Ht 65.0 in | Wt 162.1 lb

## 2017-09-11 DIAGNOSIS — M8580 Other specified disorders of bone density and structure, unspecified site: Secondary | ICD-10-CM | POA: Diagnosis not present

## 2017-09-11 DIAGNOSIS — Z9013 Acquired absence of bilateral breasts and nipples: Secondary | ICD-10-CM | POA: Diagnosis not present

## 2017-09-11 DIAGNOSIS — C50412 Malignant neoplasm of upper-outer quadrant of left female breast: Secondary | ICD-10-CM

## 2017-09-11 DIAGNOSIS — Z79811 Long term (current) use of aromatase inhibitors: Secondary | ICD-10-CM

## 2017-09-11 DIAGNOSIS — C50912 Malignant neoplasm of unspecified site of left female breast: Secondary | ICD-10-CM | POA: Diagnosis not present

## 2017-09-11 DIAGNOSIS — Z170 Estrogen receptor positive status [ER+]: Secondary | ICD-10-CM

## 2017-09-11 NOTE — Telephone Encounter (Signed)
Gave patient AVs and calendar of upcoming appointments. Patient scheduled November per her request and would like mri scheduled that day.

## 2017-09-24 NOTE — Progress Notes (Signed)
[name] [address] [place], [place] [address] Phone: [phone] Subjective:     CC: Left wrist follow-up  DQQ:[id number]  Erin [name] is a 57 y.o. female coming in with complaint of left wrist pain.  Patient was found to have a de Quervain's tenosynovitis.  Given injection at last exam and states that she is feeling 95% better.  Multiple days where she does not notice the pain at all.  Patient is wondering what the next goal is not when she can increase her activity.     Past Medical History:  Diagnosis Date  . Allergic rhinitis due to pollen   . Breast cancer ([facility]) 2010  . Breast cancer ([facility]) 2016   reoccurrence in scar  . Hypertension   . Hypothyroidism   . Osteopenia   . S/P radiation therapy 07/12/2014 through 08/16/2014    Left chest wall 4000 cGy in 20 sessions, left chest wall boost 1000 cGy in 5 sessions  . Wears glasses    Past Surgical History:  Procedure Laterality Date  . BREAST LUMPECTOMY Left 06/01/2014   Procedure: WIDE LOCAL EXCISION OF RECURRENT LEFT BREAST CANCER;  Surgeon: Rolm Bookbinder, MD;  Location: [facility];  Service: General;  Laterality: Left;  . COLONOSCOPY    . cyst removed     left lower jaw - at age 29 yr  . DILATATION & CURRETTAGE/HYSTEROSCOPY WITH RESECTOCOPE N/A 09/16/2013   Procedure: DILATATION & CURETTAGE/HYSTEROSCOPY WITH RESECTOCOPE;  Surgeon: Princess Bruins, MD;  Location: [facility];  Service: Gynecology;  Laterality: N/A;  1 hr.  . INGUINAL HERNIA REPAIR     right side as infant  . LAPAROSCOPIC PELVIC LYMPH NODE BIOPSY    . MASTECTOMY  2010   bilateral mastectomies-lt snbx-radiation post  . MASTECTOMY Bilateral   . OOPHORECTOMY  2016   patient denies  . Right knee arthroscopy  2008  . ROBOTIC ASSISTED BILATERAL SALPINGO OOPHERECTOMY Bilateral 03/13/2015   Procedure: ROBOTIC ASSISTED BILATERAL SALPINGO OOPHORECTOMY With  Washings;  Surgeon: Princess Bruins, MD;  Location: [facility];  Service: Gynecology;  Laterality: Bilateral;  . TONSILLECTOMY    . WISDOM TOOTH EXTRACTION     Social History   Socioeconomic History  . Marital status: Single    Spouse name: Not on file  . Number of children: 0  . Years of education: PhD  . Highest education level: Not on file  Occupational History  . Not on file  Social Needs  . Financial resource strain: Not on file  . Food insecurity:    Worry: Not on file    Inability: Not on file  . Transportation needs:    Medical: Not on file    Non-medical: Not on file  Tobacco Use  . Smoking status: Never Smoker  . Smokeless tobacco: Never Used  Substance and Sexual Activity  . Alcohol use: No    Alcohol/week: 0.0 oz    Frequency: Never    Comment: rare  . Drug use: No  . Sexual activity: Not Currently    Birth control/protection: None    Comment: did not answer high risk questions  Lifestyle  . Physical activity:    Days per week: Not on file    Minutes per session: Not on file  . Stress: Not on file  Relationships  . Social connections:    Talks on phone: Not on file    Gets together: Not on file    Attends religious service: Not on  file    Active member of club or organization: Not on file    Attends meetings of clubs or organizations: Not on file    Relationship status: Not on file  Other Topics Concern  . Not on file  Social History Narrative   She grew up in [place] last 16 yrs in [place]   Rare caffeine use    Allergies  Allergen Reactions  . Pollen Extract   . Adhesive [Tape] Rash   Family History  Problem Relation Age of Onset  . Hypertension Mother   . Cancer Mother        Lung Cancer - long history of tobacco use  . Hypertension Father   . Stroke Maternal Grandfather   . Stroke Paternal Grandfather   . Hypertension Brother   . Colon cancer Neg Hx   . Stomach cancer Neg Hx      Past medical history, social, surgical and  family history all reviewed in electronic medical record.  No pertanent information unless stated regarding to the chief complaint.   Review of Systems:Review of systems updated and as accurate as of 09/25/17  No headache, visual changes, nausea, vomiting, diarrhea, constipation, dizziness, abdominal pain, skin rash, fevers, chills, night sweats, weight loss, swollen lymph nodes, body aches, joint swelling, muscle aches, chest pain, shortness of breath, mood changes.   Objective  Blood pressure 138/84, pulse 75, height 5\' 5"  (1.651 m), weight 161 lb (73 kg), SpO2 97 %. Systems examined below as of 09/25/17   General: No apparent distress alert and oriented x3 mood and affect normal, dressed appropriately.  HEENT: Pupils equal, extraocular movements intact  Respiratory: Patient's speak in full sentences and does not appear short of breath  Cardiovascular: No lower extremity edema, non tender, no erythema  Skin: Warm dry intact with no signs of infection or rash on extremities or on axial skeleton.  Abdomen: Soft nontender  Neuro: Cranial nerves II through XII are intact, neurovascularly intact in all extremities with 2+ DTRs and 2+ pulses.  Lymph: No lymphadenopathy of posterior or anterior cervical chain or axillae bilaterally.  Gait normal with good balance and coordination.  MSK:  Non tender with full range of motion and good stability and symmetric strength and tone of shoulders, elbows, hip, knee and ankles bilaterally.  Wrist: Left Inspection normal with no visible erythema or swelling. ROM smooth and normal with good flexion and extension and ulnar/radial deviation that is symmetrical with opposite wrist. Palpation is normal over metacarpals, navicular, lunate, and TFCC; tendons without tenderness/ swelling No snuffbox tenderness. No tenderness over Canal of Guyon. Strength 5/5 in all directions without pain. Negative Finkelstein, tinel's and phalens. Negative Watson's  test. Contralateral wrist unremarkable   Impression and Recommendations:     This case required medical decision making of moderate complexity.      Note: This dictation was prepared with Dragon dictation along with smaller phrase technology. Any transcriptional errors that result from this process are unintentional.

## 2017-09-25 ENCOUNTER — Ambulatory Visit: Payer: BC Managed Care – PPO | Admitting: Family Medicine

## 2017-09-25 ENCOUNTER — Ambulatory Visit: Payer: BC Managed Care – PPO

## 2017-09-25 VITALS — BP 138/84 | HR 75 | Ht 65.0 in | Wt 161.0 lb

## 2017-09-25 DIAGNOSIS — Z23 Encounter for immunization: Secondary | ICD-10-CM

## 2017-09-25 DIAGNOSIS — M654 Radial styloid tenosynovitis [de Quervain]: Secondary | ICD-10-CM | POA: Diagnosis not present

## 2017-09-25 DIAGNOSIS — Z299 Encounter for prophylactic measures, unspecified: Secondary | ICD-10-CM

## 2017-09-25 NOTE — Assessment & Plan Note (Signed)
Patient doing much better after the second injection.  Discussed with patient about bracing and icing and topical anti-inflammatories only as needed.  Continue the other natural supplementations and follow-up as needed

## 2017-09-25 NOTE — Patient Instructions (Signed)
Good to see you  Ice is your friend Han[facility] Seamen you the injection  You are doing great  Pennsaid and ice as needed Brace at night if needed and during the day only if some pain  OK to increase activity as tolerated After stopping everything else then come off the turmeric and see how you feel  As long as you do well see me when you need me

## 2017-10-04 ENCOUNTER — Other Ambulatory Visit: Payer: Self-pay | Admitting: Oncology

## 2017-10-06 ENCOUNTER — Telehealth: Payer: Self-pay | Admitting: Internal Medicine

## 2017-10-06 ENCOUNTER — Other Ambulatory Visit: Payer: Self-pay | Admitting: Internal Medicine

## 2017-10-06 MED ORDER — ACYCLOVIR 5 % EX OINT: 1.0000 "application " | TOPICAL_OINTMENT | CUTANEOUS | 5 refills | Status: DC

## 2017-10-06 NOTE — Telephone Encounter (Signed)
There is already future orders for both vitamin D and thyroid.

## 2017-10-06 NOTE — Telephone Encounter (Signed)
Refill request on zovirax ointment 5%. LOV 04/17/17 Last refilled 10/29/16 by Dr. Graciela Husbands pharmacy on [address].

## 2017-10-06 NOTE — Telephone Encounter (Signed)
Copied from [place] [phone]. Topic: Quick Communication - See Telephone Encounter >> Oct 06, 2017  9:18 AM Boyd Kerbs wrote: CRM for notification. See Telephone encounter for: 10/06/17.  Pt. Thought she was to have orders in for thyroid and possible vit. D. (June), there is no orders in to schedule.  She is also asking about a Measles,  (mother said had the MMR when little) does she need shot?   She may have had one in the 80's and is there a blood test to check if had this?    Please call her

## 2017-10-06 NOTE — Telephone Encounter (Signed)
LVM for patient to call back. Labs for Vit D and Thyroid are in and patient can come at anytime to get those drawn. Patient can make an appointment to go over the measles as there are a few options.

## 2017-10-06 NOTE — Telephone Encounter (Signed)
Copied from [place] [id number]. Topic: Quick Communication - Rx Refill/Question >> Oct 06, 2017  9:15 AM Boyd Kerbs wrote: Medication:   acyclovir ointment (ZOVIRAX) 5 %  Has the patient contacted their pharmacy? Yes.   (Agent: If no, request that the patient contact the pharmacy for the refill.) (Agent: If yes, when and what did the pharmacy advise?)  Preferred Pharmacy (with phone number or street name):   [facility] - [address], [address] [place] [address] Phone: [phone] Fax: [phone]    Agent: Please be advised that RX refills may take up to 3 business days. We ask that you follow-up with your pharmacy.

## 2017-10-21 ENCOUNTER — Ambulatory Visit: Payer: BC Managed Care – PPO

## 2017-10-21 ENCOUNTER — Other Ambulatory Visit (INDEPENDENT_AMBULATORY_CARE_PROVIDER_SITE_OTHER): Payer: BC Managed Care – PPO

## 2017-10-21 ENCOUNTER — Other Ambulatory Visit: Payer: Self-pay | Admitting: Internal Medicine

## 2017-10-21 DIAGNOSIS — E039 Hypothyroidism, unspecified: Secondary | ICD-10-CM | POA: Diagnosis not present

## 2017-10-21 LAB — TSH: TSH: 1.19 u[IU]/mL (ref 0.35–4.50)

## 2017-10-21 LAB — T4, FREE: Free T4: 1.3 ng/dL (ref 0.60–1.60)

## 2017-12-18 ENCOUNTER — Ambulatory Visit (INDEPENDENT_AMBULATORY_CARE_PROVIDER_SITE_OTHER): Payer: BC Managed Care – PPO

## 2017-12-18 DIAGNOSIS — Z23 Encounter for immunization: Secondary | ICD-10-CM

## 2017-12-25 ENCOUNTER — Other Ambulatory Visit: Payer: BC Managed Care – PPO

## 2018-01-03 ENCOUNTER — Other Ambulatory Visit: Payer: Self-pay | Admitting: Oncology

## 2018-01-23 ENCOUNTER — Other Ambulatory Visit: Payer: Self-pay | Admitting: Internal Medicine

## 2018-01-30 ENCOUNTER — Other Ambulatory Visit (HOSPITAL_COMMUNITY): Payer: BC Managed Care – PPO

## 2018-01-30 ENCOUNTER — Other Ambulatory Visit (HOSPITAL_BASED_OUTPATIENT_CLINIC_OR_DEPARTMENT_OTHER): Payer: BC Managed Care – PPO

## 2018-01-30 ENCOUNTER — Ambulatory Visit (INDEPENDENT_AMBULATORY_CARE_PROVIDER_SITE_OTHER): Payer: BC Managed Care – PPO

## 2018-01-30 ENCOUNTER — Ambulatory Visit (INDEPENDENT_AMBULATORY_CARE_PROVIDER_SITE_OTHER)
Admission: RE | Admit: 2018-01-30 | Discharge: 2018-01-30 | Disposition: A | Discharge status: Home / Self Care | Payer: BC Managed Care – PPO | Source: Ambulatory Visit | Attending: Internal Medicine | Admitting: Internal Medicine

## 2018-01-30 DIAGNOSIS — M8580 Other specified disorders of bone density and structure, unspecified site: Secondary | ICD-10-CM

## 2018-01-30 DIAGNOSIS — Z23 Encounter for immunization: Secondary | ICD-10-CM

## 2018-03-16 ENCOUNTER — Encounter: Payer: BC Managed Care – PPO | Admitting: Obstetrics & Gynecology

## 2018-03-17 ENCOUNTER — Ambulatory Visit (INDEPENDENT_AMBULATORY_CARE_PROVIDER_SITE_OTHER): Payer: BC Managed Care – PPO | Admitting: Obstetrics & Gynecology

## 2018-03-17 ENCOUNTER — Encounter: Payer: Self-pay | Admitting: Obstetrics & Gynecology

## 2018-03-17 VITALS — BP 136/80 | Ht 65.0 in | Wt 160.0 lb

## 2018-03-17 DIAGNOSIS — M8588 Other specified disorders of bone density and structure, other site: Secondary | ICD-10-CM | POA: Diagnosis not present

## 2018-03-17 DIAGNOSIS — Z853 Personal history of malignant neoplasm of breast: Secondary | ICD-10-CM | POA: Diagnosis not present

## 2018-03-17 DIAGNOSIS — Z01411 Encounter for gynecological examination (general) (routine) with abnormal findings: Secondary | ICD-10-CM | POA: Diagnosis not present

## 2018-03-17 DIAGNOSIS — Z780 Asymptomatic menopausal state: Secondary | ICD-10-CM

## 2018-03-17 DIAGNOSIS — C50912 Malignant neoplasm of unspecified site of left female breast: Secondary | ICD-10-CM

## 2018-03-17 DIAGNOSIS — M85851 Other specified disorders of bone density and structure, right thigh: Secondary | ICD-10-CM

## 2018-03-17 NOTE — Progress Notes (Signed)
Erin [name] 07/10/1960 [id number]   History:    57 y.o. G0 Single  RP:  Established patient presenting for annual gyn exam   HPI: Surgical menopause status post laparoscopic bilateral salpingo-oophorectomy November 2016.  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  Status post bilateral mastectomy. A PET scan in May 06, 2014 showed a 1.9 cm sclerotic lesion in the right initial tuberosity.  Followed by MRIs of the pelvis with no evidence of active disease. MRI scheduled tomorrow.  She is on Arimidex.  Her bone density showed improvement of osteopenia on Prolia 01/2018. No current pelvic pain.  No postmenopausal bleeding.  Abstinent currently.  Very mild menopausal symptoms.  Urine and bowel movements normal.  BMI 26.63.  Physically active.  Past medical history,surgical history, family history and social history were all reviewed and documented in the EPIC chart.  Gynecologic History No LMP recorded (lmp unknown). (Menstrual status: Other). Contraception: post menopausal status/BSO Last Pap: 02/2017. Results were: Negative Last mammogram: S/P Bilateral Mastectomy Bone Density: 01/30/2018 Osteopenia T-Score -1.1.  On Prolia.  Followed by Dr [name]. Colonoscopy: 03/2012  Obstetric History OB History  Gravida Para Term Preterm AB Living  0 0 0 0 0 0  SAB TAB Ectopic Multiple Live Births  0 0 0 0 0     ROS: A ROS was performed and pertinent positives and negatives are included in the history.  GENERAL: No fevers or chills. HEENT: No change in vision, no earache, sore throat or sinus congestion. NECK: No pain or stiffness. CARDIOVASCULAR: No chest pain or pressure. No palpitations. PULMONARY: No shortness of breath, cough or wheeze. GASTROINTESTINAL: No abdominal pain, nausea, vomiting or diarrhea, melena or bright red blood  per rectum. GENITOURINARY: No urinary frequency, urgency, hesitancy or dysuria. MUSCULOSKELETAL: No joint or muscle pain, no back pain, no recent trauma. DERMATOLOGIC: No rash, no itching, no lesions. ENDOCRINE: No polyuria, polydipsia, no heat or cold intolerance. No recent change in weight. HEMATOLOGICAL: No anemia or easy bruising or bleeding. NEUROLOGIC: No headache, seizures, numbness, tingling or weakness. PSYCHIATRIC: No depression, no loss of interest in normal activity or change in sleep pattern.     Exam:   BP 136/80 (BP Location: Right Arm, Patient Position: Sitting, Cuff Size: Normal)   Ht 5\' 5"  (1.651 m)   Wt 160 lb (72.6 kg)   LMP  (LMP Unknown)   BMI 26.63 kg/m   Body mass index is 26.63 kg/m.  General appearance : Well developed well nourished female. No acute distress HEENT: Eyes: no retinal hemorrhage or exudates,  Neck supple, trachea midline, no carotid bruits, no thyroidmegaly Lungs: Clear to auscultation, no rhonchi or wheezes, or rib retractions  Heart: Regular rate and rhythm, no murmurs or gallops Breast:  Bilateral mastectomy.  New line of induration superior left breast.   No axillary or supraclavicular lymphadenopathy Abdomen: no palpable masses or tenderness, no rebound or guarding Extremities: no edema or skin discoloration or tenderness  Pelvic: Vulva: Normal             Vagina: No gross lesions or discharge  Cervix: No gross lesions or discharge.  Pap reflex done  Uterus  RV, normal size, shape and consistency, non-tender and mobile  Adnexa  Without masses or tenderness  Anus: Normal   Assessment/Plan:  57 y.o. female for annual exam   1. Encounter for gynecological  examination with abnormal finding Normal gynecologic exam.  Status post BSO.  Pap reflex done.  Breast exam, status post bilateral mastectomy.  Indurated ridge at the upper left breast.  Has an appointment with Dr. [name] scheduled, will decide on investigation with him.  Health labs  with Dr. [name] and family physician.  Colonoscopy December 2013.  Body mass index 26.63.  Continue with fitness and healthy nutrition. - Pap IG, CT/NG NAA, and HPV (high risk)  2. Postmenopausal Postmenopausal, well on no hormone replacement therapy.  No postmenopausal bleeding.   3. Osteopenia of neck of right femur On Prolia with improved bone density October 2019.  Osteopenia with a T score of -1.1.  Managing with Dr. [name].  Recommend vitamin D supplements, calcium intake of 1.5 g/day and regular weightbearing physical activities.  4. Recurrent cancer of left breast (HCC) On Arimidex.  Followed by Dr. [name].  Other orders - VITAMIN D PO; Take 1 tablet by mouth daily.  Princess Bruins MD, 4:16 PM 03/17/2018

## 2018-03-18 ENCOUNTER — Ambulatory Visit (HOSPITAL_COMMUNITY)
Admission: RE | Admit: 2018-03-18 | Discharge: 2018-03-18 | Disposition: A | Discharge status: Home / Self Care | Payer: BC Managed Care – PPO | Source: Ambulatory Visit | Attending: Oncology | Admitting: Oncology

## 2018-03-18 ENCOUNTER — Encounter: Payer: Self-pay | Admitting: Obstetrics & Gynecology

## 2018-03-18 DIAGNOSIS — M899 Disorder of bone, unspecified: Secondary | ICD-10-CM | POA: Insufficient documentation

## 2018-03-18 DIAGNOSIS — C50912 Malignant neoplasm of unspecified site of left female breast: Secondary | ICD-10-CM

## 2018-03-18 DIAGNOSIS — C50412 Malignant neoplasm of upper-outer quadrant of left female breast: Secondary | ICD-10-CM | POA: Insufficient documentation

## 2018-03-18 DIAGNOSIS — Z170 Estrogen receptor positive status [ER+]: Secondary | ICD-10-CM

## 2018-03-18 LAB — POCT I-STAT CREATININE: Creatinine, Ser: 0.9 mg/dL (ref 0.44–1.00)

## 2018-03-18 LAB — PAP, TP IMAGING W/ HPV RNA, RFLX HPV TYPE 16,18/45: HPV DNA High Risk: NOT DETECTED

## 2018-03-18 MED ORDER — GADOBUTROL 1 MMOL/ML IV SOLN
7.5000 mL | Freq: Once | INTRAVENOUS | Status: AC | PRN
  Administered 2018-03-18: 7 mL via INTRAVENOUS

## 2018-03-18 NOTE — Patient Instructions (Signed)
1. Encounter for gynecological examination with abnormal finding Normal gynecologic exam.  Status post BSO.  Pap reflex done.  Breast exam, status post bilateral mastectomy.  Indurated ridge at the upper left breast.  Has an appointment with Dr. [name] scheduled, will decide on investigation with him.  Health labs with Dr. [name] and family physician.  Colonoscopy December 2013.  Body mass index 26.63.  Continue with fitness and healthy nutrition. - Pap IG, CT/NG NAA, and HPV (high risk)  2. Postmenopausal Postmenopausal, well on no hormone replacement therapy.  No postmenopausal bleeding.   3. Osteopenia of neck of right femur On Prolia with improved bone density October 2019.  Osteopenia with a T score of -1.1.  Managing with Dr. [name].  Recommend vitamin D supplements, calcium intake of 1.5 g/day and regular weightbearing physical activities.  4. Recurrent cancer of left breast (HCC) On Arimidex.  Followed by Dr. [name].  Other orders - VITAMIN D PO; Take 1 tablet by mouth daily.  Erin [name], it was a pleasure seeing you today!

## 2018-03-20 ENCOUNTER — Inpatient Hospital Stay: Payer: BC Managed Care – PPO | Attending: Oncology

## 2018-03-20 DIAGNOSIS — C50412 Malignant neoplasm of upper-outer quadrant of left female breast: Secondary | ICD-10-CM

## 2018-03-20 LAB — COMPREHENSIVE METABOLIC PANEL
ALBUMIN: 4.1 g/dL (ref 3.5–5.0)
ALK PHOS: 79 U/L (ref 38–126)
ALT: 11 U/L (ref 0–44)
ANION GAP: 9 (ref 5–15)
AST: 17 U/L (ref 15–41)
BILIRUBIN TOTAL: 1.2 mg/dL (ref 0.3–1.2)
BUN: 22 mg/dL — AB (ref 6–20)
CO2: 28 mmol/L (ref 22–32)
CREATININE: 0.99 mg/dL (ref 0.44–1.00)
Calcium: 10 mg/dL (ref 8.9–10.3)
Chloride: 101 mmol/L (ref 98–111)
GFR calc Af Amer: >60 mL/min (ref >60)
GFR calc non Af Amer: >60 mL/min (ref >60)
Glucose, Bld: 94 mg/dL (ref 70–99)
Potassium: 4.6 mmol/L (ref 3.5–5.1)
Sodium: 138 mmol/L (ref 135–145)
Total Protein: 7.1 g/dL (ref 6.5–8.1)

## 2018-03-20 LAB — CBC WITH DIFFERENTIAL/PLATELET
Abs Immature Granulocytes: 0.01 10*3/uL (ref 0.00–0.07)
Basophils Absolute: 0.1 10*3/uL (ref 0.0–0.1)
Basophils Relative: 1 %
EOS PCT: 2 %
Eosinophils Absolute: 0.1 10*3/uL (ref 0.0–0.5)
HCT: 45.3 % (ref 36.0–46.0)
HEMOGLOBIN: 15 g/dL (ref 12.0–15.0)
Immature Granulocytes: 0 %
LYMPHS PCT: 27 %
Lymphs Abs: 1.7 10*3/uL (ref 0.7–4.0)
MCH: 28.9 pg (ref 26.0–34.0)
MCHC: 33.1 g/dL (ref 30.0–36.0)
MCV: 87.3 fL (ref 80.0–100.0)
MONO ABS: 0.6 10*3/uL (ref 0.1–1.0)
Monocytes Relative: 9 %
Neutro Abs: 3.7 10*3/uL (ref 1.7–7.7)
Neutrophils Relative %: 61 %
Platelets: 240 10*3/uL (ref 150–400)
RBC: 5.19 MIL/uL — AB (ref 3.87–5.11)
RDW: 11.9 % (ref 11.5–15.5)
WBC: 6.2 10*3/uL (ref 4.0–10.5)
nRBC: 0 % (ref 0.0–0.2)

## 2018-03-25 NOTE — Progress Notes (Signed)
[place]  [phone] [phone]   ID: Erin [name] DOB: [birth year] MR#: [id number] CSN#: [id number]     Patient Care Team: Erin Koch, MD as PCP - General (Internal Medicine) Erin [name], Erin Dad, MD as Consulting Physician (Oncology) Erin Bookbinder, MD as Consulting Physician (General Surgery) Erin Bruins, MD as Consulting Physician (Obstetrics and Gynecology) Erin Luria, MD as Attending Physician (Dermatology) Erin [name], Erin Lines, MD as Consulting Physician (Gastroenterology) Erin Koh, MD as Consulting Physician (Radiation Oncology) Erin Puna, MD as Referring Physician (Radiation Oncology) Erin Age, MD as Attending Physician (Neurology) Erin [name], DDS as Referring Physician (Dentistry) Erin Pulley, DO as Consulting Physician (Family Medicine) Erin [name]  CHIEF COMPLAINT: Recurrent breast cancer  CURRENT THERAPY: anastrozole  INTERVAL HISTORY: Erin [name] returns today for follow-up of her recurrent breast cancer.   The patient continues on anastrozole, which she is tolerating well. She has some hot flashes, but nothing like she had after her oophorectomy. She is not having vaginal dryness.   The patient also continues on denosumab/Prolia, which she is tolerating well. She is concerned about her bone density loss numbers going down if she discontinues this treatment.  She is taking vitamin D to help bone density treatment.  Since her last visit here, she underwent a bone density screening on 01/30/2018, showed a T-score of +0.1, which is considered normal.   She also underwent a pelvis MRI with and without contrast on 03/18/2018, showing an unchanged sclerotic lesion in the right ischial tuberosity. There are no new or acute osseous findings.     REVIEW OF SYSTEMS: Erin [name] is doing well overall. She is having trouble with her right eye, where it is draining and feels "grainy." It began when  she was last sick, but it has not gone away. She is taking eye drops for treatment. She had a sore right of the outside edge of her right eye. She is also seeing floaters. For exercise, she walks a lot, which can range from 20 minutes to an hour and a half depending on the weather. She also uses the elliptical at home. At her last colonoscopy Erin [name] stated that there was more "gook" in there than normal. The patient wanted to confirm that her breast cancer treatment would not interfere with any recommendations from Erin [name]. The patient denies unusual headaches, nausea, vomiting, or dizziness. There has been no unusual cough, phlegm production, or pleurisy. This been no change in bowel or bladder habits. The patient denies unexplained fatigue or unexplained weight loss, bleeding, rash, or fever. A detailed review of systems was otherwise noncontributory.    BREAST CANCER HISTORY From the original intake note 04/07/2011:  Erin [name] had routine screening mammography December of 2009 at the [facility] in [place], [place], showing an increasing asymmetry in her left breast. She was recalled for additional views December 29. She was noted to have heterogeneously dense breasts. It was a 5 cm area of architectural distortion in the lateral aspect of the left breast with no associated calcifications. There was a second lesion medial to this. Ultrasound showed a highly suspicious hypoechoic irregularly marginated mass measuring 3 cm at the 2:30 position 5 cm from the nipple. This was palpable to the mammographer. The second area in question I measured 7 mm and a third lesion was noted measuring 5 mm. Some left axillary lymph nodes were morphologically normal.  Biopsy of these 3 lesions 04/23/2008 showed 2 of them to be invasive  ductal carcinoma, both grade 1, both strongly estrogen and progesterone receptor positive (at 99/100%), both negative on Herceptest. Bilateral breast MRIs were performed  05/04/2008 and showed, in the left breast, a large lobulated mass measuring up to 4.3 cm, and including both of the apparently separate masses the previously biopsied. In the right breast there were 2 indeterminate lesions. These were evaluated further with breast specific gamma imaging performed January 14 in both lesions were negative. The lesion in the left breast was markedly abnormal.  Given this complex history and with the background of significant breast density, the patient opted for bilateral mastectomies with left sentinel lymph node sampling. This was performed of 06/27/2008 and showed([id number]. [id number]) in the right breast, no malignancy. In the left breast there was a 3.2 cm invasive ductal carcinoma, grade 1, focally extending to the anterior margin of the lower outer quadrant. There was extensive angiolymphatic invasion, but both sentinel lymph nodes on the left were negative.  The patient received left-sided postmastectomy radiation including of the left chest wall and left supraclavicular fossa to a total dose of 50.4 Gy plus a 10 Gy scar boost. She had an Oncotype DX recurrence score of 17, further discussed below. She decided to forego reconstruction. She was tested for BRCA1 and 2 and was found to be negative. Given her overall prognosis, she did not receive chemotherapy, but started tamoxifen February of 2010, with good tolerance.  RECURRENT DISEASE: SUMMARY OF INITIAL PRESENTATION  Erin [name] took tamoxifen for 5 years with no evidence of active disease area did in May 2015 we decided to stop the tamoxifen because of problems with endometrial hyperplasia. 6 months later, at the November visit, she was found to have a change in a small area of scaring her left inframammary fold. She was referred to dermatology and biopsy 04/21/2014 showed ([id number]) recurrent ductal adenocarcinoma (positive for gross cystic disease fluid protein, estrogen, and progesterone). She then underwent a staging PET  scan in [facility] 05/06/2014. This showed a 1.9 cm sclerotic lesion in the right ischial tuberosity, with a maximum standard uptake value of 4.6. A sclerotic lesion in the left side of the L3 vertebral body measuring 1.6 cm had no hypermetabolic activity.  Her subsequent history is as detailed below.   Past Medical History:  Diagnosis Date  . Allergic rhinitis due to pollen   . Breast cancer ([facility]) 2010  . Breast cancer ([place]) 2016   reoccurrence in scar  . Hypertension   . Hypothyroidism   . Osteopenia   . S/P radiation therapy 07/12/2014 through 08/16/2014    Left chest wall 4000 cGy in 20 sessions, left chest wall boost 1000 cGy in 5 sessions  . Wears glasses   Diverticulosis       Past Surgical History:  Procedure Laterality Date  . BREAST LUMPECTOMY Left 06/01/2014   Procedure: WIDE LOCAL EXCISION OF RECURRENT LEFT BREAST CANCER;  Surgeon: Erin Bookbinder, MD;  Location: [facility];  Service: General;  Laterality: Left;  . COLONOSCOPY    . cyst removed     left lower jaw - at [name] 44 yr  . DILATATION & CURRETTAGE/HYSTEROSCOPY WITH RESECTOCOPE N/A 09/16/2013   Procedure: DILATATION & CURETTAGE/HYSTEROSCOPY WITH RESECTOCOPE;  Surgeon: Erin Bruins, MD;  Location: [facility];  Service: Gynecology;  Laterality: N/A;  1 hr.  . INGUINAL HERNIA REPAIR     right side as infant  . LAPAROSCOPIC PELVIC LYMPH NODE BIOPSY    . MASTECTOMY  2010   bilateral  mastectomies-lt snbx-radiation post  . MASTECTOMY Bilateral   . OOPHORECTOMY  2016   patient denies  . Right knee arthroscopy  2008  . ROBOTIC ASSISTED BILATERAL SALPINGO OOPHERECTOMY Bilateral 03/13/2015   Procedure: ROBOTIC ASSISTED BILATERAL SALPINGO OOPHORECTOMY With Washings;  Surgeon: Erin Bruins, MD;  Location: [facility];  Service: Gynecology;  Laterality: Bilateral;  . TONSILLECTOMY    . WISDOM TOOTH EXTRACTION      FAMILY HISTORY The patient's  mother died in 8882 from complications of lung cancer. The patient has not been in touch with her father her for approximately 30 years. She had no sisters, 1 brother, who is in good health. There is no breast or ovarian cancer in the family to her knowledge.   GYNECOLOGIC HISTORY:  GX P0, menarche at around [name] 59, the patient continued to have periods despite being on tamoxifen, although more irregularly. 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. She lives alone and has no pets. Her work involves quite a bit of travel.     ADVANCED DIRECTIVES: not in place  HEALTH MAINTENANCE:      Social History   Tobacco Use  . Smoking status: Never Smoker  . Smokeless tobacco: Never Used  Substance Use Topics  . Alcohol use: No    Alcohol/week: 0.0 standard drinks    Frequency: Never    Comment: rare  . Drug use: No      Colonoscopy: December 2013 [facility])  PAP: Feb 2011  Bone density: 06/2009, normal  Cholesterol: "good"      Allergies  Allergen Reactions  . Pollen Extract   . Adhesive [Tape] Rash    MEDICATIONS:    Current Outpatient Medications  Medication Sig Dispense Refill  . acetaminophen (TYLENOL) 500 MG tablet Take 500 mg by mouth every 6 (six) hours as needed for moderate pain or headache.    Marland Kitchen acyclovir ointment (ZOVIRAX) 5 % Apply 1 application topically every 3 (three) hours. As needed 5 g 5  . anastrozole (ARIMIDEX) 1 MG tablet TAKE 1 TABLET(1 MG) BY MOUTH DAILY 90 tablet 0  . Cholecalciferol (VITAMIN D3) 2000 UNITS capsule Take 2,000 Units by mouth daily.      Marland Kitchen denosumab (PROLIA) 60 MG/ML SOLN injection Inject 60 mg into the skin every 6 (six) months. Administer in upper arm, thigh, or abdomen    . Diclofenac Sodium (PENNSAID) 2 % SOLN Place 2 g onto the skin 2 (two) times daily. 112 g 3  . ibuprofen (ADVIL,MOTRIN) 200 MG tablet Take 200 mg by mouth every 6 (six) hours as needed for moderate pain.    Marland Kitchen levothyroxine  (SYNTHROID, LEVOTHROID) 75 MCG tablet TAKE 1 TABLET(75 MCG) BY MOUTH DAILY BEFORE BREAKFAST 90 tablet 1  . loratadine (CLARITIN) 10 MG tablet Take 10 mg by mouth daily as needed for allergies.     Marland Kitchen losartan (COZAAR) 100 MG tablet TAKE 1 TABLET(100 MG) BY MOUTH DAILY 90 tablet 0  . Polyethyl Glycol-Propyl Glycol (SYSTANE OP) Apply 1 drop to eye as needed (for allergies).    . sodium chloride (OCEAN) 0.65 % SOLN nasal spray Place 1 spray into both nostrils as needed for congestion.    . Turmeric 500 MG CAPS Take 500 mg by mouth 2 (two) times daily.    Marland Kitchen VITAMIN D PO Take 1 tablet by mouth daily.     No current facility-administered medications for this visit.     OBJECTIVE:  Middle-aged white woman appears well  Vitals:   03/26/18 1034  BP: 124/71  Pulse: 90  Resp: 18  Temp: 98.1 F (36.7 C)  SpO2: 99%     Body mass index is 26.24 kg/m.   ECOG FS: 1 - Symptomatic but completely ambulatory  Sclerae unicteric, pupils round and equal Oropharynx clear and moist No cervical or supraclavicular adenopathy Lungs no rales or rhonchi Heart regular rate and rhythm Abd soft, nontender, positive bowel sounds MSK no focal spinal tenderness, no upper extremity lymphedema Neuro: nonfocal, well oriented, appropriate affect Breasts: Status post bilateral mastectomies.  The right chest wall is entirely unremarkable.  The left chest wall does not show any evidence of disease recurrence.  It does show some change and this is imaged below.  I think what we are seeing is post treatment changes but I do not have a simple explanation for the altered appearance of the left chest wall area  Left chest wall 03/26/2018      LABS:           Chemistry      Component Value Date/Time   NA 138 03/20/2018 1453   NA 139 02/28/2017 1521   K 4.6 03/20/2018 1453   K 4.6 02/28/2017 1521   CL 101 03/20/2018 1453   CL 105 08/10/2012 0944   CO2 28 03/20/2018 1453   CO2 29 02/28/2017 1521   BUN 22 (H)  03/20/2018 1453   BUN 20.0 02/28/2017 1521   CREATININE 0.99 03/20/2018 1453   CREATININE 1.0 02/28/2017 1521      Component Value Date/Time   CALCIUM 10.0 03/20/2018 1453   CALCIUM 10.4 02/28/2017 1521   ALKPHOS 79 03/20/2018 1453   ALKPHOS 62 02/28/2017 1521   AST 17 03/20/2018 1453   AST 18 02/28/2017 1521   ALT 11 03/20/2018 1453   ALT 13 02/28/2017 1521   BILITOT 1.2 03/20/2018 1453   BILITOT 1.17 02/28/2017 1521         Lab Results  Component Value Date   WBC 6.2 03/20/2018   HGB 15.0 03/20/2018   HCT 45.3 03/20/2018   MCV 87.3 03/20/2018   PLT 240 03/20/2018   NEUTROABS 3.7 03/20/2018    STUDIES  Mr Pelvis W Wo Contrast  Result Date: 03/19/2018 CLINICAL DATA:  Follow up bone lesions. History of breast cancer. Evaluate for metastatic disease. EXAM: MRI PELVIS WITHOUT AND WITH CONTRAST TECHNIQUE: Multiplanar multisequence MR imaging of the pelvis was performed both before and after administration of intravenous contrast. CONTRAST:  7 cc Gadavist COMPARISON:  Whole-body bone scan 12/20/2014, pelvic CT 04/19/2015 and pelvic MRI 05/20/2014, 04/13/2016 and 03/21/2017. FINDINGS: Urinary Tract: The visualized distal ureters and bladder appear unremarkable. Bowel: No bowel wall thickening, distention or surrounding inflammation identified within the pelvis. Vascular/Lymphatic: No enlarged pelvic lymph nodes identified. No significant vascular findings. Reproductive: The uterus and ovaries appear normal. No adnexal mass. Erin: No pelvic ascites. Musculoskeletal: Chronic sclerotic lesion involving the right ischial tuberosity along the superior aspect of the common hamstring origin is stable in size from the baseline study, measuring 2.0 x 1.8 cm on image 36/3. Minimal T2 hyperintensity and enhancement in this area are unchanged. There are no new or enlarging osseous lesions. The hip joints appear normal. There are stable endplate degenerative changes in the lumbar spine. The pelvic  muscles and tendons appear normal. IMPRESSION: 1. Unchanged sclerotic lesion in the right ischial tuberosity. 2. No new or acute osseous findings. Electronically Signed   By: Richardean Sale M.D.   On:  03/19/2018 11:49    ASSESSMENT: 57 y.o.  BRCA negative [place] woman ,  (1) status post bilateral mastectomies January 2010 for a left-sided upper outer quadrant T2 N0 (stage IIA) invasive ductal carcinoma, grade 1, strongly estrogen and progesterone receptor positive, HER-2 nonamplified,   (2) Oncotype DX recurrence score of 17, predicting a 10-12% distant recurrence risk over 10 years if her only adjuvant treatment is tamoxifen for 5 years  (3) status post Left postmastectomy radiation including Left supraclavicular fossa (5040 cGy in 28 sessions)  (4) on tamoxifen starting February of 2010, stopped May 2015 because of endometrial hyperplasia  (5) osteopenia-- T score -1.2 on repeated bone density scans 09/24/2011 and 12/12/2013   RECURRENT DISEASE (6) skin biopsy of a lesion under the left breast scar 04/21/2014 shows adenocarcinoma, gross cystic disease fluid protein, estrogen and progesterone receptor positive  (a) s/p wide excision with negative though close margins 06/01/2014, prognostic panel pending  (b) radiation to Left chest wall 4000 cGy in 20 sessions, left schest wall boost 1000 cGy in 5 session 07/12/2014 through 08/16/2014  (7) PET scan and bone scan show only one additional area of concern, in the right ischiial tuberosity--  (a) biopsy 05/24/2014 showed no evidence of malignancy  (b) zolendronate started 06/06/2014, poorly tolerated  (c) denosumab/ Xgeva started 09/26/2014, switched to Prolia as of May 2017  (d) repeat bone scan 12/20/2014 shows no new areas of disease, and the previously biopsied area to be delivered  (e) restaging studies 07/07/2015 do not support a diagnosis of metastatic disease.  (f) MRI of the pelvis 04/13/2016 shows the 2.2 lesion in the  right is she'll tuberosity to be unchanged  (8) goserelin monthly started 05/09/2016, last dose 02/20/2015  (a) s/p BSO 03/13/2015 with benign pathology  (9) anastrozole started  08/29/2014  (a) bone density 12/13/2015 shows improvement, with the lumbar spine T score now at 0.1, the femoral neck -1.1, both increased  (b) continues on denosumab/Prolia most recent dose 09/05/2017  (c) repeat bone density 01/30/2018 shows a T score of 0.1  (d) denosumab/Prolia discontinued after 09/05/2017 dose   PLAN: Erin [name] is now 4 years out from her disease recurrence and concern regarding bone metastatic disease.  There is no evidence of active disease and this is very favorable.  She is tolerating anastrozole well and the plan will be to continue that a total of 7 years.  I do not have a simple explanation for the change in the appearance of the left chest area.  Clearly this is a muscle issue and not a cancer issue as far as I can tell but we are going to start with an ultrasound just to make sure nothing else is involved and if there is any question whatsoever we will proceed to an MRI of that area  We did review the MRI of the pelvis which is very stable and that is very reassuring.  I think at this point they would be wonderful if Erin [name] took a week or 2 off next year just for fun and we discussed some various treatment options for her  Given the very stable bone density I am comfortable stopping her denosumab/Prolia.  She has an excellent walking program and will continue on vitamin D supplementation  I expect the right eye tearing will have cleared by the time she sees her eye doctor in January.  She will see me again in May.  She knows to call for any Erin issues that may develop before the next  visit.     Vernell Back, Erin Dad, MD  03/26/18 11:23 AM Medical Oncology and Hematology [facility] [address] [place], [place] [address] Tel. [phone]    Fax.  [phone]    I, Jacqualyn Posey am acting as a Education administrator for Chauncey Cruel, MD.   I, Lurline Del MD, have reviewed the above documentation for accuracy and completeness, and I agree with the above.

## 2018-03-26 ENCOUNTER — Telehealth: Payer: Self-pay | Admitting: Oncology

## 2018-03-26 ENCOUNTER — Inpatient Hospital Stay: Payer: BC Managed Care – PPO | Attending: Oncology | Admitting: Oncology

## 2018-03-26 VITALS — BP 124/71 | HR 90 | Temp 98.1°F | Resp 18 | Ht 65.0 in | Wt 157.7 lb

## 2018-03-26 DIAGNOSIS — Z79811 Long term (current) use of aromatase inhibitors: Secondary | ICD-10-CM | POA: Insufficient documentation

## 2018-03-26 DIAGNOSIS — C50412 Malignant neoplasm of upper-outer quadrant of left female breast: Secondary | ICD-10-CM | POA: Insufficient documentation

## 2018-03-26 DIAGNOSIS — Z9013 Acquired absence of bilateral breasts and nipples: Secondary | ICD-10-CM | POA: Insufficient documentation

## 2018-03-26 DIAGNOSIS — C50912 Malignant neoplasm of unspecified site of left female breast: Secondary | ICD-10-CM | POA: Diagnosis present

## 2018-03-26 DIAGNOSIS — Z170 Estrogen receptor positive status [ER+]: Secondary | ICD-10-CM | POA: Insufficient documentation

## 2018-03-26 NOTE — Telephone Encounter (Signed)
Gave avs and calendar ° °

## 2018-03-31 ENCOUNTER — Other Ambulatory Visit: Payer: Self-pay | Admitting: *Deleted

## 2018-04-08 ENCOUNTER — Other Ambulatory Visit: Payer: Self-pay | Admitting: Oncology

## 2018-04-21 ENCOUNTER — Encounter: Payer: Self-pay | Admitting: Internal Medicine

## 2018-04-21 ENCOUNTER — Other Ambulatory Visit (INDEPENDENT_AMBULATORY_CARE_PROVIDER_SITE_OTHER): Payer: BC Managed Care – PPO

## 2018-04-21 ENCOUNTER — Ambulatory Visit (INDEPENDENT_AMBULATORY_CARE_PROVIDER_SITE_OTHER): Payer: BC Managed Care – PPO | Admitting: Internal Medicine

## 2018-04-21 VITALS — BP 120/80 | HR 84 | Temp 97.8°F | Ht 65.0 in | Wt 160.0 lb

## 2018-04-21 DIAGNOSIS — Z0000 Encounter for general adult medical examination without abnormal findings: Secondary | ICD-10-CM

## 2018-04-21 DIAGNOSIS — E038 Other specified hypothyroidism: Secondary | ICD-10-CM

## 2018-04-21 DIAGNOSIS — I10 Essential (primary) hypertension: Secondary | ICD-10-CM | POA: Diagnosis not present

## 2018-04-21 LAB — VITAMIN D 25 HYDROXY (VIT D DEFICIENCY, FRACTURES): VITD: 38.51 ng/mL (ref 30.00–100.00)

## 2018-04-21 LAB — LIPID PANEL
Cholesterol: 158 mg/dL (ref 0–200)
HDL: 60.8 mg/dL (ref >39.00)
LDL Cholesterol: 78 mg/dL (ref 0–99)
NonHDL: 96.79
Total CHOL/HDL Ratio: 3
Triglycerides: 94 mg/dL (ref 0.0–149.0)
VLDL: 18.8 mg/dL (ref 0.0–40.0)

## 2018-04-21 LAB — T4, FREE: FREE T4: 1.18 ng/dL (ref 0.60–1.60)

## 2018-04-21 LAB — TSH: TSH: 1.44 u[IU]/mL (ref 0.35–4.50)

## 2018-04-21 MED ORDER — LEVOTHYROXINE SODIUM 75 MCG PO TABS: ORAL_TABLET | ORAL | 3 refills | Status: DC

## 2018-04-21 MED ORDER — LOSARTAN POTASSIUM 100 MG PO TABS: 100.0000 mg | ORAL_TABLET | Freq: Every day | ORAL | 3 refills | Status: DC

## 2018-04-21 MED ORDER — ACYCLOVIR 5 % EX OINT: 1.0000 "application " | TOPICAL_OINTMENT | CUTANEOUS | 5 refills | Status: DC

## 2018-04-21 NOTE — Assessment & Plan Note (Signed)
Flu shot up to date. Shingrix complete. Tetanus up to date. Colonoscopy will order cologuard when she is ready. Mammogram up to date, pap smear up to date and dexa up to date. Counseled about sun safety and mole surveillance. Counseled about the dangers of distracted driving. Given 10 year screening recommendations.

## 2018-04-21 NOTE — Assessment & Plan Note (Signed)
BP at goal on losartan 100 mg daily. Recent CMP without indication for change.

## 2018-04-21 NOTE — Progress Notes (Signed)
   Subjective:   Patient ID: Erin [name], female    DOB: 07-Aug-1960, 57 y.o.   MRN: [id number]  HPI The patient is a 57 YO female coming in for physical.   PMH, [facility], social history reviewed and updated.   Review of Systems  Constitutional: Negative.   HENT: Negative.   Eyes: Negative.   Respiratory: Negative for cough, chest tightness and shortness of breath.   Cardiovascular: Negative for chest pain, palpitations and leg swelling.  Gastrointestinal: Negative for abdominal distention, abdominal pain, constipation, diarrhea, nausea and vomiting.  Musculoskeletal: Negative.   Skin: Negative.   Neurological: Negative.   Psychiatric/Behavioral: Negative.     Objective:  Physical Exam Constitutional:      Appearance: She is well-developed.  HENT:     Head: Normocephalic and atraumatic.  Neck:     Musculoskeletal: Normal range of motion.  Cardiovascular:     Rate and Rhythm: Normal rate and regular rhythm.  Pulmonary:     Effort: Pulmonary effort is normal. No respiratory distress.     Breath sounds: Normal breath sounds. No wheezing or rales.  Abdominal:     General: Bowel sounds are normal. There is no distension.     Palpations: Abdomen is soft.     Tenderness: There is no abdominal tenderness. There is no rebound.  Skin:    General: Skin is warm and dry.  Neurological:     Mental Status: She is alert and oriented to person, place, and time.     Coordination: Coordination normal.     Vitals:   04/21/18 0931  BP: 120/80  Pulse: 84  Temp: 97.8 F (36.6 C)  TempSrc: Oral  SpO2: 98%  Weight: 160 lb (72.6 kg)  Height: 5\' 5"  (1.651 m)    Assessment & Plan:

## 2018-04-21 NOTE — Patient Instructions (Signed)
We will check the labs today and send you the results.   Health Maintenance, Female Adopting a healthy lifestyle and getting preventive care can go a long way to promote health and wellness. Talk with your health care provider about what schedule of regular examinations is right for you. This is a good chance for you to check in with your provider about disease prevention and staying healthy. In between checkups, there are plenty of things you can do on your own. Experts have done a lot of research about which lifestyle changes and preventive measures are most likely to keep you healthy. Ask your health care provider for more information. Weight and diet Eat a healthy diet  Be sure to include plenty of vegetables, fruits, low-fat dairy products, and lean protein.  Do not eat a lot of foods high in solid fats, added sugars, or salt.  Get regular exercise. This is one of the most important things you can do for your health. ? Most adults should exercise for at least 150 minutes each week. The exercise should increase your heart rate and make you sweat (moderate-intensity exercise). ? Most adults should also do strengthening exercises at least twice a week. This is in addition to the moderate-intensity exercise. Maintain a healthy weight  Body mass index (BMI) is a measurement that can be used to identify possible weight problems. It estimates body fat based on height and weight. Your health care provider can help determine your BMI and help you achieve or maintain a healthy weight.  For females 75 years of age and older: ? A BMI below 18.5 is considered underweight. ? A BMI of 18.5 to 24.9 is normal. ? A BMI of 25 to 29.9 is considered overweight. ? A BMI of 30 and above is considered obese. Watch levels of cholesterol and blood lipids  You should start having your blood tested for lipids and cholesterol at 56 years of age, then have this test every 5 years.  You may need to have your  cholesterol levels checked more often if: ? Your lipid or cholesterol levels are high. ? You are older than 56 years of age. ? You are at high risk for heart disease. Cancer screening Lung Cancer  Lung cancer screening is recommended for adults 56-48 years old who are at high risk for lung cancer because of a history of smoking.  A yearly low-dose CT scan of the lungs is recommended for people who: ? Currently smoke. ? Have quit within the past 15 years. ? Have at least a 30-pack-year history of smoking. A pack year is smoking an average of one pack of cigarettes a day for 1 year.  Yearly screening should continue until it has been 15 years since you quit.  Yearly screening should stop if you develop a health problem that would prevent you from having lung cancer treatment. Breast Cancer  Practice breast self-awareness. This means understanding how your breasts normally appear and feel.  It also means doing regular breast self-exams. Let your health care provider know about any changes, no matter how small.  If you are in your 20s or 30s, you should have a clinical breast exam (CBE) by a health care provider every 1-3 years as part of a regular health exam.  If you are 56 or older, have a CBE every year. Also consider having a breast X-ray (mammogram) every year.  If you have a family history of breast cancer, talk to your health care provider about  genetic screening.  If you are at high risk for breast cancer, talk to your health care provider about having an MRI and a mammogram every year.  Breast cancer gene (BRCA) assessment is recommended for women who have family members with BRCA-related cancers. BRCA-related cancers include: ? Breast. ? Ovarian. ? Tubal. ? Peritoneal cancers.  Results of the assessment will determine the need for genetic counseling and BRCA1 and BRCA2 testing. Cervical Cancer Your health care provider may recommend that you be screened regularly for  cancer of the pelvic organs (ovaries, uterus, and vagina). This screening involves a pelvic examination, including checking for microscopic changes to the surface of your cervix (Pap test). You may be encouraged to have this screening done every 3 years, beginning at age 56.  For women ages 10-65, health care providers may recommend pelvic exams and Pap testing every 3 years, or they may recommend the Pap and pelvic exam, combined with testing for human papilloma virus (HPV), every 5 years. Some types of HPV increase your risk of cervical cancer. Testing for HPV may also be done on women of any age with unclear Pap test results.  Other health care providers may not recommend any screening for nonpregnant women who are considered low risk for pelvic cancer and who do not have symptoms. Ask your health care provider if a screening pelvic exam is right for you.  If you have had past treatment for cervical cancer or a condition that could lead to cancer, you need Pap tests and screening for cancer for at least 20 years after your treatment. If Pap tests have been discontinued, your risk factors (such as having a new sexual partner) need to be reassessed to determine if screening should resume. Some women have medical problems that increase the chance of getting cervical cancer. In these cases, your health care provider may recommend more frequent screening and Pap tests. Colorectal Cancer  This type of cancer can be detected and often prevented.  Routine colorectal cancer screening usually begins at 56 years of age and continues through 56 years of age.  Your health care provider may recommend screening at an earlier age if you have risk factors for colon cancer.  Your health care provider may also recommend using home test kits to check for hidden blood in the stool.  A small camera at the end of a tube can be used to examine your colon directly (sigmoidoscopy or colonoscopy). This is done to check for  the earliest forms of colorectal cancer.  Routine screening usually begins at age 56.  Direct examination of the colon should be repeated every 5-10 years through 56 years of age. However, you may need to be screened more often if early forms of precancerous polyps or small growths are found. Skin Cancer  Check your skin from head to toe regularly.  Tell your health care provider about any new moles or changes in moles, especially if there is a change in a mole's shape or color.  Also tell your health care provider if you have a mole that is larger than the size of a pencil eraser.  Always use sunscreen. Apply sunscreen liberally and repeatedly throughout the day.  Protect yourself by wearing long sleeves, pants, a wide-brimmed hat, and sunglasses whenever you are outside. Heart disease, diabetes, and high blood pressure  High blood pressure causes heart disease and increases the risk of stroke. High blood pressure is more likely to develop in: ? People who have blood pressure  in the high end of the normal range (130-139/85-89 mm Hg). ? People who are overweight or obese. ? People who are African American.  If you are 60-44 years of age, have your blood pressure checked every 3-5 years. If you are 22 years of age or older, have your blood pressure checked every year. You should have your blood pressure measured twice-once when you are at a hospital or clinic, and once when you are not at a hospital or clinic. Record the average of the two measurements. To check your blood pressure when you are not at a hospital or clinic, you can use: ? An automated blood pressure machine at a pharmacy. ? A home blood pressure monitor.  If you are between 75 years and 33 years old, ask your health care provider if you should take aspirin to prevent strokes.  Have regular diabetes screenings. This involves taking a blood sample to check your fasting blood sugar level. ? If you are at a normal weight and  have a low risk for diabetes, have this test once every three years after 56 years of age. ? If you are overweight and have a high risk for diabetes, consider being tested at a younger age or more often. Preventing infection Hepatitis B  If you have a higher risk for hepatitis B, you should be screened for this virus. You are considered at high risk for hepatitis B if: ? You were born in a country where hepatitis B is common. Ask your health care provider which countries are considered high risk. ? Your parents were born in a high-risk country, and you have not been immunized against hepatitis B (hepatitis B vaccine). ? You have HIV or AIDS. ? You use needles to inject street drugs. ? You live with someone who has hepatitis B. ? You have had sex with someone who has hepatitis B. ? You get hemodialysis treatment. ? You take certain medicines for conditions, including cancer, organ transplantation, and autoimmune conditions. Hepatitis C  Blood testing is recommended for: ? Everyone born from [birth year] through 1964. ? Anyone with known risk factors for hepatitis C. Sexually transmitted infections (STIs)  You should be screened for sexually transmitted infections (STIs) including gonorrhea and chlamydia if: ? You are sexually active and are younger than 56 years of age. ? You are older than 56 years of age and your health care provider tells you that you are at risk for this type of infection. ? Your sexual activity has changed since you were last screened and you are at an increased risk for chlamydia or gonorrhea. Ask your health care provider if you are at risk.  If you do not have HIV, but are at risk, it may be recommended that you take a prescription medicine daily to prevent HIV infection. This is called pre-exposure prophylaxis (PrEP). You are considered at risk if: ? You are sexually active and do not regularly use condoms or know the HIV status of your partner(s). ? You take drugs by  injection. ? You are sexually active with a partner who has HIV. Talk with your health care provider about whether you are at high risk of being infected with HIV. If you choose to begin PrEP, you should first be tested for HIV. You should then be tested every 3 months for as long as you are taking PrEP. Pregnancy  If you are premenopausal and you may become pregnant, ask your health care provider about preconception counseling.  If you  may become pregnant, take 400 to 800 micrograms (mcg) of folic acid every day.  If you want to prevent pregnancy, talk to your health care provider about birth control (contraception). Osteoporosis and menopause  Osteoporosis is a disease in which the bones lose minerals and strength with aging. This can result in serious bone fractures. Your risk for osteoporosis can be identified using a bone density scan.  If you are 66 years of age or older, or if you are at risk for osteoporosis and fractures, ask your health care provider if you should be screened.  Ask your health care provider whether you should take a calcium or vitamin D supplement to lower your risk for osteoporosis.  Menopause may have certain physical symptoms and risks.  Hormone replacement therapy may reduce some of these symptoms and risks. Talk to your health care provider about whether hormone replacement therapy is right for you. Follow these instructions at home:  Schedule regular health, dental, and eye exams.  Stay current with your immunizations.  Do not use any tobacco products including cigarettes, chewing tobacco, or electronic cigarettes.  If you are pregnant, do not drink alcohol.  If you are breastfeeding, limit how much and how often you drink alcohol.  Limit alcohol intake to no more than 1 drink per day for nonpregnant women. One drink equals 12 ounces of beer, 5 ounces of wine, or 1 ounces of hard liquor.  Do not use street drugs.  Do not share needles.  Ask  your health care provider for help if you need support or information about quitting drugs.  Tell your health care provider if you often feel depressed.  Tell your health care provider if you have ever been abused or do not feel safe at home. This information is not intended to replace advice given to you by your health care provider. Make sure you discuss any questions you have with your health care provider. Document Released: 10/22/2010 Document Revised: 09/14/2015 Document Reviewed: 01/10/2015 Elsevier Interactive Patient Education  2019 [employer].

## 2018-04-21 NOTE — Assessment & Plan Note (Signed)
Checking TSH and free T4 and adjust synthroid 75 mcg daily as needed.  

## 2018-04-23 LAB — RUBEOLA ANTIBODY IGG: Rubeola IgG: 13.6 AU/mL — ABNORMAL LOW

## 2018-05-15 ENCOUNTER — Encounter: Payer: Self-pay | Admitting: Internal Medicine

## 2018-05-29 ENCOUNTER — Ambulatory Visit: Payer: BC Managed Care – PPO

## 2018-06-19 ENCOUNTER — Ambulatory Visit (INDEPENDENT_AMBULATORY_CARE_PROVIDER_SITE_OTHER): Payer: BC Managed Care – PPO | Admitting: *Deleted

## 2018-06-19 DIAGNOSIS — Z23 Encounter for immunization: Secondary | ICD-10-CM

## 2018-06-19 NOTE — Progress Notes (Deleted)
Need MD to signed

## 2018-06-23 DIAGNOSIS — Z23 Encounter for immunization: Secondary | ICD-10-CM | POA: Insufficient documentation

## 2018-07-01 ENCOUNTER — Other Ambulatory Visit: Payer: Self-pay | Admitting: Oncology

## 2018-07-02 ENCOUNTER — Other Ambulatory Visit: Payer: Self-pay

## 2018-07-02 ENCOUNTER — Encounter: Payer: Self-pay | Admitting: Internal Medicine

## 2018-07-02 ENCOUNTER — Ambulatory Visit: Payer: BC Managed Care – PPO | Admitting: Internal Medicine

## 2018-07-02 VITALS — BP 124/70 | HR 82 | Temp 98.0°F | Ht 65.0 in | Wt 162.0 lb

## 2018-07-02 DIAGNOSIS — J0110 Acute frontal sinusitis, unspecified: Secondary | ICD-10-CM

## 2018-07-02 DIAGNOSIS — Z1211 Encounter for screening for malignant neoplasm of colon: Secondary | ICD-10-CM

## 2018-07-02 DIAGNOSIS — E038 Other specified hypothyroidism: Secondary | ICD-10-CM | POA: Diagnosis not present

## 2018-07-02 DIAGNOSIS — J3089 Other allergic rhinitis: Secondary | ICD-10-CM | POA: Diagnosis not present

## 2018-07-02 MED ORDER — AMOXICILLIN-POT CLAVULANATE 875-125 MG PO TABS: 1.0000 | ORAL_TABLET | Freq: Two times a day (BID) | ORAL | 0 refills | Status: DC

## 2018-07-02 NOTE — Telephone Encounter (Signed)
Anastrozole started 08-29-2014 -> Continue 7 yrs.

## 2018-07-02 NOTE — Progress Notes (Signed)
   Subjective:   Patient ID: Erin [name], female    DOB: 1960-12-06, 58 y.o.   MRN: [id number]  HPI The patient is a 58 YO female coming in for sinus problems. Started 1-2 months ago. Denies fevers or chills. Is taking claritin. Used to get allergy shots when she lived in [place] and wonders if she should get these again. She has pain in sinuses, ear congestion and drainage. Overall worsening. She has occasionally taken aleve cold and sinus which helps temporarily with the pressure.   Review of Systems  Constitutional: Negative for activity change, appetite change, chills, fatigue, fever and unexpected weight change.  HENT: Positive for congestion, ear pain, postnasal drip, rhinorrhea, sinus pressure and sinus pain. Negative for ear discharge, sneezing, sore throat, tinnitus, trouble swallowing and voice change.   Eyes: Negative.   Respiratory: Negative for cough, chest tightness, shortness of breath and wheezing.   Cardiovascular: Negative.   Gastrointestinal: Negative.   Musculoskeletal: Negative.   Neurological: Negative.     Objective:  Physical Exam Constitutional:      Appearance: She is well-developed.  HENT:     Head: Normocephalic and atraumatic.     Comments: Oropharynx with redness and clear drainage, nose with swollen turbinates, TMs normal bilaterally. Sinus pain frontal and temporal Neck:     Musculoskeletal: Normal range of motion.     Thyroid: No thyromegaly.  Cardiovascular:     Rate and Rhythm: Normal rate and regular rhythm.  Pulmonary:     Effort: Pulmonary effort is normal. No respiratory distress.     Breath sounds: Normal breath sounds. No wheezing or rales.  Abdominal:     Palpations: Abdomen is soft.  Musculoskeletal:        General: Tenderness present.  Lymphadenopathy:     Cervical: No cervical adenopathy.  Skin:    General: Skin is warm and dry.  Neurological:     Mental Status: She is alert and oriented to person, place, and time.      Vitals:   07/02/18 1318  BP: 124/70  Pulse: 82  Temp: 98 F (36.7 C)  TempSrc: Oral  SpO2: 98%  Weight: 162 lb (73.5 kg)  Height: 5\' 5"  (1.651 m)    Assessment & Plan:

## 2018-07-02 NOTE — Patient Instructions (Signed)
We have sent in the augmentin to take 1 pill twice a day.

## 2018-07-02 NOTE — Assessment & Plan Note (Signed)
Rx for augmentin for sinus infection. Continue claritin and referral placed to allergy for possible testing or allergy shots.

## 2018-08-12 ENCOUNTER — Ambulatory Visit: Payer: BC Managed Care – PPO | Admitting: Allergy

## 2018-08-27 ENCOUNTER — Telehealth: Payer: Self-pay | Admitting: Oncology

## 2018-08-27 NOTE — Telephone Encounter (Signed)
Tried to reach regarding reschedule °

## 2018-09-03 ENCOUNTER — Other Ambulatory Visit: Payer: Self-pay

## 2018-09-03 DIAGNOSIS — C50412 Malignant neoplasm of upper-outer quadrant of left female breast: Secondary | ICD-10-CM

## 2018-09-03 DIAGNOSIS — Z170 Estrogen receptor positive status [ER+]: Secondary | ICD-10-CM

## 2018-09-04 ENCOUNTER — Other Ambulatory Visit: Payer: Self-pay

## 2018-09-04 ENCOUNTER — Telehealth: Payer: Self-pay | Admitting: Oncology

## 2018-09-04 ENCOUNTER — Other Ambulatory Visit: Payer: BC Managed Care – PPO

## 2018-09-04 ENCOUNTER — Inpatient Hospital Stay: Payer: BC Managed Care – PPO | Attending: Oncology

## 2018-09-04 DIAGNOSIS — C50912 Malignant neoplasm of unspecified site of left female breast: Secondary | ICD-10-CM | POA: Insufficient documentation

## 2018-09-04 DIAGNOSIS — Z170 Estrogen receptor positive status [ER+]: Secondary | ICD-10-CM

## 2018-09-04 DIAGNOSIS — C50412 Malignant neoplasm of upper-outer quadrant of left female breast: Secondary | ICD-10-CM | POA: Diagnosis present

## 2018-09-04 LAB — CBC WITH DIFFERENTIAL (CANCER CENTER ONLY)
Abs Immature Granulocytes: 0.01 10*3/uL (ref 0.00–0.07)
Basophils Absolute: 0.1 10*3/uL (ref 0.0–0.1)
Basophils Relative: 1 %
Eosinophils Absolute: 0.1 10*3/uL (ref 0.0–0.5)
Eosinophils Relative: 2 %
HCT: 43.7 % (ref 36.0–46.0)
Hemoglobin: 14.9 g/dL (ref 12.0–15.0)
Immature Granulocytes: 0 %
Lymphocytes Relative: 24 %
Lymphs Abs: 1.2 10*3/uL (ref 0.7–4.0)
MCH: 29.1 pg (ref 26.0–34.0)
MCHC: 34.1 g/dL (ref 30.0–36.0)
MCV: 85.4 fL (ref 80.0–100.0)
Monocytes Absolute: 0.5 10*3/uL (ref 0.1–1.0)
Monocytes Relative: 9 %
Neutro Abs: 3.4 10*3/uL (ref 1.7–7.7)
Neutrophils Relative %: 64 %
Platelet Count: 237 10*3/uL (ref 150–400)
RBC: 5.12 MIL/uL — ABNORMAL HIGH (ref 3.87–5.11)
RDW: 11.8 % (ref 11.5–15.5)
WBC Count: 5.2 10*3/uL (ref 4.0–10.5)
nRBC: 0 % (ref 0.0–0.2)

## 2018-09-04 LAB — CMP (CANCER CENTER ONLY)
ALT: 15 U/L (ref 0–44)
AST: 18 U/L (ref 15–41)
Albumin: 4 g/dL (ref 3.5–5.0)
Alkaline Phosphatase: 120 U/L (ref 38–126)
Anion gap: 8 (ref 5–15)
BUN: 15 mg/dL (ref 6–20)
CO2: 24 mmol/L (ref 22–32)
Calcium: 9.6 mg/dL (ref 8.9–10.3)
Chloride: 103 mmol/L (ref 98–111)
Creatinine: 0.89 mg/dL (ref 0.44–1.00)
GFR, Est AFR Am: >60 mL/min (ref >60)
GFR, Estimated: >60 mL/min (ref >60)
Glucose, Bld: 109 mg/dL — ABNORMAL HIGH (ref 70–99)
Potassium: 4.7 mmol/L (ref 3.5–5.1)
Sodium: 135 mmol/L (ref 135–145)
Total Bilirubin: 1 mg/dL (ref 0.3–1.2)
Total Protein: 7.2 g/dL (ref 6.5–8.1)

## 2018-09-04 NOTE — Telephone Encounter (Signed)
Per 5/14 schedule message moved 5/21 f/u to 3:30 as webex. Confirmed w/patient. Patient aware she will receive a f/u up call to set up webex meeting.

## 2018-09-08 ENCOUNTER — Telehealth: Payer: Self-pay | Admitting: Oncology

## 2018-09-08 NOTE — Telephone Encounter (Signed)
Patient called regarding upcoming Webex appointment, patient is notified and e-mail has been sent. °

## 2018-09-10 ENCOUNTER — Inpatient Hospital Stay (HOSPITAL_BASED_OUTPATIENT_CLINIC_OR_DEPARTMENT_OTHER): Payer: BC Managed Care – PPO | Admitting: Oncology

## 2018-09-10 DIAGNOSIS — Z9013 Acquired absence of bilateral breasts and nipples: Secondary | ICD-10-CM

## 2018-09-10 DIAGNOSIS — M8580 Other specified disorders of bone density and structure, unspecified site: Secondary | ICD-10-CM | POA: Diagnosis not present

## 2018-09-10 DIAGNOSIS — E039 Hypothyroidism, unspecified: Secondary | ICD-10-CM

## 2018-09-10 DIAGNOSIS — Z79811 Long term (current) use of aromatase inhibitors: Secondary | ICD-10-CM

## 2018-09-10 DIAGNOSIS — I10 Essential (primary) hypertension: Secondary | ICD-10-CM

## 2018-09-10 DIAGNOSIS — C50412 Malignant neoplasm of upper-outer quadrant of left female breast: Secondary | ICD-10-CM

## 2018-09-10 DIAGNOSIS — C50912 Malignant neoplasm of unspecified site of left female breast: Secondary | ICD-10-CM

## 2018-09-10 DIAGNOSIS — Z170 Estrogen receptor positive status [ER+]: Secondary | ICD-10-CM

## 2018-09-10 DIAGNOSIS — Z923 Personal history of irradiation: Secondary | ICD-10-CM

## 2018-09-10 DIAGNOSIS — Z79899 Other long term (current) drug therapy: Secondary | ICD-10-CM

## 2018-09-10 MED ORDER — ANASTROZOLE 1 MG PO TABS: 1.0000 mg | ORAL_TABLET | Freq: Every day | ORAL | 4 refills | Status: DC

## 2018-09-10 NOTE — Progress Notes (Signed)
Erin [name]  [phone] [phone]   ID: Erin [name] DOB: June 22, 1960 MR#: [id number] CSN#: [id number]     Patient Care Team: Erin Koch, MD as PCP - General (Internal Medicine) Erin [name], Erin Dad, MD as Consulting Physician (Oncology) Erin Bookbinder, MD as Consulting Physician (General Surgery) Erin Bruins, MD as Consulting Physician (Obstetrics and Gynecology) Erin Luria, MD as Attending Physician (Dermatology) Pyrtle, Lajuan Lines, MD as Consulting Physician (Gastroenterology) Erin Koh, MD as Consulting Physician (Radiation Oncology) Erin Puna, MD as Referring Physician (Radiation Oncology) Erin Age, MD as Attending Physician (Neurology) Erin [name], DDS as Referring Physician (Dentistry) Erin Pulley, DO as Consulting Physician (Family Medicine) OTHER MD: Erin [name]  I connected with Erin [name] on 09/10/18 at  3:30 PM EDT by video enabled telemedicine visit and verified that I am speaking with the correct person using two identifiers.   I discussed the limitations, risks, security and privacy concerns of performing an evaluation and management service by telemedicine and the availability of in-person appointments. I also discussed with the patient that there may be a patient responsible charge related to this service. The patient expressed understanding and agreed to proceed.   Other persons participating in the visit and their role in the encounter: Erin [name], scribe   Patient's location: home  Provider's location: [place]    CHIEF COMPLAINT: Recurrent breast cancer  CURRENT THERAPY: anastrozole, denosumab/Prolia   INTERVAL HISTORY: Erin [name] returns today for follow-up of her recurrent breast cancer.   The patient continues on anastrozole, which she is tolerating well. She reports increased hot flashes over the last few months, which she notes may be connected to the  changing seasons.   The patient also continues on denosumab/Prolia, which she is tolerating well.  Her last dose was 09/05/2017.  She continues on vitam D supplements.  At her last visit, a change in the appearance of her left chest area was noted. She proceeded to left breast ultrasound at [facility] on 04/16/2018 showing: no sonographic evidence of malignancy. She reports when she went to see her dermatologist in February, who biopsied the spot. We did not receive records of the pathology, but Erin [name] was able to share the document with me.     REVIEW OF SYSTEMS: Erin [name] reports doing well overall. 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 They will be online over the summer as well. She uses her elliptical for exercise, as well as walking outside when she can. She is continuing on follow up of her thyroid, which has been stable. She states she previously received allergy shots while living in [place], and she will see her allergist in July.  She has a cough which tends to come and go, she thinks likely due to allergies.  The patient denies unusual headaches, visual changes, nausea, vomiting, stiff neck, dizziness, or gait imbalance. There has been  no chest pain or pressure, and no change in bowel or bladder habits. The patient denies fever, rash, bleeding, unexplained fatigue or unexplained weight loss. A detailed review of systems was otherwise entirely negative.   BREAST CANCER HISTORY From the original intake note 04/07/2011:  Erin [name] had routine screening mammography December of 2009 at the [facility] in [place], [place], showing an increasing asymmetry in her left breast. She was recalled for additional views December 29. She was noted to have heterogeneously dense breasts. It was a 5 cm area of architectural distortion in the lateral aspect  of the left breast with no associated calcifications. There was a second lesion medial to this. Ultrasound showed a highly  suspicious hypoechoic irregularly marginated mass measuring 3 cm at the 2:30 position 5 cm from the nipple. This was palpable to the mammographer. The second area in question I measured 7 mm and a third lesion was noted measuring 5 mm. Some left axillary lymph nodes were morphologically normal.  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 Bilateral breast MRIs were performed 05/04/2008 and showed, in the left breast, a large lobulated mass measuring up to 4.3 cm, and including both of the apparently separate masses the previously biopsied. In the right breast there were 2 indeterminate lesions. These were evaluated further with breast specific gamma imaging performed January 14 in both lesions were negative. The lesion in the left breast was markedly abnormal.  Given this complex history and with the background of significant breast density, the patient opted for bilateral mastectomies with left sentinel lymph node sampling. This was performed of 06/27/2008 and showed([id number]) in the right breast, no malignancy. In the left breast there was a 3.2 cm invasive ductal carcinoma, grade 1, focally extending to the anterior margin of the lower outer quadrant. There was extensive angiolymphatic invasion, but both sentinel lymph nodes on the left were negative.  The patient received left-sided postmastectomy radiation including of the left chest wall and left supraclavicular fossa to a total dose of 50.4 Gy plus a 10 Gy scar boost. She had an Oncotype DX recurrence score of 17, further discussed below. She decided to forego reconstruction. She was tested for BRCA1 and 2 and was found to be negative. Given her overall prognosis, she did not receive chemotherapy, but started tamoxifen February of 2010, with good tolerance.  RECURRENT DISEASE: SUMMARY OF  INITIAL PRESENTATION  Erin [name] took tamoxifen for 5 years with no evidence of active disease. May 2015 we decided to stop the tamoxifen because of problems with endometrial hyperplasia. 6 months later, at the November visit, she was found to have a change in a small area of scaring her left inframammary fold. She was referred to dermatology and biopsy 04/21/2014 showed ([id number]) recurrent ductal adenocarcinoma (positive for gross cystic disease fluid protein, estrogen, and progesterone). She then underwent a staging PET scan in [facility] 05/06/2014. This showed a 1.9 cm sclerotic lesion in the right ischial tuberosity, with a maximum standard uptake value of 4.6. A sclerotic lesion in the left side of the L3 vertebral body measuring 1.6 cm had no hypermetabolic activity.  Her subsequent history is as detailed below.   PAST MEDICAL HISTORY: Past Medical History:  Diagnosis Date  . Allergic rhinitis due to pollen   . Breast cancer ([place]) 2010  . Breast cancer ([place]) 2016   reoccurrence in scar  . Hypertension   . Hypothyroidism   . Osteopenia   . S/P radiation therapy 07/12/2014 through 08/16/2014    Left chest wall 4000 cGy in 20 sessions, left chest wall boost 1000 cGy in 5 sessions  . Wears glasses   Diverticulosis     PAST SURGICAL HISTORY: Past Surgical History:  Procedure Laterality Date  . BREAST LUMPECTOMY Left 06/01/2014   Procedure: WIDE LOCAL EXCISION OF RECURRENT LEFT BREAST CANCER;  Surgeon: Erin Bookbinder, MD;  Location: [facility];  Service: General;  Laterality: Left;  .  COLONOSCOPY    . cyst removed     left lower jaw - at [name] 58 yr  . DILATATION & CURRETTAGE/HYSTEROSCOPY WITH RESECTOCOPE N/A 09/16/2013   Procedure: DILATATION & CURETTAGE/HYSTEROSCOPY WITH RESECTOCOPE;  Surgeon: Erin Bruins, MD;  Location: [facility];  Service: Gynecology;  Laterality: N/A;  1 hr.  . INGUINAL HERNIA  REPAIR     right side as infant  . LAPAROSCOPIC PELVIC LYMPH NODE BIOPSY    . MASTECTOMY  2010   bilateral mastectomies-lt snbx-radiation post  . MASTECTOMY Bilateral   . OOPHORECTOMY  2016   patient denies  . Right knee arthroscopy  2008  . ROBOTIC ASSISTED BILATERAL SALPINGO OOPHERECTOMY Bilateral 03/13/2015   Procedure: ROBOTIC ASSISTED BILATERAL SALPINGO OOPHORECTOMY With Washings;  Surgeon: Erin Bruins, MD;  Location: [facility];  Service: Gynecology;  Laterality: Bilateral;  . TONSILLECTOMY    . WISDOM TOOTH EXTRACTION      FAMILY HISTORY Family History  Problem Relation [name] of Onset  . Hypertension Mother   . Cancer Mother        Lung Cancer - long history of tobacco use  . Hypertension Father   . Stroke Maternal Grandfather   . Stroke Paternal Grandfather   . Hypertension Brother   . Colon cancer Neg Hx   . Stomach cancer Neg Hx   The patient's mother died in 1505 from complications of lung cancer. The patient has not been in touch with her father her for approximately 30 years. She had no sisters, 1 brother, who is in good health. There is no breast or ovarian cancer in the family to her knowledge.   GYNECOLOGIC HISTORY:  GX P0, menarche at around [name] 54, the patient continued to have periods despite being on tamoxifen, although more irregularly. 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. She lives alone and has no pets. Her work involves quite a bit of travel.     ADVANCED DIRECTIVES: not in place   HEALTH MAINTENANCE:     Social History   Tobacco Use  . Smoking status: Never Smoker  . Smokeless tobacco: Never Used  Substance Use Topics  . Alcohol use: No    Alcohol/week: 0.0 standard drinks    Frequency: Never    Comment: rare  . Drug use: No      Colonoscopy: December 2013 Collene Mares)  PAP: Feb 2011  Bone density: 06/2009, normal  Cholesterol: "good"    Allergies  Allergen Reactions  . Pollen Extract   .  Adhesive [Tape] Rash    MEDICATIONS:   Current Outpatient Medications  Medication Sig Dispense Refill  . acetaminophen (TYLENOL) 500 MG tablet Take 500 mg by mouth every 6 (six) hours as needed for moderate pain or headache.    Marland Kitchen acyclovir ointment (ZOVIRAX) 5 % Apply 1 application topically every 3 (three) hours. As needed 5 g 5  . anastrozole (ARIMIDEX) 1 MG tablet Take 1 tablet (1 mg total) by mouth daily. 90 tablet 4  . Cholecalciferol (VITAMIN D3) 2000 UNITS capsule Take 2,000 Units by mouth daily.      Marland Kitchen ibuprofen (ADVIL,MOTRIN) 200 MG tablet Take 200 mg by mouth every 6 (six) hours as needed for moderate pain.    Marland Kitchen levothyroxine (SYNTHROID, LEVOTHROID) 75 MCG tablet TAKE 1 TABLET(75 MCG) BY MOUTH DAILY BEFORE BREAKFAST 90 tablet 3  . loratadine (CLARITIN) 10 MG tablet Take 10 mg by mouth daily as needed for allergies.     Marland Kitchen losartan (  COZAAR) 100 MG tablet Take 1 tablet (100 mg total) by mouth daily. 90 tablet 3  . Olopatadine HCl (PAZEO) 0.7 % SOLN Apply to eye.    Vladimir Faster Glycol-Propyl Glycol (SYSTANE OP) Apply 1 drop to eye as needed (for allergies).    . sodium chloride (OCEAN) 0.65 % SOLN nasal spray Place 1 spray into both nostrils as needed for congestion.    . Turmeric 500 MG CAPS Take 500 mg by mouth 2 (two) times daily.     No current facility-administered medications for this visit.     OBJECTIVE:  Middle-aged white woman in no acute distress    There were no vitals filed for this visit.   There is no height or weight on file to calculate BMI.   ECOG FS: 1 - Symptomatic but completely ambulatory    LABS:           Chemistry      Component Value Date/Time   NA 135 09/04/2018 1302   NA 139 02/28/2017 1521   K 4.7 09/04/2018 1302   K 4.6 02/28/2017 1521   CL 103 09/04/2018 1302   CL 105 08/10/2012 0944   CO2 24 09/04/2018 1302   CO2 29 02/28/2017 1521   BUN 15 09/04/2018 1302   BUN 20.0 02/28/2017 1521   CREATININE 0.89 09/04/2018 1302   CREATININE 1.0  02/28/2017 1521      Component Value Date/Time   CALCIUM 9.6 09/04/2018 1302   CALCIUM 10.4 02/28/2017 1521   ALKPHOS 120 09/04/2018 1302   ALKPHOS 62 02/28/2017 1521   AST 18 09/04/2018 1302   AST 18 02/28/2017 1521   ALT 15 09/04/2018 1302   ALT 13 02/28/2017 1521   BILITOT 1.0 09/04/2018 1302   BILITOT 1.17 02/28/2017 1521         Lab Results  Component Value Date   WBC 5.2 09/04/2018   HGB 14.9 09/04/2018   HCT 43.7 09/04/2018   MCV 85.4 09/04/2018   PLT 237 09/04/2018   NEUTROABS 3.4 09/04/2018    STUDIES  No results found.  ASSESSMENT: 58 y.o.  BRCA negative [place] woman ,  (1) status post bilateral mastectomies January 2010 for a left-sided upper outer quadrant T2 N0 (stage IIA) invasive ductal carcinoma, grade 1, strongly estrogen and progesterone receptor positive, HER-2 nonamplified,   (2) Oncotype DX recurrence score of 17, predicting a 10-12% distant recurrence risk over 10 years if her only adjuvant treatment is tamoxifen for 5 years  (3) status post Left postmastectomy radiation including Left supraclavicular fossa (5040 cGy in 28 sessions)  (4) on tamoxifen starting February of 2010, stopped May 2015 because of endometrial hyperplasia  (5) osteopenia-- T score -1.2 on repeated bone density scans 09/24/2011 and 12/12/2013   RECURRENT DISEASE (6) skin biopsy of a lesion under the left breast scar 04/21/2014 shows adenocarcinoma, gross cystic disease fluid protein, estrogen and progesterone receptor positive  (a) s/p wide excision with negative though close margins 06/01/2014, prognostic panel pending  (b) radiation to Left chest wall 4000 cGy in 20 sessions, left schest wall boost 1000 cGy in 5 session 07/12/2014 through 08/16/2014  (7) PET scan and bone scan show only one additional area of concern, in the right ischiial tuberosity--  (a) biopsy 05/24/2014 showed no evidence of malignancy  (b) zolendronate started 06/06/2014, poorly tolerated   (c) denosumab/ Xgeva started 09/26/2014, switched to Prolia as of May 2017  (d) repeat bone scan 12/20/2014 shows no new areas of disease, and  the previously biopsied area to be delivered  (e) restaging studies 07/07/2015 do not support a diagnosis of metastatic disease.  (f) MRI of the pelvis 04/13/2016 shows the 2.2 lesion in the right is she'll tuberosity to be unchanged  (8) goserelin monthly started 05/09/2016, last dose 02/20/2015  (a) s/p BSO 03/13/2015 with benign pathology  (9) anastrozole started  08/29/2014  (a) bone density 12/13/2015 shows improvement, with the lumbar spine T score now at 0.1, the femoral neck -1.1, both increased  (b) continues on denosumab/Prolia most recent dose 09/05/2017  (c) repeat bone density 01/30/2018 shows a T score of 0.1  (d) denosumab/Prolia discontinued after 09/05/2017 dose   PLAN: Erin [name] is now 4-1/2 years out from chest wall recurrence with no evidence of active disease.  This is very favorable.  She continues on anastrozole with good tolerance.  She had a normal bone density in October 2019  She is taking appropriate COVID precautions  We discussed her cough extensively.  I do not think it is aspiration.  It could be intermittent allergy issues.  It could be hidden reflux.  She is going to consider Prilosec 20 mg at bedtime for 2 or 3 weeks to see if that clears the problem but it is not constant and therefore may be hard to evaluate  I am going to repeat an MRI of the pelvis in October of this year and I will see her the last week in October  She knows to call for any other issue that may develop before then.  Daizha Anand, Erin Dad, MD  09/10/18 3:51 PM Medical Oncology and Hematology [facility] [address] [place], [place] [address] Tel. [phone]    Fax. [phone]    I, Erin [name], am acting as scribe for Dr. Virgie [name]. Deanne Bedgood.  I, Lurline Del MD, have reviewed the above documentation for  accuracy and completeness, and I agree with the above.

## 2018-09-11 ENCOUNTER — Telehealth: Payer: Self-pay | Admitting: Oncology

## 2018-09-11 NOTE — Telephone Encounter (Signed)
Tried to reach regarding schedule °

## 2018-09-15 ENCOUNTER — Telehealth: Payer: Self-pay | Admitting: Oncology

## 2018-09-15 NOTE — Telephone Encounter (Signed)
Patient called to reschedule  °

## 2018-11-16 ENCOUNTER — Other Ambulatory Visit (INDEPENDENT_AMBULATORY_CARE_PROVIDER_SITE_OTHER): Payer: BC Managed Care – PPO

## 2018-11-16 DIAGNOSIS — E038 Other specified hypothyroidism: Secondary | ICD-10-CM | POA: Diagnosis not present

## 2018-11-16 LAB — TSH: TSH: 1.94 u[IU]/mL (ref 0.35–4.50)

## 2018-11-16 LAB — T4, FREE: Free T4: 1.12 ng/dL (ref 0.60–1.60)

## 2018-12-11 ENCOUNTER — Other Ambulatory Visit: Payer: Self-pay | Admitting: *Deleted

## 2018-12-11 ENCOUNTER — Telehealth: Payer: Self-pay | Admitting: *Deleted

## 2018-12-11 DIAGNOSIS — R937 Abnormal findings on diagnostic imaging of other parts of musculoskeletal system: Secondary | ICD-10-CM

## 2018-12-11 NOTE — Telephone Encounter (Signed)
Per pt request MRI location changed from [place] to [facility] ( site of prior mri's)

## 2019-01-08 ENCOUNTER — Other Ambulatory Visit: Payer: Self-pay

## 2019-01-08 ENCOUNTER — Ambulatory Visit: Payer: BC Managed Care – PPO

## 2019-01-21 ENCOUNTER — Telehealth: Payer: Self-pay | Admitting: Oncology

## 2019-01-21 NOTE — Telephone Encounter (Signed)
[facility] 10/29 moved appointments from 10/29 to 11/6. Left message for patient. Schedule mailed. Other appointments reman the same.

## 2019-01-30 ENCOUNTER — Other Ambulatory Visit: Payer: Self-pay

## 2019-01-30 ENCOUNTER — Ambulatory Visit (INDEPENDENT_AMBULATORY_CARE_PROVIDER_SITE_OTHER): Payer: BC Managed Care – PPO

## 2019-01-30 DIAGNOSIS — Z23 Encounter for immunization: Secondary | ICD-10-CM | POA: Diagnosis not present

## 2019-02-12 ENCOUNTER — Inpatient Hospital Stay: Payer: BC Managed Care – PPO | Attending: Oncology

## 2019-02-12 ENCOUNTER — Ambulatory Visit (HOSPITAL_COMMUNITY)
Admission: RE | Admit: 2019-02-12 | Discharge: 2019-02-12 | Disposition: A | Discharge status: Home / Self Care | Payer: BC Managed Care – PPO | Source: Ambulatory Visit | Attending: Oncology | Admitting: Oncology

## 2019-02-12 ENCOUNTER — Other Ambulatory Visit: Payer: Self-pay

## 2019-02-12 DIAGNOSIS — R937 Abnormal findings on diagnostic imaging of other parts of musculoskeletal system: Secondary | ICD-10-CM | POA: Insufficient documentation

## 2019-02-12 DIAGNOSIS — Z170 Estrogen receptor positive status [ER+]: Secondary | ICD-10-CM | POA: Diagnosis not present

## 2019-02-12 DIAGNOSIS — C50412 Malignant neoplasm of upper-outer quadrant of left female breast: Secondary | ICD-10-CM | POA: Diagnosis present

## 2019-02-12 LAB — CBC WITH DIFFERENTIAL (CANCER CENTER ONLY)
Abs Immature Granulocytes: 0.02 10*3/uL (ref 0.00–0.07)
Basophils Absolute: 0 10*3/uL (ref 0.0–0.1)
Basophils Relative: 1 %
Eosinophils Absolute: 0.1 10*3/uL (ref 0.0–0.5)
Eosinophils Relative: 3 %
HCT: 42.5 % (ref 36.0–46.0)
Hemoglobin: 14.5 g/dL (ref 12.0–15.0)
Immature Granulocytes: 0 %
Lymphocytes Relative: 23 %
Lymphs Abs: 1.3 10*3/uL (ref 0.7–4.0)
MCH: 29.4 pg (ref 26.0–34.0)
MCHC: 34.1 g/dL (ref 30.0–36.0)
MCV: 86.2 fL (ref 80.0–100.0)
Monocytes Absolute: 0.5 10*3/uL (ref 0.1–1.0)
Monocytes Relative: 9 %
Neutro Abs: 3.6 10*3/uL (ref 1.7–7.7)
Neutrophils Relative %: 64 %
Platelet Count: 226 10*3/uL (ref 150–400)
RBC: 4.93 MIL/uL (ref 3.87–5.11)
RDW: 11.9 % (ref 11.5–15.5)
WBC Count: 5.5 10*3/uL (ref 4.0–10.5)
nRBC: 0 % (ref 0.0–0.2)

## 2019-02-12 LAB — CMP (CANCER CENTER ONLY)
ALT: 12 U/L (ref 0–44)
AST: 17 U/L (ref 15–41)
Albumin: 3.9 g/dL (ref 3.5–5.0)
Alkaline Phosphatase: 116 U/L (ref 38–126)
Anion gap: 9 (ref 5–15)
BUN: 19 mg/dL (ref 6–20)
CO2: 26 mmol/L (ref 22–32)
Calcium: 9.5 mg/dL (ref 8.9–10.3)
Chloride: 103 mmol/L (ref 98–111)
Creatinine: 0.92 mg/dL (ref 0.44–1.00)
GFR, Est AFR Am: >60 mL/min (ref >60)
GFR, Estimated: >60 mL/min (ref >60)
Glucose, Bld: 90 mg/dL (ref 70–99)
Potassium: 4.3 mmol/L (ref 3.5–5.1)
Sodium: 138 mmol/L (ref 135–145)
Total Bilirubin: 1.6 mg/dL — ABNORMAL HIGH (ref 0.3–1.2)
Total Protein: 6.8 g/dL (ref 6.5–8.1)

## 2019-02-12 MED ORDER — GADOBUTROL 1 MMOL/ML IV SOLN
7.0000 mL | Freq: Once | INTRAVENOUS | Status: AC | PRN
  Administered 2019-02-12: 14:00:00 7 mL via INTRAVENOUS

## 2019-02-14 ENCOUNTER — Other Ambulatory Visit: Payer: Self-pay | Admitting: Oncology

## 2019-02-15 ENCOUNTER — Other Ambulatory Visit: Payer: Self-pay | Admitting: Oncology

## 2019-02-15 DIAGNOSIS — C50412 Malignant neoplasm of upper-outer quadrant of left female breast: Secondary | ICD-10-CM

## 2019-02-15 DIAGNOSIS — C50912 Malignant neoplasm of unspecified site of left female breast: Secondary | ICD-10-CM

## 2019-02-15 DIAGNOSIS — Z170 Estrogen receptor positive status [ER+]: Secondary | ICD-10-CM

## 2019-02-15 DIAGNOSIS — M899 Disorder of bone, unspecified: Secondary | ICD-10-CM | POA: Insufficient documentation

## 2019-02-15 NOTE — Progress Notes (Signed)
I discussed the MRI results with the radiologist Dr. Posey Pronto and I called [name] with the results.  Basically he feels this lesion has grown.  It was moderately hot between 4 and 5 on the SUV on the original PET scan in 2016.  We are going to try to obtain a PET scan and repeat it.  If it is still hot we will biopsy it.  Of course this will also tell us whether there are other lesions.  If it is not "hot" on the PET we will continue to observe.  She will see me 1106 and we will consider going back on Prolia at that time.

## 2019-02-16 ENCOUNTER — Ambulatory Visit: Payer: BC Managed Care – PPO | Admitting: Oncology

## 2019-02-17 ENCOUNTER — Telehealth: Payer: Self-pay | Admitting: Adult Health

## 2019-02-17 NOTE — Telephone Encounter (Signed)
Called [facility] about peer to peer for Erin [name] PET scan.  PET scan had already been approved.  Authorization [id number] valid from 02/15/2019-08/13/2019 at [facility].   Time spent: 6 minutes  Wilber Bihari, NP

## 2019-02-18 ENCOUNTER — Ambulatory Visit: Payer: BC Managed Care – PPO | Admitting: Oncology

## 2019-02-22 ENCOUNTER — Other Ambulatory Visit: Payer: Self-pay

## 2019-02-22 ENCOUNTER — Ambulatory Visit (HOSPITAL_COMMUNITY)
Admission: RE | Admit: 2019-02-22 | Discharge: 2019-02-22 | Disposition: A | Discharge status: Home / Self Care | Payer: BC Managed Care – PPO | Source: Ambulatory Visit | Attending: Oncology | Admitting: Oncology

## 2019-02-22 DIAGNOSIS — Z170 Estrogen receptor positive status [ER+]: Secondary | ICD-10-CM | POA: Diagnosis present

## 2019-02-22 DIAGNOSIS — C50412 Malignant neoplasm of upper-outer quadrant of left female breast: Secondary | ICD-10-CM | POA: Insufficient documentation

## 2019-02-22 DIAGNOSIS — M899 Disorder of bone, unspecified: Secondary | ICD-10-CM | POA: Diagnosis present

## 2019-02-22 DIAGNOSIS — C50912 Malignant neoplasm of unspecified site of left female breast: Secondary | ICD-10-CM | POA: Diagnosis not present

## 2019-02-22 LAB — GLUCOSE, CAPILLARY: Glucose-Capillary: 92 mg/dL (ref 70–99)

## 2019-02-22 MED ORDER — FLUDEOXYGLUCOSE F - 18 (FDG) INJECTION: 8.5000 | Freq: Once | INTRAVENOUS | Status: DC | PRN

## 2019-02-24 ENCOUNTER — Encounter: Payer: Self-pay | Admitting: Oncology

## 2019-02-25 NOTE — Progress Notes (Signed)
Erin [name]  [phone] [phone]   ID: [name] DOB: 1960-06-10 MR#: [id number] CSN#: [id number]     Patient Care Team: Erin Koch, MD as PCP - General (Internal Medicine) Erin [name], Erin Dad, MD as Consulting Physician (Oncology) Erin Bookbinder, MD as Consulting Physician (General Surgery) Erin Bruins, MD as Consulting Physician (Obstetrics and Gynecology) Erin Luria, MD as Attending Physician (Dermatology) Erin [name], Erin Lines, MD as Consulting Physician (Gastroenterology) Erin Koh, MD (Inactive) as Consulting Physician (Radiation Oncology) Erin Puna, MD as Referring Physician (Radiation Oncology) Erin Age, MD as Attending Physician (Neurology) Erin [name], DDS as Referring Physician (Dentistry) Erin Pulley, DO as Consulting Physician (Family Medicine) OTHER MD: Erin [name]  CHIEF COMPLAINT: Recurrent breast cancer (s/p bilateral mastectomies)  CURRENT THERAPY: anastrozole   INTERVAL HISTORY: Erin [name] returns today for follow-up of her recurrent breast cancer.   The patient continues on anastrozole, which she is tolerating well.   Since her last visit, she underwent pelvis MRI to evaluate a possible bone lesion seen on prior MRI. This showed: abnormal bone lesion measuring 2.4 cm (previously 2 cm) in the right ischial tuberosity concerning for malignancy; no other abnormal bone lesions.  In light of progression of the bone lesion, she proceeded to PET scan on 02/22/2019, which showed: persistent increased radiotracer uptake localizing to the right ischial tuberosity, not significantly changed since prior PET in 04/2014; no new foci of increased uptake identified to suggest progression of disease.   REVIEW OF SYSTEMS: Erin [name] has no symptoms related to the pelvic lesion.  Her functional status is excellent, she is working full-time, and she exercises regularly.  She is working on her allergies and has started  some new medications.  Otherwise a detailed review of systems today was entirely benign.   BREAST CANCER HISTORY From the original intake note 04/07/2011:  Erin [name] had routine screening mammography December of 2009 at the [facility] in [place], [place], showing an increasing asymmetry in her left breast. She was recalled for additional views December 29. She was noted to have heterogeneously dense breasts. It was a 5 cm area of architectural distortion in the lateral aspect of the left breast with no associated calcifications. There was a second lesion medial to this. Ultrasound showed a highly suspicious hypoechoic irregularly marginated mass measuring 3 cm at the 2:30 position 5 cm from the nipple. This was palpable to the mammographer. The second area in question I measured 7 mm and a third lesion was noted measuring 5 mm. Some left axillary lymph nodes were morphologically normal.  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 Bilateral breast MRIs were performed 05/04/2008 and showed, in the left breast, a large lobulated mass measuring up to 4.3 cm, and including both of the apparently separate masses the previously biopsied. In the right breast there were 2 indeterminate lesions. These were evaluated further with breast specific gamma imaging performed January 14 in both lesions were negative. The lesion in the left breast was markedly abnormal.  Given this complex history and with the background of significant breast density, the patient opted for bilateral mastectomies with left sentinel lymph node sampling. This was performed of 06/27/2008 and showed([id number]. [id number]) in the right breast, no malignancy. In the left breast there was a 3.2 cm invasive ductal carcinoma, grade 1, focally extending to the anterior margin of  the lower outer  quadrant. There was extensive angiolymphatic invasion, but both sentinel lymph nodes on the left were negative.  The patient received left-sided postmastectomy radiation including of the left chest wall and left supraclavicular fossa to a total dose of 50.4 Gy plus a 10 Gy scar boost. She had an Oncotype DX recurrence score of 17, further discussed below. She decided to forego reconstruction. She was tested for BRCA1 and 2 and was found to be negative. Given her overall prognosis, she did not receive chemotherapy, but started tamoxifen February of 2010, with good tolerance.  RECURRENT DISEASE: SUMMARY OF INITIAL PRESENTATION  Erin [name] took tamoxifen for 5 years with no evidence of active disease. May 2015 we decided to stop the tamoxifen because of problems with endometrial hyperplasia. 6 months later, at the November visit, she was found to have a change in a small area of scaring her left inframammary fold. She was referred to dermatology and biopsy 04/21/2014 showed ([id number]) recurrent ductal adenocarcinoma (positive for gross cystic disease fluid protein, estrogen, and progesterone). She then underwent a staging PET scan in [facility] 05/06/2014. This showed a 1.9 cm sclerotic lesion in the right ischial tuberosity, with a maximum standard uptake value of 4.6. A sclerotic lesion in the left side of the L3 vertebral body measuring 1.6 cm had no hypermetabolic activity.  Her subsequent history is as detailed below.   PAST MEDICAL HISTORY: Past Medical History:  Diagnosis Date   Allergic rhinitis due to pollen    Breast cancer ([place]) 2010   Breast cancer ([place]) 2016   reoccurrence in scar   Hypertension    Hypothyroidism    Osteopenia    S/P radiation therapy 07/12/2014 through 08/16/2014    Left chest wall 4000 cGy in 20 sessions, left chest wall boost 1000 cGy in 5 sessions   Wears glasses   Diverticulosis     PAST  SURGICAL HISTORY: Past Surgical History:  Procedure Laterality Date   BREAST LUMPECTOMY Left 06/01/2014   Procedure: WIDE LOCAL EXCISION OF RECURRENT LEFT BREAST CANCER;  Surgeon: Erin Bookbinder, MD;  Location: [facility];  Service: General;  Laterality: Left;   COLONOSCOPY     cyst removed     left lower jaw - at [name] 33 yr   [place] N/A 09/16/2013   Procedure: [place];  Surgeon: Erin Bruins, MD;  Location: [facility];  Service: Gynecology;  Laterality: N/A;  1 hr.   INGUINAL HERNIA REPAIR     right side as infant   LAPAROSCOPIC PELVIC LYMPH NODE BIOPSY     MASTECTOMY  2010   bilateral mastectomies-lt snbx-radiation post   MASTECTOMY Bilateral    OOPHORECTOMY  2016   patient denies   Right knee arthroscopy  2008   ROBOTIC ASSISTED BILATERAL SALPINGO OOPHERECTOMY Bilateral 03/13/2015   Procedure: ROBOTIC ASSISTED BILATERAL SALPINGO OOPHORECTOMY With [place];  Surgeon: Erin Bruins, MD;  Location: [facility];  Service: Gynecology;  Laterality: Bilateral;   TONSILLECTOMY     WISDOM TOOTH EXTRACTION      FAMILY HISTORY Family History  Problem Relation [name] of Onset   Hypertension Mother    Cancer Mother        Lung Cancer - long history of tobacco use   Hypertension Father    Stroke Maternal Grandfather    Stroke Paternal Grandfather    Hypertension Brother    Colon cancer Neg Hx    Stomach cancer Neg Hx  The patient's mother died in 2671 from complications of lung cancer. The patient has not been in touch with her father her for approximately 30 years. She had no sisters, 1 brother, who is in good health. There is no breast or ovarian cancer in the family to her knowledge.   GYNECOLOGIC HISTORY:  GX P0, menarche at around [name] 61, the patient continued to have periods despite being on tamoxifen, although more irregularly. 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. She lives alone and has no pets. Her work involves quite a bit of travel.     ADVANCED DIRECTIVES: not in place   HEALTH MAINTENANCE:     Social History   Tobacco Use   Smoking status: Never Smoker   Smokeless tobacco: Never Used  Substance Use Topics   Alcohol use: No    Alcohol/week: 0.0 standard drinks    Frequency: Never    Comment: rare   Drug use: No      Colonoscopy: December 2013 [facility]ares)  PAP: Feb 2011  Bone density: 06/2009, normal  Cholesterol: "good"    Allergies  Allergen Reactions   Pollen Extract    Adhesive [Tape] Rash    MEDICATIONS:   Current Outpatient Medications  Medication Sig Dispense Refill   acetaminophen (TYLENOL) 500 MG tablet Take 500 mg by mouth every 6 (six) hours as needed for moderate pain or headache.     acyclovir ointment (ZOVIRAX) 5 % Apply 1 application topically every 3 (three) hours. As needed 5 g 5   anastrozole (ARIMIDEX) 1 MG tablet Take 1 tablet (1 mg total) by mouth daily. 90 tablet 4   Cholecalciferol (VITAMIN D3) 2000 UNITS capsule Take 2,000 Units by mouth daily.       ibuprofen (ADVIL,MOTRIN) 200 MG tablet Take 200 mg by mouth every 6 (six) hours as needed for moderate pain.     levothyroxine (SYNTHROID, LEVOTHROID) 75 MCG tablet TAKE 1 TABLET(75 MCG) BY MOUTH DAILY BEFORE BREAKFAST 90 tablet 3   loratadine (CLARITIN) 10 MG tablet Take 10 mg by mouth daily as needed for allergies.      losartan (COZAAR) 100 MG tablet Take 1 tablet (100 mg total) by mouth daily. 90 tablet 3   Olopatadine HCl (PAZEO) 0.7 % SOLN Apply to eye.     Polyethyl Glycol-Propyl Glycol (SYSTANE OP) Apply 1 drop to eye as needed (for allergies).     sodium chloride (OCEAN) 0.65 % SOLN nasal spray Place 1 spray into both nostrils as needed for congestion.     Turmeric 500 MG CAPS Take 500 mg by mouth 2 (two) times daily.     No current facility-administered  medications for this visit.    Facility-Administered Medications Ordered in Other Visits  Medication Dose Route Frequency Provider Last Rate Last Dose   fludeoxyglucose F - 18 (FDG) injection 8.5 millicurie  8.5 millicurie Intravenous Once PRN Julian Hy, MD        OBJECTIVE:  Middle-aged white woman who appears well    Vitals:   02/26/19 1330  BP: (!) 142/70  Pulse: 82  Resp: 20  Temp: 98 F (36.7 C)  SpO2: 98%     Body mass index is 27.04 kg/m.   ECOG FS: 1 - Symptomatic but completely ambulatory  Sclerae unicteric, EOMs intact Wearing a mask No cervical or supraclavicular adenopathy Lungs no rales or rhonchi Heart regular rate and rhythm Abd soft, nontender, positive bowel sounds MSK no focal spinal  tenderness, no upper extremity lymphedema Neuro: nonfocal, well oriented, appropriate affect Breasts: Status post bilateral mastectomies.  No evidence of chest wall recurrence.   LABS:           Chemistry      Component Value Date/Time   NA 138 02/12/2019 1144   NA 139 02/28/2017 1521   K 4.3 02/12/2019 1144   K 4.6 02/28/2017 1521   CL 103 02/12/2019 1144   CL 105 08/10/2012 0944   CO2 26 02/12/2019 1144   CO2 29 02/28/2017 1521   BUN 19 02/12/2019 1144   BUN 20.0 02/28/2017 1521   CREATININE 0.92 02/12/2019 1144   CREATININE 1.0 02/28/2017 1521      Component Value Date/Time   CALCIUM 9.5 02/12/2019 1144   CALCIUM 10.4 02/28/2017 1521   ALKPHOS 116 02/12/2019 1144   ALKPHOS 62 02/28/2017 1521   AST 17 02/12/2019 1144   AST 18 02/28/2017 1521   ALT 12 02/12/2019 1144   ALT 13 02/28/2017 1521   BILITOT 1.6 (H) 02/12/2019 1144   BILITOT 1.17 02/28/2017 1521         Lab Results  Component Value Date   WBC 5.5 02/12/2019   HGB 14.5 02/12/2019   HCT 42.5 02/12/2019   MCV 86.2 02/12/2019   PLT 226 02/12/2019   NEUTROABS 3.6 02/12/2019    STUDIES  Mr Pelvis W Wo Contrast  Result Date: 02/12/2019 CLINICAL DATA:  Abnormal bone scan,  evaluate possible bone lesion in the ischial tuberosity. EXAM: MRI PELVIS WITHOUT AND WITH CONTRAST TECHNIQUE: Multiplanar multisequence MR imaging of the pelvis was performed both before and after administration of intravenous contrast. CONTRAST:  70m GADAVIST GADOBUTROL 1 MMOL/ML IV SOLN COMPARISON:  03/18/2018, 03/22/2017, 04/13/2016 FINDINGS: Bones: No hip fracture, dislocation or avascular necrosis. Abnormal bone lesion measuring 2.4 x 2.3 x 2.2 cm in the right ischial tuberosity with abnormal enhancement most concerning for metastatic disease. No other abnormal bone lesion. Minimal osteoarthritis of the right SI joint. No SI joint widening or erosive changes. Degenerative disease with disc height loss of the lumbar spine most severe at L2-3 with reactive endplate edema. Articular cartilage and labrum Articular cartilage:  No chondral defect. Labrum: Grossly intact, but evaluation is limited by lack of intraarticular fluid. Joint or bursal effusion Joint effusion:  No hip joint effusion.  No SI joint effusion. Bursae:  No bursa formation. Muscles and tendons Flexors: Normal. Extensors: Normal. Abductors: Normal. Adductors: Normal. Gluteals: Normal. Hamstrings: Normal. Other findings Miscellaneous: No pelvic free fluid. No fluid collection or hematoma. No inguinal lymphadenopathy. No inguinal hernia. IMPRESSION: 1. Abnormal bone lesion measuring 2.4 x 2.3 x 2.2 cm in the right ischial tuberosity slightly larger and more conspicuous compared with the prior examinations most concerning for malignancy. 2. No other abnormal bone lesions. Electronically Signed   By: HKathreen Devoid  On: 02/12/2019 15:20   Nm Pet Image Restag (ps) Skull Base To Thigh  Result Date: 02/22/2019 CLINICAL DATA:  Subsequent treatment strategy for breast cancer. EXAM: NUCLEAR MEDICINE PET SKULL BASE TO THIGH TECHNIQUE: 8.5 mCi F-18 FDG was injected intravenously. Full-ring PET imaging was performed from the skull base to thigh after the  radiotracer. CT data was obtained and used for attenuation correction and anatomic localization. Fasting blood glucose: 92 mg/dl COMPARISON:  None. FINDINGS: Mediastinal blood pool activity: SUV max 2.3 Liver activity: SUV max NA NECK: No hypermetabolic lymph nodes in the neck. Incidental CT findings: none CHEST: No hypermetabolic mediastinal or hilar nodes.  No suspicious pulmonary nodules on the CT scan. Incidental CT findings: none ABDOMEN/PELVIS: No abnormal hypermetabolic activity within the liver, pancreas, adrenal glands, or spleen. No hypermetabolic lymph nodes in the abdomen or pelvis. Incidental CT findings: none SKELETON: Corresponding to the MRI abnormality is a focal area of increased radiotracer uptake localizing to the right ischial tuberosity. SUV max is equal to 5.76. Suspicious for bone metastases. This is not significantly changed when compared with the PET-CT from 05/06/2014. Incidental CT findings: none IMPRESSION: 1. Persistent increased radiotracer uptake localizing to the right ischial tuberosity concerning for bone metastases. When compared with previous PET-CT the degree of FDG uptake is not significantly changed in the interval. 2. No new foci of increased uptake identified to suggest progression of disease. Electronically Signed   By: Kerby Moors M.D.   On: 02/22/2019 14:05    ASSESSMENT: 58 y.o.  BRCA negative Erin [name] woman ,  (1) status post bilateral mastectomies January 2010 for a left-sided upper outer quadrant T2 N0 (stage IIA) invasive ductal carcinoma, grade 1, strongly estrogen and progesterone receptor positive, HER-2 nonamplified,   (2) Oncotype DX recurrence score of 17, predicting a 10-12% distant recurrence risk over 10 years if her only adjuvant treatment is tamoxifen for 5 years  (3) status post Left postmastectomy radiation including Left supraclavicular fossa (5040 cGy in 28 sessions)  (4) on tamoxifen starting February of 2010, stopped May 2015 because  of endometrial hyperplasia  (5) osteopenia-- T score -1.2 on repeated bone density scans 09/24/2011 and 12/12/2013   RECURRENT DISEASE (6) skin biopsy of a lesion under the left breast scar 04/21/2014 shows adenocarcinoma, gross cystic disease fluid protein, estrogen and progesterone receptor positive  (a) s/p wide excision with negative though close margins 06/01/2014, prognostic panel pending  (b) radiation to Left chest wall 4000 cGy in 20 sessions, left schest wall boost 1000 cGy in 5 session 07/12/2014 through 08/16/2014  (7) PET scan and bone scan show only one additional area of concern, in the right ischiial tuberosity--  (a) biopsy of the right ischial tuberosity 05/24/2014 showed no evidence of malignancy  (b) zolendronate started 06/06/2014, poorly tolerated  (c) denosumab/ Xgeva started 09/26/2014, switched to Prolia as of May 2017  (d) repeat bone scan 12/20/2014 shows no new/additional areas of disease  (e) restaging studies 07/07/2015 do not support a diagnosis of metastatic disease.  (f) MRI of the pelvis 04/13/2016 and 03/19/2018 shows the lesion in the right ischial tuberosity to be unchanged  (g) pelvic MRI 02/12/2019 shows slight growth and possible increased activity of the ischial lesion  (h) PET scan 02/22/2019 shows no other areas of uptake.  The right ischial lesion area is possibly slightly progressed as compared to prior  (8) goserelin monthly started 05/09/2016, last dose 02/20/2015  (a) s/p BSO 03/13/2015 with benign pathology  (9) anastrozole started  08/29/2014  (a) bone density 12/13/2015 shows improvement, with the lumbar spine T score now at 0.1, the femoral neck -1.1, both increased  (b) continues on denosumab/Prolia most recent dose 09/05/2017  (c) repeat bone density 01/30/2018 shows a T score of 0.1  (d) denosumab/Prolia discontinued after 09/05/2017 dose   PLAN: I had a long talk with Erin [name] regarding the current situation.  The bottom line  is that there has been a little bit of change in the lesion in her ischial tuberosity.  It is a little bit harder and a little bit bigger.  We stopped her Prolia about a year ago and it may be  that that was holding it.  The bottom line though is that we do not know what we are dealing with.  If this is cancer, even though very very slow growing, I would probably favor radiation.  On the other hand if it is an inflammatory condition may be we can treat it or ignore it.  We could certainly go back to Prolia depending on what we are dealing with.  After much discussion she agrees that proceeding to biopsy again is the right way to go.  I have a call out to Dr. Kathlene Cote to try to get this scheduled for her.  As the week of Thanksgiving would be optimal if he is available but if not she will do it whenever it can be accomplished.  She will see me again early December.  Hopefully we will have more information by then.   Jazmyne Beauchesne, Erin Dad, MD  02/26/19 2:11 PM Medical Oncology and Hematology [facility] [place], [place] [address] Tel. [phone]    Fax. [phone]    I, Wilburn Mylar, am acting as scribe for Dr. Virgie [name]. Emberlyn Burlison.  I, Lurline Del MD, have reviewed the above documentation for accuracy and completeness, and I agree with the above.

## 2019-02-26 ENCOUNTER — Other Ambulatory Visit: Payer: Self-pay

## 2019-02-26 ENCOUNTER — Inpatient Hospital Stay: Payer: BC Managed Care – PPO | Attending: Oncology | Admitting: Oncology

## 2019-02-26 VITALS — BP 142/70 | HR 82 | Temp 98.0°F | Resp 20 | Ht 65.0 in | Wt 162.5 lb

## 2019-02-26 DIAGNOSIS — C50912 Malignant neoplasm of unspecified site of left female breast: Secondary | ICD-10-CM | POA: Diagnosis not present

## 2019-02-26 DIAGNOSIS — Z170 Estrogen receptor positive status [ER+]: Secondary | ICD-10-CM | POA: Diagnosis not present

## 2019-02-26 DIAGNOSIS — Z79811 Long term (current) use of aromatase inhibitors: Secondary | ICD-10-CM | POA: Diagnosis not present

## 2019-02-26 DIAGNOSIS — I10 Essential (primary) hypertension: Secondary | ICD-10-CM | POA: Diagnosis not present

## 2019-02-26 DIAGNOSIS — Z9013 Acquired absence of bilateral breasts and nipples: Secondary | ICD-10-CM | POA: Insufficient documentation

## 2019-02-26 DIAGNOSIS — E039 Hypothyroidism, unspecified: Secondary | ICD-10-CM | POA: Diagnosis not present

## 2019-02-26 DIAGNOSIS — Z923 Personal history of irradiation: Secondary | ICD-10-CM | POA: Diagnosis not present

## 2019-02-26 DIAGNOSIS — M899 Disorder of bone, unspecified: Secondary | ICD-10-CM | POA: Diagnosis not present

## 2019-02-26 DIAGNOSIS — M8580 Other specified disorders of bone density and structure, unspecified site: Secondary | ICD-10-CM | POA: Diagnosis not present

## 2019-02-26 DIAGNOSIS — C50412 Malignant neoplasm of upper-outer quadrant of left female breast: Secondary | ICD-10-CM | POA: Diagnosis not present

## 2019-03-01 ENCOUNTER — Telehealth: Payer: Self-pay | Admitting: Oncology

## 2019-03-01 ENCOUNTER — Encounter (HOSPITAL_COMMUNITY): Payer: Self-pay | Admitting: Radiology

## 2019-03-01 NOTE — Telephone Encounter (Signed)
Confirmed 12/4 f/u with patient.

## 2019-03-01 NOTE — Progress Notes (Unsigned)
Erin [name] "Erin [name]" Female, 58 y.o., 04/19/1961 MRN:  [id number] Phone:  [phone] Jerilynn Mages) PCP:  [name], MD Primary [facility]:  [facility] 03/26/2019 at 4:00 PM  RE: CT Biopsy Received: 3 days ago Message Contents  Aletta Edouard, MD  Garth Bigness D        Schedule for CT guided right ischial bone lesion biopsy with only me.   GY   Previous Messages  ----- Message -----  From: Garth Bigness D  Sent: 02/26/2019  3:33 PM EST  To: Aletta Edouard, MD  Subject: CT Biopsy                     Procedure:  CT Biopsy   Reason:  Recurrent cancer of left breast, Malignant neoplasm of upper-outer quadrant of left breast in female, estrogen receptor positive, Lesion of pelvic bone, persistent lesion in the right ischial tuberosity; discussed with Dr Kathlene Cote   History: MR Pelvis, NM PET in computer   Dr. Chauncey Cruel  [phone]

## 2019-03-03 ENCOUNTER — Telehealth: Payer: Self-pay | Admitting: Oncology

## 2019-03-03 NOTE — Telephone Encounter (Signed)
R/s appt per 11/11 sch message - unable to reach pt . Left message for patient with appt date and time

## 2019-03-04 ENCOUNTER — Telehealth: Payer: Self-pay

## 2019-03-04 NOTE — Telephone Encounter (Signed)
Received vm from pt regarding appt on 11/30 at 0930.  She needs to change the time.  In basket msg sent to schedulers to call pt to reschedule.

## 2019-03-05 ENCOUNTER — Telehealth: Payer: Self-pay | Admitting: Oncology

## 2019-03-05 NOTE — Telephone Encounter (Signed)
Called pt per 11/12 sch message - unable to reach pt . Left message with appt date and time

## 2019-03-12 ENCOUNTER — Other Ambulatory Visit: Payer: Self-pay | Admitting: Radiology

## 2019-03-14 ENCOUNTER — Other Ambulatory Visit: Payer: Self-pay | Admitting: Internal Medicine

## 2019-03-14 DIAGNOSIS — E038 Other specified hypothyroidism: Secondary | ICD-10-CM

## 2019-03-15 ENCOUNTER — Other Ambulatory Visit: Payer: Self-pay | Admitting: Radiology

## 2019-03-15 ENCOUNTER — Other Ambulatory Visit: Payer: Self-pay

## 2019-03-15 ENCOUNTER — Ambulatory Visit (HOSPITAL_COMMUNITY)
Admission: RE | Admit: 2019-03-15 | Discharge: 2019-03-15 | Disposition: A | Discharge status: Home / Self Care | Payer: BC Managed Care – PPO | Source: Ambulatory Visit | Attending: Oncology | Admitting: Oncology

## 2019-03-15 ENCOUNTER — Encounter (HOSPITAL_COMMUNITY): Payer: Self-pay

## 2019-03-15 DIAGNOSIS — Z7901 Long term (current) use of anticoagulants: Secondary | ICD-10-CM | POA: Insufficient documentation

## 2019-03-15 DIAGNOSIS — Z170 Estrogen receptor positive status [ER+]: Secondary | ICD-10-CM | POA: Diagnosis not present

## 2019-03-15 DIAGNOSIS — Z853 Personal history of malignant neoplasm of breast: Secondary | ICD-10-CM | POA: Diagnosis not present

## 2019-03-15 DIAGNOSIS — M899 Disorder of bone, unspecified: Secondary | ICD-10-CM | POA: Diagnosis not present

## 2019-03-15 DIAGNOSIS — M8580 Other specified disorders of bone density and structure, unspecified site: Secondary | ICD-10-CM | POA: Insufficient documentation

## 2019-03-15 DIAGNOSIS — Z79899 Other long term (current) drug therapy: Secondary | ICD-10-CM | POA: Insufficient documentation

## 2019-03-15 DIAGNOSIS — I10 Essential (primary) hypertension: Secondary | ICD-10-CM | POA: Diagnosis not present

## 2019-03-15 DIAGNOSIS — E039 Hypothyroidism, unspecified: Secondary | ICD-10-CM | POA: Diagnosis not present

## 2019-03-15 DIAGNOSIS — C7951 Secondary malignant neoplasm of bone: Secondary | ICD-10-CM | POA: Diagnosis not present

## 2019-03-15 DIAGNOSIS — C50912 Malignant neoplasm of unspecified site of left female breast: Secondary | ICD-10-CM

## 2019-03-15 DIAGNOSIS — C50412 Malignant neoplasm of upper-outer quadrant of left female breast: Secondary | ICD-10-CM | POA: Insufficient documentation

## 2019-03-15 LAB — CBC WITH DIFFERENTIAL/PLATELET
Abs Immature Granulocytes: 0.01 10*3/uL (ref 0.00–0.07)
Basophils Absolute: 0.1 10*3/uL (ref 0.0–0.1)
Basophils Relative: 1 %
Eosinophils Absolute: 0.2 10*3/uL (ref 0.0–0.5)
Eosinophils Relative: 3 %
HCT: 44.3 % (ref 36.0–46.0)
Hemoglobin: 15 g/dL (ref 12.0–15.0)
Immature Granulocytes: 0 %
Lymphocytes Relative: 30 %
Lymphs Abs: 1.5 10*3/uL (ref 0.7–4.0)
MCH: 29.4 pg (ref 26.0–34.0)
MCHC: 33.9 g/dL (ref 30.0–36.0)
MCV: 86.7 fL (ref 80.0–100.0)
Monocytes Absolute: 0.5 10*3/uL (ref 0.1–1.0)
Monocytes Relative: 10 %
Neutro Abs: 2.7 10*3/uL (ref 1.7–7.7)
Neutrophils Relative %: 56 %
Platelets: 225 10*3/uL (ref 150–400)
RBC: 5.11 MIL/uL (ref 3.87–5.11)
RDW: 11.8 % (ref 11.5–15.5)
WBC: 5 10*3/uL (ref 4.0–10.5)
nRBC: 0 % (ref 0.0–0.2)

## 2019-03-15 LAB — PROTIME-INR
INR: 0.9 (ref 0.8–1.2)
Prothrombin Time: 12.3 seconds (ref 11.4–15.2)

## 2019-03-15 MED ORDER — SODIUM CHLORIDE 0.9 % IV SOLN
INTRAVENOUS | Status: AC | PRN
  Administered 2019-03-15: 10 mL/h via INTRAVENOUS

## 2019-03-15 MED ORDER — FENTANYL CITRATE (PF) 100 MCG/2ML IJ SOLN
INTRAMUSCULAR | Status: AC | PRN
  Administered 2019-03-15 (×2): 50 ug via INTRAVENOUS

## 2019-03-15 MED ORDER — SODIUM CHLORIDE 0.9 % IV SOLN: INTRAVENOUS | Status: DC

## 2019-03-15 MED ORDER — FENTANYL CITRATE (PF) 100 MCG/2ML IJ SOLN
INTRAMUSCULAR | Status: AC
  Filled 2019-03-15: qty 2

## 2019-03-15 MED ORDER — MIDAZOLAM HCL 2 MG/2ML IJ SOLN
INTRAMUSCULAR | Status: AC
  Filled 2019-03-15: qty 2

## 2019-03-15 MED ORDER — MIDAZOLAM HCL 2 MG/2ML IJ SOLN
INTRAMUSCULAR | Status: AC | PRN
  Administered 2019-03-15 (×2): 1 mg via INTRAVENOUS

## 2019-03-15 MED ORDER — LIDOCAINE HCL 1 % IJ SOLN
INTRAMUSCULAR | Status: AC
  Filled 2019-03-15: qty 20

## 2019-03-15 NOTE — Progress Notes (Signed)
Ambulated to bathroom to void tol well  

## 2019-03-15 NOTE — H&P (Signed)
Chief Complaint: Patient was seen in consultation today for right ischial bone lesion biopsy at the request of [name],[name]  Referring Physician(s): Chauncey Cruel  Supervising Physician: Aletta Edouard  Patient Status: [facility] - Out-pt  History of Present Illness: Erin [name] is a 58 y.o. female   Breast Ca Dx 2010 Known ischial bone lesion and followed by Dr [name] Bone biopsy of ischial lesion 05/24/14: Negative  Followed yearly since then with MRI 02/12/19: IMPRESSION: 1. Abnormal bone lesion measuring 2.4 x 2.3 x 2.2 cm in the right ischial tuberosity slightly larger and more conspicuous compared with the prior examinations most concerning for malignancy. 2. No other abnormal bone lesions.  Follow up PET 02/22/19: IMPRESSION: 1. Persistent increased radiotracer uptake localizing to the right ischial tuberosity concerning for bone metastases. When compared with previous PET-CT the degree of FDG uptake is not significantly changed in the interval. 2. No new foci of increased uptake identified to suggest progression of disease.  Scheduled now for Rt ischial bone lesion biopsy today  Past Medical History:  Diagnosis Date  . Allergic rhinitis due to pollen   . Breast cancer ([place]) 2010  . Breast cancer ([place]) 2016   reoccurrence in scar  . Hypertension   . Hypothyroidism   . Osteopenia   . S/P radiation therapy 07/12/2014 through 08/16/2014    Left chest wall 4000 cGy in 20 sessions, left chest wall boost 1000 cGy in 5 sessions  . Wears glasses     Past Surgical History:  Procedure Laterality Date  . BREAST LUMPECTOMY Left 06/01/2014   Procedure: WIDE LOCAL EXCISION OF RECURRENT LEFT BREAST CANCER;  Surgeon: Rolm Bookbinder, MD;  Location: [place];  Service: General;  Laterality: Left;  . COLONOSCOPY    . cyst removed     left lower jaw - at age 56 yr  . DILATATION &  CURRETTAGE/HYSTEROSCOPY WITH RESECTOCOPE N/A 09/16/2013   Procedure: DILATATION & CURETTAGE/HYSTEROSCOPY WITH RESECTOCOPE;  Surgeon: Princess Bruins, MD;  Location: [facility];  Service: Gynecology;  Laterality: N/A;  1 hr.  . INGUINAL HERNIA REPAIR     right side as infant  . LAPAROSCOPIC PELVIC LYMPH NODE BIOPSY    . MASTECTOMY  2010   bilateral mastectomies-lt snbx-radiation post  . MASTECTOMY Bilateral   . OOPHORECTOMY  2016   patient denies  . Right knee arthroscopy  2008  . ROBOTIC ASSISTED BILATERAL SALPINGO OOPHERECTOMY Bilateral 03/13/2015   Procedure: ROBOTIC ASSISTED BILATERAL SALPINGO OOPHORECTOMY With Washings;  Surgeon: Princess Bruins, MD;  Location: [facility];  Service: Gynecology;  Laterality: Bilateral;  . TONSILLECTOMY    . WISDOM TOOTH EXTRACTION      Allergies: Pollen extract and Adhesive [tape]  Medications: Prior to Admission medications   Medication Sig Start Date End Date Taking? Authorizing Provider  acetaminophen (TYLENOL) 500 MG tablet Take 500 mg by mouth every 6 (six) hours as needed for moderate pain or headache.   Yes [provider]  anastrozole (ARIMIDEX) 1 MG tablet Take 1 tablet (1 mg total) by mouth daily. 09/10/18  Yes [name], [name], MD  Cholecalciferol (VITAMIN D3) 2000 UNITS capsule Take 2,000 Units by mouth daily.     Yes [provider]  fexofenadine (ALLEGRA) 180 MG tablet Take 180 mg by mouth daily as needed for allergies or rhinitis.   Yes [provider]  ibuprofen (ADVIL,MOTRIN) 200 MG tablet Take 200 mg by mouth every 6 (six) hours as needed for moderate pain.   Yes [provider]  ketoconazole (NIZORAL) 2 % shampoo Apply 1 application topically 2 (two) times a week. 12/17/18  Yes [provider]  levothyroxine (SYNTHROID) 75 MCG tablet TAKE 1 TABLET(75 MCG) BY MOUTH DAILY BEFORE BREAKFAST 03/15/19  Yes [name], MD  losartan (COZAAR) 100 MG tablet TAKE 1 TABLET(100 MG) BY MOUTH  DAILY 03/15/19  Yes [name], MD  Olopatadine HCl (PAZEO) 0.7 % SOLN Place 1 drop into both eyes daily as needed (eye allergies).  09/10/18  Yes [name], [name], MD  Olopatadine HCl 0.6 % SOLN Place 2 sprays into both nostrils 2 (two) times daily as needed (allergies).  02/25/19  Yes [provider]  Polyethyl Glycol-Propyl Glycol (SYSTANE OP) Place 1 drop into both eyes daily as needed (for allergies).    Yes [provider]  Turmeric 500 MG CAPS Take 500 mg by mouth daily.    Yes [provider]  acyclovir ointment (ZOVIRAX) 5 % Apply 1 application topically every 3 (three) hours. As needed 04/21/18   [name], MD  fluocinolone (SYNALAR) 0.01 % external solution Apply 1 application topically 2 (two) times daily as needed (irritation).  01/14/19   [provider]  sodium chloride (OCEAN) 0.65 % SOLN nasal spray Place 1 spray into both nostrils as needed for congestion.    [provider]     Family History  Problem Relation Age of Onset  . Hypertension Mother   . Cancer Mother        Lung Cancer - long history of tobacco use  . Hypertension Father   . Stroke Maternal Grandfather   . Stroke Paternal Grandfather   . Hypertension Brother   . Colon cancer Neg Hx   . Stomach cancer Neg Hx     Social History   Socioeconomic History  . Marital status: Single    Spouse name: Not on file  . Number of children: 0  . Years of education: PhD  . Highest education level: Not on file  Occupational History  . Not on file  Social Needs  . Financial resource strain: Not on file  . Food insecurity    Worry: Not on file    Inability: Not on file  . Transportation needs    Medical: Not on file    Non-medical: Not on file  Tobacco Use  . Smoking status: Never Smoker  . Smokeless tobacco: Never Used  Substance and Sexual Activity  . Alcohol use: No    Alcohol/week: 0.0 standard drinks    Frequency: Never    Comment: rare   . Drug use: No  . Sexual activity: Not Currently    Birth control/protection: None    Comment: did not answer high risk questions  Lifestyle  . Physical activity    Days per week: Not on file    Minutes per session: Not on file  . Stress: Not on file  Relationships  . Social Herbalist on phone: Not on file    Gets together: Not on file    Attends religious service: Not on file    Active member of club or organization: Not on file    Attends meetings of clubs or organizations: Not on file    Relationship status: Not on file  Other Topics Concern  . Not on file  Social History Narrative   She grew up in [place] last 16 yrs in [place]   Rare caffeine use  Review of Systems: A 12 point ROS discussed and pertinent positives are indicated in the HPI above.  All other systems are negative.  Review of Systems  Constitutional: Negative for activity change, fatigue and fever.  Respiratory: Negative for cough and shortness of breath.   Cardiovascular: Negative for chest pain.  Gastrointestinal: Negative for abdominal pain.  Musculoskeletal: Negative for back pain.  Neurological: Negative for weakness.  Psychiatric/Behavioral: Negative for behavioral problems and confusion.    Vital Signs: BP 132/81   Pulse 90   Temp (!) 97.5 F (36.4 C)   Resp 18   Ht 5\' 5"  (1.651 m)   Wt 160 lb (72.6 kg)   LMP  (LMP Unknown)   SpO2 95%   BMI 26.63 kg/m   Physical Exam Vitals signs reviewed.  Cardiovascular:     Rate and Rhythm: Normal rate and regular rhythm.     Heart sounds: Normal heart sounds.  Pulmonary:     Effort: Pulmonary effort is normal.     Breath sounds: Normal breath sounds.  Abdominal:     Palpations: Abdomen is soft.  Musculoskeletal: Normal range of motion.  Skin:    General: Skin is warm and dry.  Neurological:     Mental Status: She is alert and oriented to person, place, and time.  Psychiatric:        Behavior: Behavior normal.      Imaging: Nm Pet Image Restag (ps) Skull Base To Thigh  Result Date: 02/22/2019 CLINICAL DATA:  Subsequent treatment strategy for breast cancer. EXAM: NUCLEAR MEDICINE PET SKULL BASE TO THIGH TECHNIQUE: 8.5 mCi F-18 FDG was injected intravenously. Full-ring PET imaging was performed from the skull base to thigh after the radiotracer. CT data was obtained and used for attenuation correction and anatomic localization. Fasting blood glucose: 92 mg/dl COMPARISON:  None. FINDINGS: Mediastinal blood pool activity: SUV max 2.3 Liver activity: SUV max NA NECK: No hypermetabolic lymph nodes in the neck. Incidental CT findings: none CHEST: No hypermetabolic mediastinal or hilar nodes. No suspicious pulmonary nodules on the CT scan. Incidental CT findings: none ABDOMEN/PELVIS: No abnormal hypermetabolic activity within the liver, pancreas, adrenal glands, or spleen. No hypermetabolic lymph nodes in the abdomen or pelvis. Incidental CT findings: none SKELETON: Corresponding to the MRI abnormality is a focal area of increased radiotracer uptake localizing to the right ischial tuberosity. SUV max is equal to 5.76. Suspicious for bone metastases. This is not significantly changed when compared with the PET-CT from 05/06/2014. Incidental CT findings: none IMPRESSION: 1. Persistent increased radiotracer uptake localizing to the right ischial tuberosity concerning for bone metastases. When compared with previous PET-CT the degree of FDG uptake is not significantly changed in the interval. 2. No new foci of increased uptake identified to suggest progression of disease. Electronically Signed   By: Kerby Moors M.D.   On: 02/22/2019 14:05    Labs:  CBC: Recent Labs    03/20/18 1453 09/04/18 1302 02/12/19 1144 03/15/19 0904  WBC 6.2 5.2 5.5 5.0  HGB 15.0 14.9 14.5 15.0  HCT 45.3 43.7 42.5 44.3  PLT 240 237 226 225    COAGS: Recent Labs    03/15/19 0904  INR 0.9    BMP: Recent Labs    03/18/18 1549  03/20/18 1453 09/04/18 1302 02/12/19 1144  NA  --  138 135 138  K  --  4.6 4.7 4.3  CL  --  101 103 103  CO2  --  28 24 26   GLUCOSE  --  94 109* 90  BUN  --  22* 15 19  CALCIUM  --  10.0 9.6 9.5  CREATININE 0.90 0.99 0.89 0.92  GFRNONAA  --  >60 >60 >60  GFRAA  --  >60 >60 >60    LIVER FUNCTION TESTS: Recent Labs    03/20/18 1453 09/04/18 1302 02/12/19 1144  BILITOT 1.2 1.0 1.6*  AST 17 18 17   ALT 11 15 12   ALKPHOS 79 120 116  PROT 7.1 7.2 6.8  ALBUMIN 4.1 4.0 3.9    TUMOR MARKERS: No results for input(s): AFPTM, CEA, CA199, CHROMGRNA in the last 8760 hours.  Assessment and Plan:  Breast Ca Hx Rt ischial bone lesion known since Breast Ca diagnosis Followed by Dr [name] Bx 05/24/14 was negative Recent enlargment per MRI +PET Now scheduled for re biopsy of this lesion Risks and benefits of right ischial bone lesion biopsy was discussed with the patient and/or patient's family including, but not limited to bleeding, infection, damage to adjacent structures or low yield requiring additional tests.  All of the questions were answered and there is agreement to proceed. Consent signed and in chart.  Thank you for this interesting consult.  I greatly enjoyed meeting [place] and look forward to participating in their care.  A copy of this report was sent to the requesting provider on this date.  Electronically Signed: Lavonia Drafts, PA-C 03/15/2019, 9:59 AM   I spent a total of  40 Minutes   in face to face in clinical consultation, greater than 50% of which was counseling/coordinating care for right ischial bone lesion bx

## 2019-03-15 NOTE — Progress Notes (Addendum)
Discharge instructions reviewed with pt and her friend Remo Lipps updated. Both voice understanding.

## 2019-03-15 NOTE — Procedures (Signed)
Interventional Radiology Procedure Note  Procedure: CT Guided Biopsy of right ischial bone lesion  Complications: None  Estimated Blood Loss: < 10 mL  Findings: 10 G core biopsy of right ischial bone performed under CT guidance.  Core sample obtained and sent to Pathology.  Venetia Night. Kathlene Cote, M.D Pager:  [phone]

## 2019-03-15 NOTE — Discharge Instructions (Signed)
Needle Biopsy, Care After °These instructions tell you how to care for yourself after your procedure. Your doctor may also give you more specific instructions. Call your doctor if you have any problems or questions. °What can I expect after the procedure? °After the procedure, it is common to have: °· Soreness. °· Bruising. °· Mild pain. °Follow these instructions at home: ° °· Return to your normal activities as told by your doctor. Ask your doctor what activities are safe for you. °· Take over-the-counter and prescription medicines only as told by your doctor. °· Wash your hands with soap and water before you change your bandage (dressing). If you cannot use soap and water, use hand sanitizer. °· Follow instructions from your doctor about: °? How to take care of your puncture site. °? When and how to change your bandage. °? When to remove your bandage. °· Check your puncture site every day for signs of infection. Watch for: °? Redness, swelling, or pain. °? Fluid or blood.  °? Pus or a bad smell. °? Warmth. °· Do not take baths, swim, or use a hot tub until your doctor approves. Ask your doctor if you may take showers. You may only be allowed to take sponge baths. °· Keep all follow-up visits as told by your doctor. This is important. °Contact a doctor if you have: °· A fever. °· Redness, swelling, or pain at the puncture site, and it lasts longer than a few days. °· Fluid, blood, or pus coming from the puncture site. °· Warmth coming from the puncture site. °Get help right away if: °· You have a lot of bleeding from the puncture site. °Summary °· After the procedure, it is common to have soreness, bruising, or mild pain at the puncture site. °· Check your puncture site every day for signs of infection, such as redness, swelling, or pain. °· Get help right away if you have severe bleeding from your puncture site. °This information is not intended to replace advice given to you by your health care provider. Make  sure you discuss any questions you have with your health care provider. °Document Released: 03/21/2008 Document Revised: 04/21/2017 Document Reviewed: 04/21/2017 °Elsevier Patient Education © 2020 [employer] ° °

## 2019-03-15 NOTE — Progress Notes (Signed)
Pt states she has no one to stay with her after her procedure. Jannifer Franklin, PA informed. Dr Kathlene Cote informed and states to proceed.

## 2019-03-16 ENCOUNTER — Other Ambulatory Visit: Payer: Self-pay | Admitting: Oncology

## 2019-03-17 ENCOUNTER — Other Ambulatory Visit: Payer: Self-pay | Admitting: Oncology

## 2019-03-17 NOTE — Progress Notes (Signed)
I called Erin [name] with the preliminary report of her biopsy which shows no evidence of breast cancer.  Dr. Gari Crown is working her up for other unlikely possibilities but on first blush she does not see anything that looks like hematopoietic malignancy either.

## 2019-03-18 ENCOUNTER — Other Ambulatory Visit: Payer: Self-pay

## 2019-03-22 ENCOUNTER — Inpatient Hospital Stay: Payer: BC Managed Care – PPO | Admitting: Oncology

## 2019-03-22 ENCOUNTER — Other Ambulatory Visit: Payer: BC Managed Care – PPO

## 2019-03-22 ENCOUNTER — Ambulatory Visit: Payer: BC Managed Care – PPO | Admitting: Oncology

## 2019-03-25 ENCOUNTER — Other Ambulatory Visit: Payer: Self-pay | Admitting: Oncology

## 2019-03-25 ENCOUNTER — Other Ambulatory Visit: Payer: Self-pay

## 2019-03-25 LAB — SURGICAL PATHOLOGY

## 2019-03-25 NOTE — Progress Notes (Unsigned)
I called [name] and let her know the results of the biopsy.  We do have some estrogen stains pending.  I also sent a note to Dr. Isidore Moos to see if she would be agreeable to irradiating the area in question.  We would then go back to [facility].

## 2019-03-25 NOTE — Progress Notes (Signed)
[place]  [phone]) [phone] [phone]) [phone]   ID: Erin [name] DOB: 06-19-1960 MR#: [id number] CSN#: [id number]     Patient Care Team: [name], MD as PCP - General (Internal Medicine) Roey Coopman, [name], MD as Consulting Physician (Oncology) Rolm Bookbinder, MD as Consulting Physician (General Surgery) Princess Bruins, MD as Consulting Physician (Obstetrics and Gynecology) Crista Luria, MD as Attending Physician (Dermatology) Pyrtle, Lajuan Lines, MD as Consulting Physician (Gastroenterology) Arloa Koh, MD (Inactive) as Consulting Physician (Radiation Oncology) Bonner Puna, MD as Referring Physician (Radiation Oncology) Star Age, MD as Attending Physician (Neurology) Augustina Mood, DDS as Referring Physician (Dentistry) Lyndal Pulley, DO as Consulting Physician (Family Medicine) OTHER MD: Netty Starring  CHIEF COMPLAINT: Recurrent breast cancer (s/p bilateral mastectomies)  CURRENT THERAPY: anastrozole; to resume denosumab; radiation therapy pending   INTERVAL HISTORY: Erin [name] returns today for follow-up of her recurrent breast cancer.   In light of increased radiotracer uptake at the right ischial tuberosity on recent PET scan, she proceeded to biopsy of this area on 03/15/2019. Pathology from the procedure ([id number]) confirmed metastatic carcinoma, likely of breast primary. Estrogen and progesterone receptors were both negative.  She continues on anastrozole, with good tolerance.   REVIEW OF SYSTEMS: Erin [name] tolerated her recent biopsy fine.  She is working full-time.  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  She is now off though now until January.  She is exercising by walking and using her elliptical.  She has not gone back to the gym yet.  She tells me she has developed a mild scalp irritation which Jari Pigg is treating with a shampoo.  Also she has some allergies, which tend, on  around this time of the year and she is getting allergy shots.  Aside from these issues a detailed review of systems today was negative.   BREAST CANCER HISTORY From the original intake note 04/07/2011:  Ms. Slay had routine screening mammography December of 2009 at the [facility] in [place], [place], showing an increasing asymmetry in her left breast. She was recalled for additional views December 29. She was noted to have heterogeneously dense breasts. It was a 5 cm area of architectural distortion in the lateral aspect of the left breast with no associated calcifications. There was a second lesion medial to this. Ultrasound showed a highly suspicious hypoechoic irregularly marginated mass measuring 3 cm at the 2:30 position 5 cm from the nipple. This was palpable to the mammographer. The second area in question I measured 7 mm and a third lesion was noted measuring 5 mm. Some left axillary lymph nodes were morphologically normal.  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 Bilateral breast MRIs were performed 05/04/2008 and showed, in the left breast, a large lobulated mass measuring up to 4.3 cm, and including both of the apparently separate masses the previously biopsied. In the right breast there were 2 indeterminate lesions. These were evaluated further with breast specific gamma imaging performed January 14 in both lesions were negative. The lesion in the left breast was markedly abnormal.  Given this complex history and with the background of significant breast density, the patient opted for bilateral mastectomies with left sentinel lymph node sampling. This was performed of 06/27/2008 and showed([id number]. [id number]) in the right breast, no malignancy. In  the left breast there was a 3.2 cm invasive ductal carcinoma, grade 1, focally  extending to the anterior margin of the lower outer quadrant. There was extensive angiolymphatic invasion, but both sentinel lymph nodes on the left were negative.  The patient received left-sided postmastectomy radiation including of the left chest wall and left supraclavicular fossa to a total dose of 50.4 Gy plus a 10 Gy scar boost. She had an Oncotype DX recurrence score of 17, further discussed below. She decided to forego reconstruction. She was tested for BRCA1 and 2 and was found to be negative. Given her overall prognosis, she did not receive chemotherapy, but started tamoxifen February of 2010, with good tolerance.  RECURRENT DISEASE: SUMMARY OF INITIAL PRESENTATION  [name] took tamoxifen for 5 years with no evidence of active disease. In May 2015 we decided to stop the tamoxifen because of problems with endometrial hyperplasia. 6 months later, at the November visit, she was found to have a change in a small area of scaring her left inframammary fold. She was referred to dermatology and biopsy 04/21/2014 showed ([id number]) recurrent ductal adenocarcinoma (positive for gross cystic disease fluid protein, estrogen, and progesterone). She then underwent a staging PET scan in [facility] 05/06/2014. This showed a 1.9 cm sclerotic lesion in the right ischial tuberosity, with a maximum standard uptake value of 4.6. A sclerotic lesion in the left side of the L3 vertebral body measuring 1.6 cm had no hypermetabolic activity.  Her subsequent history is as detailed below.   PAST MEDICAL HISTORY: Past Medical History:  Diagnosis Date   Allergic rhinitis due to pollen    Breast cancer ([facility]) 2010   Breast cancer ([facility]) 2016   reoccurrence in scar   Hypertension    Hypothyroidism    Osteopenia    S/P radiation therapy 07/12/2014 through 08/16/2014    Left chest wall 4000 cGy in 20 sessions, left chest wall boost 1000 cGy in 5  sessions   Wears glasses   Diverticulosis     PAST SURGICAL HISTORY: Past Surgical History:  Procedure Laterality Date   BREAST LUMPECTOMY Left 06/01/2014   Procedure: WIDE LOCAL EXCISION OF RECURRENT LEFT BREAST CANCER;  Surgeon: Rolm Bookbinder, MD;  Location: [facility];  Service: General;  Laterality: Left;   COLONOSCOPY     cyst removed     left lower jaw - at age 30 yr   [place] N/A 09/16/2013   Procedure: [place];  Surgeon: Princess Bruins, MD;  Location: [facility];  Service: Gynecology;  Laterality: N/A;  1 hr.   INGUINAL HERNIA REPAIR     right side as infant   LAPAROSCOPIC PELVIC LYMPH NODE BIOPSY     MASTECTOMY  2010   bilateral mastectomies-lt snbx-radiation post   MASTECTOMY Bilateral    OOPHORECTOMY  2016   patient denies   Right knee arthroscopy  2008   ROBOTIC ASSISTED BILATERAL SALPINGO OOPHERECTOMY Bilateral 03/13/2015   Procedure: ROBOTIC ASSISTED BILATERAL SALPINGO OOPHORECTOMY With [place];  Surgeon: Princess Bruins, MD;  Location: [facility];  Service: Gynecology;  Laterality: Bilateral;   TONSILLECTOMY     WISDOM TOOTH EXTRACTION      FAMILY HISTORY Family History  Problem Relation Age of Onset   Hypertension Mother    Cancer Mother        Lung Cancer - long history of tobacco use   Hypertension Father    Stroke Maternal Grandfather    Stroke Paternal Grandfather  Hypertension Brother    Colon cancer Neg Hx    Stomach cancer Neg Hx   The patient's mother died in [id number] from complications of lung cancer. The patient has not been in touch with her father her for approximately 30 years. She had no sisters, 1 brother, who is in good health. There is no breast or ovarian cancer in the family to her knowledge.   GYNECOLOGIC HISTORY:  GX P0, menarche at around age 57, the patient continued to have periods despite being on  tamoxifen, although more irregularly. She is now postmenopausal (status post BSO).   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. She lives alone and has no pets. Her work involves quite a bit of travel.     ADVANCED DIRECTIVES: not in place   HEALTH MAINTENANCE:     Social History   Tobacco Use   Smoking status: Never Smoker   Smokeless tobacco: Never Used  Substance Use Topics   Alcohol use: No    Alcohol/week: 0.0 standard drinks    Frequency: Never    Comment: rare   Drug use: No      Colonoscopy: December 2013 Collene Mares)  PAP: Feb 2011  Bone density: 06/2009, normal  Cholesterol: "good"    Allergies  Allergen Reactions   Pollen Extract    Adhesive [Tape] Dermatitis and Rash    Paper tape is better to use    MEDICATIONS:   Current Outpatient Medications  Medication Sig Dispense Refill   acetaminophen (TYLENOL) 500 MG tablet Take 500 mg by mouth every 6 (six) hours as needed for moderate pain or headache.     acyclovir ointment (ZOVIRAX) 5 % Apply 1 application topically every 3 (three) hours. As needed 5 g 5   anastrozole (ARIMIDEX) 1 MG tablet Take 1 tablet (1 mg total) by mouth daily. 90 tablet 4   Cholecalciferol (VITAMIN D3) 2000 UNITS capsule Take 2,000 Units by mouth daily.       fexofenadine (ALLEGRA) 180 MG tablet Take 180 mg by mouth daily as needed for allergies or rhinitis.     fluocinolone (SYNALAR) 0.01 % external solution Apply 1 application topically 2 (two) times daily as needed (irritation).      ibuprofen (ADVIL,MOTRIN) 200 MG tablet Take 200 mg by mouth every 6 (six) hours as needed for moderate pain.     ketoconazole (NIZORAL) 2 % shampoo Apply 1 application topically 2 (two) times a week.     levothyroxine (SYNTHROID) 75 MCG tablet TAKE 1 TABLET(75 MCG) BY MOUTH DAILY BEFORE BREAKFAST 90 tablet 0   losartan (COZAAR) 100 MG tablet TAKE 1 TABLET(100 MG) BY MOUTH DAILY 90 tablet 0   Olopatadine HCl (PAZEO) 0.7 % SOLN Place 1  drop into both eyes daily as needed (eye allergies).      Olopatadine HCl 0.6 % SOLN Place 2 sprays into both nostrils 2 (two) times daily as needed (allergies).      Polyethyl Glycol-Propyl Glycol (SYSTANE OP) Place 1 drop into both eyes daily as needed (for allergies).      sodium chloride (OCEAN) 0.65 % SOLN nasal spray Place 1 spray into both nostrils as needed for congestion.     Turmeric 500 MG CAPS Take 500 mg by mouth daily.      No current facility-administered medications for this visit.     OBJECTIVE:  Middle-aged white woman in no acute distress    Vitals:   03/26/19 1251  BP: 136/65  Pulse: 84  Resp: 18  Temp: 97.8 F (36.6 C)  SpO2: 100%     Body mass index is 26.99 kg/m.   ECOG FS: 1 - Symptomatic but completely ambulatory  Sclerae unicteric, EOMs intact Wearing a mask No cervical or supraclavicular adenopathy Lungs no rales or rhonchi Heart regular rate and rhythm Abd soft, nontender, positive bowel sounds MSK no focal spinal tenderness, no lymphedema Neuro: nonfocal, well oriented, appropriate affect Breasts: Status post bilateral mastectomies  LABS:           Chemistry      Component Value Date/Time   NA 138 02/12/2019 1144   NA 139 02/28/2017 1521   K 4.3 02/12/2019 1144   K 4.6 02/28/2017 1521   CL 103 02/12/2019 1144   CL 105 08/10/2012 0944   CO2 26 02/12/2019 1144   CO2 29 02/28/2017 1521   BUN 19 02/12/2019 1144   BUN 20.0 02/28/2017 1521   CREATININE 0.92 02/12/2019 1144   CREATININE 1.0 02/28/2017 1521      Component Value Date/Time   CALCIUM 9.5 02/12/2019 1144   CALCIUM 10.4 02/28/2017 1521   ALKPHOS 116 02/12/2019 1144   ALKPHOS 62 02/28/2017 1521   AST 17 02/12/2019 1144   AST 18 02/28/2017 1521   ALT 12 02/12/2019 1144   ALT 13 02/28/2017 1521   BILITOT 1.6 (H) 02/12/2019 1144   BILITOT 1.17 02/28/2017 1521         Lab Results  Component Value Date   WBC 5.0 04-04-19   HGB 15.0 04/04/19   HCT 44.3  04-04-2019   MCV 86.7 04-04-19   PLT 225 Apr 04, 2019   NEUTROABS 2.7 2019-04-04    STUDIES  Ct Biopsy  Result Date: April 04, 2019 CLINICAL DATA:  History of breast carcinoma with solitary bone lesion in the right ischial tuberosity demonstrating increased metabolic activity by PET scan. This was previously sampled in 2016 without evidence for malignancy at that time. EXAM: CT GUIDED CORE BIOPSY OF RIGHT ISCHIAL BONE LESION ANESTHESIA/SEDATION: 2.0 mg IV Versed; 100 mcg IV Fentanyl Total Moderate Sedation Time:  26 minutes. The patient's level of consciousness and physiologic status were continuously monitored during the procedure by Radiology nursing. PROCEDURE: The procedure risks, benefits, and alternatives were explained to the patient. Questions regarding the procedure were encouraged and answered. The patient understands and consents to the procedure. The right gluteal region was prepped with chlorhexidine in a sterile fashion, and a sterile drape was applied covering the operative field. A sterile gown and sterile gloves were used for the procedure. Local anesthesia was provided with 1% Lidocaine. CT was performed in a prone position. After localizing the right ischial tuberosity lesion, a 22 gauge needle was advanced to bone to provide deep anesthetic. A 10 gauge OnControl bone biopsy needle was then advanced through the cortex and a core biopsy then performed through the right ischial tuberosity. The core biopsy sample was submitted in formalin. COMPLICATIONS: None FINDINGS: The right ischial tuberosity was targeted at the level of bone lesion seen by prior MRI and PET scan. Solid tissue was obtained with core biopsy. IMPRESSION: CT-guided core biopsy performed of right ischial tuberosity bone lesion. Electronically Signed   By: Aletta Edouard M.D.   On: April 04, 2019 13:55    ASSESSMENT: 58 y.o.  BRCA negative [place] woman ,  (1) status post bilateral mastectomies January 2010 for a  left-sided upper outer quadrant T2 N0 (stage IIA) invasive ductal carcinoma, grade 1, strongly estrogen and progesterone receptor positive, HER-2 nonamplified,   (2) Oncotype  DX recurrence score of 17, predicting a 10-12% distant recurrence risk over 10 years if her only adjuvant treatment is tamoxifen for 5 years  (3) status post Left postmastectomy radiation including Left supraclavicular fossa (5040 cGy in 28 sessions)  (4) on tamoxifen starting February of 2010, stopped May 2015 because of endometrial hyperplasia  (5) osteopenia-- T score -1.2 on repeated bone density scans 09/24/2011 and 12/12/2013  (a) bone density 01/30/2018 showed a T score of +0.1   RECURRENT DISEASE (6) skin biopsy of a lesion under the left breast scar 04/21/2014 shows adenocarcinoma, gross cystic disease fluid protein, estrogen and progesterone receptor positive  (a) s/p wide excision with negative though close margins 06/01/2014, prognostic panel pending  (b) radiation to Left chest wall 4000 cGy in 20 sessions, left schest wall boost 1000 cGy in 5 session 07/12/2014 through 08/16/2014  (7) PET scan 05/06/2014 and bone scan show only one additional area of concern, in the right ischiial tuberosity--  (a) biopsy of the right ischial tuberosity 05/24/2014 showed no evidence of malignancy  (b) zolendronate started 06/06/2014, poorly tolerated  (c) denosumab/ Xgeva started 09/26/2014, switched to Prolia as of May 2017  (d) repeat bone scan 12/20/2014 shows no new/additional areas of disease  (e) restaging studies 07/07/2015 do not support a diagnosis of metastatic disease.  (f) MRI of the pelvis 04/13/2016 and 03/19/2018 shows the lesion in the right ischial tuberosity to be unchanged  (g) pelvic MRI 02/12/2019 shows slight growth and possible increased activity of the ischial lesion  (h) PET scan 02/22/2019 shows no other areas of uptake.  The right ischial lesion area is possibly slightly progressed as  compared to prior  (i) repeat biopsy of the  (8) goserelin monthly started 05/09/2016, last dose 02/20/2015  (a) s/p BSO 03/13/2015 with benign pathology  (9) anastrozole started  08/29/2014  (a) bone density 12/13/2015 shows improvement, with the lumbar spine T score now at 0.1, the femoral neck -1.1, both increased  (b) continues on denosumab/Prolia most recent dose 09/05/2017  (c) repeat bone density 01/30/2018 shows a T score of 0.1  (d) denosumab/Prolia discontinued after 09/05/2017 dose   PLAN: Erin [name] is now nearly 11 years out from initial diagnosis of breast cancer, and 5 years out from definite evidence of disease recurrence.  She continues on anastrozole and the plan will be for total of 7 years on that medication.  Her only site of presumed metastatic disease has been a spot in the right ischial tuberosity noted by PET scan January 2016.  We biopsied it at that time and that biopsy was negative.  We have reviewed that biopsy recently and Dr. Gari Crown feels it was inadequate.  Erin [name] was on denosumab for her osteopenia and also for that lesion and the lesion was stable for 4 years, with no other treatment.  However since we stopped the denosumab a year and a half ago the lesion has grown some so we proceeded to rebiopsy.  Thanks to Dr. Charlotte Sanes extensive work, we were able to document small clusters of epithelial cells which are GATA positive and cytokeratin positive although gross cystic disease fluid protein and estrogen and progesterone receptor negative.  I have to believe that this is her breast cancer.  I have no explanation why she has had no other site of disease in nearly 5 years.  Nevertheless given that this is an oligo metastatic site, I would vote for adjuvant radiation and I am referring her to radiation oncology with that in mind.  Once she completes radiation we will add denosumab back.  Erin [name] has a good understanding of this plan.  She agrees to it.  It is difficult for  her to accept that she now has stage IV disease, but it is a strange kind of stage IV disease and I am hoping for very long survival here.  She knows to call for any other issue that may develop before the next visit.     Leisel Pinette, [name], MD  03/26/19 3:27 PM Medical Oncology and Hematology [facility] [place], [place] [address] Tel. [phone]    Fax. [phone]    I, Wilburn Mylar, am acting as scribe for Dr. [name]. Corita Allinson.  I, Lurline Del MD, have reviewed the above documentation for accuracy and completeness, and I agree with the above.

## 2019-03-26 ENCOUNTER — Ambulatory Visit: Payer: BC Managed Care – PPO | Admitting: Oncology

## 2019-03-26 ENCOUNTER — Ambulatory Visit: Payer: BC Managed Care – PPO | Admitting: Obstetrics & Gynecology

## 2019-03-26 ENCOUNTER — Inpatient Hospital Stay: Payer: BC Managed Care – PPO | Attending: Oncology | Admitting: Oncology

## 2019-03-26 ENCOUNTER — Encounter: Payer: Self-pay | Admitting: Obstetrics & Gynecology

## 2019-03-26 ENCOUNTER — Other Ambulatory Visit: Payer: Self-pay

## 2019-03-26 VITALS — BP 130/88 | Ht 64.25 in | Wt 161.0 lb

## 2019-03-26 VITALS — BP 136/65 | HR 84 | Temp 97.8°F | Resp 18 | Ht 65.0 in | Wt 162.2 lb

## 2019-03-26 DIAGNOSIS — Z01419 Encounter for gynecological examination (general) (routine) without abnormal findings: Secondary | ICD-10-CM | POA: Diagnosis not present

## 2019-03-26 DIAGNOSIS — C7951 Secondary malignant neoplasm of bone: Secondary | ICD-10-CM

## 2019-03-26 DIAGNOSIS — Z79811 Long term (current) use of aromatase inhibitors: Secondary | ICD-10-CM | POA: Diagnosis not present

## 2019-03-26 DIAGNOSIS — Z171 Estrogen receptor negative status [ER-]: Secondary | ICD-10-CM | POA: Insufficient documentation

## 2019-03-26 DIAGNOSIS — M8588 Other specified disorders of bone density and structure, other site: Secondary | ICD-10-CM

## 2019-03-26 DIAGNOSIS — Z9013 Acquired absence of bilateral breasts and nipples: Secondary | ICD-10-CM | POA: Diagnosis not present

## 2019-03-26 DIAGNOSIS — C50912 Malignant neoplasm of unspecified site of left female breast: Secondary | ICD-10-CM

## 2019-03-26 DIAGNOSIS — Z780 Asymptomatic menopausal state: Secondary | ICD-10-CM

## 2019-03-26 DIAGNOSIS — C50412 Malignant neoplasm of upper-outer quadrant of left female breast: Secondary | ICD-10-CM | POA: Diagnosis not present

## 2019-03-26 DIAGNOSIS — Z7189 Other specified counseling: Secondary | ICD-10-CM

## 2019-03-26 DIAGNOSIS — M8580 Other specified disorders of bone density and structure, unspecified site: Secondary | ICD-10-CM | POA: Diagnosis not present

## 2019-03-26 DIAGNOSIS — Z170 Estrogen receptor positive status [ER+]: Secondary | ICD-10-CM

## 2019-03-26 DIAGNOSIS — M85851 Other specified disorders of bone density and structure, right thigh: Secondary | ICD-10-CM

## 2019-03-26 NOTE — Progress Notes (Signed)
Erin [name] 08/08/1960 [id number]   History:    58 y.o. G0 Single  RP:  Established patient presenting for annual gyn exam   HPI: Surgical menopause status post laparoscopic bilateral salpingo-oophorectomy November 2016. 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  Status post bilateral mastectomy.A PET scan in May 06, 2014 showed a 1.9 cm sclerotic lesion in the right ischial tuberosity.Followed by MRIs of the pelvis. MRI 02/12/19 Increase in size of the ischial tuberosity lesion.  She is on Arimidex. Her bone density showed improvement of osteopenia on Prolia 01/2018. No current pelvic pain. No postmenopausal bleeding. Abstinent currently. Very mild menopausal symptoms. Urine and bowel movements normal.  BMI 27.42.  Physically active.   Past medical history,surgical history, family history and social history were all reviewed and documented in the EPIC chart.  Gynecologic History No LMP recorded (lmp unknown). Patient is postmenopausal. Contraception: status post hysterectomy Last Pap: 02/2018. Results were: Negative/HPV HR negative Last mammogram: S/P Bilateral Mastectomy Bone Density: 01/2018 Back on Prolia Colonoscopy: 2013  Obstetric History OB History  Gravida Para Term Preterm AB Living  0 0 0 0 0 0  SAB TAB Ectopic Multiple Live Births  0 0 0 0 0     ROS: A ROS was performed and pertinent positives and negatives are included in the history.  GENERAL: No fevers or chills. HEENT: No change in vision, no earache, sore throat or sinus congestion. NECK: No pain or stiffness. CARDIOVASCULAR: No chest pain or pressure. No palpitations. PULMONARY: No shortness of breath, cough or wheeze. GASTROINTESTINAL: No abdominal pain, nausea, vomiting or diarrhea, melena or bright red blood per rectum. GENITOURINARY: No  urinary frequency, urgency, hesitancy or dysuria. MUSCULOSKELETAL: No joint or muscle pain, no back pain, no recent trauma. DERMATOLOGIC: No rash, no itching, no lesions. ENDOCRINE: No polyuria, polydipsia, no heat or cold intolerance. No recent change in weight. HEMATOLOGICAL: No anemia or easy bruising or bleeding. NEUROLOGIC: No headache, seizures, numbness, tingling or weakness. PSYCHIATRIC: No depression, no loss of interest in normal activity or change in sleep pattern.     Exam:   BP 130/88   Ht 5' 4.25" (1.632 m)   Wt 161 lb (73 kg)   LMP  (LMP Unknown)   BMI 27.42 kg/m   Body mass index is 27.42 kg/m.  General appearance : Well developed well nourished female. No acute distress HEENT: Eyes: no retinal hemorrhage or exudates,  Neck supple, trachea midline, no carotid bruits, no thyroidmegaly Lungs: Clear to auscultation, no rhonchi or wheezes, or rib retractions  Heart: Regular rate and rhythm, no murmurs or gallops Breast:Examined in sitting and supine position were symmetrical in appearance, no palpable masses or tenderness,  no skin retraction, no nipple inversion, no nipple discharge, no skin discoloration, no axillary or supraclavicular lymphadenopathy Abdomen: no palpable masses or tenderness, no rebound or guarding Extremities: no edema or skin discoloration or tenderness  Pelvic: Vulva: Normal             Vagina: No gross lesions or discharge  Cervix: No gross lesions or discharge.  Pap reflex done.  Uterus AV, normal size, shape and consistency, non-tender and mobile  Adnexa  Without masses or tenderness  Anus: Normal  MRI of Pelvis: Abnormal bone lesion measuring 2.4 x 2.3 x 2.2 cm in the right ischial tuberosity slightly larger and more conspicuous compared with  the prior examinations most concerning for malignancy. 2. No other abnormal bone lesions.  Assessment/Plan:  58 y.o. female for annual exam   1. Encounter for routine gynecological examination with  Papanicolaou smear of cervix Normal gynecologic exam.  Pap reflex done.  Status post bilateral mastectomy.  Health labs with family physician.  Colonoscopy in 2013.  Good body mass index at 27.42.  Continue with fitness and healthy nutrition as much as possible.  2. Postmenopausal Well on no hormone replacement therapy.  No postmenopausal bleeding.  3. Osteopenia of neck of right femur Starting back on Prolia.  We will continue with calcium at 1200 mg daily and vitamin D supplements.  Continue with weightbearing physical activities.  4. Recurrent cancer of left breast (HCC) Metastatic lesion at the Rt Ischial Tuberosity.  Will receive Radiation Therapy.  Followed by Dr. [name].   Princess Bruins MD, 4:06 PM 03/26/2019

## 2019-03-28 ENCOUNTER — Encounter: Payer: Self-pay | Admitting: Obstetrics & Gynecology

## 2019-03-28 NOTE — Patient Instructions (Signed)
1. Encounter for routine gynecological examination with Papanicolaou smear of cervix Normal gynecologic exam.  Pap reflex done.  Status post bilateral mastectomy.  Health labs with family physician.  Colonoscopy in 2013.  Good body mass index at 27.42.  Continue with fitness and healthy nutrition as much as possible.  2. Postmenopausal Well on no hormone replacement therapy.  No postmenopausal bleeding.  3. Osteopenia of neck of right femur Starting back on Prolia.  We will continue with calcium at 1200 mg daily and vitamin D supplements.  Continue with weightbearing physical activities.  4. Recurrent cancer of left breast (HCC) Metastatic lesion at the Rt Ischial Tuberosity.  Will receive Radiation Therapy.  Followed by Dr. [name].  Erin [name], it was a pleasure seeing you today!  I will inform you of your results as soon as they are available.

## 2019-03-29 ENCOUNTER — Telehealth: Payer: Self-pay | Admitting: Oncology

## 2019-03-29 LAB — PAP IG W/ RFLX HPV ASCU

## 2019-03-29 NOTE — Telephone Encounter (Signed)
I left patient a message regarding schedule

## 2019-03-30 ENCOUNTER — Other Ambulatory Visit: Payer: Self-pay | Admitting: *Deleted

## 2019-03-30 NOTE — Progress Notes (Signed)
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, grade 1, strongly estrogen and progesterone receptor positive, HER-2 nonamplified,   RECURRENT DISEASE  skin biopsy of a lesion under the left breast scar 04/21/2014 shows adenocarcinoma, gross cystic disease fluid protein, estrogen and progesterone receptor positive             (a) s/p wide excision with negative though close margins 06/01/2014, prognostic panel pending             (b) radiation to Left chest wall 4000 cGy in 20 sessions, left schest wall boost 1000 cGy in 5 session 07/12/2014 through 08/16/2014   Sites of Visceral and Bony Metastatic Disease:  PET scan 02/22/2019 shows no other areas of uptake.  The right ischial lesion area is possibly slightly progressed as compared to prior  03/15/19 Biopsy FINAL MICROSCOPIC DIAGNOSIS: A. BONE, RIGHT ISCHIAL, BIOPSY: - Metastatic carcinoma  Location(s) of Symptomatic Metastases: Right Ischial tuberosity  Past/Anticipated chemotherapy by medical oncology, if any:  03/26/19 Dr. [name]  PLAN: Erin [name] is now nearly 11 years out from initial diagnosis of breast cancer, and 5 years out from definite evidence of disease recurrence.  She continues on anastrozole and the plan will be for total of 7 years on that medication.  Her only site of presumed metastatic disease has been a spot in the right ischial tuberosity noted by PET scan January 2016.  We biopsied it at that time and that biopsy was negative.  We have reviewed that biopsy recently and Dr. Gari Crown feels it was inadequate.  Erin [name] was on denosumab for her osteopenia and also for that lesion and the lesion was stable for 4 years, with no other treatment.  However since we stopped the denosumab a year and a half ago the lesion has grown some so we proceeded to rebiopsy.  Thanks to Dr. Charlotte Sanes extensive work, we  were able to document small clusters of epithelial cells which are GATA positive and cytokeratin positive although gross cystic disease fluid protein and estrogen and progesterone receptor negative.  I have to believe that this is her breast cancer.  I have no explanation why she has had no other site of disease in nearly 5 years.  Nevertheless given that this is an oligo metastatic site, I would vote for adjuvant radiation and I am referring her to radiation oncology with that in mind.  Once she completes radiation we will add denosumab back.  Erin [name] has a good understanding of this plan.  She agrees to it.  It is difficult for her to accept that she now has stage IV disease, but it is a strange kind of stage IV disease and I am hoping for very long survival here.  She knows to call for any other issue that may develop before the next visit.   Pain on a scale of 0-10 is: She reports soreness at her biopsy site.    If Spine Met(s), symptoms, if any, include: N/A  Bowel/Bladder retention or incontinence (please describe):   Numbness or weakness in extremities (please describe):   Current Decadron regimen, if applicable: N/A  Ambulatory status? Walker? Wheelchair?:   SAFETY ISSUES:  Prior radiation? Yes. She reports receiving radiation in 2010, in [place] to her left chest area.   Yes, 07/12/2014- 08/16/2014  Left chest wall 4000 cGy in 20 sessions, left chest wall boost 1000 cGy in 5 sessions  Pacemaker/ICD?  No  Possible current pregnancy? No  Is the patient on methotrexate? No  Current Complaints / other details:

## 2019-03-31 ENCOUNTER — Encounter: Payer: Self-pay | Admitting: Radiation Oncology

## 2019-03-31 ENCOUNTER — Other Ambulatory Visit: Payer: Self-pay

## 2019-03-31 ENCOUNTER — Ambulatory Visit
Admission: RE | Admit: 2019-03-31 | Discharge: 2019-03-31 | Disposition: A | Discharge status: Home / Self Care | Payer: BC Managed Care – PPO | Source: Ambulatory Visit | Attending: Radiation Oncology | Admitting: Radiation Oncology

## 2019-03-31 DIAGNOSIS — C7951 Secondary malignant neoplasm of bone: Secondary | ICD-10-CM

## 2019-03-31 NOTE — Progress Notes (Signed)
Radiation Oncology         [phone] ________________________________  Initial outpatient Consultation by MyChart Video  Name: Erin [name] MRN: [id number]  Date: 03/31/2019  DOB: 10-Jul-1960  YI:FOYDXAJO, Real Cons, MD  [name], [name], MD   REFERRING PHYSICIAN: [name], [name], MD  DIAGNOSIS:    ICD-10-CM   1. Bone metastases ([place])  C79.51      Cancer Staging Malignant neoplasm of upper-outer quadrant of left breast in female, estrogen receptor positive ([place]) Staging form: Breast, AJCC 7th Edition - Clinical: Stage IV ([place], NX, M1) - Signed by Chauncey Cruel, MD on 07/04/2014  Recurrent cancer of left breast [facility]) Staging form: Breast, AJCC 7th Edition - Clinical: Stage IV (Center, NX, M1) - Signed by Chauncey Cruel, MD on 07/04/2014   CHIEF COMPLAINT: Here to discuss management of recurrent breast cancer  HISTORY OF PRESENT ILLNESS::Erin [name] is a 58 y.o. female who was initially diagnosed with breast cancer in 04/2008 while living in [place]. In summary, a left breast abnormality was seen on routine screening mammography in 03/2008. After a complex series of biopsies, she opted to proceed with bilateral mastectomies on 06/27/2008. Pathology revealed: benign right breast; in the left breast, there was a 3.2 cm invasive ductal carcinoma, grade 1, focally extending to the anterior margin of the lower outer quadrant;  extensive angiolymphatic invasion, but both sentinel lymph nodes on the left were negative; ER positive, PR positive, Her2 negative (Stage IIA, T2 N0). Oncotype DX recurrence score was 17. She received post-mastectomy radiation to the left chest wall and left supraclavicular fossa (see below). She was also tested for BRCA 1 and 2 and was found to be negative. She took tamoxifen for 5 years with no evidence of active disease during that time.  In 02/2014, she was found to have a change in a small area of scaring her left inframammary fold. She was  referred to dermatology and biopsy 04/21/2014 showed recurrent ductal adenocarcinoma. PET scan performed on 05/06/2014 showed a 1.9 cm sclerotic lesion in the right ischial tuberosity. Biopsy of the lesion on 05/24/2014 was benign. She proceeded to re-treatment of the left chest wall in 06/2014 under Dr. Valere Dross. She was started on anastrozole in 08/2014, which she is still currently taking.  From 2016-2019 she received anastrozole, denosumab and Prolia.   Denosumab and Prolia were stopped in 08/2017.  The right ischial tuberosity lesion remained unchanged through 2019. Pelvic MRI performed on 02/12/2019 showed slight growth and possible increased activity of the ischial lesion. PET scan performed on 02/22/2019 showed possible progression of the lesion. No new foci of increased uptake identified to suggest progression of disease.  She proceeded to biopsy of this area on 03/15/2019. Pathology from the procedure confirmed metastatic carcinoma, likely of breast primary. Estrogen and progesterone receptors were both negative.  She is having some post biopsy pain in the tumor site (ischium). She is doing well overall, and is a professor at [employer]. ECOG 0-1.  PREVIOUS RADIATION THERAPY: Yes  2010 (in [place]): 1. Left Chest Wall + SCV / 50.4 Gy in 28 fractions 2. Mastectomy scar boost / 10 Gy in 5 fractions 06/22/2014 - 08/16/2014 (Dr. Valere Dross):  1. Left Chest Wall / 40 Gy in 20 fractions 2. Left Chest Wall Boost / 10 Gy in 5 fractions  PAST MEDICAL HISTORY:  has a past medical history of Allergic rhinitis due to pollen, Breast cancer ([place]) (2010), Breast cancer ([place]) (2016), Hypertension, Hypothyroidism, Osteopenia, S/P radiation therapy (07/12/2014  through 08/16/2014 ), and Wears glasses.    PAST SURGICAL HISTORY: Past Surgical History:  Procedure Laterality Date   BREAST LUMPECTOMY Left 06/01/2014   Procedure: WIDE LOCAL EXCISION OF RECURRENT LEFT BREAST  CANCER;  Surgeon: Rolm Bookbinder, MD;  Location: [facility];  Service: General;  Laterality: Left;   COLONOSCOPY     cyst removed     left lower jaw - at age 74 yr   [place] N/A 09/16/2013   Procedure: [place];  Surgeon: Princess Bruins, MD;  Location: [facility];  Service: Gynecology;  Laterality: N/A;  1 hr.   INGUINAL HERNIA REPAIR     right side as infant   LAPAROSCOPIC PELVIC LYMPH NODE BIOPSY     MASTECTOMY  2010   bilateral mastectomies-lt snbx-radiation post   MASTECTOMY Bilateral    OOPHORECTOMY  2016   patient denies   Right knee arthroscopy  2008   ROBOTIC ASSISTED BILATERAL SALPINGO OOPHERECTOMY Bilateral 03/13/2015   Procedure: ROBOTIC ASSISTED BILATERAL SALPINGO OOPHORECTOMY With [place];  Surgeon: Princess Bruins, MD;  Location: [facility];  Service: Gynecology;  Laterality: Bilateral;   TONSILLECTOMY     WISDOM TOOTH EXTRACTION      FAMILY HISTORY: family history includes Cancer in her mother; Hypertension in her brother, father, and mother; Stroke in her maternal grandfather and paternal grandfather.  SOCIAL HISTORY:  reports that she has never smoked. She has never used smokeless tobacco. She reports that she does not drink alcohol or use drugs.  ALLERGIES: Pollen extract and Adhesive [tape]  MEDICATIONS:  Current Outpatient Medications  Medication Sig Dispense Refill   acetaminophen (TYLENOL) 500 MG tablet Take 500 mg by mouth every 6 (six) hours as needed for moderate pain or headache.     acyclovir ointment (ZOVIRAX) 5 % Apply 1 application topically every 3 (three) hours. As needed 5 g 5   anastrozole (ARIMIDEX) 1 MG tablet Take 1 tablet (1 mg total) by mouth daily. 90 tablet 4   Cholecalciferol (VITAMIN D3) 2000 UNITS capsule Take 2,000 Units by mouth daily.       fexofenadine (ALLEGRA) 180 MG tablet Take 180 mg by mouth daily as needed for  allergies or rhinitis.     fluocinolone (SYNALAR) 0.01 % external solution Apply 1 application topically 2 (two) times daily as needed (irritation).      ibuprofen (ADVIL,MOTRIN) 200 MG tablet Take 200 mg by mouth every 6 (six) hours as needed for moderate pain.     ketoconazole (NIZORAL) 2 % shampoo Apply 1 application topically 2 (two) times a week.     levothyroxine (SYNTHROID) 75 MCG tablet TAKE 1 TABLET(75 MCG) BY MOUTH DAILY BEFORE BREAKFAST 90 tablet 0   losartan (COZAAR) 100 MG tablet TAKE 1 TABLET(100 MG) BY MOUTH DAILY 90 tablet 0   Olopatadine HCl (PAZEO) 0.7 % SOLN Place 1 drop into both eyes daily as needed (eye allergies).      Olopatadine HCl 0.6 % SOLN Place 2 sprays into both nostrils 2 (two) times daily as needed (allergies).      Polyethyl Glycol-Propyl Glycol (SYSTANE OP) Place 1 drop into both eyes daily as needed (for allergies).      sodium chloride (OCEAN) 0.65 % SOLN nasal spray Place 1 spray into both nostrils as needed for congestion.     Turmeric 500 MG CAPS Take 500 mg by mouth daily.      No current facility-administered medications for this encounter.  REVIEW OF SYSTEMS:  Notable for that above.   PHYSICAL EXAM:  vitals were not taken for this visit.     LABORATORY DATA:  Lab Results  Component Value Date   WBC 5.0 03/15/2019   HGB 15.0 03/15/2019   HCT 44.3 03/15/2019   MCV 86.7 03/15/2019   PLT 225 03/15/2019   CMP     Component Value Date/Time   NA 138 02/12/2019 1144   NA 139 02/28/2017 1521   K 4.3 02/12/2019 1144   K 4.6 02/28/2017 1521   CL 103 02/12/2019 1144   CL 105 08/10/2012 0944   CO2 26 02/12/2019 1144   CO2 29 02/28/2017 1521   GLUCOSE 90 02/12/2019 1144   GLUCOSE 90 02/28/2017 1521   GLUCOSE 89 08/10/2012 0944   BUN 19 02/12/2019 1144   BUN 20.0 02/28/2017 1521   CREATININE 0.92 02/12/2019 1144   CREATININE 1.0 02/28/2017 1521   CALCIUM 9.5 02/12/2019 1144   CALCIUM 10.4 02/28/2017 1521   PROT 6.8 02/12/2019  1144   PROT 6.8 02/28/2017 1521   ALBUMIN 3.9 02/12/2019 1144   ALBUMIN 3.9 02/28/2017 1521   AST 17 02/12/2019 1144   AST 18 02/28/2017 1521   ALT 12 02/12/2019 1144   ALT 13 02/28/2017 1521   ALKPHOS 116 02/12/2019 1144   ALKPHOS 62 02/28/2017 1521   BILITOT 1.6 (H) 02/12/2019 1144   BILITOT 1.17 02/28/2017 1521   GFRNONAA >60 02/12/2019 1144   GFRAA >60 02/12/2019 1144         RADIOGRAPHY: CT Biopsy  Result Date: 03/15/2019 CLINICAL DATA:  History of breast carcinoma with solitary bone lesion in the right ischial tuberosity demonstrating increased metabolic activity by PET scan. This was previously sampled in 2016 without evidence for malignancy at that time. EXAM: CT GUIDED CORE BIOPSY OF RIGHT ISCHIAL BONE LESION ANESTHESIA/SEDATION: 2.0 mg IV Versed; 100 mcg IV Fentanyl Total Moderate Sedation Time:  26 minutes. The patient's level of consciousness and physiologic status were continuously monitored during the procedure by Radiology nursing. PROCEDURE: The procedure risks, benefits, and alternatives were explained to the patient. Questions regarding the procedure were encouraged and answered. The patient understands and consents to the procedure. The right gluteal region was prepped with chlorhexidine in a sterile fashion, and a sterile drape was applied covering the operative field. A sterile gown and sterile gloves were used for the procedure. Local anesthesia was provided with 1% Lidocaine. CT was performed in a prone position. After localizing the right ischial tuberosity lesion, a 22 gauge needle was advanced to bone to provide deep anesthetic. A 10 gauge OnControl bone biopsy needle was then advanced through the cortex and a core biopsy then performed through the right ischial tuberosity. The core biopsy sample was submitted in formalin. COMPLICATIONS: None FINDINGS: The right ischial tuberosity was targeted at the level of bone lesion seen by prior MRI and PET scan. Solid tissue was  obtained with core biopsy. IMPRESSION: CT-guided core biopsy performed of right ischial tuberosity bone lesion. Electronically Signed   By: Aletta Edouard M.D.   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 We discussed the patient's diagnosis of slowly growing oligometastatic disease in the right ischium and general treatment for this, highlighting the role of radiotherapy in the management. She has an excellent performance status and  has remained free of other sites distant metastatic disease for years, other than this lesion in the right ischium.  She has an excellent prognosis compared to most patients with metastatic breast cancer and it is warranted to give aggressive local therapy to the single site of known metastatic disease. We discussed the available radiation techniques, and focused on the details of logistics and delivery.  I recommend she received SBRT to this lesion.   We discussed the risks, benefits, and side effects of stereotactic body radiation therapy. Side effects may include but not necessarily be limited to: Fatigue, pain, mild skin irritation, bone damage, soft tissue damage.  No guarantees of treatment were given. The patient was encouraged to ask questions that I answered to the best of my ability.   We will proceed with CT simulation in the near future.  She is enthusiastic about this plan.    This encounter was provided by telemedicine platform MyChart Video due to pandemic risks. The patient has given verbal consent for this type of encounter and has been advised to only accept a meeting of this type in a secure network environment. The time spent during this encounter was over 60 minutes. The attendants for this meeting include Eppie Gibson  and [name].  During the encounter, Eppie Gibson was  located at [facility] Radiation Oncology Department.  [place] was located at home.   __________________________________________   Eppie Gibson, MD   This document serves as a record of services personally performed by Eppie Gibson, MD. It was created on her behalf by Wilburn Mylar, a trained medical scribe. The creation of this record is based on the scribe's personal observations and the provider's statements to them. This document has been checked and approved by the attending provider.

## 2019-04-02 ENCOUNTER — Encounter: Payer: Self-pay | Admitting: Radiation Oncology

## 2019-04-06 ENCOUNTER — Encounter: Payer: Self-pay | Admitting: Internal Medicine

## 2019-04-06 LAB — COLOGUARD: Cologuard: NEGATIVE

## 2019-04-06 NOTE — Progress Notes (Signed)
Abstracted and sent to scan  

## 2019-04-07 ENCOUNTER — Inpatient Hospital Stay: Payer: BC Managed Care – PPO

## 2019-04-07 ENCOUNTER — Other Ambulatory Visit: Payer: Self-pay

## 2019-04-07 ENCOUNTER — Ambulatory Visit
Admission: RE | Admit: 2019-04-07 | Discharge: 2019-04-07 | Disposition: A | Discharge status: Home / Self Care | Payer: BC Managed Care – PPO | Source: Ambulatory Visit | Attending: Radiation Oncology | Admitting: Radiation Oncology

## 2019-04-07 DIAGNOSIS — C50412 Malignant neoplasm of upper-outer quadrant of left female breast: Secondary | ICD-10-CM

## 2019-04-07 DIAGNOSIS — C7951 Secondary malignant neoplasm of bone: Secondary | ICD-10-CM | POA: Diagnosis present

## 2019-04-07 DIAGNOSIS — Z170 Estrogen receptor positive status [ER+]: Secondary | ICD-10-CM | POA: Insufficient documentation

## 2019-04-07 LAB — CMP (CANCER CENTER ONLY)
ALT: 11 U/L (ref 0–44)
AST: 17 U/L (ref 15–41)
Albumin: 4.2 g/dL (ref 3.5–5.0)
Alkaline Phosphatase: 120 U/L (ref 38–126)
Anion gap: 8 (ref 5–15)
BUN: 21 mg/dL — ABNORMAL HIGH (ref 6–20)
CO2: 27 mmol/L (ref 22–32)
Calcium: 9.3 mg/dL (ref 8.9–10.3)
Chloride: 103 mmol/L (ref 98–111)
Creatinine: 0.93 mg/dL (ref 0.44–1.00)
GFR, Est AFR Am: >60 mL/min (ref >60)
GFR, Estimated: >60 mL/min (ref >60)
Glucose, Bld: 86 mg/dL (ref 70–99)
Potassium: 4.3 mmol/L (ref 3.5–5.1)
Sodium: 138 mmol/L (ref 135–145)
Total Bilirubin: 1.4 mg/dL — ABNORMAL HIGH (ref 0.3–1.2)
Total Protein: 7.1 g/dL (ref 6.5–8.1)

## 2019-04-07 LAB — CBC WITH DIFFERENTIAL (CANCER CENTER ONLY)
Abs Immature Granulocytes: 0.01 10*3/uL (ref 0.00–0.07)
Basophils Absolute: 0.1 10*3/uL (ref 0.0–0.1)
Basophils Relative: 1 %
Eosinophils Absolute: 0.1 10*3/uL (ref 0.0–0.5)
Eosinophils Relative: 2 %
HCT: 44.7 % (ref 36.0–46.0)
Hemoglobin: 15 g/dL (ref 12.0–15.0)
Immature Granulocytes: 0 %
Lymphocytes Relative: 23 %
Lymphs Abs: 1.4 10*3/uL (ref 0.7–4.0)
MCH: 29.3 pg (ref 26.0–34.0)
MCHC: 33.6 g/dL (ref 30.0–36.0)
MCV: 87.3 fL (ref 80.0–100.0)
Monocytes Absolute: 0.5 10*3/uL (ref 0.1–1.0)
Monocytes Relative: 9 %
Neutro Abs: 3.8 10*3/uL (ref 1.7–7.7)
Neutrophils Relative %: 65 %
Platelet Count: 243 10*3/uL (ref 150–400)
RBC: 5.12 MIL/uL — ABNORMAL HIGH (ref 3.87–5.11)
RDW: 11.9 % (ref 11.5–15.5)
WBC Count: 5.9 10*3/uL (ref 4.0–10.5)
nRBC: 0 % (ref 0.0–0.2)

## 2019-04-07 NOTE — Progress Notes (Signed)
     Radiation Oncology         [phone]) [phone] ________________________________  Name: Mayla Teets MRN: [id number]  Date: 04/07/2019  DOB: November 25, 1960  SIMULATION AND TREATMENT PLANNING NOTE    ICD-10-CM   1. Bone metastases ([facility])  C79.51     DIAGNOSIS:  Bone metastasis, right ischium  NARRATIVE:  The patient was brought to the [facility].  Identity was confirmed.  All relevant records and images related to the planned course of therapy were reviewed.  The patient freely provided informed written consent to proceed with treatment after reviewing the details related to the planned course of therapy. The consent form was witnessed and verified by the simulation staff. Intravenous access was established for contrast administration. Then, the patient was set-up in a stable reproducible supine position for radiation therapy.   She was positioned in a blue body bag with vacuum wrap compression.  CT images were obtained.  Surface markings were placed.  The CT images were loaded into the planning software and fused with the patient's targeting MRI scan.  Then the target and avoidance structures were contoured.  Treatment planning then occurred.  The radiation prescription was entered and confirmed.  I have requested 3D planning  I have requested a DVH of the following structures: Right femoral head, right femoral neck, bladder, genitalia and target volumes.  SPECIAL TREATMENT PROCEDURE:  The planned course of therapy using radiation constitutes a special treatment procedure. Special care is required in the management of this patient for the following reasons:  High dose per fraction requiring special monitoring for increased toxicities of treatment including daily imaging.  The special nature of the planned course of radiotherapy will require increased physician supervision and oversight to ensure patient's safety with optimal treatment outcomes.  PLAN:  The patient will receive 50 Gy  in 5 fractions delivered every other day to the right ischial oligometastatic lesion.  ________________________________    Eppie Gibson, MD

## 2019-04-08 ENCOUNTER — Ambulatory Visit (INDEPENDENT_AMBULATORY_CARE_PROVIDER_SITE_OTHER): Payer: BC Managed Care – PPO | Admitting: Internal Medicine

## 2019-04-08 ENCOUNTER — Encounter: Payer: Self-pay | Admitting: Internal Medicine

## 2019-04-08 DIAGNOSIS — J0110 Acute frontal sinusitis, unspecified: Secondary | ICD-10-CM | POA: Diagnosis not present

## 2019-04-08 MED ORDER — AMOXICILLIN-POT CLAVULANATE 875-125 MG PO TABS: 1.0000 | ORAL_TABLET | Freq: Two times a day (BID) | ORAL | 0 refills | Status: DC

## 2019-04-08 NOTE — Progress Notes (Signed)
Virtual Visit via Video Note  I connected with Erin [name] on 04/08/19 at  3:40 PM EST by a video enabled telemedicine application and verified that I am speaking with the correct person using two identifiers.  The patient and the provider were at separate locations throughout the entire encounter.   I discussed the limitations of evaluation and management by telemedicine and the availability of in person appointments. The patient expressed understanding and agreed to proceed. The patient and the provider were the only parties present for the visit unless noted in HPI below.  History of Present Illness: The patient is a 58 y.o. female with visit for likely sinus infection. Started about 2-3 weeks ago with sinus pressure and drainage. Denies fevers or chills. Had recent PET scan and biopsy times 2 with finding of oligo met of her prior breast cancer. Going to have radiation therapy with intention of curative. Has no SOB or cough. Started allergy shots a few months ago which have not helped tons yet. Denies missing allergy medication or shots. Overall it is not worsening but not improving. Has tried otc cold and sinus medication and her normal allergy medications.  Observations/Objective: Appearance: normal, breathing appears normal, casual grooming, abdomen does not appear distended, throat normal, memory normal, mental status is A and O times 3  Assessment and Plan: See problem oriented charting  Follow Up Instructions: rx augmentin 10 day course, will ask her cancer specialist if he can check labs with her normal cancer labs at next follow up  I discussed the assessment and treatment plan with the patient. The patient was provided an opportunity to ask questions and all were answered. The patient agreed with the plan and demonstrated an understanding of the instructions.   The patient was advised to call back or seek an in-person evaluation if the symptoms worsen or if the condition fails to  improve as anticipated.  [name], MD

## 2019-04-09 ENCOUNTER — Other Ambulatory Visit: Payer: Self-pay | Admitting: Oncology

## 2019-04-09 DIAGNOSIS — C50912 Malignant neoplasm of unspecified site of left female breast: Secondary | ICD-10-CM

## 2019-04-09 NOTE — Assessment & Plan Note (Signed)
Rx augmentin. Continue allergy treatment.

## 2019-04-15 DIAGNOSIS — C7951 Secondary malignant neoplasm of bone: Secondary | ICD-10-CM | POA: Diagnosis not present

## 2019-04-21 ENCOUNTER — Other Ambulatory Visit: Payer: Self-pay | Admitting: *Deleted

## 2019-04-21 DIAGNOSIS — C50912 Malignant neoplasm of unspecified site of left female breast: Secondary | ICD-10-CM

## 2019-04-26 ENCOUNTER — Other Ambulatory Visit: Payer: Self-pay

## 2019-04-26 ENCOUNTER — Ambulatory Visit
Admission: RE | Admit: 2019-04-26 | Discharge: 2019-04-26 | Disposition: A | Discharge status: Home / Self Care | Payer: BC Managed Care – PPO | Source: Ambulatory Visit | Attending: Radiation Oncology | Admitting: Radiation Oncology

## 2019-04-26 ENCOUNTER — Encounter: Payer: BC Managed Care – PPO | Admitting: Internal Medicine

## 2019-04-26 ENCOUNTER — Inpatient Hospital Stay: Payer: BC Managed Care – PPO | Attending: Oncology

## 2019-04-26 DIAGNOSIS — M8580 Other specified disorders of bone density and structure, unspecified site: Secondary | ICD-10-CM | POA: Diagnosis present

## 2019-04-26 DIAGNOSIS — Z170 Estrogen receptor positive status [ER+]: Secondary | ICD-10-CM | POA: Insufficient documentation

## 2019-04-26 DIAGNOSIS — C50912 Malignant neoplasm of unspecified site of left female breast: Secondary | ICD-10-CM

## 2019-04-26 DIAGNOSIS — C50412 Malignant neoplasm of upper-outer quadrant of left female breast: Secondary | ICD-10-CM | POA: Insufficient documentation

## 2019-04-26 DIAGNOSIS — C7951 Secondary malignant neoplasm of bone: Secondary | ICD-10-CM | POA: Diagnosis not present

## 2019-04-26 LAB — CBC WITH DIFFERENTIAL (CANCER CENTER ONLY)
Abs Immature Granulocytes: 0.01 10*3/uL (ref 0.00–0.07)
Basophils Absolute: 0 10*3/uL (ref 0.0–0.1)
Basophils Relative: 1 %
Eosinophils Absolute: 0.1 10*3/uL (ref 0.0–0.5)
Eosinophils Relative: 2 %
HCT: 44.1 % (ref 36.0–46.0)
Hemoglobin: 15.2 g/dL — ABNORMAL HIGH (ref 12.0–15.0)
Immature Granulocytes: 0 %
Lymphocytes Relative: 24 %
Lymphs Abs: 1.6 10*3/uL (ref 0.7–4.0)
MCH: 29.4 pg (ref 26.0–34.0)
MCHC: 34.5 g/dL (ref 30.0–36.0)
MCV: 85.3 fL (ref 80.0–100.0)
Monocytes Absolute: 0.6 10*3/uL (ref 0.1–1.0)
Monocytes Relative: 10 %
Neutro Abs: 4 10*3/uL (ref 1.7–7.7)
Neutrophils Relative %: 63 %
Platelet Count: 245 10*3/uL (ref 150–400)
RBC: 5.17 MIL/uL — ABNORMAL HIGH (ref 3.87–5.11)
RDW: 11.9 % (ref 11.5–15.5)
WBC Count: 6.3 10*3/uL (ref 4.0–10.5)
nRBC: 0 % (ref 0.0–0.2)

## 2019-04-26 LAB — CMP (CANCER CENTER ONLY)
ALT: 19 U/L (ref 0–44)
AST: 24 U/L (ref 15–41)
Albumin: 4.3 g/dL (ref 3.5–5.0)
Alkaline Phosphatase: 107 U/L (ref 38–126)
Anion gap: 10 (ref 5–15)
BUN: 20 mg/dL (ref 6–20)
CO2: 26 mmol/L (ref 22–32)
Calcium: 9.5 mg/dL (ref 8.9–10.3)
Chloride: 101 mmol/L (ref 98–111)
Creatinine: 0.88 mg/dL (ref 0.44–1.00)
GFR, Est AFR Am: >60 mL/min (ref >60)
GFR, Estimated: >60 mL/min (ref >60)
Glucose, Bld: 98 mg/dL (ref 70–99)
Potassium: 4.8 mmol/L (ref 3.5–5.1)
Sodium: 137 mmol/L (ref 135–145)
Total Bilirubin: 1.4 mg/dL — ABNORMAL HIGH (ref 0.3–1.2)
Total Protein: 7.4 g/dL (ref 6.5–8.1)

## 2019-04-27 LAB — CANCER ANTIGEN 27.29: CA 27.29: 12.8 U/mL (ref 0.0–38.6)

## 2019-04-28 ENCOUNTER — Ambulatory Visit
Admission: RE | Admit: 2019-04-28 | Discharge: 2019-04-28 | Disposition: A | Discharge status: Home / Self Care | Payer: BC Managed Care – PPO | Source: Ambulatory Visit | Attending: Radiation Oncology | Admitting: Radiation Oncology

## 2019-04-28 ENCOUNTER — Other Ambulatory Visit: Payer: Self-pay

## 2019-04-28 DIAGNOSIS — C7951 Secondary malignant neoplasm of bone: Secondary | ICD-10-CM | POA: Diagnosis not present

## 2019-04-30 ENCOUNTER — Other Ambulatory Visit: Payer: Self-pay

## 2019-04-30 ENCOUNTER — Ambulatory Visit
Admission: RE | Admit: 2019-04-30 | Discharge: 2019-04-30 | Disposition: A | Discharge status: Home / Self Care | Payer: BC Managed Care – PPO | Source: Ambulatory Visit | Attending: Radiation Oncology | Admitting: Radiation Oncology

## 2019-04-30 DIAGNOSIS — C7951 Secondary malignant neoplasm of bone: Secondary | ICD-10-CM | POA: Diagnosis not present

## 2019-05-03 ENCOUNTER — Other Ambulatory Visit: Payer: Self-pay

## 2019-05-03 ENCOUNTER — Ambulatory Visit
Admission: RE | Admit: 2019-05-03 | Discharge: 2019-05-03 | Disposition: A | Discharge status: Home / Self Care | Payer: BC Managed Care – PPO | Source: Ambulatory Visit | Attending: Radiation Oncology | Admitting: Radiation Oncology

## 2019-05-03 ENCOUNTER — Ambulatory Visit: Payer: BC Managed Care – PPO | Admitting: Radiation Oncology

## 2019-05-03 DIAGNOSIS — C7951 Secondary malignant neoplasm of bone: Secondary | ICD-10-CM | POA: Diagnosis not present

## 2019-05-04 ENCOUNTER — Ambulatory Visit: Payer: BC Managed Care – PPO | Admitting: Radiation Oncology

## 2019-05-05 ENCOUNTER — Other Ambulatory Visit: Payer: Self-pay

## 2019-05-05 ENCOUNTER — Encounter: Payer: Self-pay | Admitting: Radiation Oncology

## 2019-05-05 ENCOUNTER — Ambulatory Visit
Admission: RE | Admit: 2019-05-05 | Discharge: 2019-05-05 | Disposition: A | Discharge status: Home / Self Care | Payer: BC Managed Care – PPO | Source: Ambulatory Visit | Attending: Radiation Oncology | Admitting: Radiation Oncology

## 2019-05-05 ENCOUNTER — Ambulatory Visit: Payer: BC Managed Care – PPO | Admitting: Radiation Oncology

## 2019-05-05 DIAGNOSIS — C7951 Secondary malignant neoplasm of bone: Secondary | ICD-10-CM | POA: Diagnosis not present

## 2019-05-06 ENCOUNTER — Ambulatory Visit: Payer: BC Managed Care – PPO | Admitting: Radiation Oncology

## 2019-05-12 NOTE — Progress Notes (Signed)
[place]  [phone] [phone]   ID: Erin [name] DOB: 1960/10/24 MR#: [id number] CSN#: [id number]     Patient Care Team: [name], MD as PCP - General (Internal Medicine) Athea Haley, [name], MD as Consulting Physician (Oncology) Rolm Bookbinder, MD as Consulting Physician (General Surgery) Princess Bruins, MD as Consulting Physician (Obstetrics and Gynecology) Hilarie Fredrickson, Lajuan Lines, MD as Consulting Physician (Gastroenterology) Arloa Koh, MD (Inactive) as Consulting Physician (Radiation Oncology) Bonner Puna, MD as Referring Physician (Radiation Oncology) Star Age, MD as Attending Physician (Neurology) Augustina Mood, DDS as Referring Physician (Dentistry) Lyndal Pulley, DO as Consulting Physician (Family Medicine) Jari Pigg, MD as Consulting Physician (Dermatology) OTHER MD: Netty Starring  CHIEF COMPLAINT: Recurrent breast cancer (s/p bilateral mastectomies)  CURRENT THERAPY: anastrozole; denosumab/Xgeva  INTERVAL HISTORY: Erin [name] returns today for follow-up of her recurrent breast cancer.   She continues on anastrozole, with good tolerance.  Since her last visit, she underwent SBRT treatment under Dr. Isidore Moos from 04/26/2019 through 05/05/2019 to the right ischial tuberosity lesion.   REVIEW OF SYSTEMS: Erin [name] did well with radiation.  She did initially have some fatigue which cleared and the discomfort she was having in the upper right hamstring/base of the right gluteus.  For a while she was limping a little.  That problem has steadily gotten better so now she walks normally and has no discomfort that area unless she is doing some stretches mainly knee flexion and extreme abduction.  She is receiving allergy shots and wonders how that might interfere with the COVID-19 vaccine.  She had questions regarding the upcoming Xgeva treatments.  A detailed review of systems today was otherwise stable  BREAST CANCER HISTORY From  the original intake note 04/07/2011:  Erin [name] had routine screening mammography December of 2009 at the [facility] in [place], [place], showing an increasing asymmetry in her left breast. She was recalled for additional views December 29. She was noted to have heterogeneously dense breasts. It was a 5 cm area of architectural distortion in the lateral aspect of the left breast with no associated calcifications. There was a second lesion medial to this. Ultrasound showed a highly suspicious hypoechoic irregularly marginated mass measuring 3 cm at the 2:30 position 5 cm from the nipple. This was palpable to the mammographer. The second area in question I measured 7 mm and a third lesion was noted measuring 5 mm. Some left axillary lymph nodes were morphologically normal.  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 Bilateral breast MRIs were performed 05/04/2008 and showed, in the left breast, a large lobulated mass measuring up to 4.3 cm, and including both of the apparently separate masses the previously biopsied. In the right breast there were 2 indeterminate lesions. These were evaluated further with breast specific gamma imaging performed January 14 in both lesions were negative. The lesion in the left breast was markedly abnormal.  Given this complex history and with the background of significant breast density, the patient opted for bilateral mastectomies with left sentinel lymph node sampling. This was performed of 06/27/2008 and showed([id number]. [id number]) in the right breast, no malignancy. In the left breast there was a 3.2 cm invasive ductal carcinoma, grade 1, focally extending to the anterior margin of the lower outer quadrant. There was extensive angiolymphatic invasion, but both sentinel lymph nodes on the left were  negative.  The patient  received left-sided postmastectomy radiation including of the left chest wall and left supraclavicular fossa to a total dose of 50.4 Gy plus a 10 Gy scar boost. She had an Oncotype DX recurrence score of 17, further discussed below. She decided to forego reconstruction. She was tested for BRCA1 and 2 and was found to be negative. Given her overall prognosis, she did not receive chemotherapy, but started tamoxifen February of 2010, with good tolerance.  RECURRENT DISEASE:  SUMMARY OF PRESENTATION  Erin [name] took tamoxifen for 5 years with no evidence of active disease. In May 2015 we decided to stop the tamoxifen because of problems with endometrial hyperplasia. 6 months later, at the November visit, she was found to have a change in a small area of scaring her left inframammary fold. She was referred to dermatology and biopsy 04/21/2014 showed ([id number]) recurrent ductal adenocarcinoma (positive for gross cystic disease fluid protein, estrogen, and progesterone). She then underwent a staging PET scan in [facility] 05/06/2014. This showed a 1.9 cm sclerotic lesion in the right ischial tuberosity, with a maximum standard uptake value of 4.6. A sclerotic lesion in the left side of the L3 vertebral body measuring 1.6 cm had no hypermetabolic activity.  Her subsequent history is as detailed below.   PAST MEDICAL HISTORY: Past Medical History:  Diagnosis Date  . Allergic rhinitis due to pollen   . Breast cancer ([place]) 2010  . Breast cancer ([place]) 2016   reoccurrence in scar  . Hypertension   . Hypothyroidism   . Osteopenia   . S/P radiation therapy 07/12/2014 through 08/16/2014    Left chest wall 4000 cGy in 20 sessions, left chest wall boost 1000 cGy in 5 sessions  . Wears glasses   Diverticulosis     PAST SURGICAL HISTORY: Past Surgical History:  Procedure Laterality Date  . BREAST LUMPECTOMY Left 06/01/2014   Procedure: WIDE  LOCAL EXCISION OF RECURRENT LEFT BREAST CANCER;  Surgeon: Rolm Bookbinder, MD;  Location: [facility];  Service: General;  Laterality: Left;  . COLONOSCOPY    . cyst removed     left lower jaw - at age 26 yr  . DILATATION & CURRETTAGE/HYSTEROSCOPY WITH RESECTOCOPE N/A 09/16/2013   Procedure: DILATATION & CURETTAGE/HYSTEROSCOPY WITH RESECTOCOPE;  Surgeon: Princess Bruins, MD;  Location: [facility];  Service: Gynecology;  Laterality: N/A;  1 hr.  . INGUINAL HERNIA REPAIR     right side as infant  . LAPAROSCOPIC PELVIC LYMPH NODE BIOPSY    . MASTECTOMY  2010   bilateral mastectomies-lt snbx-radiation post  . MASTECTOMY Bilateral   . OOPHORECTOMY  2016   patient denies  . Right knee arthroscopy  2008  . ROBOTIC ASSISTED BILATERAL SALPINGO OOPHERECTOMY Bilateral 03/13/2015   Procedure: ROBOTIC ASSISTED BILATERAL SALPINGO OOPHORECTOMY With Washings;  Surgeon: Princess Bruins, MD;  Location: [facility];  Service: Gynecology;  Laterality: Bilateral;  . TONSILLECTOMY    . WISDOM TOOTH EXTRACTION      FAMILY HISTORY Family History  Problem Relation Age of Onset  . Hypertension Mother   . Cancer Mother        Lung Cancer - long history of tobacco use  . Hypertension Father   . Stroke Maternal Grandfather   . Stroke Paternal Grandfather   . Hypertension Brother   . Colon cancer Neg Hx   . Stomach cancer Neg Hx   The patient's mother died in 9417 from complications of lung cancer. The patient has not been in  touch with her father her for approximately 30 years. She had no sisters, 1 brother, who is in good health. There is no breast or ovarian cancer in the family to her knowledge.   GYNECOLOGIC HISTORY:  GX P0, menarche at around age 52, the patient continued to have periods despite being on tamoxifen, although more irregularly. She is now postmenopausal (status post BSO).   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. She lives alone and has no pets. Her work involves  quite a bit of travel.     ADVANCED DIRECTIVES: not in place   HEALTH MAINTENANCE:     Social History   Tobacco Use  . Smoking status: Never Smoker  . Smokeless tobacco: Never Used  Substance Use Topics  . Alcohol use: No    Alcohol/week: 0.0 standard drinks    Comment: rare  . Drug use: No      Colonoscopy: December 2013 Erin [name])  PAP: Feb 2011  Bone density: 06/2009, normal  Cholesterol: "good"    Allergies  Allergen Reactions  . Pollen Extract   . Adhesive [Tape] Dermatitis and Rash    Paper tape is better to use    MEDICATIONS:   Current Outpatient Medications  Medication Sig Dispense Refill  . acetaminophen (TYLENOL) 500 MG tablet Take 500 mg by mouth every 6 (six) hours as needed for moderate pain or headache.    Marland Kitchen acyclovir ointment (ZOVIRAX) 5 % Apply 1 application topically every 3 (three) hours. As needed 5 g 5  . amoxicillin-clavulanate (AUGMENTIN) 875-125 MG tablet Take 1 tablet by mouth 2 (two) times daily. 20 tablet 0  . anastrozole (ARIMIDEX) 1 MG tablet Take 1 tablet (1 mg total) by mouth daily. 90 tablet 4  . Cholecalciferol (VITAMIN D3) 2000 UNITS capsule Take 2,000 Units by mouth daily.      . fexofenadine (ALLEGRA) 180 MG tablet Take 180 mg by mouth daily as needed for allergies or rhinitis.    . fluocinolone (SYNALAR) 0.01 % external solution Apply 1 application topically 2 (two) times daily as needed (irritation).     Marland Kitchen ibuprofen (ADVIL,MOTRIN) 200 MG tablet Take 200 mg by mouth every 6 (six) hours as needed for moderate pain.    Marland Kitchen ketoconazole (NIZORAL) 2 % shampoo Apply 1 application topically 2 (two) times a week.    . levothyroxine (SYNTHROID) 75 MCG tablet TAKE 1 TABLET(75 MCG) BY MOUTH DAILY BEFORE BREAKFAST 90 tablet 0  . losartan (COZAAR) 100 MG tablet TAKE 1 TABLET(100 MG) BY MOUTH DAILY 90 tablet 0  . Olopatadine HCl (PAZEO) 0.7 % SOLN Place 1 drop into both eyes daily as needed (eye allergies).     . Olopatadine HCl 0.6 % SOLN Place 2 sprays  into both nostrils 2 (two) times daily as needed (allergies).     Vladimir Faster Glycol-Propyl Glycol (SYSTANE OP) Place 1 drop into both eyes daily as needed (for allergies).     . sodium chloride (OCEAN) 0.65 % SOLN nasal spray Place 1 spray into both nostrils as needed for congestion.    . Turmeric 500 MG CAPS Take 500 mg by mouth daily.      No current facility-administered medications for this visit.   Facility-Administered Medications Ordered in Other Visits  Medication Dose Route Frequency Provider Last Rate Last Admin  . denosumab (PROLIA) injection 60 mg  60 mg Subcutaneous Once Rachella Basden, [name], MD      . denosumab (XGEVA) injection 120 mg  120 mg Subcutaneous Once Broxton Broady,  [name], MD        OBJECTIVE:  Middle-aged white woman who appears younger than stated age    52:   05/13/19 1518  BP: 130/70  Pulse: 87  Resp: 18  Temp: (!) 97 F (36.1 C)  SpO2: 100%     Body mass index is 27.81 kg/m.   ECOG FS: 1 - Symptomatic but completely ambulatory  Sclerae unicteric, EOMs intact Wearing a mask No cervical or supraclavicular adenopathy Lungs no rales or rhonchi Heart regular rate and rhythm Abd soft, nontender, positive bowel sounds MSK no focal spinal tenderness, no upper extremity lymphedema Neuro: nonfocal, well oriented, appropriate affect Breasts: Status post bilateral mastectomies.  There is no evidence of local recurrence.  Both axillae are benign.  LABS:           Chemistry      Component Value Date/Time   NA 139 05/13/2019 1451   NA 139 02/28/2017 1521   K 4.6 05/13/2019 1451   K 4.6 02/28/2017 1521   CL 102 05/13/2019 1451   CL 105 08/10/2012 0944   CO2 27 05/13/2019 1451   CO2 29 02/28/2017 1521   BUN 17 05/13/2019 1451   BUN 20.0 02/28/2017 1521   CREATININE 0.83 05/13/2019 1451   CREATININE 1.0 02/28/2017 1521      Component Value Date/Time   CALCIUM 9.7 05/13/2019 1451   CALCIUM 10.4 02/28/2017 1521   ALKPHOS 119 05/13/2019 1451    ALKPHOS 62 02/28/2017 1521   AST 16 05/13/2019 1451   AST 18 02/28/2017 1521   ALT 9 05/13/2019 1451   ALT 13 02/28/2017 1521   BILITOT 1.2 05/13/2019 1451   BILITOT 1.17 02/28/2017 1521         Lab Results  Component Value Date   WBC 5.8 05/13/2019   HGB 14.6 05/13/2019   HCT 43.1 05/13/2019   MCV 86.4 05/13/2019   PLT 243 05/13/2019   NEUTROABS 3.8 05/13/2019    STUDIES  No results found.   ASSESSMENT: 59 y.o.  BRCA negative [place] woman ,  (1) status post bilateral mastectomies January 2010 for a left-sided upper outer quadrant T2 N0 (stage IIA) invasive ductal carcinoma, grade 1, strongly estrogen and progesterone receptor positive, HER-2 nonamplified,   (2) Oncotype DX recurrence score of 17, predicting a 10-12% distant recurrence risk over 10 years if her only adjuvant treatment is tamoxifen for 5 years  (3) status post Left postmastectomy radiation including Left supraclavicular fossa (5040 cGy in 28 sessions)  (4) on tamoxifen starting February of 2010, stopped May 2015 because of endometrial hyperplasia  (5) osteopenia-- T score -1.2 on repeated bone density scans 09/24/2011 and 12/12/2013  (a) bone density 01/30/2018 showed a T score of +0.1   RECURRENT DISEASE (6) skin biopsy of a lesion under the left breast scar 04/21/2014 shows adenocarcinoma, gross cystic disease fluid protein, estrogen and progesterone receptor positive  (a) s/p wide excision with negative though close margins 06/01/2014, prognostic panel pending  (b) radiation to Left chest wall 4000 cGy in 20 sessions, left schest wall boost 1000 cGy in 5 session 07/12/2014 through 08/16/2014  (7) PET scan 05/06/2014 and bone scan show only one additional area of concern, in the right ischiial tuberosity--  (a) biopsy of the right ischial tuberosity 05/24/2014 showed no evidence of malignancy  (b) zolendronate started 06/06/2014, poorly tolerated  (c) denosumab/ Xgeva started 09/26/2014,  switched to Prolia as of May 2017  (d) repeat bone scan 12/20/2014 shows no new/additional  areas of disease  (e) restaging studies 07/07/2015 do not support a diagnosis of metastatic disease.  (f) MRI of the pelvis 04/13/2016 and 03/19/2018 shows the lesion in the right ischial tuberosity to be unchanged  (g) pelvic MRI 02/12/2019 shows slight growth and possible increased activity of the ischial lesion  (h) PET scan 02/22/2019 shows no other areas of uptake.  The right ischial lesion area has possibly slightly progressed as compared to prior  (i) repeat biopsy of the right ischial lesion shows very minute clusters of atypical epithelioid cells highlighted by cytokeratin AE1/AE3 and 8/18, GA TA 3+, with no gross cystic disease fluid protein positivity, and estrogen and progesterone receptor negative.  (8) goserelin monthly started 05/09/2016, last dose 02/20/2015  (a) s/p BSO 03/13/2015 with benign pathology  (9) anastrozole started  08/29/2014  (a) bone density 12/13/2015 shows improvement, with the lumbar spine T score now at 0.1, the femoral neck -1.1, both increased  (b) continues on denosumab/Prolia most recent dose 09/05/2017  (c) repeat bone density 01/30/2018 shows a T score of 0.1  (d) denosumab/Prolia discontinued after 09/05/2017 dose  (10) status post  SBRT treatment /07/2019 through 05/05/2019 to the right ischial tuberosity lesion.  (11) denosumab/Xgeva resumed 05/13/2019     PLAN: Erin [name] tolerated the radiation to her one site of disease without any unusual complications.  The question is where to go from.  Findings and more prior question is what was in her right ischial tuberosity.  Since the cells were estrogen and progesterone receptor negative no gross cystic disease fluid protein negative, and states this is the only site of involvement, it is possible that this really has nothing to do with her breast cancer.  That actually is my belief.    Alternatively this could be a  very unusual a metastatic deposit from her breast cancer with no other deposits anywhere else after many years and with a completely different phenotype.  Either way at that particular site should have been sterilized.  We are going to take another look at it with a bone scan in June and and repeat MRI in December.  Likely we will obtain a PET scan to completely restage her June 2022 after which possibly we will also follow-up clinically only.  We discussed what to do regarding the bone lesion.  We are going to go with denosumab at 120 mg every 3 months for the first year, every 4 months for the second year, and twice a year for the next year.  After that possibly we will discontinue denosumab.  Erin [name] had other questions related to the Covid vaccines and to her allergy shots and those were addressed.  She will see me in 6 months after the bone scan and in a year after her pelvic MRI  She knows to call for any other issue that may develop before the next visit total time this visit 40 minutes.*    Jubal Rademaker, [name], MD  05/13/19 4:29 PM Medical Oncology and Hematology [facility] [place], [place] [address] Tel. [phone]    Fax. [phone]    I, Wilburn Mylar, am acting as scribe for Dr. [name]. Monea Pesantez.  I, Lurline Del MD, have reviewed the above documentation for accuracy and completeness, and I agree with the above.   *Total Encounter Time as defined by the Centers for Medicare and Medicaid Services includes, in addition to the face-to-face time of a patient visit (documented in the note above) non-face-to-face time:  obtaining and reviewing outside history, ordering and reviewing medications, tests or procedures, care coordination (communications with other health care professionals or caregivers) and documentation in the medical record.

## 2019-05-13 ENCOUNTER — Inpatient Hospital Stay: Payer: BC Managed Care – PPO

## 2019-05-13 ENCOUNTER — Inpatient Hospital Stay (HOSPITAL_BASED_OUTPATIENT_CLINIC_OR_DEPARTMENT_OTHER): Payer: BC Managed Care – PPO | Admitting: Oncology

## 2019-05-13 ENCOUNTER — Other Ambulatory Visit: Payer: Self-pay

## 2019-05-13 VITALS — BP 130/70 | HR 87 | Temp 97.0°F | Resp 18 | Ht 64.25 in | Wt 163.3 lb

## 2019-05-13 DIAGNOSIS — N8500 Endometrial hyperplasia, unspecified: Secondary | ICD-10-CM

## 2019-05-13 DIAGNOSIS — C7951 Secondary malignant neoplasm of bone: Secondary | ICD-10-CM

## 2019-05-13 DIAGNOSIS — C50912 Malignant neoplasm of unspecified site of left female breast: Secondary | ICD-10-CM

## 2019-05-13 DIAGNOSIS — M8580 Other specified disorders of bone density and structure, unspecified site: Secondary | ICD-10-CM | POA: Diagnosis not present

## 2019-05-13 DIAGNOSIS — Z7189 Other specified counseling: Secondary | ICD-10-CM

## 2019-05-13 DIAGNOSIS — C50412 Malignant neoplasm of upper-outer quadrant of left female breast: Secondary | ICD-10-CM | POA: Diagnosis not present

## 2019-05-13 DIAGNOSIS — Z170 Estrogen receptor positive status [ER+]: Secondary | ICD-10-CM

## 2019-05-13 LAB — VITAMIN D 25 HYDROXY (VIT D DEFICIENCY, FRACTURES): Vit D, 25-Hydroxy: 38.15 ng/mL (ref 30–100)

## 2019-05-13 LAB — CMP (CANCER CENTER ONLY)
ALT: 9 U/L (ref 0–44)
AST: 16 U/L (ref 15–41)
Albumin: 4 g/dL (ref 3.5–5.0)
Alkaline Phosphatase: 119 U/L (ref 38–126)
Anion gap: 10 (ref 5–15)
BUN: 17 mg/dL (ref 6–20)
CO2: 27 mmol/L (ref 22–32)
Calcium: 9.7 mg/dL (ref 8.9–10.3)
Chloride: 102 mmol/L (ref 98–111)
Creatinine: 0.83 mg/dL (ref 0.44–1.00)
GFR, Est AFR Am: >60 mL/min (ref >60)
GFR, Estimated: >60 mL/min (ref >60)
Glucose, Bld: 92 mg/dL (ref 70–99)
Potassium: 4.6 mmol/L (ref 3.5–5.1)
Sodium: 139 mmol/L (ref 135–145)
Total Bilirubin: 1.2 mg/dL (ref 0.3–1.2)
Total Protein: 7.1 g/dL (ref 6.5–8.1)

## 2019-05-13 LAB — CBC WITH DIFFERENTIAL (CANCER CENTER ONLY)
Abs Immature Granulocytes: 0 10*3/uL (ref 0.00–0.07)
Basophils Absolute: 0 10*3/uL (ref 0.0–0.1)
Basophils Relative: 1 %
Eosinophils Absolute: 0.1 10*3/uL (ref 0.0–0.5)
Eosinophils Relative: 1 %
HCT: 43.1 % (ref 36.0–46.0)
Hemoglobin: 14.6 g/dL (ref 12.0–15.0)
Immature Granulocytes: 0 %
Lymphocytes Relative: 23 %
Lymphs Abs: 1.3 10*3/uL (ref 0.7–4.0)
MCH: 29.3 pg (ref 26.0–34.0)
MCHC: 33.9 g/dL (ref 30.0–36.0)
MCV: 86.4 fL (ref 80.0–100.0)
Monocytes Absolute: 0.5 10*3/uL (ref 0.1–1.0)
Monocytes Relative: 8 %
Neutro Abs: 3.8 10*3/uL (ref 1.7–7.7)
Neutrophils Relative %: 67 %
Platelet Count: 243 10*3/uL (ref 150–400)
RBC: 4.99 MIL/uL (ref 3.87–5.11)
RDW: 11.9 % (ref 11.5–15.5)
WBC Count: 5.8 10*3/uL (ref 4.0–10.5)
nRBC: 0 % (ref 0.0–0.2)

## 2019-05-13 MED ORDER — DENOSUMAB 60 MG/ML ~~LOC~~ SOLN: 60.0000 mg | Freq: Once | SUBCUTANEOUS | Status: DC

## 2019-05-13 MED ORDER — DENOSUMAB 60 MG/ML ~~LOC~~ SOSY
PREFILLED_SYRINGE | SUBCUTANEOUS | Status: AC
  Filled 2019-05-13: qty 1

## 2019-05-13 MED ORDER — DENOSUMAB 60 MG/ML ~~LOC~~ SOSY: 60.0000 mg | PREFILLED_SYRINGE | Freq: Once | SUBCUTANEOUS | Status: DC

## 2019-05-13 MED ORDER — DENOSUMAB 120 MG/1.7ML ~~LOC~~ SOLN
120.0000 mg | Freq: Once | SUBCUTANEOUS | Status: AC
  Administered 2019-05-13: 120 mg via SUBCUTANEOUS

## 2019-05-13 NOTE — Patient Instructions (Signed)
Denosumab injection What is this medicine? DENOSUMAB (den oh sue mab) slows bone breakdown. Prolia is used to treat osteoporosis in women after menopause and in men, and in people who are taking corticosteroids for 6 months or more. Xgeva is used to treat a high calcium level due to cancer and to prevent bone fractures and other bone problems caused by multiple myeloma or cancer bone metastases. Xgeva is also used to treat giant cell tumor of the bone. This medicine may be used for other purposes; ask your health care provider or pharmacist if you have questions. COMMON BRAND NAME(S): Prolia, XGEVA What should I tell my health care provider before I take this medicine? They need to know if you have any of these conditions:  dental disease  having surgery or tooth extraction  infection  kidney disease  low levels of calcium or Vitamin D in the blood  malnutrition  on hemodialysis  skin conditions or sensitivity  thyroid or parathyroid disease  an unusual reaction to denosumab, other medicines, foods, dyes, or preservatives  pregnant or trying to get pregnant  breast-feeding How should I use this medicine? This medicine is for injection under the skin. It is given by a health care professional in a hospital or clinic setting. A special MedGuide will be given to you before each treatment. Be sure to read this information carefully each time. For Prolia, talk to your pediatrician regarding the use of this medicine in children. Special care may be needed. For Xgeva, talk to your pediatrician regarding the use of this medicine in children. While this drug may be prescribed for children as young as 13 years for selected conditions, precautions do apply. Overdosage: If you think you have taken too much of this medicine contact a poison control center or emergency room at once. NOTE: This medicine is only for you. Do not share this medicine with others. What if I miss a dose? It is  important not to miss your dose. Call your doctor or health care professional if you are unable to keep an appointment. What may interact with this medicine? Do not take this medicine with any of the following medications:  other medicines containing denosumab This medicine may also interact with the following medications:  medicines that lower your chance of fighting infection  steroid medicines like prednisone or cortisone This list may not describe all possible interactions. Give your health care provider a list of all the medicines, herbs, non-prescription drugs, or dietary supplements you use. Also tell them if you smoke, drink alcohol, or use illegal drugs. Some items may interact with your medicine. What should I watch for while using this medicine? Visit your doctor or health care professional for regular checks on your progress. Your doctor or health care professional may order blood tests and other tests to see how you are doing. Call your doctor or health care professional for advice if you get a fever, chills or sore throat, or other symptoms of a cold or flu. Do not treat yourself. This drug may decrease your body's ability to fight infection. Try to avoid being around people who are sick. You should make sure you get enough calcium and vitamin D while you are taking this medicine, unless your doctor tells you not to. Discuss the foods you eat and the vitamins you take with your health care professional. See your dentist regularly. Brush and floss your teeth as directed. Before you have any dental work done, tell your dentist you are   receiving this medicine. Do not become pregnant while taking this medicine or for 5 months after stopping it. Talk with your doctor or health care professional about your birth control options while taking this medicine. Women should inform their doctor if they wish to become pregnant or think they might be pregnant. There is a potential for serious side  effects to an unborn child. Talk to your health care professional or pharmacist for more information. What side effects may I notice from receiving this medicine? Side effects that you should report to your doctor or health care professional as soon as possible:  allergic reactions like skin rash, itching or hives, swelling of the face, lips, or tongue  bone pain  breathing problems  dizziness  jaw pain, especially after dental work  redness, blistering, peeling of the skin  signs and symptoms of infection like fever or chills; cough; sore throat; pain or trouble passing urine  signs of low calcium like fast heartbeat, muscle cramps or muscle pain; pain, tingling, numbness in the hands or feet; seizures  unusual bleeding or bruising  unusually weak or tired Side effects that usually do not require medical attention (report to your doctor or health care professional if they continue or are bothersome):  constipation  diarrhea  headache  joint pain  loss of appetite  muscle pain  runny nose  tiredness  upset stomach This list may not describe all possible side effects. Call your doctor for medical advice about side effects. You may report side effects to FDA at 1-800-FDA-1088. Where should I keep my medicine? This medicine is only given in a clinic, doctor's office, or other health care setting and will not be stored at home. NOTE: This sheet is a summary. It may not cover all possible information. If you have questions about this medicine, talk to your doctor, pharmacist, or health care provider.  2020 Elsevier/Gold Standard (2017-08-15 16:10:44)

## 2019-05-13 NOTE — Addendum Note (Signed)
Addended by: Chauncey Cruel on: 05/13/2019 04:48 PM   Modules accepted: Orders

## 2019-05-14 ENCOUNTER — Telehealth: Payer: Self-pay | Admitting: Oncology

## 2019-05-14 LAB — THYROID PANEL WITH TSH
Free Thyroxine Index: 2.4 (ref 1.2–4.9)
T3 Uptake Ratio: 29 % (ref 24–39)
T4, Total: 8.3 ug/dL (ref 4.5–12.0)
TSH: 2.11 u[IU]/mL (ref 0.450–4.500)

## 2019-05-14 LAB — LIPID PANEL
Cholesterol: 171 mg/dL (ref 0–200)
HDL: 55 mg/dL (ref >40)
LDL Cholesterol: 105 mg/dL — ABNORMAL HIGH (ref 0–99)
Total CHOL/HDL Ratio: 3.1 RATIO
Triglycerides: 56 mg/dL (ref <150)
VLDL: 11 mg/dL (ref 0–40)

## 2019-05-14 NOTE — Telephone Encounter (Signed)
I talk with patient regarding schedule she said she would like injections on fridays

## 2019-05-17 ENCOUNTER — Encounter: Payer: Self-pay | Admitting: Internal Medicine

## 2019-05-17 ENCOUNTER — Ambulatory Visit (INDEPENDENT_AMBULATORY_CARE_PROVIDER_SITE_OTHER): Payer: BC Managed Care – PPO | Admitting: Internal Medicine

## 2019-05-17 DIAGNOSIS — M8589 Other specified disorders of bone density and structure, multiple sites: Secondary | ICD-10-CM

## 2019-05-17 DIAGNOSIS — I10 Essential (primary) hypertension: Secondary | ICD-10-CM | POA: Diagnosis not present

## 2019-05-17 DIAGNOSIS — Z0000 Encounter for general adult medical examination without abnormal findings: Secondary | ICD-10-CM

## 2019-05-17 DIAGNOSIS — E038 Other specified hypothyroidism: Secondary | ICD-10-CM

## 2019-05-17 NOTE — Progress Notes (Signed)
Virtual Visit via Video Note  I connected with [name] on 05/17/19 at  4:00 PM EST by a video enabled telemedicine application and verified that I am speaking with the correct person using two identifiers.  The patient and the provider were at separate locations throughout the entire encounter.   I discussed the limitations of evaluation and management by telemedicine and the availability of in person appointments. The patient expressed understanding and agreed to proceed. The patient and the provider were the only parties present for the visit unless noted in HPI below.  History of Present Illness: The patient is a 59 y.o. female with visit for physical.  PMH, [place], social history reviewed and updated   Observations/Objective: Appearance: normal, breathing appears normal, casual grooming, abdomen does not appear distended, throat normal, memory normal, mental status is A and O times 3  Assessment and Plan: See problem oriented charting  Follow Up Instructions: ordered bone density for the fall, labs in 1 year  I discussed the assessment and treatment plan with the patient. The patient was provided an opportunity to ask questions and all were answered. The patient agreed with the plan and demonstrated an understanding of the instructions.   The patient was advised to call back or seek an in-person evaluation if the symptoms worsen or if the condition fails to improve as anticipated.  [name], MD

## 2019-05-18 DIAGNOSIS — Z0000 Encounter for general adult medical examination without abnormal findings: Secondary | ICD-10-CM | POA: Insufficient documentation

## 2019-05-18 NOTE — Assessment & Plan Note (Signed)
BP at goal on losartan, recent BMP without indication for change.

## 2019-05-18 NOTE — Assessment & Plan Note (Signed)
Needs repeat bone density later this year.

## 2019-05-18 NOTE — Assessment & Plan Note (Signed)
Flu shot up to date. Shingrix complete. Tetanus up to date. Cologuard due 2023. Mammogram up to date, pap smear up to date and dexa up to date. Counseled about sun safety and mole surveillance. Counseled about the dangers of distracted driving. Given 10 year screening recommendations.

## 2019-05-18 NOTE — Assessment & Plan Note (Signed)
Most recent levels normal, continue synthroid 75 mcg daily.

## 2019-05-24 ENCOUNTER — Other Ambulatory Visit: Payer: Self-pay | Admitting: Oncology

## 2019-05-24 NOTE — Progress Notes (Signed)
  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  Cancer Staging: Cancer Staging Malignant neoplasm of upper-outer quadrant of left breast in female, estrogen receptor positive ([place]) Staging form: Breast, AJCC 7th Edition - Clinical: Stage IV ([place], NX, M1) - Signed by Chauncey Cruel, MD on 07/04/2014  Recurrent cancer of left breast [facility]) Staging form: Breast, AJCC 7th Edition - Clinical: Stage IV ([place], NX, M1) - Signed by Chauncey Cruel, MD on 07/04/2014   Intent: local control  Radiation Treatment Dates: 04/26/2019 through 05/05/2019 Site Technique Total Dose (Gy) Dose per Fx (Gy) Completed Fx Beam Energies  Ischium: Pelvis_R_ Ischium IMRT 50/50 10 5/5 6XFFF   Narrative: The patient tolerated radiation therapy (SBRT given every other day) relatively well.    Plan: She will follow-up with radiation oncology on 06/09/19, or as needed. -----------------------------------  Eppie Gibson, MD

## 2019-05-24 NOTE — Progress Notes (Signed)
Radiation Treatment Dates: 04/26/2019 through 05/05/2019 Site Technique Total Dose (Gy) Dose per Fx (Gy) Completed Fx Beam Energies  Ischium: Pelvis_R_ Ischium IMRT 50/50 10 5/5 6XFFF

## 2019-06-08 ENCOUNTER — Encounter: Payer: Self-pay | Admitting: Radiation Oncology

## 2019-06-09 ENCOUNTER — Ambulatory Visit
Admission: RE | Admit: 2019-06-09 | Discharge: 2019-06-09 | Disposition: A | Discharge status: Home / Self Care | Payer: BC Managed Care – PPO | Source: Ambulatory Visit | Attending: Radiation Oncology | Admitting: Radiation Oncology

## 2019-06-09 ENCOUNTER — Encounter: Payer: Self-pay | Admitting: Radiation Oncology

## 2019-06-09 DIAGNOSIS — C7951 Secondary malignant neoplasm of bone: Secondary | ICD-10-CM

## 2019-06-09 HISTORY — DX: Personal history of irradiation: Z92.3

## 2019-06-09 NOTE — Progress Notes (Signed)
Radiation Oncology         [phone]) [phone] ________________________________  Name: Erin [name] MRN: [id number]  Date: 06/09/2019  DOB: 04-19-1961  Follow-Up Visit Note by telephone   Outpatient  CC: Erin Koch, MD  [name], [name], MD  Diagnosis and Prior Radiotherapy:    ICD-10-CM   1. Bone metastases ([place])  C79.51     04/26/2019 through 05/05/2019 Site Technique Total Dose (Gy) Dose per Fx (Gy) Completed Fx Beam Energies  Ischium: Pelvis_R_ Ischium IMRT 50/50 10 5/5 6XFFF    CHIEF COMPLAINT: Here for follow-up and surveillance of metastatic breast cancer  Narrative:  The patient returns today for routine follow-up. She last saw Dr. [name] on 05/13/2019 and resumed Xgeva at that time. She also continues on anastrozole under his care. Per Dr. [name] note, the plan is for her to undergo repeat bone scan in 09/2019 and repeat MRI in 03/2020.  She is doing well. She reports the fatigue has resolved She reports a little residual soreness and tightness in the right buttock muscle when she stretches/exercises. She had a similar sensation after the biopsy. It is improving.  ALLERGIES:  is allergic to pollen extract and adhesive [tape].  Meds: Current Outpatient Medications  Medication Sig Dispense Refill  . acetaminophen (TYLENOL) 500 MG tablet Take 500 mg by mouth every 6 (six) hours as needed for moderate pain or headache.    Marland Kitchen acyclovir ointment (ZOVIRAX) 5 % Apply 1 application topically every 3 (three) hours. As needed 5 g 5  . anastrozole (ARIMIDEX) 1 MG tablet Take 1 tablet (1 mg total) by mouth daily. 90 tablet 4  . Cholecalciferol (VITAMIN D3) 2000 UNITS capsule Take 2,000 Units by mouth daily.      . fexofenadine (ALLEGRA) 180 MG tablet Take 180 mg by mouth daily as needed for allergies or rhinitis.    . fluocinolone (SYNALAR) 0.01 % external solution Apply 1 application topically 2 (two) times daily as needed (irritation).     Marland Kitchen ibuprofen (ADVIL,MOTRIN)  200 MG tablet Take 200 mg by mouth every 6 (six) hours as needed for moderate pain.    Marland Kitchen ketoconazole (NIZORAL) 2 % shampoo Apply 1 application topically 2 (two) times a week.    . levothyroxine (SYNTHROID) 75 MCG tablet TAKE 1 TABLET(75 MCG) BY MOUTH DAILY BEFORE BREAKFAST 90 tablet 0  . losartan (COZAAR) 100 MG tablet TAKE 1 TABLET(100 MG) BY MOUTH DAILY 90 tablet 0  . Olopatadine HCl 0.6 % SOLN Place 2 sprays into both nostrils 2 (two) times daily as needed (allergies).     Vladimir Faster Glycol-Propyl Glycol (SYSTANE OP) Place 1 drop into both eyes daily as needed (for allergies).     . sodium chloride (OCEAN) 0.65 % SOLN nasal spray Place 1 spray into both nostrils as needed for congestion.    . Turmeric 500 MG CAPS Take 500 mg by mouth daily.      No current facility-administered medications for this encounter.    Physical Findings: The patient is in no acute distress. Patient is alert and oriented.  vitals were not taken for this visit. .      Lab Findings: Lab Results  Component Value Date   WBC 5.8 05/13/2019   HGB 14.6 05/13/2019   HCT 43.1 05/13/2019   MCV 86.4 05/13/2019   PLT 243 05/13/2019    Radiographic Findings: No results found.  Impression/Plan: She is feeling well approximately 1 month after SBRT to the right ischial bone.  She will  continue to follow-up with medical oncology.  I will see her back on an as needed basis.  She is pleased with this plan.  This encounter was provided by telemedicine platform telephone as patient was unable to access MyChart video during pandemic precautions The patient has given verbal consent for this type of encounter and has been advised to only accept a meeting of this type in a secure network environment. The attendants for this meeting include Eppie Gibson  and [name].  During the encounter, Eppie Gibson was located at [facility] Radiation Oncology Department.  Leamsi Pohl was located at home.  On  date of service, in total, I spent 20 minutes on this encounter  _____________________________________   Eppie Gibson, MD  This document serves as a record of services personally performed by Eppie Gibson, MD. It was created on her behalf by Wilburn Mylar, a trained medical scribe. The creation of this record is based on the scribe's personal observations and the provider's statements to them. This document has been checked and approved by the attending provider.

## 2019-06-10 ENCOUNTER — Other Ambulatory Visit: Payer: Self-pay | Admitting: Oncology

## 2019-06-10 NOTE — Progress Notes (Signed)
  04/26/2019 through 05/05/2019 Site Technique Total Dose (Gy) Dose per Fx (Gy) Completed Fx Beam Energies  Ischium: Pelvis_R_ Ischium IMRT 50/50 10 5/5 6XFFF

## 2019-06-14 ENCOUNTER — Other Ambulatory Visit: Payer: Self-pay | Admitting: Internal Medicine

## 2019-06-14 DIAGNOSIS — E038 Other specified hypothyroidism: Secondary | ICD-10-CM

## 2019-07-02 ENCOUNTER — Ambulatory Visit: Payer: BC Managed Care – PPO | Attending: Internal Medicine

## 2019-07-02 DIAGNOSIS — Z23 Encounter for immunization: Secondary | ICD-10-CM

## 2019-07-02 NOTE — Progress Notes (Signed)
   Covid-19 Vaccination Clinic  Name:  Erin [name]    MRN: [id number] DOB: Dec 01, 1960  07/02/2019  Ms. Erin [name] was observed post Covid-19 immunization for 15 minutes without incident. She was provided with Vaccine Information Sheet and instruction [name] access the V-Safe system.   Erin [name] was instructed [name] call 911 with any severe reactions post vaccine: Marland Kitchen Difficulty breathing  . Swelling of face and throat  . A fast heartbeat  . A bad rash all over body  . Dizziness and weakness   Immunizations Administered    Name Date Dose VIS Date Route   Pfizer COVID-19 Vaccine 07/02/2019  4:22 PM 0.3 mL 04/02/2019 Intramuscular   Manufacturer: [employer]   Lot: [id number]   [place]: [id number]

## 2019-07-27 ENCOUNTER — Ambulatory Visit: Payer: BC Managed Care – PPO | Attending: Internal Medicine

## 2019-07-27 DIAGNOSIS — Z23 Encounter for immunization: Secondary | ICD-10-CM

## 2019-07-27 NOTE — Progress Notes (Signed)
   Covid-19 Vaccination Clinic  Name:  Karli Westberry    MRN: [id number] DOB: 1960/12/04  07/27/2019  Ms. Haner was observed post Covid-19 immunization for 15 minutes without incident. She was provided with Vaccine Information Sheet and instruction to access the V-Safe system.   Ms. Paolella was instructed to call 911 with any severe reactions post vaccine: Marland Kitchen Difficulty breathing  . Swelling of face and throat  . A fast heartbeat  . A bad rash all over body  . Dizziness and weakness   Immunizations Administered    Name Date Dose VIS Date Route   Pfizer COVID-19 Vaccine 07/27/2019  4:26 PM 0.3 mL 04/02/2019 Intramuscular   Manufacturer: [place]   Lot: [id number]   [place]: [id number]

## 2019-08-05 ENCOUNTER — Other Ambulatory Visit: Payer: BC Managed Care – PPO

## 2019-08-05 ENCOUNTER — Ambulatory Visit: Payer: BC Managed Care – PPO

## 2019-08-06 ENCOUNTER — Inpatient Hospital Stay: Payer: BC Managed Care – PPO

## 2019-08-06 ENCOUNTER — Other Ambulatory Visit: Payer: Self-pay

## 2019-08-06 ENCOUNTER — Inpatient Hospital Stay: Payer: BC Managed Care – PPO | Attending: Oncology

## 2019-08-06 VITALS — BP 153/79 | HR 70 | Temp 98.3°F | Resp 18

## 2019-08-06 DIAGNOSIS — C50412 Malignant neoplasm of upper-outer quadrant of left female breast: Secondary | ICD-10-CM

## 2019-08-06 DIAGNOSIS — Z170 Estrogen receptor positive status [ER+]: Secondary | ICD-10-CM | POA: Diagnosis not present

## 2019-08-06 DIAGNOSIS — N8500 Endometrial hyperplasia, unspecified: Secondary | ICD-10-CM

## 2019-08-06 DIAGNOSIS — M8580 Other specified disorders of bone density and structure, unspecified site: Secondary | ICD-10-CM | POA: Diagnosis present

## 2019-08-06 DIAGNOSIS — C50912 Malignant neoplasm of unspecified site of left female breast: Secondary | ICD-10-CM

## 2019-08-06 LAB — CMP (CANCER CENTER ONLY)
ALT: 13 U/L (ref 0–44)
AST: 19 U/L (ref 15–41)
Albumin: 4 g/dL (ref 3.5–5.0)
Alkaline Phosphatase: 84 U/L (ref 38–126)
Anion gap: 10 (ref 5–15)
BUN: 18 mg/dL (ref 6–20)
CO2: 24 mmol/L (ref 22–32)
Calcium: 9.5 mg/dL (ref 8.9–10.3)
Chloride: 103 mmol/L (ref 98–111)
Creatinine: 0.82 mg/dL (ref 0.44–1.00)
GFR, Est AFR Am: >60 mL/min (ref >60)
GFR, Estimated: >60 mL/min (ref >60)
Glucose, Bld: 109 mg/dL — ABNORMAL HIGH (ref 70–99)
Potassium: 4.5 mmol/L (ref 3.5–5.1)
Sodium: 137 mmol/L (ref 135–145)
Total Bilirubin: 1.1 mg/dL (ref 0.3–1.2)
Total Protein: 7.3 g/dL (ref 6.5–8.1)

## 2019-08-06 LAB — CBC WITH DIFFERENTIAL (CANCER CENTER ONLY)
Abs Immature Granulocytes: 0.02 10*3/uL (ref 0.00–0.07)
Basophils Absolute: 0.1 10*3/uL (ref 0.0–0.1)
Basophils Relative: 1 %
Eosinophils Absolute: 0.1 10*3/uL (ref 0.0–0.5)
Eosinophils Relative: 2 %
HCT: 44.8 % (ref 36.0–46.0)
Hemoglobin: 15 g/dL (ref 12.0–15.0)
Immature Granulocytes: 0 %
Lymphocytes Relative: 24 %
Lymphs Abs: 1.6 10*3/uL (ref 0.7–4.0)
MCH: 29.2 pg (ref 26.0–34.0)
MCHC: 33.5 g/dL (ref 30.0–36.0)
MCV: 87.3 fL (ref 80.0–100.0)
Monocytes Absolute: 0.5 10*3/uL (ref 0.1–1.0)
Monocytes Relative: 7 %
Neutro Abs: 4.6 10*3/uL (ref 1.7–7.7)
Neutrophils Relative %: 66 %
Platelet Count: 223 10*3/uL (ref 150–400)
RBC: 5.13 MIL/uL — ABNORMAL HIGH (ref 3.87–5.11)
RDW: 12.2 % (ref 11.5–15.5)
WBC Count: 6.9 10*3/uL (ref 4.0–10.5)
nRBC: 0 % (ref 0.0–0.2)

## 2019-08-06 MED ORDER — DENOSUMAB 120 MG/1.7ML ~~LOC~~ SOLN
SUBCUTANEOUS | Status: AC
  Filled 2019-08-06: qty 1.7

## 2019-08-06 MED ORDER — DENOSUMAB 120 MG/1.7ML ~~LOC~~ SOLN
120.0000 mg | Freq: Once | SUBCUTANEOUS | Status: AC
  Administered 2019-08-06: 120 mg via SUBCUTANEOUS

## 2019-08-06 NOTE — Patient Instructions (Signed)
Denosumab injection What is this medicine? DENOSUMAB (den oh sue mab) slows bone breakdown. Prolia is used to treat osteoporosis in women after menopause and in men, and in people who are taking corticosteroids for 6 months or more. Xgeva is used to treat a high calcium level due to cancer and to prevent bone fractures and other bone problems caused by multiple myeloma or cancer bone metastases. Xgeva is also used to treat giant cell tumor of the bone. This medicine may be used for other purposes; ask your health care provider or pharmacist if you have questions. COMMON BRAND NAME(S): Prolia, XGEVA What should I tell my health care provider before I take this medicine? They need to know if you have any of these conditions:  dental disease  having surgery or tooth extraction  infection  kidney disease  low levels of calcium or Vitamin D in the blood  malnutrition  on hemodialysis  skin conditions or sensitivity  thyroid or parathyroid disease  an unusual reaction to denosumab, other medicines, foods, dyes, or preservatives  pregnant or trying to get pregnant  breast-feeding How should I use this medicine? This medicine is for injection under the skin. It is given by a health care professional in a hospital or clinic setting. A special MedGuide will be given to you before each treatment. Be sure to read this information carefully each time. For Prolia, talk to your pediatrician regarding the use of this medicine in children. Special care may be needed. For Xgeva, talk to your pediatrician regarding the use of this medicine in children. While this drug may be prescribed for children as young as 13 years for selected conditions, precautions do apply. Overdosage: If you think you have taken too much of this medicine contact a poison control center or emergency room at once. NOTE: This medicine is only for you. Do not share this medicine with others. What if I miss a dose? It is  important not to miss your dose. Call your doctor or health care professional if you are unable to keep an appointment. What may interact with this medicine? Do not take this medicine with any of the following medications:  other medicines containing denosumab This medicine may also interact with the following medications:  medicines that lower your chance of fighting infection  steroid medicines like prednisone or cortisone This list may not describe all possible interactions. Give your health care provider a list of all the medicines, herbs, non-prescription drugs, or dietary supplements you use. Also tell them if you smoke, drink alcohol, or use illegal drugs. Some items may interact with your medicine. What should I watch for while using this medicine? Visit your doctor or health care professional for regular checks on your progress. Your doctor or health care professional may order blood tests and other tests to see how you are doing. Call your doctor or health care professional for advice if you get a fever, chills or sore throat, or other symptoms of a cold or flu. Do not treat yourself. This drug may decrease your body's ability to fight infection. Try to avoid being around people who are sick. You should make sure you get enough calcium and vitamin D while you are taking this medicine, unless your doctor tells you not to. Discuss the foods you eat and the vitamins you take with your health care professional. See your dentist regularly. Brush and floss your teeth as directed. Before you have any dental work done, tell your dentist you are   receiving this medicine. Do not become pregnant while taking this medicine or for 5 months after stopping it. Talk with your doctor or health care professional about your birth control options while taking this medicine. Women should inform their doctor if they wish to become pregnant or think they might be pregnant. There is a potential for serious side  effects to an unborn child. Talk to your health care professional or pharmacist for more information. What side effects may I notice from receiving this medicine? Side effects that you should report to your doctor or health care professional as soon as possible:  allergic reactions like skin rash, itching or hives, swelling of the face, lips, or tongue  bone pain  breathing problems  dizziness  jaw pain, especially after dental work  redness, blistering, peeling of the skin  signs and symptoms of infection like fever or chills; cough; sore throat; pain or trouble passing urine  signs of low calcium like fast heartbeat, muscle cramps or muscle pain; pain, tingling, numbness in the hands or feet; seizures  unusual bleeding or bruising  unusually weak or tired Side effects that usually do not require medical attention (report to your doctor or health care professional if they continue or are bothersome):  constipation  diarrhea  headache  joint pain  loss of appetite  muscle pain  runny nose  tiredness  upset stomach This list may not describe all possible side effects. Call your doctor for medical advice about side effects. You may report side effects to FDA at 1-800-FDA-1088. Where should I keep my medicine? This medicine is only given in a clinic, doctor's office, or other health care setting and will not be stored at home. NOTE: This sheet is a summary. It may not cover all possible information. If you have questions about this medicine, talk to your doctor, pharmacist, or health care provider.  2020 Elsevier/Gold Standard (2017-08-15 16:10:44)

## 2019-09-23 ENCOUNTER — Other Ambulatory Visit: Payer: Self-pay | Admitting: *Deleted

## 2019-09-28 ENCOUNTER — Telehealth: Payer: Self-pay

## 2019-09-28 NOTE — Telephone Encounter (Signed)
Pt called to inquire about appt for bone scan per Dr [name], stating she needs to make appt before her visit with him 10/28/19.   This nurse called [facility] to setup scan for 10/20/19 at 10am with a 1pm return. LVM for pt informing her of her appt at [facility] and that she needs to arrive at [place] for her appt. Advised pt to return call to [phone] with any questions regarding this appt.

## 2019-10-14 ENCOUNTER — Encounter: Payer: Self-pay | Admitting: Internal Medicine

## 2019-10-14 ENCOUNTER — Other Ambulatory Visit (INDEPENDENT_AMBULATORY_CARE_PROVIDER_SITE_OTHER): Payer: BC Managed Care – PPO

## 2019-10-14 ENCOUNTER — Telehealth (INDEPENDENT_AMBULATORY_CARE_PROVIDER_SITE_OTHER): Payer: BC Managed Care – PPO | Admitting: Internal Medicine

## 2019-10-14 DIAGNOSIS — R197 Diarrhea, unspecified: Secondary | ICD-10-CM

## 2019-10-14 DIAGNOSIS — R109 Unspecified abdominal pain: Secondary | ICD-10-CM

## 2019-10-14 LAB — CBC WITH DIFFERENTIAL/PLATELET
Basophils Absolute: 0.1 10*3/uL (ref 0.0–0.1)
Basophils Relative: 1 % (ref 0.0–3.0)
Eosinophils Absolute: 0 10*3/uL (ref 0.0–0.7)
Eosinophils Relative: 0.7 % (ref 0.0–5.0)
HCT: 42.4 % (ref 36.0–46.0)
Hemoglobin: 14.4 g/dL (ref 12.0–15.0)
Lymphocytes Relative: 21.6 % (ref 12.0–46.0)
Lymphs Abs: 1.2 10*3/uL (ref 0.7–4.0)
MCHC: 34 g/dL (ref 30.0–36.0)
MCV: 86.7 fl (ref 78.0–100.0)
Monocytes Absolute: 0.4 10*3/uL (ref 0.1–1.0)
Monocytes Relative: 8 % (ref 3.0–12.0)
Neutro Abs: 3.9 10*3/uL (ref 1.4–7.7)
Neutrophils Relative %: 68.7 % (ref 43.0–77.0)
Platelets: 208 10*3/uL (ref 150.0–400.0)
RBC: 4.89 Mil/uL (ref 3.87–5.11)
RDW: 12.8 % (ref 11.5–15.5)
WBC: 5.6 10*3/uL (ref 4.0–10.5)

## 2019-10-14 NOTE — Assessment & Plan Note (Signed)
For labs as ordered,  to f/u any worsening symptoms or concerns

## 2019-10-14 NOTE — Patient Instructions (Signed)
Ok to try otc align probiotic, and immodium prn  Please continue all other medications as before, and refills have been done if requested.  Please have the pharmacy call with any other refills you may need.  Please keep your appointments with your specialists as you may have planned  Please go to the LAB at the blood drawing area for the tests to be done  You will be contacted by phone if any changes need to be made immediately.  Otherwise, you will receive a letter about your results with an explanation, but please check with MyChart first.  Please remember to sign up for MyChart if you have not done so, as this will be important to you in the future with finding out test results, communicating by private email, and scheduling acute appointments online when needed.

## 2019-10-14 NOTE — Assessment & Plan Note (Addendum)
Etiology unclear, very mild symptoms only, for labs as ordered, f/u with PCP  I spent 31 minutes in preparing to see the patient by review of recent labs, imaging and procedures, obtaining and reviewing separately obtained history, communicating with the patient and family or caregiver, ordering medications, tests or procedures, and documenting clinical information in the EHR including the differential Dx, treatment, and any further evaluation and other management of diarrhea, abd pain

## 2019-10-14 NOTE — Progress Notes (Signed)
Patient ID: Erin [name], female   DOB: 1960-09-24, 59 y.o.   MRN: [id number]  Virtual Visit via Video Note  I connected with Erin [name] on 10/14/19 at  4:00 PM EDT by a video enabled telemedicine application and verified that I am speaking with the correct person using two identifiers.  Location: of all participants today Patient: at home Provider: at office   I discussed the limitations of evaluation and management by telemedicine and the availability of in person appointments. The patient expressed understanding and agreed to proceed.  History of Present Illness: Here with c/o just over 3 wks onset GI symptoms primarily gas like with loose stool soft and frequent with mild diffuse abd pain at worst, despite trials of bland food, yogurt, BRAT diet; denies fever, n/v, BRB or melena, appettie and wt ok, no recent antibx use.  She has been exposed to an old [employer], wondering about some kind of bacterial infection.  No hx of h pylori, c diff, celieac dz or recent travel or unusual pets.  Pt denies chest pain, increased sob or doe, wheezing, orthopnea, PND, increased LE swelling, palpitations, dizziness or syncope.   Pt denies polydipsia, polyuria Past Medical History:  Diagnosis Date  . Allergic rhinitis due to pollen   . Breast cancer ([place]) 2010  . Breast cancer ([place]) 2016   reoccurrence in scar  . History of radiation therapy 04/26/19- 05/05/19   Right ischium 5 fractions of 10 Gy each to total 50 Gy.   Marland Kitchen Hypertension   . Hypothyroidism   . Osteopenia   . S/P radiation therapy 07/12/2014 through 08/16/2014    Left chest wall 4000 cGy in 20 sessions, left chest wall boost 1000 cGy in 5 sessions  . Wears glasses    Past Surgical History:  Procedure Laterality Date  . BREAST LUMPECTOMY Left 06/01/2014   Procedure: WIDE LOCAL EXCISION OF RECURRENT LEFT BREAST CANCER;  Surgeon: Rolm Bookbinder, MD;  Location: [facility];  Service: General;  Laterality: Left;  . COLONOSCOPY    . cyst removed     left lower jaw - at age 62 yr  . DILATATION & CURRETTAGE/HYSTEROSCOPY WITH RESECTOCOPE N/A 09/16/2013   Procedure: DILATATION & CURETTAGE/HYSTEROSCOPY WITH RESECTOCOPE;  Surgeon: Princess Bruins, MD;  Location: [facility];  Service: Gynecology;  Laterality: N/A;  1 hr.  . INGUINAL HERNIA REPAIR     right side as infant  . LAPAROSCOPIC PELVIC LYMPH NODE BIOPSY    . MASTECTOMY  2010   bilateral mastectomies-lt snbx-radiation post  . MASTECTOMY Bilateral   . OOPHORECTOMY  2016   patient denies  . Right knee arthroscopy  2008  . ROBOTIC ASSISTED BILATERAL SALPINGO OOPHERECTOMY Bilateral 03/13/2015   Procedure: ROBOTIC ASSISTED BILATERAL SALPINGO OOPHORECTOMY With Washings;  Surgeon: Princess Bruins, MD;  Location: [facility];  Service: Gynecology;  Laterality: Bilateral;  . TONSILLECTOMY    . WISDOM TOOTH EXTRACTION      reports that she has never smoked. She has never used smokeless tobacco. She reports that she does not drink alcohol and does not use drugs. family history includes Cancer in her mother; Hypertension in her brother, father, and mother; Stroke in her maternal grandfather and paternal grandfather. Allergies  Allergen Reactions  . Pollen Extract   . Adhesive [Tape] Dermatitis and Rash    Paper tape is better to use   Current Outpatient Medications on File Prior to Visit  Medication Sig Dispense Refill  . acetaminophen (TYLENOL) 500 MG tablet Take  500 mg by mouth every 6 (six) hours as needed for moderate pain or headache.    Marland Kitchen acyclovir ointment (ZOVIRAX) 5 % Apply 1 application topically every 3 (three) hours. As needed 5 g 5  . anastrozole (ARIMIDEX) 1 MG tablet Take 1 tablet (1 mg total) by mouth daily. 90 tablet 4  . Cholecalciferol (VITAMIN D3) 2000 UNITS capsule Take 2,000 Units by mouth daily.      . fexofenadine (ALLEGRA) 180 MG tablet Take 180 mg by mouth daily as needed for allergies or  rhinitis.    . fluocinolone (SYNALAR) 0.01 % external solution Apply 1 application topically 2 (two) times daily as needed (irritation).     Marland Kitchen ibuprofen (ADVIL,MOTRIN) 200 MG tablet Take 200 mg by mouth every 6 (six) hours as needed for moderate pain.    Marland Kitchen ketoconazole (NIZORAL) 2 % shampoo Apply 1 application topically 2 (two) times a week.    . levothyroxine (SYNTHROID) 75 MCG tablet TAKE 1 TABLET(75 MCG) BY MOUTH DAILY BEFORE BREAKFAST 90 tablet 3  . losartan (COZAAR) 100 MG tablet TAKE 1 TABLET(100 MG) BY MOUTH DAILY 90 tablet 3  . Olopatadine HCl 0.6 % SOLN Place 2 sprays into both nostrils 2 (two) times daily as needed (allergies).     Vladimir Faster Glycol-Propyl Glycol (SYSTANE OP) Place 1 drop into both eyes daily as needed (for allergies).     . sodium chloride (OCEAN) 0.65 % SOLN nasal spray Place 1 spray into both nostrils as needed for congestion.    . Turmeric 500 MG CAPS Take 500 mg by mouth daily.      No current facility-administered medications on file prior to visit.    Observations/Objective: Alert, NAD, appropriate mood and affect, resps normal, cn 2-12 intact, moves all 4s, no visible rash or swelling Past Medical History:  Diagnosis Date  . Allergic rhinitis due to pollen   . Breast cancer ([place]) 2010  . Breast cancer ([place]) 2016   reoccurrence in scar  . History of radiation therapy 04/26/19- 05/05/19   Right ischium 5 fractions of 10 Gy each to total 50 Gy.   Marland Kitchen Hypertension   . Hypothyroidism   . Osteopenia   . S/P radiation therapy 07/12/2014 through 08/16/2014    Left chest wall 4000 cGy in 20 sessions, left chest wall boost 1000 cGy in 5 sessions  . Wears glasses    Past Surgical History:  Procedure Laterality Date  . BREAST LUMPECTOMY Left 06/01/2014   Procedure: WIDE LOCAL EXCISION OF RECURRENT LEFT BREAST CANCER;  Surgeon: Rolm Bookbinder, MD;  Location: [place];  Service: General;   Laterality: Left;  . COLONOSCOPY    . cyst removed     left lower jaw - at age 63 yr  . DILATATION & CURRETTAGE/HYSTEROSCOPY WITH RESECTOCOPE N/A 09/16/2013   Procedure: DILATATION & CURETTAGE/HYSTEROSCOPY WITH RESECTOCOPE;  Surgeon: Princess Bruins, MD;  Location: [facility];  Service: Gynecology;  Laterality: N/A;  1 hr.  . INGUINAL HERNIA REPAIR     right side as infant  . LAPAROSCOPIC PELVIC LYMPH NODE BIOPSY    . MASTECTOMY  2010   bilateral mastectomies-lt snbx-radiation post  . MASTECTOMY Bilateral   . OOPHORECTOMY  2016   patient denies  . Right knee arthroscopy  2008  . ROBOTIC ASSISTED BILATERAL SALPINGO OOPHERECTOMY Bilateral 03/13/2015   Procedure: ROBOTIC ASSISTED BILATERAL SALPINGO OOPHORECTOMY With Washings;  Surgeon: Princess Bruins, MD;  Location: [facility];  Service: Gynecology;  Laterality: Bilateral;  . TONSILLECTOMY    .  WISDOM TOOTH EXTRACTION      reports that she has never smoked. She has never used smokeless tobacco. She reports that she does not drink alcohol and does not use drugs. family history includes Cancer in her mother; Hypertension in her brother, father, and mother; Stroke in her maternal grandfather and paternal grandfather. Allergies  Allergen Reactions  . Pollen Extract   . Adhesive [Tape] Dermatitis and Rash    Paper tape is better to use   Current Outpatient Medications on File Prior to Visit  Medication Sig Dispense Refill  . acetaminophen (TYLENOL) 500 MG tablet Take 500 mg by mouth every 6 (six) hours as needed for moderate pain or headache.    Marland Kitchen acyclovir ointment (ZOVIRAX) 5 % Apply 1 application topically every 3 (three) hours. As needed 5 g 5  . anastrozole (ARIMIDEX) 1 MG tablet Take 1 tablet (1 mg total) by mouth daily. 90 tablet 4  . Cholecalciferol (VITAMIN D3) 2000 UNITS capsule Take 2,000 Units by mouth daily.      . fexofenadine (ALLEGRA) 180 MG tablet Take 180 mg by mouth daily as needed for allergies or rhinitis.    . fluocinolone  (SYNALAR) 0.01 % external solution Apply 1 application topically 2 (two) times daily as needed (irritation).     Marland Kitchen ibuprofen (ADVIL,MOTRIN) 200 MG tablet Take 200 mg by mouth every 6 (six) hours as needed for moderate pain.    Marland Kitchen ketoconazole (NIZORAL) 2 % shampoo Apply 1 application topically 2 (two) times a week.    . levothyroxine (SYNTHROID) 75 MCG tablet TAKE 1 TABLET(75 MCG) BY MOUTH DAILY BEFORE BREAKFAST 90 tablet 3  . losartan (COZAAR) 100 MG tablet TAKE 1 TABLET(100 MG) BY MOUTH DAILY 90 tablet 3  . Olopatadine HCl 0.6 % SOLN Place 2 sprays into both nostrils 2 (two) times daily as needed (allergies).     Vladimir Faster Glycol-Propyl Glycol (SYSTANE OP) Place 1 drop into both eyes daily as needed (for allergies).     . sodium chloride (OCEAN) 0.65 % SOLN nasal spray Place 1 spray into both nostrils as needed for congestion.    . Turmeric 500 MG CAPS Take 500 mg by mouth daily.      No current facility-administered medications on file prior to visit.   Assessment and Plan: See notes  Follow Up Instructions: See notes   I discussed the assessment and treatment plan with the patient. The patient was provided an opportunity to ask questions and all were answered. The patient agreed with the plan and demonstrated an understanding of the instructions.   The patient was advised to call back or seek an in-person evaluation if the symptoms worsen or if the condition fails to improve as anticipated.   Cathlean Cower, MD

## 2019-10-15 LAB — HEPATIC FUNCTION PANEL
ALT: 10 U/L (ref 0–35)
AST: 17 U/L (ref 0–37)
Albumin: 4.4 g/dL (ref 3.5–5.2)
Alkaline Phosphatase: 54 U/L (ref 39–117)
Bilirubin, Direct: 0.1 mg/dL (ref 0.0–0.3)
Total Bilirubin: 0.9 mg/dL (ref 0.2–1.2)
Total Protein: 7 g/dL (ref 6.0–8.3)

## 2019-10-15 LAB — BASIC METABOLIC PANEL
BUN: 13 mg/dL (ref 6–23)
CO2: 19 mEq/L (ref 19–32)
Calcium: 9.3 mg/dL (ref 8.4–10.5)
Chloride: 105 mEq/L (ref 96–112)
Creatinine, Ser: 0.87 mg/dL (ref 0.40–1.20)
GFR: 66.9 mL/min (ref >60.00)
Glucose, Bld: 83 mg/dL (ref 70–99)
Potassium: 4.4 mEq/L (ref 3.5–5.1)
Sodium: 139 mEq/L (ref 135–145)

## 2019-10-15 LAB — GLIADIN ANTIBODIES, SERUM
Gliadin IgA: 5 Units
Gliadin IgG: 7 Units

## 2019-10-15 LAB — LIPASE: Lipase: 12 U/L (ref 11.0–59.0)

## 2019-10-20 ENCOUNTER — Encounter (HOSPITAL_COMMUNITY): Payer: BC Managed Care – PPO

## 2019-10-21 ENCOUNTER — Encounter (HOSPITAL_COMMUNITY)
Admission: RE | Admit: 2019-10-21 | Discharge: 2019-10-21 | Disposition: A | Discharge status: Home / Self Care | Payer: BC Managed Care – PPO | Source: Ambulatory Visit | Attending: Oncology | Admitting: Oncology

## 2019-10-21 ENCOUNTER — Other Ambulatory Visit: Payer: Self-pay

## 2019-10-21 ENCOUNTER — Other Ambulatory Visit: Payer: BC Managed Care – PPO

## 2019-10-21 DIAGNOSIS — Z170 Estrogen receptor positive status [ER+]: Secondary | ICD-10-CM | POA: Insufficient documentation

## 2019-10-21 DIAGNOSIS — C50412 Malignant neoplasm of upper-outer quadrant of left female breast: Secondary | ICD-10-CM | POA: Insufficient documentation

## 2019-10-21 DIAGNOSIS — C7951 Secondary malignant neoplasm of bone: Secondary | ICD-10-CM | POA: Diagnosis present

## 2019-10-21 DIAGNOSIS — Z7189 Other specified counseling: Secondary | ICD-10-CM | POA: Insufficient documentation

## 2019-10-21 DIAGNOSIS — R197 Diarrhea, unspecified: Secondary | ICD-10-CM

## 2019-10-21 DIAGNOSIS — C50912 Malignant neoplasm of unspecified site of left female breast: Secondary | ICD-10-CM

## 2019-10-21 DIAGNOSIS — M8580 Other specified disorders of bone density and structure, unspecified site: Secondary | ICD-10-CM

## 2019-10-21 DIAGNOSIS — R109 Unspecified abdominal pain: Secondary | ICD-10-CM

## 2019-10-21 MED ORDER — TECHNETIUM TC 99M MEDRONATE IV KIT
20.0000 | PACK | Freq: Once | INTRAVENOUS | Status: AC | PRN
  Administered 2019-10-21: 19.3 via INTRAVENOUS

## 2019-10-22 ENCOUNTER — Other Ambulatory Visit: Payer: Self-pay | Admitting: Internal Medicine

## 2019-10-22 LAB — CLOSTRIDIUM DIFFICILE BY PCR: Toxigenic C. Difficile by PCR: POSITIVE — AB

## 2019-10-22 MED ORDER — VANCOMYCIN HCL 125 MG PO CAPS: 125.0000 mg | ORAL_CAPSULE | Freq: Four times a day (QID) | ORAL | 0 refills | Status: DC

## 2019-10-25 ENCOUNTER — Other Ambulatory Visit: Payer: Self-pay | Admitting: Oncology

## 2019-10-25 ENCOUNTER — Encounter: Payer: Self-pay | Admitting: Oncology

## 2019-10-25 LAB — STOOL CULTURE
MICRO NUMBER:: 10657845
MICRO NUMBER:: 10657846
MICRO NUMBER:: 10657847
SHIGA RESULT:: NOT DETECTED
SPECIMEN QUALITY:: ADEQUATE
SPECIMEN QUALITY:: ADEQUATE
SPECIMEN QUALITY:: ADEQUATE

## 2019-10-26 ENCOUNTER — Other Ambulatory Visit (INDEPENDENT_AMBULATORY_CARE_PROVIDER_SITE_OTHER): Payer: BC Managed Care – PPO

## 2019-10-26 DIAGNOSIS — R197 Diarrhea, unspecified: Secondary | ICD-10-CM | POA: Diagnosis not present

## 2019-10-26 DIAGNOSIS — R109 Unspecified abdominal pain: Secondary | ICD-10-CM

## 2019-10-26 LAB — URINALYSIS, ROUTINE W REFLEX MICROSCOPIC
Bilirubin Urine: NEGATIVE
Hgb urine dipstick: NEGATIVE
Ketones, ur: NEGATIVE
Leukocytes,Ua: NEGATIVE
Nitrite: NEGATIVE
RBC / HPF: NONE SEEN (ref 0–?)
Specific Gravity, Urine: <=1.005 — AB (ref 1.000–1.030)
Total Protein, Urine: NEGATIVE
Urine Glucose: NEGATIVE
Urobilinogen, UA: 0.2 (ref 0.0–1.0)
WBC, UA: NONE SEEN (ref 0–?)
pH: 6.5 (ref 5.0–8.0)

## 2019-10-28 ENCOUNTER — Inpatient Hospital Stay (HOSPITAL_BASED_OUTPATIENT_CLINIC_OR_DEPARTMENT_OTHER): Payer: BC Managed Care – PPO | Admitting: Oncology

## 2019-10-28 ENCOUNTER — Other Ambulatory Visit: Payer: Self-pay

## 2019-10-28 ENCOUNTER — Inpatient Hospital Stay: Payer: BC Managed Care – PPO | Attending: Oncology

## 2019-10-28 ENCOUNTER — Inpatient Hospital Stay: Payer: BC Managed Care – PPO

## 2019-10-28 ENCOUNTER — Telehealth: Payer: Self-pay | Admitting: Oncology

## 2019-10-28 VITALS — BP 135/75 | HR 79 | Temp 98.7°F | Resp 20 | Ht 64.25 in | Wt 160.1 lb

## 2019-10-28 DIAGNOSIS — C7951 Secondary malignant neoplasm of bone: Secondary | ICD-10-CM | POA: Diagnosis not present

## 2019-10-28 DIAGNOSIS — Z170 Estrogen receptor positive status [ER+]: Secondary | ICD-10-CM

## 2019-10-28 DIAGNOSIS — M8580 Other specified disorders of bone density and structure, unspecified site: Secondary | ICD-10-CM | POA: Diagnosis not present

## 2019-10-28 DIAGNOSIS — C50912 Malignant neoplasm of unspecified site of left female breast: Secondary | ICD-10-CM | POA: Diagnosis not present

## 2019-10-28 DIAGNOSIS — C50412 Malignant neoplasm of upper-outer quadrant of left female breast: Secondary | ICD-10-CM | POA: Diagnosis present

## 2019-10-28 DIAGNOSIS — N8500 Endometrial hyperplasia, unspecified: Secondary | ICD-10-CM

## 2019-10-28 LAB — CBC WITH DIFFERENTIAL (CANCER CENTER ONLY)
Abs Immature Granulocytes: 0.01 10*3/uL (ref 0.00–0.07)
Basophils Absolute: 0 10*3/uL (ref 0.0–0.1)
Basophils Relative: 1 %
Eosinophils Absolute: 0.1 10*3/uL (ref 0.0–0.5)
Eosinophils Relative: 1 %
HCT: 43.4 % (ref 36.0–46.0)
Hemoglobin: 14.6 g/dL (ref 12.0–15.0)
Immature Granulocytes: 0 %
Lymphocytes Relative: 19 %
Lymphs Abs: 1 10*3/uL (ref 0.7–4.0)
MCH: 29.7 pg (ref 26.0–34.0)
MCHC: 33.6 g/dL (ref 30.0–36.0)
MCV: 88.2 fL (ref 80.0–100.0)
Monocytes Absolute: 0.4 10*3/uL (ref 0.1–1.0)
Monocytes Relative: 7 %
Neutro Abs: 4 10*3/uL (ref 1.7–7.7)
Neutrophils Relative %: 72 %
Platelet Count: 205 10*3/uL (ref 150–400)
RBC: 4.92 MIL/uL (ref 3.87–5.11)
RDW: 12.2 % (ref 11.5–15.5)
WBC Count: 5.6 10*3/uL (ref 4.0–10.5)
nRBC: 0 % (ref 0.0–0.2)

## 2019-10-28 LAB — CMP (CANCER CENTER ONLY)
ALT: 13 U/L (ref 0–44)
AST: 16 U/L (ref 15–41)
Albumin: 3.9 g/dL (ref 3.5–5.0)
Alkaline Phosphatase: 69 U/L (ref 38–126)
Anion gap: 9 (ref 5–15)
BUN: 16 mg/dL (ref 6–20)
CO2: 26 mmol/L (ref 22–32)
Calcium: 9.3 mg/dL (ref 8.9–10.3)
Chloride: 104 mmol/L (ref 98–111)
Creatinine: 1 mg/dL (ref 0.44–1.00)
GFR, Est AFR Am: >60 mL/min (ref >60)
GFR, Estimated: >60 mL/min (ref >60)
Glucose, Bld: 96 mg/dL (ref 70–99)
Potassium: 4.5 mmol/L (ref 3.5–5.1)
Sodium: 139 mmol/L (ref 135–145)
Total Bilirubin: 1.1 mg/dL (ref 0.3–1.2)
Total Protein: 6.9 g/dL (ref 6.5–8.1)

## 2019-10-28 MED ORDER — DENOSUMAB 120 MG/1.7ML ~~LOC~~ SOLN
SUBCUTANEOUS | Status: AC
  Filled 2019-10-28: qty 1.7

## 2019-10-28 MED ORDER — DENOSUMAB 120 MG/1.7ML ~~LOC~~ SOLN
120.0000 mg | Freq: Once | SUBCUTANEOUS | Status: AC
  Administered 2019-10-28: 120 mg via SUBCUTANEOUS

## 2019-10-28 NOTE — Patient Instructions (Signed)
Denosumab injection What is this medicine? DENOSUMAB (den oh sue mab) slows bone breakdown. Prolia is used to treat osteoporosis in women after menopause and in men, and in people who are taking corticosteroids for 6 months or more. Xgeva is used to treat a high calcium level due to cancer and to prevent bone fractures and other bone problems caused by multiple myeloma or cancer bone metastases. Xgeva is also used to treat giant cell tumor of the bone. This medicine may be used for other purposes; ask your health care provider or pharmacist if you have questions. COMMON BRAND NAME(S): Prolia, XGEVA What should I tell my health care provider before I take this medicine? They need to know if you have any of these conditions:  dental disease  having surgery or tooth extraction  infection  kidney disease  low levels of calcium or Vitamin D in the blood  malnutrition  on hemodialysis  skin conditions or sensitivity  thyroid or parathyroid disease  an unusual reaction to denosumab, other medicines, foods, dyes, or preservatives  pregnant or trying to get pregnant  breast-feeding How should I use this medicine? This medicine is for injection under the skin. It is given by a health care professional in a hospital or clinic setting. A special MedGuide will be given to you before each treatment. Be sure to read this information carefully each time. For Prolia, talk to your pediatrician regarding the use of this medicine in children. Special care may be needed. For Xgeva, talk to your pediatrician regarding the use of this medicine in children. While this drug may be prescribed for children as young as 13 years for selected conditions, precautions do apply. Overdosage: If you think you have taken too much of this medicine contact a poison control center or emergency room at once. NOTE: This medicine is only for you. Do not share this medicine with others. What if I miss a dose? It is  important not to miss your dose. Call your doctor or health care professional if you are unable to keep an appointment. What may interact with this medicine? Do not take this medicine with any of the following medications:  other medicines containing denosumab This medicine may also interact with the following medications:  medicines that lower your chance of fighting infection  steroid medicines like prednisone or cortisone This list may not describe all possible interactions. Give your health care provider a list of all the medicines, herbs, non-prescription drugs, or dietary supplements you use. Also tell them if you smoke, drink alcohol, or use illegal drugs. Some items may interact with your medicine. What should I watch for while using this medicine? Visit your doctor or health care professional for regular checks on your progress. Your doctor or health care professional may order blood tests and other tests to see how you are doing. Call your doctor or health care professional for advice if you get a fever, chills or sore throat, or other symptoms of a cold or flu. Do not treat yourself. This drug may decrease your body's ability to fight infection. Try to avoid being around people who are sick. You should make sure you get enough calcium and vitamin D while you are taking this medicine, unless your doctor tells you not to. Discuss the foods you eat and the vitamins you take with your health care professional. See your dentist regularly. Brush and floss your teeth as directed. Before you have any dental work done, tell your dentist you are   receiving this medicine. Do not become pregnant while taking this medicine or for 5 months after stopping it. Talk with your doctor or health care professional about your birth control options while taking this medicine. Women should inform their doctor if they wish to become pregnant or think they might be pregnant. There is a potential for serious side  effects to an unborn child. Talk to your health care professional or pharmacist for more information. What side effects may I notice from receiving this medicine? Side effects that you should report to your doctor or health care professional as soon as possible:  allergic reactions like skin rash, itching or hives, swelling of the face, lips, or tongue  bone pain  breathing problems  dizziness  jaw pain, especially after dental work  redness, blistering, peeling of the skin  signs and symptoms of infection like fever or chills; cough; sore throat; pain or trouble passing urine  signs of low calcium like fast heartbeat, muscle cramps or muscle pain; pain, tingling, numbness in the hands or feet; seizures  unusual bleeding or bruising  unusually weak or tired Side effects that usually do not require medical attention (report to your doctor or health care professional if they continue or are bothersome):  constipation  diarrhea  headache  joint pain  loss of appetite  muscle pain  runny nose  tiredness  upset stomach This list may not describe all possible side effects. Call your doctor for medical advice about side effects. You may report side effects to FDA at 1-800-FDA-1088. Where should I keep my medicine? This medicine is only given in a clinic, doctor's office, or other health care setting and will not be stored at home. NOTE: This sheet is a summary. It may not cover all possible information. If you have questions about this medicine, talk to your doctor, pharmacist, or health care provider.  2020 Elsevier/Gold Standard (2017-08-15 16:10:44)

## 2019-10-28 NOTE — Telephone Encounter (Signed)
Scheduled appts per 7/8 los. Gave pt a print out of AVS.  °

## 2019-10-28 NOTE — Progress Notes (Signed)
[place]  Telephone:([phone] Fax:([phone]) [phone]   ID: [name] DOB: 1960/05/24 MR#: [id number] CSN#: [id number]     Patient Care Team: [name], MD as PCP - General (Internal Medicine) Aldonia Keeven, [name], MD as Consulting Physician (Oncology) Rolm Bookbinder, MD as Consulting Physician (General Surgery) Princess Bruins, MD as Consulting Physician (Obstetrics and Gynecology) Hilarie Fredrickson, Lajuan Lines, MD as Consulting Physician (Gastroenterology) Star Age, MD as Attending Physician (Neurology) Augustina Mood, DDS as Referring Physician (Dentistry) Lyndal Pulley, DO as Consulting Physician (Family Medicine) Jari Pigg, MD as Consulting Physician (Dermatology) Eppie Gibson, MD as Attending Physician (Radiation Oncology) OTHER MD: Netty Starring  CHIEF COMPLAINT: Recurrent breast cancer (s/p bilateral mastectomies)  CURRENT THERAPY: anastrozole; denosumab/Xgeva   INTERVAL HISTORY: [name] returns today for follow-up of her recurrent breast cancer.   She continues on anastrozole, with good tolerance.  Hot flashes and vaginal dryness are not major concerns.  Her most recent bone density screening on 01/30/2018 showed a T-score of -1.1, which is considered osteopenic. She is scheduled for repeat screening on 02/11/2020.  Since her last visit, she underwent bone scan on 10/21/2019 showing: no evidence of bony metastases; degenerative change in lumbar spine at L2-3.  The site of prior concern in her sacral region is not active.   REVIEW OF SYSTEMS: [name] had an episode of C. difficile diarrhea which was treated by Dr.John with oral vancomycin.  She is currently on day 6 of treatment and already is showing significant improvement.  She has had her Pfizer vaccine x2 with no complications.  She feels her lower left rib cage appears a little bit more prominent than the right and this is a concern to her.  After her radiation treatments and really after the  very first radiation treatment she developed pain in the right buttock.  This is intermittent but has not completely disappeared.  She is exercising regularly, doing a lot of walking and using the elliptical as well.  She does weights sometimes.  A detailed review of systems today was otherwise stable   BREAST CANCER HISTORY From the original intake note 04/07/2011:  Ms. Silverthorne had routine screening mammography December of 2009 at the [facility] in [place], [place], showing an increasing asymmetry in her left breast. She was recalled for additional views December 29. She was noted to have heterogeneously dense breasts. It was a 5 cm area of architectural distortion in the lateral aspect of the left breast with no associated calcifications. There was a second lesion medial to this. Ultrasound showed a highly suspicious hypoechoic irregularly marginated mass measuring 3 cm at the 2:30 position 5 cm from the nipple. This was palpable to the mammographer. The second area in question I measured 7 mm and a third lesion was noted measuring 5 mm. Some left axillary lymph nodes were morphologically normal.  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 Bilateral breast MRIs were performed 05/04/2008 and showed, in the left breast, a large lobulated mass measuring up to 4.3 cm, and including both of the apparently separate masses the previously biopsied. In the right breast there were 2 indeterminate lesions. These were evaluated further with breast specific gamma imaging performed January 14 in both lesions were negative. The lesion in the left breast was markedly abnormal.  Given this complex history and with the background of significant  breast density, the patient opted for bilateral mastectomies with left sentinel lymph node sampling. This was  performed of 06/27/2008 and showed([id number]. [id number]) in the right breast, no malignancy. In the left breast there was a 3.2 cm invasive ductal carcinoma, grade 1, focally extending to the anterior margin of the lower outer quadrant. There was extensive angiolymphatic invasion, but both sentinel lymph nodes on the left were negative.  The patient received left-sided postmastectomy radiation including of the left chest wall and left supraclavicular fossa to a total dose of 50.4 Gy plus a 10 Gy scar boost. She had an Oncotype DX recurrence score of 17, further discussed below. She decided to forego reconstruction. She was tested for BRCA1 and 2 and was found to be negative. Given her overall prognosis, she did not receive chemotherapy, but started tamoxifen February of 2010, with good tolerance.  RECURRENT DISEASE:  SUMMARY OF PRESENTATION  [name] took tamoxifen for 5 years with no evidence of active disease. In May 2015 we decided to stop the tamoxifen because of problems with endometrial hyperplasia. 6 months later, at the November visit, she was found to have a change in a small area of scaring her left inframammary fold. She was referred to dermatology and biopsy 04/21/2014 showed ([id number]) recurrent ductal adenocarcinoma (positive for gross cystic disease fluid protein, estrogen, and progesterone). She then underwent a staging PET scan in [facility] 05/06/2014. This showed a 1.9 cm sclerotic lesion in the right ischial tuberosity, with a maximum standard uptake value of 4.6. A sclerotic lesion in the left side of the L3 vertebral body measuring 1.6 cm had no hypermetabolic activity.  Her subsequent history is as detailed below.   PAST MEDICAL HISTORY: Past Medical History:  Diagnosis Date  . Allergic rhinitis due to pollen   . Breast cancer ([facility]) 2010  . Breast cancer ([place]) 2016   reoccurrence in scar  . History of radiation therapy 04/26/19- 05/05/19   Right ischium 5 fractions of 10 Gy  each to total 50 Gy.   Marland Kitchen Hypertension   . Hypothyroidism   . Osteopenia   . S/P radiation therapy 07/12/2014 through 08/16/2014    Left chest wall 4000 cGy in 20 sessions, left chest wall boost 1000 cGy in 5 sessions  . Wears glasses   Diverticulosis     PAST SURGICAL HISTORY: Past Surgical History:  Procedure Laterality Date  . BREAST LUMPECTOMY Left 06/01/2014   Procedure: WIDE LOCAL EXCISION OF RECURRENT LEFT BREAST CANCER;  Surgeon: Rolm Bookbinder, MD;  Location: [facility];  Service: General;  Laterality: Left;  . COLONOSCOPY    . cyst removed     left lower jaw - at age 51 yr  . DILATATION & CURRETTAGE/HYSTEROSCOPY WITH RESECTOCOPE N/A 09/16/2013   Procedure: DILATATION & CURETTAGE/HYSTEROSCOPY WITH RESECTOCOPE;  Surgeon: Princess Bruins, MD;  Location: [facility];  Service: Gynecology;  Laterality: N/A;  1 hr.  . INGUINAL HERNIA REPAIR     right side as infant  . LAPAROSCOPIC PELVIC LYMPH NODE BIOPSY    . MASTECTOMY  2010   bilateral mastectomies-lt snbx-radiation post  . MASTECTOMY Bilateral   . OOPHORECTOMY  2016   patient denies  . Right knee arthroscopy  2008  . ROBOTIC ASSISTED BILATERAL SALPINGO OOPHERECTOMY Bilateral 03/13/2015   Procedure: ROBOTIC ASSISTED BILATERAL SALPINGO OOPHORECTOMY With Washings;  Surgeon: Princess Bruins, MD;  Location: [facility];  Service: Gynecology;  Laterality: Bilateral;  . TONSILLECTOMY    . WISDOM TOOTH EXTRACTION  FAMILY HISTORY Family History  Problem Relation Age of Onset  . Hypertension Mother   . Cancer Mother        Lung Cancer - long history of tobacco use  . Hypertension Father   . Stroke Maternal Grandfather   . Stroke Paternal Grandfather   . Hypertension Brother   . Colon cancer Neg Hx   . Stomach cancer Neg Hx   The patient's mother died in 8841 from complications of lung cancer. The patient has not been in touch with her father her for  approximately 30 years. She had no sisters, 1 brother, who is in good health. There is no breast or ovarian cancer in the family to her knowledge.   GYNECOLOGIC HISTORY:  GX P0, menarche at around age 52, the patient continued to have periods despite being on tamoxifen, although more irregularly. She is now postmenopausal (status post BSO).   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. She lives alone and has no pets. Her work involves quite a bit of travel.     ADVANCED DIRECTIVES: not in place   HEALTH MAINTENANCE:     Social History   Tobacco Use  . Smoking status: Never Smoker  . Smokeless tobacco: Never Used  Vaping Use  . Vaping Use: Never used  Substance Use Topics  . Alcohol use: No    Alcohol/week: 0.0 standard drinks    Comment: rare  . Drug use: No      Colonoscopy: December 2013 Coll[facility] Mares)  PAP: Feb 2011  Bone density: 06/2009, normal  Cholesterol: "good"    Allergies  Allergen Reactions  . Pollen Extract   . Adhesive [Tape] Dermatitis and Rash    Paper tape is better to use    MEDICATIONS:   Current Outpatient Medications  Medication Sig Dispense Refill  . acetaminophen (TYLENOL) 500 MG tablet Take 500 mg by mouth every 6 (six) hours as needed for moderate pain or headache.    Marland Kitchen acyclovir ointment (ZOVIRAX) 5 % Apply 1 application topically every 3 (three) hours. As needed 5 g 5  . anastrozole (ARIMIDEX) 1 MG tablet Take 1 tablet (1 mg total) by mouth daily. 90 tablet 4  . Cholecalciferol (VITAMIN D3) 2000 UNITS capsule Take 2,000 Units by mouth daily.      . fexofenadine (ALLEGRA) 180 MG tablet Take 180 mg by mouth daily as needed for allergies or rhinitis.    . fluocinolone (SYNALAR) 0.01 % external solution Apply 1 application topically 2 (two) times daily as needed (irritation).     Marland Kitchen ibuprofen (ADVIL,MOTRIN) 200 MG tablet Take 200 mg by mouth every 6 (six) hours as needed for moderate pain.    Marland Kitchen ketoconazole (NIZORAL) 2 % shampoo Apply 1  application topically 2 (two) times a week.    . levothyroxine (SYNTHROID) 75 MCG tablet TAKE 1 TABLET(75 MCG) BY MOUTH DAILY BEFORE BREAKFAST 90 tablet 3  . losartan (COZAAR) 100 MG tablet TAKE 1 TABLET(100 MG) BY MOUTH DAILY 90 tablet 3  . Olopatadine HCl 0.6 % SOLN Place 2 sprays into both nostrils 2 (two) times daily as needed (allergies).     Vladimir Faster Glycol-Propyl Glycol (SYSTANE OP) Place 1 drop into both eyes daily as needed (for allergies).     . sodium chloride (OCEAN) 0.65 % SOLN nasal spray Place 1 spray into both nostrils as needed for congestion.    . Turmeric 500 MG CAPS Take 500 mg by mouth daily.     . vancomycin (VANCOCIN)  125 MG capsule Take 1 capsule (125 mg total) by mouth 4 (four) times daily. 40 capsule 0   No current facility-administered medications for this visit.    OBJECTIVE:  white woman who appears younger than stated age    59:   10/28/19 1334  BP: 135/75  Pulse: 79  Resp: 20  Temp: 98.7 F (37.1 C)  SpO2: 99%     Body mass index is 27.27 kg/m.   ECOG FS: 1 - Symptomatic but completely ambulatory  Sclerae unicteric, EOMs intact Wearing a mask No cervical or supraclavicular adenopathy Lungs no rales or rhonchi Heart regular rate and rhythm Abd soft, nontender, positive bowel sounds MSK no focal spinal tenderness, no upper extremity lymphedema; there is really no significant difference in the lower rib cage right as compared to left.  I photographed this I gave her a copy of the photo so she could see it in in a frontal way and not simply from above as she of course has to otherwise Neuro: nonfocal, well oriented, appropriate affect Breasts: Status post bilateral mastectomies.  I do not palpate any unusual chest wall findings or axillary findings   LABS:           Chemistry      Component Value Date/Time   NA 139 10/28/2019 1318   NA 139 02/28/2017 1521   K 4.5 10/28/2019 1318   K 4.6 02/28/2017 1521   CL 104 10/28/2019 1318   CL  105 08/10/2012 0944   CO2 26 10/28/2019 1318   CO2 29 02/28/2017 1521   BUN 16 10/28/2019 1318   BUN 20.0 02/28/2017 1521   CREATININE 1.00 10/28/2019 1318   CREATININE 1.0 02/28/2017 1521      Component Value Date/Time   CALCIUM 9.3 10/28/2019 1318   CALCIUM 10.4 02/28/2017 1521   ALKPHOS 69 10/28/2019 1318   ALKPHOS 62 02/28/2017 1521   AST 16 10/28/2019 1318   AST 18 02/28/2017 1521   ALT 13 10/28/2019 1318   ALT 13 02/28/2017 1521   BILITOT 1.1 10/28/2019 1318   BILITOT 1.17 02/28/2017 1521         Lab Results  Component Value Date   WBC 5.6 10/28/2019   HGB 14.6 10/28/2019   HCT 43.4 10/28/2019   MCV 88.2 10/28/2019   PLT 205 10/28/2019   NEUTROABS 4.0 10/28/2019    STUDIES  NM Bone Scan Whole Body  Result Date: 10/22/2019 CLINICAL DATA:  Breast cancer staging. EXAM: NUCLEAR MEDICINE WHOLE BODY BONE SCAN TECHNIQUE: Whole body anterior and posterior images were obtained approximately 3 hours after intravenous injection of radiopharmaceutical. RADIOPHARMACEUTICALS:  19.3 mCi Technetium-79mMDP IV COMPARISON:  12/20/2014 FINDINGS: Adequate uptake of radiotracer is noted. Physiologic renal activity is demonstrated with some activity in the urinary bladder. Small focus of uptake at approximately the L2-3 level biased towards the LEFT, within the spine. No additional focal area of radiotracer activity. Degenerative changes correspond L2-3 activity on the recent PET. This degenerative change was not present in 2016. IMPRESSION: 1. Focus of activity within the lumbar spine related to L2-3 degenerative change which has developed since the prior PET and nuclear medicine evaluation. 2. No scintigraphic evidence of bony metastases. Electronically Signed   By: GZetta BillsM.D.   On: 10/22/2019 09:39     ASSESSMENT: 59y.o.  BRCA negative [place] woman ,  (1) status post bilateral mastectomies January 2010 for a left-sided upper outer quadrant T2 N0 (stage IIA) invasive ductal  carcinoma, grade  1, strongly estrogen and progesterone receptor positive, HER-2 nonamplified,   (2) Oncotype DX recurrence score of 17, predicting a 10-12% distant recurrence risk over 10 years if her only adjuvant treatment is tamoxifen for 5 years  (3) status post Left postmastectomy radiation including Left supraclavicular fossa (5040 cGy in 28 sessions)  (4) on tamoxifen starting February of 2010, stopped May 2015 because of endometrial hyperplasia  (5) osteopenia-- T score -1.2 on repeated bone density scans 09/24/2011 and 12/12/2013  (a) bone density 01/30/2018 showed a T score of +0.1   RECURRENT DISEASE (6) skin biopsy of a lesion under the left breast scar 04/21/2014 shows adenocarcinoma, gross cystic disease fluid protein, estrogen and progesterone receptor positive  (a) s/p wide excision with negative though close margins 06/01/2014, prognostic panel pending  (b) radiation to Left chest wall 4000 cGy in 20 sessions, left schest wall boost 1000 cGy in 5 session 07/12/2014 through 08/16/2014  (7) PET scan 05/06/2014 and bone scan show only one additional area of concern, in the right ischiial tuberosity--  (a) biopsy of the right ischial tuberosity 05/24/2014 showed no evidence of malignancy  (b) zolendronate started 06/06/2014, poorly tolerated  (c) denosumab/ Xgeva started 09/26/2014, switched to Prolia as of May 2017  (d) repeat bone scan 12/20/2014 shows no new/additional areas of disease  (e) restaging studies 07/07/2015 do not support a diagnosis of metastatic disease.  (f) MRI of the pelvis 04/13/2016 and 03/19/2018 shows the lesion in the right ischial tuberosity to be unchanged  (g) pelvic MRI 02/12/2019 shows slight growth and possible increased activity of the ischial lesion  (h) PET scan 02/22/2019 shows no other areas of uptake.  The right ischial lesion area has possibly slightly progressed as compared to prior  (i) repeat biopsy of the right ischial lesion  shows very minute clusters of atypical epithelioid cells highlighted by cytokeratin AE1/AE3 and 8/18, GA TA 3+, with no gross cystic disease fluid protein positivity, and estrogen and progesterone receptor negative.  (8) goserelin monthly started 05/09/2016, last dose 02/20/2015  (a) s/p BSO 03/13/2015 with benign pathology  (9) anastrozole started  08/29/2014  (a) bone density 12/13/2015 shows improvement, with the lumbar spine T score now at 0.1, the femoral neck -1.1, both increased  (b) continues on denosumab/Prolia most recent dose 09/05/2017  (c) repeat bone density 01/30/2018 shows a T score of 0.1  (d) denosumab/Prolia discontinued after 09/05/2017 dose  (10) status post  SBRT treatment /07/2019 through 05/05/2019 to the right ischial tuberosity lesion  (a) no uptake at the area in question on bone scan 10/21/2019  (11) denosumab/Xgeva resumed 05/13/2019, repeated every 12 weeks    PLAN: We discussed the results of her bone scan at length.  In the area in question in the sacral region there is simply no uptake.  This is very favorable.  She does have what appears to be degenerative change at L2-L3.  This was noted before on the PET scan and prior filming and we will follow it up with an MRI of the lumbar spine in October.  This appears to be benign by all accounts.  I reassured her that I really do not see a significant difference between her right and left lower rib cage areas.  I gave her a photo so she can see it very clearly from a frontal direction.    We discussed C. difficile issues and she is on the appropriate treatment already having improvement.  She understands this may recur and if her symptoms  do worsen she will let us know immediately.  I commended her exercise program.  I do not think she has L'hermitte's sign in her left buttock from radiation.  I think this may be simply another form of arthritis or possibly related to her earlier bone marrow biopsies.  This is not a  cancer.  She is going to return to see me with her Delton See dose late October and she will have had her lumbar MRI prior to that visit  Total encounter time 40 minutes.*  Britainy Kozub, [name], MD  10/29/19 9:34 AM Medical Oncology and Hematology [facility] [place],  [address] Tel. [phone]    Fax. [phone]    I, Wilburn Mylar, am acting as scribe for Dr. [name]. Marjie Chea.  I, Lurline Del MD, have reviewed the above documentation for accuracy and completeness, and I agree with the above.   *Total Encounter Time as defined by the Centers for Medicare and Medicaid Services includes, in addition to the face-to-face time of a patient visit (documented in the note above) non-face-to-face time: obtaining and reviewing outside history, ordering and reviewing medications, tests or procedures, care coordination (communications with other health care professionals or caregivers) and documentation in the medical record.

## 2019-10-29 ENCOUNTER — Other Ambulatory Visit: Payer: Self-pay | Admitting: Oncology

## 2019-10-29 DIAGNOSIS — C50912 Malignant neoplasm of unspecified site of left female breast: Secondary | ICD-10-CM

## 2019-12-05 ENCOUNTER — Other Ambulatory Visit: Payer: Self-pay | Admitting: Oncology

## 2019-12-21 ENCOUNTER — Other Ambulatory Visit: Payer: BC Managed Care – PPO

## 2019-12-21 ENCOUNTER — Telehealth (INDEPENDENT_AMBULATORY_CARE_PROVIDER_SITE_OTHER): Payer: BC Managed Care – PPO | Admitting: Family

## 2019-12-21 DIAGNOSIS — R197 Diarrhea, unspecified: Secondary | ICD-10-CM

## 2019-12-21 DIAGNOSIS — Z8619 Personal history of other infectious and parasitic diseases: Secondary | ICD-10-CM | POA: Diagnosis not present

## 2019-12-21 NOTE — Progress Notes (Signed)
Erin [name] is a 59 y.o. female with the following history as recorded in Epic[employer]:  Patient Active Problem List   Diagnosis Date Noted  . Diarrhea 10/14/2019  . Abdominal pain 10/14/2019  . Routine general medical examination at a health care facility 05/18/2019  . Bone metastases ([place]) 03/26/2019  . Bone lesion 02/15/2019  . Pes cavus 05/12/2017  . Acute sinusitis 01/31/2017  . De Quervain's disease (tenosynovitis) 12/02/2016  . Lesion of pelvic bone 03/22/2016  . Recurrent cancer of left breast ([place]) 05/06/2014  . Endometrial hyperplasia 10/02/2013  . Malignant neoplasm of upper-outer quadrant of left breast in female, estrogen receptor positive ([place]) 02/23/2013  . Hypothyroidism 01/15/2013  . Osteopenia 09/04/2011  . Goals of care, counseling/discussion 09/04/2011  . Hypertension 03/08/2011  . Allergic rhinitis 03/08/2011    Current Outpatient Medications  Medication Sig Dispense Refill  . acetaminophen (TYLENOL) 500 MG tablet Take 500 mg by mouth every 6 (six) hours as needed for moderate pain or headache.    Marland Kitchen acyclovir ointment (ZOVIRAX) 5 % Apply 1 application topically every 3 (three) hours. As needed 5 g 5  . anastrozole (ARIMIDEX) 1 MG tablet TAKE 1 TABLET(1 MG) BY MOUTH DAILY 90 tablet 4  . Cholecalciferol (VITAMIN D3) 2000 UNITS capsule Take 2,000 Units by mouth daily.      . fexofenadine (ALLEGRA) 180 MG tablet Take 180 mg by mouth daily as needed for allergies or rhinitis.    . fluocinolone (SYNALAR) 0.01 % external solution Apply 1 application topically 2 (two) times daily as needed (irritation).     Marland Kitchen ibuprofen (ADVIL,MOTRIN) 200 MG tablet Take 200 mg by mouth every 6 (six) hours as needed for moderate pain.    Marland Kitchen ketoconazole (NIZORAL) 2 % shampoo Apply 1 application topically 2 (two) times a week.    . levothyroxine (SYNTHROID) 75 MCG tablet TAKE 1 TABLET(75 MCG) BY MOUTH DAILY BEFORE BREAKFAST 90 tablet 3  . losartan (COZAAR) 100 MG tablet TAKE 1 TABLET(100  MG) BY MOUTH DAILY 90 tablet 3  . Olopatadine HCl 0.6 % SOLN Place 2 sprays into both nostrils 2 (two) times daily as needed (allergies).     Vladimir Faster Glycol-Propyl Glycol (SYSTANE OP) Place 1 drop into both eyes daily as needed (for allergies).     . sodium chloride (OCEAN) 0.65 % SOLN nasal spray Place 1 spray into both nostrils as needed for congestion.    . Turmeric 500 MG CAPS Take 500 mg by mouth daily.      No current facility-administered medications for this visit.    Allergies: Pollen extract and Adhesive [tape]  Past Medical History:  Diagnosis Date  . Allergic rhinitis due to pollen   . Breast cancer ([place]) 2010  . Breast cancer ([place]) 2016   reoccurrence in scar  . History of radiation therapy 04/26/19- 05/05/19   Right ischium 5 fractions of 10 Gy each to total 50 Gy.   Marland Kitchen Hypertension   . Hypothyroidism   . Osteopenia   . S/P radiation therapy 07/12/2014 through 08/16/2014    Left chest wall 4000 cGy in 20 sessions, left chest wall boost 1000 cGy in 5 sessions  . Wears glasses     Past Surgical History:  Procedure Laterality Date  . BREAST LUMPECTOMY Left 06/01/2014   Procedure: WIDE LOCAL EXCISION OF RECURRENT LEFT BREAST CANCER;  Surgeon: Rolm Bookbinder, MD;  Location: [facility];  Service: General;  Laterality: Left;  . COLONOSCOPY    . cyst removed  left lower jaw - at age 67 yr  . DILATATION & CURRETTAGE/HYSTEROSCOPY WITH RESECTOCOPE N/A 09/16/2013   Procedure: DILATATION & CURETTAGE/HYSTEROSCOPY WITH RESECTOCOPE;  Surgeon: Princess Bruins, MD;  Location: [facility];  Service: Gynecology;  Laterality: N/A;  1 hr.  . INGUINAL HERNIA REPAIR     right side as infant  . LAPAROSCOPIC PELVIC LYMPH NODE BIOPSY    . MASTECTOMY  2010   bilateral mastectomies-lt snbx-radiation post  . MASTECTOMY Bilateral   . OOPHORECTOMY  2016   patient denies  . Right knee arthroscopy  2008  . ROBOTIC ASSISTED  BILATERAL SALPINGO OOPHERECTOMY Bilateral 03/13/2015   Procedure: ROBOTIC ASSISTED BILATERAL SALPINGO OOPHORECTOMY With Washings;  Surgeon: Princess Bruins, MD;  Location: [facility];  Service: Gynecology;  Laterality: Bilateral;  . TONSILLECTOMY    . WISDOM TOOTH EXTRACTION      Family History  Problem Relation Age of Onset  . Hypertension Mother   . Cancer Mother        Lung Cancer - long history of tobacco use  . Hypertension Father   . Stroke Maternal Grandfather   . Stroke Paternal Grandfather   . Hypertension Brother   . Colon cancer Neg Hx   . Stomach cancer Neg Hx     Social History   Tobacco Use  . Smoking status: Never Smoker  . Smokeless tobacco: Never Used  Substance Use Topics  . Alcohol use: No    Alcohol/week: 0.0 standard drinks    Comment: rare    Subjective:   I connected with [name] on 12/21/19 at 11:00 AM EDT by a video enabled telemedicine application and verified that I am speaking with the correct person using two identifiers.   I discussed the limitations of evaluation and management by telemedicine and the availability of in person appointments. The patient expressed understanding and agreed to proceed. Provider in office/ patient is at home; provider and patient are only 2 people on video call.   For the past 2 months, has been having increased gas/ increased bowel movements; was found to have a C. Diff infection at the end of June; has completed Vancomycin but is still having symptoms; notes that symptoms are improving but symptoms are "not quite right."      Objective:  There were no vitals filed for this visit.  General: Well developed, well nourished, in no acute distress  Head: Normocephalic and atraumatic  Lungs: Respirations unlabored;  Neurologic: Alert and oriented; speech intact; face symmetrical; moves all extremities well;   Assessment:  1. Diarrhea, unspecified type   2. History of Clostridioides difficile infection      Plan:  ? If C. Diff infection is still present or other cause for symptoms; will re-check GI culture to evaluate for possible infection; if infection has cleared, will need to refer patient to GI for further evaluation; discussed trial of OTC probiotic like Align- patient will consider trial based on culture results; follow up to be determined;  Time spent 30 minutes discussing symptoms and answering patient's questions  No follow-ups on file.  Orders Placed This Encounter  Procedures  . Gastrointestinal Pathogen Panel PCR    Standing Status:   Future    Standing Expiration Date:   12/20/2020  . C. difficile GDH and Toxin A/B    Standing Status:   Future    Standing Expiration Date:   12/20/2020    Requested Prescriptions    No prescriptions requested or ordered in this encounter

## 2019-12-22 ENCOUNTER — Other Ambulatory Visit: Payer: BC Managed Care – PPO

## 2019-12-22 DIAGNOSIS — R197 Diarrhea, unspecified: Secondary | ICD-10-CM

## 2019-12-24 ENCOUNTER — Other Ambulatory Visit: Payer: Self-pay | Admitting: Family

## 2019-12-24 ENCOUNTER — Encounter: Payer: Self-pay | Admitting: Family

## 2019-12-24 DIAGNOSIS — R197 Diarrhea, unspecified: Secondary | ICD-10-CM

## 2019-12-24 LAB — GI PROFILE, STOOL, PCR

## 2019-12-24 LAB — C. DIFFICILE GDH AND TOXIN A/B
GDH ANTIGEN: DETECTED
MICRO NUMBER:: 10899419
SPECIMEN QUALITY:: ADEQUATE
TOXIN A AND B: NOT DETECTED

## 2019-12-24 LAB — CLOSTRIDIUM DIFFICILE TOXIN B, QUALITATIVE, REAL-TIME PCR: Toxigenic C. Difficile by PCR: NOT DETECTED

## 2020-01-21 ENCOUNTER — Ambulatory Visit: Payer: BC Managed Care – PPO

## 2020-01-21 ENCOUNTER — Ambulatory Visit (INDEPENDENT_AMBULATORY_CARE_PROVIDER_SITE_OTHER): Payer: BC Managed Care – PPO

## 2020-01-21 ENCOUNTER — Other Ambulatory Visit: Payer: Self-pay

## 2020-01-21 ENCOUNTER — Other Ambulatory Visit: Payer: BC Managed Care – PPO

## 2020-01-21 DIAGNOSIS — Z23 Encounter for immunization: Secondary | ICD-10-CM

## 2020-01-26 ENCOUNTER — Encounter: Payer: Self-pay | Admitting: Nurse Practitioner

## 2020-01-26 ENCOUNTER — Ambulatory Visit (INDEPENDENT_AMBULATORY_CARE_PROVIDER_SITE_OTHER): Payer: BC Managed Care – PPO | Admitting: Nurse Practitioner

## 2020-01-26 VITALS — BP 138/76 | HR 74 | Ht 64.25 in | Wt 157.0 lb

## 2020-01-26 DIAGNOSIS — Z8619 Personal history of other infectious and parasitic diseases: Secondary | ICD-10-CM | POA: Diagnosis not present

## 2020-01-26 NOTE — Patient Instructions (Signed)
If you are age 58 or older, your body mass index should be between 23-30. Your Body mass index is 26.74 kg/m. If this is out of the aforementioned range listed, please consider follow up with your Primary Care Provider.  If you are age 32 or younger, your body mass index should be between 19-25. Your Body mass index is 26.74 kg/m. If this is out of the aformentioned range listed, please consider follow up with your Primary Care Provider.   Continue Align for an additional month.  Call if any recurrent symptoms.

## 2020-01-26 NOTE — Progress Notes (Signed)
ASSESSMENT AND PLAN    # C-Diff, resolving. --She had several questions about C-diff --Interesting, she had not had any antibiotics for 6 months prior to getting C-diff but no other obvious risk factors. Not on PPI --Treated  By PCP with Vancomycin. --Development worker, community ( started a month ago) for another month --Call us if any recurrent bowel changes.  --If requires antibiotics in the future would take florastor along with the antibiotics and for two months thereafter.    # Colon cancer screening --Last colonoscopy in 2023. No polyps. Prep was fair --She is up to date on screening. Had a negative Cologuard Dec 2020.  --Needs repeat Cologuard in Dec 2023. She may choose to have colonoscopy instead. If so, needs a 2 day bowel prep    HISTORY OF PRESENT ILLNESS     Primary Gastroenterologist :Zenovia Jarred, MD  Chief Complaint : Recent C-diff   Erin [name] is a 59 y.o. female with PMH / [place] significant for,  but not necessarily limited to: glaucoma, osteopenia, HTN, hypothyroidism, hx of breast cancer s/p bilateral mastectomies and XRT in 2010. Had recurrence of cancer in mastectomy scar in 2016. Currently on tamoxifen  Erin [name] was diagnosed with C-diff 10/21/19. She wasn't having diarrhea but was just more frequent, larger volume stools.  Her last antibiotic use was 6 months prior. PCP treated her with 10 days of Vancomycin but bowel movements didn't return to normal as stools were still more frequent with some urgency but still solid.  She called PCP and repeat C-diff and GI profile panel were negative on 12/22/19. She was started on Align probiotics about a month and bowel movements are getting back to normal. Now averaging one solid stool / day.    Previous Endoscopic Evaluations / Pertinent Studies:   Last colonoscopy in 2013 - diverticulosis. Prep was fair.   Negative Cologuard 03/30/20    Past Medical History:  Diagnosis Date  . Allergic rhinitis due to pollen   . Breast  cancer ([place]) 2010  . Breast cancer ([place]) 2016   reoccurrence in scar  . History of radiation therapy 04/26/19- 05/05/19   Right ischium 5 fractions of 10 Gy each to total 50 Gy.   Marland Kitchen Hypertension   . Hypothyroidism   . Osteopenia   . S/P radiation therapy 07/12/2014 through 08/16/2014    Left chest wall 4000 cGy in 20 sessions, left chest wall boost 1000 cGy in 5 sessions  . Wears glasses     Current Medications, Allergies, Past Surgical History, Family History and Social History were reviewed in [employer] record.   Current Outpatient Medications  Medication Sig Dispense Refill  . acetaminophen (TYLENOL) 500 MG tablet Take 500 mg by mouth every 6 (six) hours as needed for moderate pain or headache.    Marland Kitchen acyclovir ointment (ZOVIRAX) 5 % Apply 1 application topically every 3 (three) hours. As needed 5 g 5  . anastrozole (ARIMIDEX) 1 MG tablet TAKE 1 TABLET(1 MG) BY MOUTH DAILY 90 tablet 4  . Cholecalciferol (VITAMIN D3) 2000 UNITS capsule Take 2,000 Units by mouth daily.      . fexofenadine (ALLEGRA) 180 MG tablet Take 180 mg by mouth daily as needed for allergies or rhinitis.    . fluocinolone (SYNALAR) 0.01 % external solution Apply 1 application topically 2 (two) times daily as needed (irritation).     Marland Kitchen ibuprofen (ADVIL,MOTRIN) 200 MG tablet Take 200 mg by mouth every 6 (six) hours as needed for  moderate pain.    Marland Kitchen ketoconazole (NIZORAL) 2 % shampoo Apply 1 application topically 2 (two) times a week.    . levothyroxine (SYNTHROID) 75 MCG tablet TAKE 1 TABLET(75 MCG) BY MOUTH DAILY BEFORE BREAKFAST 90 tablet 3  . losartan (COZAAR) 100 MG tablet TAKE 1 TABLET(100 MG) BY MOUTH DAILY 90 tablet 3  . Olopatadine HCl 0.6 % SOLN Place 2 sprays into both nostrils 2 (two) times daily as needed (allergies).     Vladimir Faster Glycol-Propyl Glycol (SYSTANE OP) Place 1 drop into both eyes daily as needed (for allergies).     .  Probiotic Product (ALIGN) 4 MG CAPS Take 1 capsule by mouth daily.    . sodium chloride (OCEAN) 0.65 % SOLN nasal spray Place 1 spray into both nostrils as needed for congestion.    . Turmeric 500 MG CAPS Take 500 mg by mouth daily.      No current facility-administered medications for this visit.    Review of Systems: No chest pain. No shortness of breath. No urinary complaints.   PHYSICAL EXAM :    Wt Readings from Last 3 Encounters:  01/26/20 157 lb (71.2 kg)  10/28/19 160 lb 1.6 oz (72.6 kg)  05/13/19 163 lb 4.8 oz (74.1 kg)    BP 138/76   Pulse 74   Ht 5' 4.25" (1.632 m)   Wt 157 lb (71.2 kg)   LMP  (LMP Unknown)   BMI 26.74 kg/m  Constitutional:  Pleasant female in no acute distress. Psychiatric: Normal mood and affect. Behavior is normal. EENT: Pupils normal.  Conjunctivae are normal. No scleral icterus. Neck supple.  Cardiovascular: Normal rate, regular rhythm. No edema Pulmonary/chest: Effort normal and breath sounds normal. No wheezing, rales or rhonchi. Abdominal: Soft, nondistended, nontender. Bowel sounds active throughout. There are no masses palpable. No hepatomegaly. Neurological: Alert and oriented to person place and time. Skin: Skin is warm and dry. No rashes noted.  Tye Savoy, NP  01/26/2020, 9:18 AM  I spent 30 minutes total reviewing records, obtaining history, performing exam, counseling patient and documenting visit / findings.    Cc:  Referring Provider Marrian Salvage,*

## 2020-01-26 NOTE — Progress Notes (Signed)
Addendum: Reviewed and agree with assessment and management plan. Note last colonoscopy date should be 2013, not 2023 Shaniyah Wix, Lajuan Lines, MD

## 2020-02-09 ENCOUNTER — Telehealth: Payer: Self-pay | Admitting: Adult Health

## 2020-02-09 ENCOUNTER — Telehealth: Payer: Self-pay

## 2020-02-09 NOTE — Telephone Encounter (Signed)
[phone].  Member Information: CHELESA, WEINGARTNER Member #: [id number] [place] , [place] [id number] Date of Birth: July 25, 1960 Phone: [phone]  Called to complete peer to peer.  I was on hold for about 20 minutes and was informed that a physician would not be able to review right now, and that I will get a return call within 24 hours.    Wilber Bihari, NP

## 2020-02-09 NOTE — Telephone Encounter (Signed)
This LPN spoke with pt today to let her know that we need to obtain a P2P in order to have her MRI approved. Wilber Bihari, NP is working on this and a physician is supposed to get in touch with her in 24 hours. Pt understands this and will call for f/u.

## 2020-02-09 NOTE — Telephone Encounter (Signed)
Pt called and LVM concerning status of PA for her MRI scheduled 10/26. Currently insurance is requiring P2P to authorize this scan and the providers are in this process. This LPN attempted to call pt to explain this to her, but no answer. LVM for pt to return my call so I could explain this to her.

## 2020-02-10 ENCOUNTER — Ambulatory Visit (HOSPITAL_COMMUNITY): Payer: BC Managed Care – PPO

## 2020-02-11 ENCOUNTER — Other Ambulatory Visit: Payer: Self-pay

## 2020-02-11 ENCOUNTER — Ambulatory Visit (INDEPENDENT_AMBULATORY_CARE_PROVIDER_SITE_OTHER)
Admission: RE | Admit: 2020-02-11 | Discharge: 2020-02-11 | Disposition: A | Discharge status: Home / Self Care | Payer: BC Managed Care – PPO | Source: Ambulatory Visit | Attending: Internal Medicine | Admitting: Internal Medicine

## 2020-02-11 DIAGNOSIS — M8589 Other specified disorders of bone density and structure, multiple sites: Secondary | ICD-10-CM

## 2020-02-12 DIAGNOSIS — M8589 Other specified disorders of bone density and structure, multiple sites: Secondary | ICD-10-CM | POA: Diagnosis not present

## 2020-02-14 ENCOUNTER — Telehealth: Payer: Self-pay | Admitting: Adult Health

## 2020-02-14 NOTE — Telephone Encounter (Signed)
Called to request an appeal on patient MRI pelvis denial as patient bone metastases is only visualized on MRI and not seen on bone scan.  I left a message on the appeal line and left my office number and cell phone number for them to return my call.  Wilber Bihari, NP

## 2020-02-15 ENCOUNTER — Ambulatory Visit (HOSPITAL_COMMUNITY)
Admission: RE | Admit: 2020-02-15 | Discharge: 2020-02-15 | Disposition: A | Discharge status: Home / Self Care | Payer: BC Managed Care – PPO | Source: Ambulatory Visit | Attending: Oncology | Admitting: Oncology

## 2020-02-15 ENCOUNTER — Ambulatory Visit (HOSPITAL_COMMUNITY): Payer: BC Managed Care – PPO

## 2020-02-15 ENCOUNTER — Other Ambulatory Visit: Payer: Self-pay

## 2020-02-15 ENCOUNTER — Other Ambulatory Visit: Payer: Self-pay | Admitting: *Deleted

## 2020-02-15 DIAGNOSIS — C50912 Malignant neoplasm of unspecified site of left female breast: Secondary | ICD-10-CM | POA: Insufficient documentation

## 2020-02-15 MED ORDER — GADOBUTROL 1 MMOL/ML IV SOLN
7.0000 mL | Freq: Once | INTRAVENOUS | Status: AC | PRN
  Administered 2020-02-15: 7 mL via INTRAVENOUS

## 2020-02-15 NOTE — Progress Notes (Signed)
Erin [name]  [phone] [phone]   ID: Erin [name] DOB: 1960-06-09 MR#: [id number] CSN#: [id number]     Patient Care Team: [name], MD as PCP - General (Internal Medicine) Curley Fayette, [name], MD as Consulting Physician (Oncology) Rolm Bookbinder, MD as Consulting Physician (General Surgery) Princess Bruins, MD as Consulting Physician (Obstetrics and Gynecology) Hilarie Fredrickson, Lajuan Lines, MD as Consulting Physician (Gastroenterology) Star Age, MD as Attending Physician (Neurology) Augustina Mood, DDS as Referring Physician (Dentistry) Lyndal Pulley, DO as Consulting Physician (Family Medicine) Jari Pigg, MD as Consulting Physician (Dermatology) Eppie Gibson, MD as Attending Physician (Radiation Oncology) OTHER MD: Netty Starring  CHIEF COMPLAINT: Recurrent breast cancer (s/p bilateral mastectomies)  CURRENT THERAPY: anastrozole; denosumab/Xgeva   INTERVAL HISTORY: Erin [name] returns today for follow-up of her recurrent breast cancer.   She continues on anastrozole, with good tolerance.  Hot flashes and vaginal dryness are not major concerns.  Since her last visit, she underwent bone density screening on 02/11/2020 showing a T-score of -1.4, which is considered osteopenic.  Recall she took Prolia previously, until May 2019.  She was started on Xgeva given every 12 weeks January 2021.  She has a dose due today.  She also underwent pelvis MRI yesterday, 02/15/2020 showing:   1. The right ischial tuberosity lesion has reduced in size and conspicuity compared to 02/12/2019, with some interspersed fatty marrow signal now extending into the vicinity of the lesion previously the lesion was homogeneously abnormal. Appearance suggests considerable improvement, there still some small foci of enhancement corresponding to the original lesion. Accentuated enhancement tracking along soft tissue fascia planes adjacent to the right ischium,  probably a result of radiation therapy (total of 50 Gy administered in January, 2021). No new osseous metastatic lesion in the pelvis. 2. Trace left trochanteric bursitis. 3. Small uterine fibroids.   REVIEW OF SYSTEMS: Erin [name] continues to work full-time.  She is not doing traveling because of the pandemic although she is planning to go to a conference in April 2022 if it is not virtual.  She exercises regularly mostly by walking, occasionally using an elliptical.  She is not yet going back to the gym.  She has received both doses of the Pfizer vaccine and has signed up for the booster this weekend.  A detailed review of systems today was otherwise stable.   BREAST CANCER HISTORY From the original intake note 04/07/2011:  Erin [name] had routine screening mammography December of 2009 at the [facility] in [place], [place], showing an increasing asymmetry in her left breast. She was recalled for additional views December 29. She was noted to have heterogeneously dense breasts. It was a 5 cm area of architectural distortion in the lateral aspect of the left breast with no associated calcifications. There was a second lesion medial to this. Ultrasound showed a highly suspicious hypoechoic irregularly marginated mass measuring 3 cm at the 2:30 position 5 cm from the nipple. This was palpable to the mammographer. The second area in question I measured 7 mm and a third lesion was noted measuring 5 mm. Some left axillary lymph nodes were morphologically normal.  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 Bilateral breast MRIs were performed 05/04/2008 and showed, in the left breast, a large lobulated mass measuring up to 4.3 cm, and including both of the apparently separate masses the  previously biopsied. In the right breast there were 2  indeterminate lesions. These were evaluated further with breast specific gamma imaging performed January 14 in both lesions were negative. The lesion in the left breast was markedly abnormal.  Given this complex history and with the background of significant breast density, the patient opted for bilateral mastectomies with left sentinel lymph node sampling. This was performed of 06/27/2008 and showed([id number]) in the right breast, no malignancy. In the left breast there was a 3.2 cm invasive ductal carcinoma, grade 1, focally extending to the anterior margin of the lower outer quadrant. There was extensive angiolymphatic invasion, but both sentinel lymph nodes on the left were negative.  The patient received left-sided postmastectomy radiation including of the left chest wall and left supraclavicular fossa to a total dose of 50.4 Gy plus a 10 Gy scar boost. She had an Oncotype DX recurrence score of 17, further discussed below. She decided to forego reconstruction. She was tested for BRCA1 and 2 and was found to be negative. Given her overall prognosis, she did not receive chemotherapy, but started tamoxifen February of 2010, with good tolerance.  RECURRENT DISEASE:  SUMMARY OF PRESENTATION  Erin [name] took tamoxifen for 5 years with no evidence of active disease. In May 2015 we decided to stop the tamoxifen because of problems with endometrial hyperplasia. 6 months later, at the November visit, she was found to have a change in a small area of scaring her left inframammary fold. She was referred to dermatology and biopsy 04/21/2014 showed ([id number]) recurrent ductal adenocarcinoma (positive for gross cystic disease fluid protein, estrogen, and progesterone). She then underwent a staging PET scan in [facility] 05/06/2014. This showed a 1.9 cm sclerotic lesion in the right ischial tuberosity, with a maximum standard uptake value of 4.6. A sclerotic lesion in the left side of the L3 vertebral body  measuring 1.6 cm had no hypermetabolic activity.  Her subsequent history is as detailed below.   PAST MEDICAL HISTORY: Past Medical History:  Diagnosis Date  . Allergic rhinitis due to pollen   . Breast cancer ([place]) 2010  . Breast cancer ([place]) 2016   reoccurrence in scar  . History of radiation therapy 04/26/19- 05/05/19   Right ischium 5 fractions of 10 Gy each to total 50 Gy.   Marland Kitchen Hypertension   . Hypothyroidism   . Osteopenia   . S/P radiation therapy 07/12/2014 through 08/16/2014    Left chest wall 4000 cGy in 20 sessions, left chest wall boost 1000 cGy in 5 sessions  . Wears glasses   Diverticulosis     PAST SURGICAL HISTORY: Past Surgical History:  Procedure Laterality Date  . BREAST LUMPECTOMY Left 06/01/2014   Procedure: WIDE LOCAL EXCISION OF RECURRENT LEFT BREAST CANCER;  Surgeon: Rolm Bookbinder, MD;  Location: [facility];  Service: General;  Laterality: Left;  . COLONOSCOPY    . cyst removed     left lower jaw - at age 65 yr  . DILATATION & CURRETTAGE/HYSTEROSCOPY WITH RESECTOCOPE N/A 09/16/2013   Procedure: DILATATION & CURETTAGE/HYSTEROSCOPY WITH RESECTOCOPE;  Surgeon: Princess Bruins, MD;  Location: [facility];  Service: Gynecology;  Laterality: N/A;  1 hr.  . INGUINAL HERNIA REPAIR     right side as infant  . LAPAROSCOPIC PELVIC LYMPH NODE BIOPSY    . MASTECTOMY  2010   bilateral mastectomies-lt snbx-radiation post  . MASTECTOMY Bilateral   . OOPHORECTOMY  2016   patient denies  . Right knee arthroscopy  2008  . ROBOTIC ASSISTED BILATERAL SALPINGO OOPHERECTOMY Bilateral 03/13/2015   Procedure: ROBOTIC ASSISTED BILATERAL SALPINGO OOPHORECTOMY With Washings;  Surgeon: Princess Bruins, MD;  Location: [facility];  Service: Gynecology;  Laterality: Bilateral;  . TONSILLECTOMY    . WISDOM TOOTH EXTRACTION      FAMILY HISTORY Family History  Problem Relation Age of Onset  . Hypertension Mother   .  Cancer Mother        Lung Cancer - long history of tobacco use  . Hypertension Father   . Stroke Maternal Grandfather   . Stroke Paternal Grandfather   . Hypertension Brother   . Colon cancer Neg Hx   . Stomach cancer Neg Hx   The patient's mother died in 5726 from complications of lung cancer. The patient has not been in touch with her father her for approximately 30 years. She had no sisters, 1 brother, who is in good health. There is no breast or ovarian cancer in the family to her knowledge.   GYNECOLOGIC HISTORY:  GX P0, menarche at around age 84, the patient continued to have periods despite being on tamoxifen, although more irregularly. She is now postmenopausal (status post BSO).   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. She lives alone and has no pets. Her work involves quite a bit of travel, though this has been interrupted by the pandemic.Marland Kitchen   ADVANCED DIRECTIVES:  Eleonore Chiquito is HCPOA--she can be reached at [phone]   HEALTH MAINTENANCE:     Social History   Tobacco Use  . Smoking status: Never Smoker  . Smokeless tobacco: Never Used  Vaping Use  . Vaping Use: Never used  Substance Use Topics  . Alcohol use: No    Alcohol/week: 0.0 standard drinks    Comment: rare  . Drug use: No      Colonoscopy: December 2013 [facility] Mares)  PAP: Feb 2011  Bone density: 06/2009, normal  Cholesterol: "good"    Allergies  Allergen Reactions  . Pollen Extract   . Adhesive [Tape] Dermatitis and Rash    Paper tape is better to use    MEDICATIONS:   Current Outpatient Medications  Medication Sig Dispense Refill  . acetaminophen (TYLENOL) 500 MG tablet Take 500 mg by mouth every 6 (six) hours as needed for moderate pain or headache.    Marland Kitchen acyclovir ointment (ZOVIRAX) 5 % Apply 1 application topically every 3 (three) hours. As needed 5 g 5  . anastrozole (ARIMIDEX) 1 MG tablet TAKE 1 TABLET(1 MG) BY MOUTH DAILY 90 tablet 4  . Cholecalciferol (VITAMIN D3) 2000 UNITS  capsule Take 2,000 Units by mouth daily.      . fexofenadine (ALLEGRA) 180 MG tablet Take 180 mg by mouth daily as needed for allergies or rhinitis.    . fluocinolone (SYNALAR) 0.01 % external solution Apply 1 application topically 2 (two) times daily as needed (irritation).     Marland Kitchen ibuprofen (ADVIL,MOTRIN) 200 MG tablet Take 200 mg by mouth every 6 (six) hours as needed for moderate pain.    Marland Kitchen ketoconazole (NIZORAL) 2 % shampoo Apply 1 application topically 2 (two) times a week.    . levothyroxine (SYNTHROID) 75 MCG tablet TAKE 1 TABLET(75 MCG) BY MOUTH DAILY BEFORE BREAKFAST 90 tablet 3  . losartan (COZAAR) 100 MG tablet TAKE 1 TABLET(100 MG) BY MOUTH DAILY 90 tablet 3  . Olopatadine HCl 0.6 % SOLN Place 2 sprays into both nostrils 2 (two) times daily as needed (allergies).     Marland Kitchen  Polyethyl Glycol-Propyl Glycol (SYSTANE OP) Place 1 drop into both eyes daily as needed (for allergies).     . Probiotic Product (ALIGN) 4 MG CAPS Take 1 capsule by mouth daily.    . sodium chloride (OCEAN) 0.65 % SOLN nasal spray Place 1 spray into both nostrils as needed for congestion.    . Turmeric 500 MG CAPS Take 500 mg by mouth daily.      No current facility-administered medications for this visit.    OBJECTIVE:  white woman who appears younger than stated age    66:   02/16/20 0952  BP: 123/73  Pulse: 79  Resp: 18  Temp: 97.9 F (36.6 C)  SpO2: 97%     Body mass index is 26.76 kg/m.   ECOG FS: 1 - Symptomatic but completely ambulatory  Sclerae unicteric, EOMs intact Wearing a mask No cervical or supraclavicular adenopathy Lungs no rales or rhonchi Heart regular rate and rhythm Abd soft, nontender, positive bowel sounds MSK no focal spinal tenderness, no upper extremity lymphedema Neuro: nonfocal, well oriented, appropriate affect Breasts: The right breast is status post mastectomy.  There is no evidence of local recurrence.  The left breast is status post mastectomy and radiation.  There  are some telangiectasias.  There is no evidence of local recurrence.  Both axillae are benign.   LABS:           Chemistry      Component Value Date/Time   NA 139 10/28/2019 1318   NA 139 02/28/2017 1521   K 4.5 10/28/2019 1318   K 4.6 02/28/2017 1521   CL 104 10/28/2019 1318   CL 105 08/10/2012 0944   CO2 26 10/28/2019 1318   CO2 29 02/28/2017 1521   BUN 16 10/28/2019 1318   BUN 20.0 02/28/2017 1521   CREATININE 1.00 10/28/2019 1318   CREATININE 1.0 02/28/2017 1521      Component Value Date/Time   CALCIUM 9.3 10/28/2019 1318   CALCIUM 10.4 02/28/2017 1521   ALKPHOS 69 10/28/2019 1318   ALKPHOS 62 02/28/2017 1521   AST 16 10/28/2019 1318   AST 18 02/28/2017 1521   ALT 13 10/28/2019 1318   ALT 13 02/28/2017 1521   BILITOT 1.1 10/28/2019 1318   BILITOT 1.17 02/28/2017 1521         Lab Results  Component Value Date   WBC 6.2 02/16/2020   HGB 15.3 (H) 02/16/2020   HCT 45.0 02/16/2020   MCV 86.0 02/16/2020   PLT 211 02/16/2020   NEUTROABS 4.5 02/16/2020    STUDIES  MR Pelvis W Wo Contrast  Result Date: 02/16/2020 CLINICAL DATA:  Right ischial lesion compatible with metastatic disease EXAM: MRI PELVIS WITHOUT AND WITH CONTRAST TECHNIQUE: Multiplanar multisequence MR imaging of the pelvis was performed both before and after administration of intravenous contrast. CONTRAST:  63mL GADAVIST GADOBUTROL 1 MMOL/ML IV SOLN COMPARISON:  None. Multiple exams, including 02/12/2019 and PET-CT from 02/22/2019 FINDINGS: Bones and joints: Compared to 02/12/2019, there has reduced size and conspicuity of the right ischial lesion, measuring in total about 2.0 by 1.2 cm on image 37 of series 6 (formerly 2.7 by 2.0 cm), and with some interspersed fatty marrow signal now extending into the vicinity of the lesion whereas previously the lesion was homogeneously abnormal. Small amount of multifocal enhancement in this vicinity for. There is some abnormal accentuated enhancement along the  periosteum of the right ischium and adjacent musculature for example on images 34 through 37 of series 10,  possibly a result of radiation therapy (total of 50 Gy administered in January, 2021). This does not have the masslike appearance of typical extraosseous tumor extension. Associated low-grade edema tracks along the margins of the obturator internus muscle and adjacent hip adductor musculature. No new osseous metastatic lesion in the pelvis is identified. Bursa: Trace left trochanteric bursitis. Muscles and tendons As noted above, there is edema tracking in the soft tissues around the right ischial tuberosity, probably the result of prior radiation therapy. Other findings Small T2 hypointense uterine fibroids. Scattered sigmoid colon diverticula. IMPRESSION: 1. The right ischial tuberosity lesion has reduced in size and conspicuity compared to 02/12/2019, with some interspersed fatty marrow signal now extending into the vicinity of the lesion previously the lesion was homogeneously abnormal. Appearance suggests considerable improvement, there still some small foci of enhancement corresponding to the original lesion. Accentuated enhancement tracking along soft tissue fascia planes adjacent to the right ischium, probably a result of radiation therapy (total of 50 Gy administered in January, 2021). No new osseous metastatic lesion in the pelvis. 2. Trace left trochanteric bursitis. 3. Small uterine fibroids. Electronically Signed   By: Van Clines M.D.   On: 02/16/2020 07:53   DG Bone Density  Result Date: 02/12/2020 Date of study: 02/11/2020 Exam: DUAL X-RAY ABSORPTIOMETRY (DXA) FOR BONE MINERAL DENSITY (BMD) Instrument: [employer] Provider: PCP Indication: follow up for low BMD Comparison: none (please note that it is not possible to compare data from different instruments) Clinical data: Pt is a 59 y.o. female on Anastrazole therapy Results:  Lumbar spine L1-L4(L2,L3) Femoral neck  (FN) T-score -0.4 RFN: -1.4 LFN: -1.0 Change in BMD from previous DXA test (%) Down 2.8% Decrease 5.7%* (*) statistically significant Assessment: By the [facility] Criteria for diagnosis based on bone density, this patient has Low Bone Density Z Score compares the patients bone density to age, sex, and race matched controls.  Compared to age, sex, and race matched controls, this patient's bone density is Average FRAX 10-year fracture risk calculator: 7.3 % for any major fracture and 0.5 % for hip fracture.  Pharmacologic therapy is recommended if 10 year fracture risk is >20% for any major osteoporotic fracture or >3% for hip fracture.   L2/L3 vertebra had to be excluded from analysis due to DJD Comments: the technical quality of the study is good. WHO criteria for diagnosis of osteoporosis in postmenopausal women and in men 6 y/o or older: - normal: T-score -1.0 to + 1.0 - osteopenia/low bone density: T-score between -2.5 and -1.0 - osteoporosis: T-score below -2.5 - severe osteoporosis: T-score below -2.5 with history of fragility fracture Note: although not part of the WHO classification, the presence of a fragility fracture, regardless of the T-score, should be considered diagnostic of osteoporosis, provided other causes for the fracture have been excluded. RECOMMENDATIONS:  Recommend optimizing calcium (1200 mg/day) and vitamin D (800 IU/day) intake  Follow up BMD is recommended: in 2 years Interpreted by : Mack Guise, MD [facility]     ASSESSMENT: 59 y.o.  BRCA negative [place] woman ,  (1) status post bilateral mastectomies January 2010 for a left-sided upper outer quadrant T2 N0 (stage IIA) invasive ductal carcinoma, grade 1, strongly estrogen and progesterone receptor positive, HER-2 nonamplified,   (2) Oncotype DX recurrence score of 17, predicting a 10-12% distant recurrence risk over 10 years if her only adjuvant treatment is tamoxifen for 5 years  (3) status post Left  postmastectomy radiation including Left supraclavicular fossa (  5040 cGy in 28 sessions)  (4) on tamoxifen starting February of 2010, stopped May 2015 because of endometrial hyperplasia  (5) osteopenia-- T score -1.2 on repeated bone density scans 09/24/2011 and 12/12/2013  (a) bone density 01/30/2018 showed a T score of +0.1  (b) bone density 02/11/2020 showed a T score of -0.4   RECURRENT DISEASE (6) skin biopsy of a lesion under the left breast scar 04/21/2014 shows adenocarcinoma, gross cystic disease fluid protein, estrogen and progesterone receptor positive  (a) s/p wide excision with negative though close margins 06/01/2014, prognostic panel pending  (b) radiation to Left chest wall 4000 cGy in 20 sessions, left schest wall boost 1000 cGy in 5 session 07/12/2014 through 08/16/2014  (7) PET scan 05/06/2014 and bone scan show only one additional area of concern, in the right ischiial tuberosity--  (a) biopsy of the right ischial tuberosity 05/24/2014 showed no evidence of malignancy  (b) zolendronate started 06/06/2014, poorly tolerated  (c) denosumab/ Xgeva started 09/26/2014, switched to Prolia as of May 2017  (d) repeat bone scan 12/20/2014 shows no new/additional areas of disease  (e) restaging studies 07/07/2015 do not support a diagnosis of metastatic disease.  (f) MRI of the pelvis 04/13/2016 and 03/19/2018 shows the lesion in the right ischial tuberosity to be unchanged  (g) pelvic MRI 02/12/2019 shows slight growth and possible increased activity of the ischial lesion  (h) PET scan 02/22/2019 shows no other areas of uptake.  The right ischial lesion area has possibly slightly progressed as compared to prior  (i) repeat biopsy of the right ischial lesion shows very minute clusters of atypical epithelioid cells highlighted by cytokeratin AE1/AE3 and 8/18, GA TA 3+, with no gross cystic disease fluid protein positivity, and estrogen and progesterone receptor negative: this is  not metastatic breast cancer  (8) goserelin monthly started 05/09/2016, last dose 02/20/2015  (a) s/p BSO 03/13/2015 with benign pathology  (9) anastrozole started  08/29/2014  (a) bone density 12/13/2015 shows improvement, with the lumbar spine T score now at 0.1, the femoral neck -1.1, both increased  (b) continues on denosumab/Prolia most recent dose 09/05/2017  (c) repeat bone density 01/30/2018 shows a T score of 0.1  (d) denosumab/Prolia discontinued after 09/05/2017 dose  (10) status post  SBRT treatment /07/2019 through 05/05/2019 to the right ischial tuberosity lesion  (a) no uptake at the area in question on bone scan 10/21/2019  (11) denosumab/Xgeva resumed 05/13/2019, repeated every 12 weeks  (a) discontinued after 02/16/2020 dose  (b) Prolia resumed April 2022    PLAN: The results of Vicki's pelvic MRI are excellent.  They do show resolution of the lesion, with some residual inflammation secondary to radiation.  There are no other lesions.  Given all the information we have clearly this does not represent metastatic breast cancer.  It was a low grade epithelial lesion which was taken care of with radiation  We are continuing anastrozole for a total of 7 years.  That will take [place] to May 2023.  I am going to switch Erin [name] back to Prolia beginning next year, and this will be her last Xgeva dose today.  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  We will continue the Prolia until she completes her anastrozole and discontinue it at that point.  I think for the first time Erin [name] can see herself as cancer free and at some point no longer needing cancer follow-up  She will see me  again in 6 months with her next Prolia dose.  At that time we will plan on a PET scan in late 2022  She knows to call for any other issue that may develop before the next visit  Total encounter time 40 minutes.*  Jennavecia Schwier, [name], MD   02/16/20 10:04 AM Medical Oncology and Hematology [facility] [place], [place] [address] Tel. [phone]    Fax. [phone]    I, Wilburn Mylar, am acting as scribe for Dr. [name]. Edmar Blankenburg.  I, Lurline Del MD, have reviewed the above documentation for accuracy and completeness, and I agree with the above.   *Total Encounter Time as defined by the Centers for Medicare and Medicaid Services includes, in addition to the face-to-face time of a patient visit (documented in the note above) non-face-to-face time: obtaining and reviewing outside history, ordering and reviewing medications, tests or procedures, care coordination (communications with other health care professionals or caregivers) and documentation in the medical record.

## 2020-02-16 ENCOUNTER — Inpatient Hospital Stay: Payer: BC Managed Care – PPO

## 2020-02-16 ENCOUNTER — Other Ambulatory Visit: Payer: Self-pay

## 2020-02-16 ENCOUNTER — Inpatient Hospital Stay: Payer: BC Managed Care – PPO | Attending: Oncology

## 2020-02-16 ENCOUNTER — Inpatient Hospital Stay (HOSPITAL_BASED_OUTPATIENT_CLINIC_OR_DEPARTMENT_OTHER): Payer: BC Managed Care – PPO | Admitting: Oncology

## 2020-02-16 DIAGNOSIS — Z170 Estrogen receptor positive status [ER+]: Secondary | ICD-10-CM | POA: Diagnosis not present

## 2020-02-16 DIAGNOSIS — M818 Other osteoporosis without current pathological fracture: Secondary | ICD-10-CM | POA: Diagnosis not present

## 2020-02-16 DIAGNOSIS — N8500 Endometrial hyperplasia, unspecified: Secondary | ICD-10-CM

## 2020-02-16 DIAGNOSIS — C50912 Malignant neoplasm of unspecified site of left female breast: Secondary | ICD-10-CM

## 2020-02-16 DIAGNOSIS — K5790 Diverticulosis of intestine, part unspecified, without perforation or abscess without bleeding: Secondary | ICD-10-CM | POA: Diagnosis not present

## 2020-02-16 DIAGNOSIS — C50412 Malignant neoplasm of upper-outer quadrant of left female breast: Secondary | ICD-10-CM | POA: Insufficient documentation

## 2020-02-16 DIAGNOSIS — D219 Benign neoplasm of connective and other soft tissue, unspecified: Secondary | ICD-10-CM | POA: Diagnosis not present

## 2020-02-16 LAB — CBC WITH DIFFERENTIAL (CANCER CENTER ONLY)
Abs Immature Granulocytes: 0.01 10*3/uL (ref 0.00–0.07)
Basophils Absolute: 0 10*3/uL (ref 0.0–0.1)
Basophils Relative: 1 %
Eosinophils Absolute: 0.1 10*3/uL (ref 0.0–0.5)
Eosinophils Relative: 1 %
HCT: 45 % (ref 36.0–46.0)
Hemoglobin: 15.3 g/dL — ABNORMAL HIGH (ref 12.0–15.0)
Immature Granulocytes: 0 %
Lymphocytes Relative: 18 %
Lymphs Abs: 1.1 10*3/uL (ref 0.7–4.0)
MCH: 29.3 pg (ref 26.0–34.0)
MCHC: 34 g/dL (ref 30.0–36.0)
MCV: 86 fL (ref 80.0–100.0)
Monocytes Absolute: 0.5 10*3/uL (ref 0.1–1.0)
Monocytes Relative: 9 %
Neutro Abs: 4.5 10*3/uL (ref 1.7–7.7)
Neutrophils Relative %: 71 %
Platelet Count: 211 10*3/uL (ref 150–400)
RBC: 5.23 MIL/uL — ABNORMAL HIGH (ref 3.87–5.11)
RDW: 11.9 % (ref 11.5–15.5)
WBC Count: 6.2 10*3/uL (ref 4.0–10.5)
nRBC: 0 % (ref 0.0–0.2)

## 2020-02-16 LAB — CMP (CANCER CENTER ONLY)
ALT: 10 U/L (ref 0–44)
AST: 17 U/L (ref 15–41)
Albumin: 4.1 g/dL (ref 3.5–5.0)
Alkaline Phosphatase: 78 U/L (ref 38–126)
Anion gap: 9 (ref 5–15)
BUN: 23 mg/dL — ABNORMAL HIGH (ref 6–20)
CO2: 27 mmol/L (ref 22–32)
Calcium: 9.8 mg/dL (ref 8.9–10.3)
Chloride: 102 mmol/L (ref 98–111)
Creatinine: 0.93 mg/dL (ref 0.44–1.00)
GFR, Estimated: >60 mL/min (ref >60)
Glucose, Bld: 91 mg/dL (ref 70–99)
Potassium: 4.4 mmol/L (ref 3.5–5.1)
Sodium: 138 mmol/L (ref 135–145)
Total Bilirubin: 1.3 mg/dL — ABNORMAL HIGH (ref 0.3–1.2)
Total Protein: 7.2 g/dL (ref 6.5–8.1)

## 2020-02-16 MED ORDER — DENOSUMAB 120 MG/1.7ML ~~LOC~~ SOLN
SUBCUTANEOUS | Status: AC
  Filled 2020-02-16: qty 1.7

## 2020-02-16 MED ORDER — DENOSUMAB 120 MG/1.7ML ~~LOC~~ SOLN
120.0000 mg | Freq: Once | SUBCUTANEOUS | Status: AC
  Administered 2020-02-16: 120 mg via SUBCUTANEOUS

## 2020-02-16 NOTE — Patient Instructions (Signed)
Denosumab injection What is this medicine? DENOSUMAB (den oh sue mab) slows bone breakdown. Prolia is used to treat osteoporosis in women after menopause and in men, and in people who are taking corticosteroids for 6 months or more. Xgeva is used to treat a high calcium level due to cancer and to prevent bone fractures and other bone problems caused by multiple myeloma or cancer bone metastases. Xgeva is also used to treat giant cell tumor of the bone. This medicine may be used for other purposes; ask your health care provider or pharmacist if you have questions. COMMON BRAND NAME(S): Prolia, XGEVA What should I tell my health care provider before I take this medicine? They need to know if you have any of these conditions:  dental disease  having surgery or tooth extraction  infection  kidney disease  low levels of calcium or Vitamin D in the blood  malnutrition  on hemodialysis  skin conditions or sensitivity  thyroid or parathyroid disease  an unusual reaction to denosumab, other medicines, foods, dyes, or preservatives  pregnant or trying to get pregnant  breast-feeding How should I use this medicine? This medicine is for injection under the skin. It is given by a health care professional in a hospital or clinic setting. A special MedGuide will be given to you before each treatment. Be sure to read this information carefully each time. For Prolia, talk to your pediatrician regarding the use of this medicine in children. Special care may be needed. For Xgeva, talk to your pediatrician regarding the use of this medicine in children. While this drug may be prescribed for children as young as 13 years for selected conditions, precautions do apply. Overdosage: If you think you have taken too much of this medicine contact a poison control center or emergency room at once. NOTE: This medicine is only for you. Do not share this medicine with others. What if I miss a dose? It is  important not to miss your dose. Call your doctor or health care professional if you are unable to keep an appointment. What may interact with this medicine? Do not take this medicine with any of the following medications:  other medicines containing denosumab This medicine may also interact with the following medications:  medicines that lower your chance of fighting infection  steroid medicines like prednisone or cortisone This list may not describe all possible interactions. Give your health care provider a list of all the medicines, herbs, non-prescription drugs, or dietary supplements you use. Also tell them if you smoke, drink alcohol, or use illegal drugs. Some items may interact with your medicine. What should I watch for while using this medicine? Visit your doctor or health care professional for regular checks on your progress. Your doctor or health care professional may order blood tests and other tests to see how you are doing. Call your doctor or health care professional for advice if you get a fever, chills or sore throat, or other symptoms of a cold or flu. Do not treat yourself. This drug may decrease your body's ability to fight infection. Try to avoid being around people who are sick. You should make sure you get enough calcium and vitamin D while you are taking this medicine, unless your doctor tells you not to. Discuss the foods you eat and the vitamins you take with your health care professional. See your dentist regularly. Brush and floss your teeth as directed. Before you have any dental work done, tell your dentist you are   receiving this medicine. Do not become pregnant while taking this medicine or for 5 months after stopping it. Talk with your doctor or health care professional about your birth control options while taking this medicine. Women should inform their doctor if they wish to become pregnant or think they might be pregnant. There is a potential for serious side  effects to an unborn child. Talk to your health care professional or pharmacist for more information. What side effects may I notice from receiving this medicine? Side effects that you should report to your doctor or health care professional as soon as possible:  allergic reactions like skin rash, itching or hives, swelling of the face, lips, or tongue  bone pain  breathing problems  dizziness  jaw pain, especially after dental work  redness, blistering, peeling of the skin  signs and symptoms of infection like fever or chills; cough; sore throat; pain or trouble passing urine  signs of low calcium like fast heartbeat, muscle cramps or muscle pain; pain, tingling, numbness in the hands or feet; seizures  unusual bleeding or bruising  unusually weak or tired Side effects that usually do not require medical attention (report to your doctor or health care professional if they continue or are bothersome):  constipation  diarrhea  headache  joint pain  loss of appetite  muscle pain  runny nose  tiredness  upset stomach This list may not describe all possible side effects. Call your doctor for medical advice about side effects. You may report side effects to FDA at 1-800-FDA-1088. Where should I keep my medicine? This medicine is only given in a clinic, doctor's office, or other health care setting and will not be stored at home. NOTE: This sheet is a summary. It may not cover all possible information. If you have questions about this medicine, talk to your doctor, pharmacist, or health care provider.  2020 Elsevier/Gold Standard (2017-08-15 16:10:44)

## 2020-02-19 ENCOUNTER — Ambulatory Visit: Payer: BC Managed Care – PPO | Attending: Internal Medicine

## 2020-02-19 DIAGNOSIS — Z23 Encounter for immunization: Secondary | ICD-10-CM

## 2020-02-19 NOTE — Progress Notes (Signed)
° °  Covid-19 Vaccination Clinic  Name:  Erin [name]    MRN: [id number] DOB: [birth year]  02/19/2020  Erin [name] was observed post Covid-19 immunization for 15 minutes without incident. She was provided with Vaccine Information Sheet and instruction to access the V-Safe system.   Erin [name] was instructed to call 911 with any severe reactions post vaccine:  Difficulty breathing   Swelling of face and throat   A fast heartbeat   A bad rash all over body   Dizziness and weakness

## 2020-03-31 ENCOUNTER — Encounter: Payer: BC Managed Care – PPO | Admitting: Obstetrics & Gynecology

## 2020-04-05 ENCOUNTER — Other Ambulatory Visit: Payer: Self-pay

## 2020-04-05 ENCOUNTER — Encounter: Payer: Self-pay | Admitting: Obstetrics & Gynecology

## 2020-04-05 ENCOUNTER — Ambulatory Visit (INDEPENDENT_AMBULATORY_CARE_PROVIDER_SITE_OTHER): Payer: BC Managed Care – PPO | Admitting: Obstetrics & Gynecology

## 2020-04-05 VITALS — BP 132/74 | Ht 64.25 in | Wt 155.0 lb

## 2020-04-05 DIAGNOSIS — M85851 Other specified disorders of bone density and structure, right thigh: Secondary | ICD-10-CM

## 2020-04-05 DIAGNOSIS — Z01419 Encounter for gynecological examination (general) (routine) without abnormal findings: Secondary | ICD-10-CM

## 2020-04-05 DIAGNOSIS — C50412 Malignant neoplasm of upper-outer quadrant of left female breast: Secondary | ICD-10-CM | POA: Diagnosis not present

## 2020-04-05 DIAGNOSIS — Z780 Asymptomatic menopausal state: Secondary | ICD-10-CM

## 2020-04-05 DIAGNOSIS — Z170 Estrogen receptor positive status [ER+]: Secondary | ICD-10-CM

## 2020-04-05 NOTE — Progress Notes (Signed)
Erin [name] [birth year] [id number]   History:    59 y.o. G0 Single  OE:[id number] presenting for annual gyn exam   RCV:[id number] menopause status post laparoscopic bilateral salpingo-oophorectomy November 2016. 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  MRI 12/2019 Bones normal, small fibroids on uterus.She is on Arimidex. Her bone density showed improvement of osteopenia on Prolia10/2019, repeat BD 01/2020 Osteopenia Rt Fem Neck T-Score -1.4.  No current pelvic pain. No postmenopausal bleeding. Abstinent currently. Very mild menopausal symptoms. Urine and bowel movements normal.BMI 26.40. Physically active.  Past medical history,surgical history, family history and social history were all reviewed and documented in the EPIC chart.  Gynecologic History No LMP recorded (lmp unknown). Patient is postmenopausal.  Obstetric History OB History  Gravida Para Term Preterm AB Living  0 0 0 0 0 0  SAB IAB Ectopic Multiple Live Births  0 0 0 0 0     ROS: A ROS was performed and pertinent positives and negatives are included in the history.  GENERAL: No fevers or chills. HEENT: No change in vision, no earache, sore throat or sinus congestion. NECK: No pain or stiffness. CARDIOVASCULAR: No chest pain or pressure. No palpitations. PULMONARY: No shortness of breath, cough or wheeze. GASTROINTESTINAL: No abdominal pain, nausea, vomiting or diarrhea, melena or bright red blood per rectum. GENITOURINARY: No urinary frequency, urgency, hesitancy or dysuria. MUSCULOSKELETAL: No joint or muscle  pain, no back pain, no recent trauma. DERMATOLOGIC: No rash, no itching, no lesions. ENDOCRINE: No polyuria, polydipsia, no heat or cold intolerance. No recent change in weight. HEMATOLOGICAL: No anemia or easy bruising or bleeding. NEUROLOGIC: No headache, seizures, numbness, tingling or weakness. PSYCHIATRIC: No depression, no loss of interest in normal activity or change in sleep pattern.     Exam:   BP 132/74   Ht 5' 4.25" (1.632 m)   Wt 155 lb (70.3 kg)   LMP  (LMP Unknown)   BMI 26.40 kg/m   Body mass index is 26.4 kg/m.  General appearance : Well developed well nourished female. No acute distress HEENT: Eyes: no retinal hemorrhage or exudates,  Neck supple, trachea midline, no carotid bruits, no thyroidmegaly Lungs: Clear to auscultation, no rhonchi or wheezes, or rib retractions  Heart: Regular rate and rhythm, no murmurs or gallops Breast:Examined in sitting and supine position were s/p bilateral mastectomy, no palpable masses or tenderness,  no skin retraction, no skin discoloration, no axillary or supraclavicular lymphadenopathy Abdomen: no palpable masses or tenderness, no rebound or guarding Extremities: no edema or skin discoloration or tenderness  Pelvic: Vulva: Normal             Vagina: No gross lesions or discharge  Cervix: No gross lesions or discharge.  Pap reflex done.  Uterus  AV, normal size, shape and consistency, non-tender and mobile  Adnexa  Without masses or tenderness  Anus: Normal   Assessment/Plan:  59 y.o. female for annual exam   1. Encounter for routine gynecological examination with Papanicolaou smear of cervix Normal gynecologic exam s/p BSO.  Pap reflex done.  Breast exam s/p Bilateral mastectomy.  H/O recurrent Lt Breast Ca on Tamoxifen followed by Dr [name].  Health labs with Fam MD.  2. Postmenopausal Well on no HRT.  3. Osteopenia of neck of right femur BD 01/2020 Osteopenia T-Score -1.4 at Rt Femoral Neck.  Vit D supplements/Ca++  total of 1500 mg daily and regular weight bearing physical activities.  4. Malignant neoplasm of upper-outer quadrant of left breast in female, estrogen receptor positive (HCC) Recurrent Lt Breast Ca on Arimidex.  MRI 01/2020 small uterine fibroids, patient reassured.  Other orders - Olopatadine HCl (PATADAY) 0.7 % SOLN; Apply to eye.  Princess Bruins MD, 4:14 PM 04/05/2020

## 2020-04-07 LAB — PAP IG W/ RFLX HPV ASCU

## 2020-04-10 ENCOUNTER — Encounter: Payer: Self-pay | Admitting: Obstetrics & Gynecology

## 2020-05-19 ENCOUNTER — Other Ambulatory Visit: Payer: Self-pay

## 2020-05-19 ENCOUNTER — Ambulatory Visit (INDEPENDENT_AMBULATORY_CARE_PROVIDER_SITE_OTHER): Payer: BC Managed Care – PPO | Admitting: Internal Medicine

## 2020-05-19 ENCOUNTER — Encounter: Payer: Self-pay | Admitting: Internal Medicine

## 2020-05-19 VITALS — BP 118/90 | HR 86 | Temp 98.9°F | Resp 18 | Ht 64.25 in | Wt 159.0 lb

## 2020-05-19 DIAGNOSIS — I10 Essential (primary) hypertension: Secondary | ICD-10-CM | POA: Diagnosis not present

## 2020-05-19 DIAGNOSIS — Z0000 Encounter for general adult medical examination without abnormal findings: Secondary | ICD-10-CM | POA: Diagnosis not present

## 2020-05-19 DIAGNOSIS — E038 Other specified hypothyroidism: Secondary | ICD-10-CM | POA: Diagnosis not present

## 2020-05-19 DIAGNOSIS — R197 Diarrhea, unspecified: Secondary | ICD-10-CM

## 2020-05-19 MED ORDER — ACYCLOVIR 5 % EX OINT
1.0000 | TOPICAL_OINTMENT | CUTANEOUS | 5 refills | Status: DC
Start: 2020-05-19 — End: 2022-06-28

## 2020-05-19 MED ORDER — CICLOPIROX 1 % EX SHAM: MEDICATED_SHAMPOO | CUTANEOUS | 0 refills | Status: DC

## 2020-05-19 MED ORDER — LEVOTHYROXINE SODIUM 75 MCG PO TABS: 75.0000 ug | ORAL_TABLET | Freq: Every day | ORAL | 3 refills | Status: DC

## 2020-05-19 MED ORDER — LOSARTAN POTASSIUM 100 MG PO TABS
100.0000 mg | ORAL_TABLET | Freq: Every day | ORAL | 3 refills | Status: DC
Start: 2020-05-19 — End: 2021-05-18

## 2020-05-19 NOTE — Assessment & Plan Note (Signed)
Checking TSH and free T4 and adjust synthroid as needed.

## 2020-05-19 NOTE — Assessment & Plan Note (Signed)
Checking CMP and adjust losartan as needed.  

## 2020-05-19 NOTE — Assessment & Plan Note (Signed)
Flu shot up to date. Covid-19 up to date including booster. Shingrix complete. Tetanus up to date. Cologuard due 2023. Mammogram up to date, pap smear up to date and dexa up to date. Counseled about sun safety and mole surveillance. Counseled about the dangers of distracted driving. Given 10 year screening recommendations.

## 2020-05-19 NOTE — Assessment & Plan Note (Signed)
Recent C dif and now not present. Some bowel changes since that time and we discussed different strategies including food diary to help.

## 2020-05-19 NOTE — Patient Instructions (Addendum)
We have sent in the shampoo ciclopirox to use daily for 1 month.  If it works you can switch to selsun blue after that this is fine.   Health Maintenance, Female Adopting a healthy lifestyle and getting preventive care are important in promoting health and wellness. Ask your health care provider about:  The right schedule for you to have regular tests and exams.  Things you can do on your own to prevent diseases and keep yourself healthy. What should I know about diet, weight, and exercise? Eat a healthy diet  Eat a diet that includes plenty of vegetables, fruits, low-fat dairy products, and lean protein.  Do not eat a lot of foods that are high in solid fats, added sugars, or sodium.   Maintain a healthy weight Body mass index (BMI) is used to identify weight problems. It estimates body fat based on height and weight. Your health care provider can help determine your BMI and help you achieve or maintain a healthy weight. Get regular exercise Get regular exercise. This is one of the most important things you can do for your health. Most adults should:  Exercise for at least 150 minutes each week. The exercise should increase your heart rate and make you sweat (moderate-intensity exercise).  Do strengthening exercises at least twice a week. This is in addition to the moderate-intensity exercise.  Spend less time sitting. Even light physical activity can be beneficial. Watch cholesterol and blood lipids Have your blood tested for lipids and cholesterol at 59 years of age, then have this test every 5 years. Have your cholesterol levels checked more often if:  Your lipid or cholesterol levels are high.  You are older than 59 years of age.  You are at high risk for heart disease. What should I know about cancer screening? Depending on your health history and family history, you may need to have cancer screening at various ages. This may include screening for:  Breast  cancer.  Cervical cancer.  Colorectal cancer.  Skin cancer.  Lung cancer. What should I know about heart disease, diabetes, and high blood pressure? Blood pressure and heart disease  High blood pressure causes heart disease and increases the risk of stroke. This is more likely to develop in people who have high blood pressure readings, are of African descent, or are overweight.  Have your blood pressure checked: ? Every 3-5 years if you are 47-26 years of age. ? Every year if you are 17 years old or older. Diabetes Have regular diabetes screenings. This checks your fasting blood sugar level. Have the screening done:  Once every three years after age 5 if you are at a normal weight and have a low risk for diabetes.  More often and at a younger age if you are overweight or have a high risk for diabetes. What should I know about preventing infection? Hepatitis B If you have a higher risk for hepatitis B, you should be screened for this virus. Talk with your health care provider to find out if you are at risk for hepatitis B infection. Hepatitis C Testing is recommended for:  Everyone born from [birth year] through 1964.  Anyone with known risk factors for hepatitis C. Sexually transmitted infections (STIs)  Get screened for STIs, including gonorrhea and chlamydia, if: ? You are sexually active and are younger than 59 years of age. ? You are older than 59 years of age and your health care provider tells you that you are at risk for  this type of infection. ? Your sexual activity has changed since you were last screened, and you are at increased risk for chlamydia or gonorrhea. Ask your health care provider if you are at risk.  Ask your health care provider about whether you are at high risk for HIV. Your health care provider may recommend a prescription medicine to help prevent HIV infection. If you choose to take medicine to prevent HIV, you should first get tested for HIV. You should  then be tested every 3 months for as long as you are taking the medicine. Pregnancy  If you are about to stop having your period (premenopausal) and you may become pregnant, seek counseling before you get pregnant.  Take 400 to 800 micrograms (mcg) of folic acid every day if you become pregnant.  Ask for birth control (contraception) if you want to prevent pregnancy. Osteoporosis and menopause Osteoporosis is a disease in which the bones lose minerals and strength with aging. This can result in bone fractures. If you are 74 years old or older, or if you are at risk for osteoporosis and fractures, ask your health care provider if you should:  Be screened for bone loss.  Take a calcium or vitamin D supplement to lower your risk of fractures.  Be given hormone replacement therapy (HRT) to treat symptoms of menopause. Follow these instructions at home: Lifestyle  Do not use any products that contain nicotine or tobacco, such as cigarettes, e-cigarettes, and chewing tobacco. If you need help quitting, ask your health care provider.  Do not use street drugs.  Do not share needles.  Ask your health care provider for help if you need support or information about quitting drugs. Alcohol use  Do not drink alcohol if: ? Your health care provider tells you not to drink. ? You are pregnant, may be pregnant, or are planning to become pregnant.  If you drink alcohol: ? Limit how much you use to 0-1 drink a day. ? Limit intake if you are breastfeeding.  Be aware of how much alcohol is in your drink. In the [place] one drink equals one 12 oz bottle of beer (355 mL), one 5 oz glass of wine (148 mL), or one 1 oz glass of hard liquor (44 mL). General instructions  Schedule regular health, dental, and eye exams.  Stay current with your vaccines.  Tell your health care provider if: ? You often feel depressed. ? You have ever been abused or do not feel safe at home. Summary  Adopting a  healthy lifestyle and getting preventive care are important in promoting health and wellness.  Follow your health care provider's instructions about healthy diet, exercising, and getting tested or screened for diseases.  Follow your health care provider's instructions on monitoring your cholesterol and blood pressure. This information is not intended to replace advice given to you by your health care provider. Make sure you discuss any questions you have with your health care provider. Document Revised: 04/01/2018 Document Reviewed: 04/01/2018 Elsevier Patient Education  2021 [employer].

## 2020-05-19 NOTE — Progress Notes (Signed)
   Subjective:   Patient ID: Erin [name], female    DOB: 1961-04-18, 60 y.o.   MRN: [id number]  HPI The patient is a 60 YO female coming in for physical with several questions.   PMH, [facility], social history reviewed and updated.  Review of Systems  Constitutional: Negative.   HENT: Negative.   Eyes: Negative.   Respiratory: Negative for cough, chest tightness and shortness of breath.   Cardiovascular: Negative for chest pain, palpitations and leg swelling.  Gastrointestinal: Negative for abdominal distention, abdominal pain, constipation, diarrhea, nausea and vomiting.       Bowel changes  Musculoskeletal: Negative.   Skin: Positive for rash.  Neurological: Negative.   Psychiatric/Behavioral: Negative.     Objective:  Physical Exam Constitutional:      Appearance: She is well-developed and well-nourished.  HENT:     Head: Normocephalic and atraumatic.     Comments: Scalp rash Eyes:     Extraocular Movements: EOM normal.  Cardiovascular:     Rate and Rhythm: Normal rate and regular rhythm.  Pulmonary:     Effort: Pulmonary effort is normal. No respiratory distress.     Breath sounds: Normal breath sounds. No wheezing or rales.  Abdominal:     General: Bowel sounds are normal. There is no distension.     Palpations: Abdomen is soft.     Tenderness: There is no abdominal tenderness. There is no rebound.  Musculoskeletal:        General: No edema.     Cervical back: Normal range of motion.  Skin:    General: Skin is warm and dry.  Neurological:     Mental Status: She is alert and oriented to person, place, and time.     Coordination: Coordination normal.  Psychiatric:        Mood and Affect: Mood and affect normal.     Vitals:   05/19/20 1413  BP: 118/90  Pulse: 86  Resp: 18  Temp: 98.9 F (37.2 C)  TempSrc: Oral  SpO2: 98%  Weight: 159 lb (72.1 kg)  Height: 5' 4.25" (1.632 m)    This visit occurred during the SARS-CoV-2 public health emergency.  Safety  protocols were in place, including screening questions prior to the visit, additional usage of staff PPE, and extensive cleaning of exam room while observing appropriate contact time as indicated for disinfecting solutions.   Assessment & Plan:

## 2020-05-24 ENCOUNTER — Other Ambulatory Visit (INDEPENDENT_AMBULATORY_CARE_PROVIDER_SITE_OTHER): Payer: BC Managed Care – PPO

## 2020-05-24 DIAGNOSIS — I10 Essential (primary) hypertension: Secondary | ICD-10-CM

## 2020-05-24 DIAGNOSIS — E038 Other specified hypothyroidism: Secondary | ICD-10-CM | POA: Diagnosis not present

## 2020-05-24 LAB — LIPID PANEL
Cholesterol: 133 mg/dL (ref 0–200)
HDL: 50.7 mg/dL (ref >39.00)
LDL Cholesterol: 67 mg/dL (ref 0–99)
NonHDL: 82.66
Total CHOL/HDL Ratio: 3
Triglycerides: 76 mg/dL (ref 0.0–149.0)
VLDL: 15.2 mg/dL (ref 0.0–40.0)

## 2020-05-24 LAB — CBC
HCT: 42.6 % (ref 36.0–46.0)
Hemoglobin: 14.8 g/dL (ref 12.0–15.0)
MCHC: 34.7 g/dL (ref 30.0–36.0)
MCV: 87 fl (ref 78.0–100.0)
Platelets: 215 10*3/uL (ref 150.0–400.0)
RBC: 4.9 Mil/uL (ref 3.87–5.11)
RDW: 12.9 % (ref 11.5–15.5)
WBC: 4.6 10*3/uL (ref 4.0–10.5)

## 2020-05-24 LAB — COMPREHENSIVE METABOLIC PANEL
ALT: 9 U/L (ref 0–35)
AST: 14 U/L (ref 0–37)
Albumin: 4 g/dL (ref 3.5–5.2)
Alkaline Phosphatase: 56 U/L (ref 39–117)
BUN: 16 mg/dL (ref 6–23)
CO2: 31 mEq/L (ref 19–32)
Calcium: 9.1 mg/dL (ref 8.4–10.5)
Chloride: 102 mEq/L (ref 96–112)
Creatinine, Ser: 0.87 mg/dL (ref 0.40–1.20)
GFR: 73.39 mL/min (ref >60.00)
Glucose, Bld: 75 mg/dL (ref 70–99)
Potassium: 4.2 mEq/L (ref 3.5–5.1)
Sodium: 137 mEq/L (ref 135–145)
Total Bilirubin: 1.3 mg/dL — ABNORMAL HIGH (ref 0.2–1.2)
Total Protein: 6.8 g/dL (ref 6.0–8.3)

## 2020-05-24 LAB — TSH: TSH: 2.64 u[IU]/mL (ref 0.35–4.50)

## 2020-05-24 LAB — T4, FREE: Free T4: 1.24 ng/dL (ref 0.60–1.60)

## 2020-05-24 LAB — VITAMIN D 25 HYDROXY (VIT D DEFICIENCY, FRACTURES): VITD: 34.31 ng/mL (ref 30.00–100.00)

## 2020-08-02 ENCOUNTER — Other Ambulatory Visit: Payer: Self-pay

## 2020-08-02 DIAGNOSIS — C50412 Malignant neoplasm of upper-outer quadrant of left female breast: Secondary | ICD-10-CM

## 2020-08-02 NOTE — [facility] Notes (Signed)
[place]  [phone] [phone]   ID: Erin [name] DOB: 11-30-1960 MR#: [id number] CSN#: [id number]     Patient Care Team: Erin Koch, MD as PCP - General (Internal Medicine) Erin [name], Erin Dad, MD as Consulting Physician (Oncology) Erin Bookbinder, MD as Consulting Physician (General Surgery) Erin Bruins, MD as Consulting Physician (Obstetrics and Gynecology) Erin [name], Erin Lines, MD as Consulting Physician (Gastroenterology) Erin Age, MD as Attending Physician (Neurology) Erin [name], DDS as Referring Physician (Dentistry) Erin Pulley, DO as Consulting Physician (Family Medicine) Erin Pigg, MD as Consulting Physician (Dermatology) Erin Gibson, MD as Attending Physician (Radiation Oncology) Erin [name], DDS as Referring Physician (Dentistry) OTHER MD: Erin [name]  CHIEF COMPLAINT: Recurrent breast cancer (s/p bilateral mastectomies)  CURRENT THERAPY: anastrozole; denosumab/Prolia   INTERVAL HISTORY: Erin [name] returns today for follow-up of her recurrent breast cancer.   She continues on anastrozole, with good tolerance.  Hot flashes and vaginal dryness are not major concerns.  Her most bone density screening on 02/11/2020 showed a T-score of -1.4, which is considered osteopenic.  Recall she took [place] previously, until May 2019.  She is now ready to start on Prolia.  This will be her first dose.    Since her last visit, she has not undergone any additional studies.   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  This is a city where she lived previously and has many connections in addition to of course the conference she is going to.  She continues to exercise regularly.  She denies any unusual headaches visual changes cough and production pleurisy shortness of breath change in bowel or bladder habits, pain, rash bleeding fever or drenching sweats.   A detailed review of systems today was otherwise stable.   COVID 19 VACCINATION STATUS: fully vaccinated [employer]), with booster 01/2020   BREAST CANCER HISTORY From the original intake note 04/07/2011:  Erin [name] had routine screening mammography December of 2009 at the [facility] in [place], [place], showing an increasing asymmetry in her left breast. She was recalled for additional views December 29. She was noted to have heterogeneously dense breasts. It was a 5 cm area of architectural distortion in the lateral aspect of the left breast with no associated calcifications. There was a second lesion medial to this. Ultrasound showed a highly suspicious hypoechoic irregularly marginated mass measuring 3 cm at the 2:30 position 5 cm from the nipple. This was palpable to the mammographer. The second area in question I measured 7 mm and a third lesion was noted measuring 5 mm. Some left axillary lymph nodes were morphologically normal.  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 Bilateral breast MRIs were performed 05/04/2008 and showed, in the left breast, a large lobulated mass measuring up to 4.3 cm, and including both of the apparently separate masses the previously biopsied. In the right breast there were 2 indeterminate lesions. These were evaluated further with breast specific gamma imaging performed January 14 in both lesions were negative. The lesion in the left breast was markedly abnormal.  Given this complex history and with the background of significant breast density, the patient opted for bilateral mastectomies with left sentinel lymph node sampling. This was performed of 06/27/2008 and showed([id number]. [id number]) in the right breast, no malignancy. In  the left breast there was a 3.2 cm invasive ductal carcinoma, grade 1, focally  extending to the anterior margin of the lower outer quadrant. There was extensive angiolymphatic invasion, but both sentinel lymph nodes on the left were negative.  The patient received left-sided postmastectomy radiation including of the left chest wall and left supraclavicular fossa to a total dose of 50.4 Gy plus a 10 Gy scar boost. She had an Oncotype DX recurrence score of 17, further discussed below. She decided to forego reconstruction. She was tested for BRCA1 and 2 and was found to be negative. Given her overall prognosis, she did not receive chemotherapy, but started [name] February of 2010, with good tolerance.  RECURRENT DISEASE:  SUMMARY OF PRESENTATION  Erin [name] for 5 years with no evidence of active disease. In May 2015 we decided to stop the [name] because of problems with endometrial hyperplasia. 6 months later, at the November visit, she was found to have a change in a small area of scaring her left inframammary fold. She was referred to dermatology and biopsy 04/21/2014 showed ([id number]) recurrent ductal adenocarcinoma (positive for gross cystic disease fluid protein, estrogen, and progesterone). She then underwent a staging PET scan in [facility] 05/06/2014. This showed a 1.9 cm sclerotic lesion in the right ischial tuberosity, with a maximum standard uptake value of 4.6. A sclerotic lesion in the left side of the L3 vertebral body measuring 1.6 cm had no hypermetabolic activity.  Her subsequent history is as detailed below.   PAST MEDICAL HISTORY: Past Medical History:  Diagnosis Date  . Allergic rhinitis due to pollen   . Breast cancer ([facility]) 2010  . Breast cancer ([facility] [facility]) 2016   reoccurrence in scar  . History of radiation therapy 04/26/19- 05/05/19   Right ischium 5 fractions of 10 Gy each to total 50 Gy.   Marland Kitchen Hypertension   . Hypothyroidism   . Osteopenia   . S/P radiation therapy 07/12/2014 through  08/16/2014    Left chest wall 4000 cGy in 20 sessions, left chest wall boost 1000 cGy in 5 sessions  . Wears glasses   Diverticulosis     PAST SURGICAL HISTORY: Past Surgical History:  Procedure Laterality Date  . BREAST LUMPECTOMY Left 06/01/2014   Procedure: WIDE LOCAL EXCISION OF RECURRENT LEFT BREAST CANCER;  Surgeon: Erin Bookbinder, MD;  Location: [facility];  Service: General;  Laterality: Left;  . COLONOSCOPY    . cyst removed     left lower jaw - at [name] 89 yr  . DILATATION & CURRETTAGE/HYSTEROSCOPY WITH RESECTOCOPE N/A 09/16/2013   Procedure: DILATATION & CURETTAGE/HYSTEROSCOPY WITH RESECTOCOPE;  Surgeon: Erin Bruins, MD;  Location: [facility];  Service: Gynecology;  Laterality: N/A;  1 hr.  . INGUINAL HERNIA REPAIR     right side as infant  . LAPAROSCOPIC PELVIC LYMPH NODE BIOPSY    . MASTECTOMY  2010   bilateral mastectomies-lt snbx-radiation post  . MASTECTOMY Bilateral   . OOPHORECTOMY  2016   patient denies  . Right knee arthroscopy  2008  . ROBOTIC ASSISTED BILATERAL SALPINGO OOPHERECTOMY Bilateral 03/13/2015   Procedure: ROBOTIC ASSISTED BILATERAL SALPINGO OOPHORECTOMY With Washings;  Surgeon: Erin Bruins, MD;  Location: [facility];  Service: Gynecology;  Laterality: Bilateral;  . TONSILLECTOMY    . WISDOM TOOTH EXTRACTION      FAMILY HISTORY Family History  Problem Relation [name] of Onset  . Hypertension Mother   . Cancer Mother        Lung  Cancer - long history of tobacco use  . Hypertension Father   . Stroke Maternal Grandfather   . Stroke Paternal Grandfather   . Hypertension Brother   . Colon cancer Neg Hx   . Stomach cancer Neg Hx   The patient's mother died in 6789 from complications of lung cancer. The patient has not been in touch with her father her for approximately 30 years. She had no sisters, 1 brother, who is in good health. There is no breast or ovarian cancer in the family  to her knowledge.   GYNECOLOGIC HISTORY:  GX P0, menarche at around [name] 44, the patient continued to have periods despite being on [name], although more irregularly. She is now postmenopausal (status post BSO).   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. She lives alone and has no pets. Her work involves quite a bit of travel, though this has been interrupted by the pandemic.Marland Kitchen   ADVANCED DIRECTIVES:  Eleonore Chiquito is HCPOA--she can be reached at [phone]   HEALTH MAINTENANCE:     Social History   Tobacco Use  . Smoking status: Never Smoker  . Smokeless tobacco: Never Used  Vaping Use  . Vaping Use: Never used  Substance Use Topics  . Alcohol use: No    Alcohol/week: 0.0 standard drinks    Comment: rare  . Drug use: No      Colonoscopy: December 2013 Coll[facility] Mares)  PAP: Feb 2011  Bone density: 06/2009, normal  Cholesterol: "good"    Allergies  Allergen Reactions  . Pollen Extract   . Adhesive [Tape] Dermatitis and Rash    Paper tape is better to use    MEDICATIONS:   Current Outpatient Medications  Medication Sig Dispense Refill  . acetaminophen (TYLENOL) 500 MG tablet Take 500 mg by mouth every 6 (six) hours as needed for moderate pain or headache.    Marland Kitchen acyclovir ointment (ZOVIRAX) 5 % Apply 1 application topically every 3 (three) hours. As needed 5 g 5  . anastrozole (ARIMIDEX) 1 MG tablet TAKE 1 TABLET(1 MG) BY MOUTH DAILY 90 tablet 4  . Cholecalciferol (VITAMIN D3) 2000 UNITS capsule Take 2,000 Units by mouth daily.    . Ciclopirox 1 % shampoo Use topically on scalp 120 mL 0  . fexofenadine (ALLEGRA) 180 MG tablet Take 180 mg by mouth daily as needed for allergies or rhinitis.    . fluocinolone (SYNALAR) 0.01 % external solution Apply 1 application topically 2 (two) times daily as needed (irritation).     Marland Kitchen ibuprofen (ADVIL,MOTRIN) 200 MG tablet Take 200 mg by mouth every 6 (six) hours as needed for moderate pain.    Marland Kitchen ketoconazole (NIZORAL) 2 % shampoo  Apply 1 application topically 2 (two) times a week.    . levothyroxine (SYNTHROID) 75 MCG tablet Take 1 tablet (75 mcg total) by mouth daily before breakfast. 90 tablet 3  . losartan (COZAAR) 100 MG tablet Take 1 tablet (100 mg total) by mouth daily. 90 tablet 3  . Olopatadine HCl (PATADAY) 0.7 % SOLN Apply to eye.    Marland Kitchen Olopatadine HCl 0.6 % SOLN Place 2 sprays into both nostrils 2 (two) times daily as needed (allergies).     Vladimir Faster Glycol-Propyl Glycol (SYSTANE OP) Place 1 drop into both eyes daily as needed (for allergies).     . Turmeric 500 MG CAPS Take 500 mg by mouth daily.      No current facility-administered medications for this visit.    OBJECTIVE:  white woman who appears younger than stated [name]    16:   08/03/20 1436  BP: 130/77  Pulse: 76  Resp: 16  Temp: 98.2 F (36.8 C)  SpO2: 99%     Body mass index is 27.47 kg/m.   ECOG FS: 1 - Symptomatic but completely ambulatory  Sclerae unicteric, EOMs intact Wearing a mask No cervical or supraclavicular adenopathy Lungs no rales or rhonchi Heart regular rate and rhythm Abd soft, nontender, positive bowel sounds MSK no focal spinal tenderness, no upper extremity lymphedema Neuro: nonfocal, well oriented, appropriate affect Breasts: Status post bilateral mastectomies and status post radiation to the left chest wall.  There is no evidence of chest wall activity.  Both axillae are benign.   LABS:           Chemistry      Component Value Date/Time   NA 136 08/03/2020 1341   NA 139 02/28/2017 1521   K 4.3 08/03/2020 1341   K 4.6 02/28/2017 1521   CL 102 08/03/2020 1341   CL 105 08/10/2012 0944   CO2 27 08/03/2020 1341   CO2 29 02/28/2017 1521   BUN 21 (H) 08/03/2020 1341   BUN 20.0 02/28/2017 1521   CREATININE 0.87 08/03/2020 1341   CREATININE 1.0 02/28/2017 1521      Component Value Date/Time   CALCIUM 9.6 08/03/2020 1341   CALCIUM 10.4 02/28/2017 1521   ALKPHOS 70 08/03/2020 1341   ALKPHOS 62  02/28/2017 1521   AST 17 08/03/2020 1341   AST 18 02/28/2017 1521   ALT 11 08/03/2020 1341   ALT 13 02/28/2017 1521   BILITOT 1.1 08/03/2020 1341   BILITOT 1.17 02/28/2017 1521         Lab Results  Component Value Date   WBC 6.2 08/03/2020   HGB 14.3 08/03/2020   HCT 42.3 08/03/2020   MCV 87.2 08/03/2020   PLT 209 08/03/2020   NEUTROABS 4.2 08/03/2020    STUDIES  No results found.   ASSESSMENT: 60 y.o.  BRCA negative [place] woman ,  (1) status post bilateral mastectomies January 2010 for a left-sided upper outer quadrant T2 N0 (stage IIA) invasive ductal carcinoma, grade 1, strongly estrogen and progesterone receptor positive, HER-2 nonamplified,   (2) Oncotype DX recurrence score of 17, predicting a 10-12% distant recurrence risk over 10 years if her only adjuvant treatment is [name] for 5 years  (3) status post Left postmastectomy radiation including Left supraclavicular fossa (5040 cGy in 28 sessions)  (4) on [name] starting February of 2010, stopped May 2015 because of endometrial hyperplasia  (5) osteopenia-- T score -1.2 on repeated bone density scans 09/24/2011 and 12/12/2013  (a) bone density 01/30/2018 showed a T score of +0.1  (b) bone density 02/11/2020 showed a T score of -0.4   RECURRENT DISEASE (6) skin biopsy of a lesion under the left breast scar 04/21/2014 shows adenocarcinoma, gross cystic disease fluid protein, estrogen and progesterone receptor positive  (a) s/p wide excision with negative though close margins 06/01/2014, prognostic panel pending  (b) radiation to Left chest wall 4000 cGy in 20 sessions, left schest wall boost 1000 cGy in 5 session 07/12/2014 through 08/16/2014  (7) PET scan 05/06/2014 and bone scan show only one additional area of concern, in the right ischiial tuberosity--  (a) biopsy of the right ischial tuberosity 05/24/2014 showed no evidence of malignancy  (b) zolendronate started 06/06/2014, poorly  tolerated  (c) denosumab/ Xgeva started 09/26/2014, switched to Prolia as of May 2017  (  d) repeat bone scan 12/20/2014 shows no new/additional areas of disease  (e) restaging studies 07/07/2015 do not support a diagnosis of metastatic disease.  (f) MRI of the pelvis 04/13/2016 and 03/19/2018 shows the lesion in the right ischial tuberosity to be unchanged  (g) pelvic MRI 02/12/2019 shows slight growth and possible increased activity of the ischial lesion  (h) PET scan 02/22/2019 shows no other areas of uptake.  The right ischial lesion area has possibly slightly progressed as compared to prior  (i) repeat biopsy of the right ischial lesion shows very minute clusters of atypical epithelioid cells highlighted by cytokeratin AE1/AE3 and 8/18, GA TA 3+, with no gross cystic disease fluid protein positivity, and estrogen and progesterone receptor negative: this is not metastatic breast cancer  (8) goserelin monthly started 05/09/2016, last dose 02/20/2015  (a) s/p BSO 03/13/2015 with benign pathology  (9) anastrozole started  08/29/2014  (a) bone density 12/13/2015 shows improvement, with the lumbar spine T score now at 0.1, the femoral neck -1.1, both increased  (b) continues on denosumab/Prolia most recent dose 09/05/2017  (c) repeat bone density 01/30/2018 shows a T score of 0.1  (d) denosumab/Prolia discontinued after 09/05/2017 dose  (10) status post  SBRT treatment /07/2019 through 05/05/2019 to the right ischial tuberosity lesion  (a) no uptake at the area in question on bone scan 10/21/2019  (b) MRI of the pelvis 02/15/2021 shows significant improvement in the right ischial tuberosity lesion  (11) denosumab/Xgeva resumed 05/13/2019, repeated every 12 weeks  (a) discontinued after 02/16/2020 dose  (b) denosumab/Prolia started April 2022    PLAN: Erin [name] is now more than 12 years out from initial diagnosis of breast cancer.  There is no evidence of disease activity.  This is very  favorable.  She continues on anastrozole generally with good tolerance and the plan is to continue that a total of 7 years.  That means she will stop anastrozole in May 2023.  At that point she will have the option for observation alone versus possibly considering raloxifene for bone health.  She understands after 7 years of anastrozole it is not clear that any further antiestrogen treatment would further reduce the risk of cancer recurrence.  We discussed whether or not to do a PET scan.  The decision we agreed was that we will obtain a PET scan only when she finally goes off all systemic treatment.  Also we will continue to do a yearly pelvic MRI until there is no further change in that area at which point we will stop.  She will likely graduate from follow-up at that point  She will see me again in 6 months with her next Prolia dose and we will obtain a pelvic MRI prior to that visit.  Total encounter time 25 minutes.*  Ifeoluwa Bartz, Erin Dad, MD  08/03/20 7:04 PM Medical Oncology and Hematology [facility] [address], [place] [address] Tel. [phone]    Fax. [phone]    I, Wilburn Mylar, am acting as scribe for Dr. Virgie [name]. Samanvitha Germany.  I, Lurline Del MD, have reviewed the above documentation for accuracy and completeness, and I agree with the above.   *Total Encounter Time as defined by the Centers for Medicare and Medicaid Services includes, in addition to the face-to-face time of a patient visit (documented in the note above) non-face-to-face time: obtaining and reviewing outside history, ordering and reviewing medications, tests or procedures, care coordination (communications with other health care professionals or caregivers) and documentation  in the medical record.

## 2020-08-03 ENCOUNTER — Inpatient Hospital Stay: Payer: BC Managed Care – PPO

## 2020-08-03 ENCOUNTER — Other Ambulatory Visit: Payer: Self-pay

## 2020-08-03 ENCOUNTER — Inpatient Hospital Stay: Payer: BC Managed Care – PPO | Attending: Oncology | Admitting: Oncology

## 2020-08-03 VITALS — BP 130/77 | HR 76 | Temp 98.2°F | Resp 16 | Ht 64.25 in | Wt 161.3 lb

## 2020-08-03 DIAGNOSIS — C50412 Malignant neoplasm of upper-outer quadrant of left female breast: Secondary | ICD-10-CM

## 2020-08-03 DIAGNOSIS — N8500 Endometrial hyperplasia, unspecified: Secondary | ICD-10-CM

## 2020-08-03 DIAGNOSIS — C50912 Malignant neoplasm of unspecified site of left female breast: Secondary | ICD-10-CM | POA: Diagnosis not present

## 2020-08-03 DIAGNOSIS — Z853 Personal history of malignant neoplasm of breast: Secondary | ICD-10-CM | POA: Insufficient documentation

## 2020-08-03 DIAGNOSIS — Z170 Estrogen receptor positive status [ER+]: Secondary | ICD-10-CM | POA: Diagnosis not present

## 2020-08-03 DIAGNOSIS — M8580 Other specified disorders of bone density and structure, unspecified site: Secondary | ICD-10-CM | POA: Diagnosis not present

## 2020-08-03 LAB — CMP (CANCER CENTER ONLY)
ALT: 11 U/L (ref 0–44)
AST: 17 U/L (ref 15–41)
Albumin: 3.8 g/dL (ref 3.5–5.0)
Alkaline Phosphatase: 70 U/L (ref 38–126)
Anion gap: 7 (ref 5–15)
BUN: 21 mg/dL — ABNORMAL HIGH (ref 6–20)
CO2: 27 mmol/L (ref 22–32)
Calcium: 9.6 mg/dL (ref 8.9–10.3)
Chloride: 102 mmol/L (ref 98–111)
Creatinine: 0.87 mg/dL (ref 0.44–1.00)
GFR, Estimated: >60 mL/min (ref >60)
Glucose, Bld: 90 mg/dL (ref 70–99)
Potassium: 4.3 mmol/L (ref 3.5–5.1)
Sodium: 136 mmol/L (ref 135–145)
Total Bilirubin: 1.1 mg/dL (ref 0.3–1.2)
Total Protein: 6.7 g/dL (ref 6.5–8.1)

## 2020-08-03 LAB — CBC WITH DIFFERENTIAL (CANCER CENTER ONLY)
Abs Immature Granulocytes: 0.01 10*3/uL (ref 0.00–0.07)
Basophils Absolute: 0.1 10*3/uL (ref 0.0–0.1)
Basophils Relative: 1 %
Eosinophils Absolute: 0.1 10*3/uL (ref 0.0–0.5)
Eosinophils Relative: 1 %
HCT: 42.3 % (ref 36.0–46.0)
Hemoglobin: 14.3 g/dL (ref 12.0–15.0)
Immature Granulocytes: 0 %
Lymphocytes Relative: 22 %
Lymphs Abs: 1.4 10*3/uL (ref 0.7–4.0)
MCH: 29.5 pg (ref 26.0–34.0)
MCHC: 33.8 g/dL (ref 30.0–36.0)
MCV: 87.2 fL (ref 80.0–100.0)
Monocytes Absolute: 0.5 10*3/uL (ref 0.1–1.0)
Monocytes Relative: 9 %
Neutro Abs: 4.2 10*3/uL (ref 1.7–7.7)
Neutrophils Relative %: 67 %
Platelet Count: 209 10*3/uL (ref 150–400)
RBC: 4.85 MIL/uL (ref 3.87–5.11)
RDW: 11.7 % (ref 11.5–15.5)
WBC Count: 6.2 10*3/uL (ref 4.0–10.5)
nRBC: 0 % (ref 0.0–0.2)

## 2020-08-03 MED ORDER — DENOSUMAB 60 MG/ML ~~LOC~~ SOSY
60.0000 mg | PREFILLED_SYRINGE | Freq: Once | SUBCUTANEOUS | Status: AC
  Administered 2020-08-03: 60 mg via SUBCUTANEOUS

## 2020-08-03 MED ORDER — ANASTROZOLE 1 MG PO TABS: 1.0000 mg | ORAL_TABLET | Freq: Every day | ORAL | 4 refills | Status: DC

## 2020-08-03 MED ORDER — DENOSUMAB 60 MG/ML ~~LOC~~ SOSY
PREFILLED_SYRINGE | SUBCUTANEOUS | Status: AC
  Filled 2020-08-03: qty 1

## 2020-09-29 ENCOUNTER — Other Ambulatory Visit: Payer: Self-pay

## 2020-09-29 ENCOUNTER — Encounter: Payer: Self-pay | Admitting: Oncology

## 2020-09-29 ENCOUNTER — Ambulatory Visit: Payer: BC Managed Care – PPO | Attending: Internal Medicine

## 2020-09-29 ENCOUNTER — Other Ambulatory Visit (HOSPITAL_BASED_OUTPATIENT_CLINIC_OR_DEPARTMENT_OTHER): Payer: Self-pay

## 2020-09-29 DIAGNOSIS — Z23 Encounter for immunization: Secondary | ICD-10-CM

## 2020-09-29 MED ORDER — PFIZER-BIONT COVID-19 VAC-TRIS 30 MCG/0.3ML IM SUSP
INTRAMUSCULAR | 0 refills | Status: DC
Start: 2020-09-29 — End: 2021-04-12
  Filled 2020-09-29: qty 0.3, 1d supply, fill #0

## 2020-11-06 ENCOUNTER — Encounter: Payer: Self-pay | Admitting: Internal Medicine

## 2021-01-26 ENCOUNTER — Other Ambulatory Visit: Payer: Self-pay

## 2021-01-26 ENCOUNTER — Ambulatory Visit (INDEPENDENT_AMBULATORY_CARE_PROVIDER_SITE_OTHER): Payer: BC Managed Care – PPO

## 2021-01-26 DIAGNOSIS — H1045 Other chronic allergic conjunctivitis: Secondary | ICD-10-CM | POA: Insufficient documentation

## 2021-01-26 DIAGNOSIS — Z23 Encounter for immunization: Secondary | ICD-10-CM | POA: Diagnosis not present

## 2021-01-26 DIAGNOSIS — T781XXA Other adverse food reactions, not elsewhere classified, initial encounter: Secondary | ICD-10-CM | POA: Insufficient documentation

## 2021-01-26 DIAGNOSIS — J3081 Allergic rhinitis due to animal (cat) (dog) hair and dander: Secondary | ICD-10-CM | POA: Insufficient documentation

## 2021-01-26 DIAGNOSIS — J301 Allergic rhinitis due to pollen: Secondary | ICD-10-CM | POA: Insufficient documentation

## 2021-01-26 NOTE — [facility] Notes (Signed)
Reg flu vacc given w/o any complications.

## 2021-02-07 ENCOUNTER — Ambulatory Visit (HOSPITAL_COMMUNITY)
Admission: RE | Admit: 2021-02-07 | Discharge: 2021-02-07 | Disposition: A | Discharge status: Home / Self Care | Payer: BC Managed Care – PPO | Source: Ambulatory Visit | Attending: Oncology | Admitting: Oncology

## 2021-02-07 ENCOUNTER — Other Ambulatory Visit: Payer: Self-pay

## 2021-02-07 DIAGNOSIS — Z170 Estrogen receptor positive status [ER+]: Secondary | ICD-10-CM | POA: Diagnosis present

## 2021-02-07 DIAGNOSIS — C50412 Malignant neoplasm of upper-outer quadrant of left female breast: Secondary | ICD-10-CM | POA: Insufficient documentation

## 2021-02-07 DIAGNOSIS — C50912 Malignant neoplasm of unspecified site of left female breast: Secondary | ICD-10-CM | POA: Insufficient documentation

## 2021-02-07 MED ORDER — GADOBUTROL 1 MMOL/ML IV SOLN
8.0000 mL | Freq: Once | INTRAVENOUS | Status: AC | PRN
  Administered 2021-02-07: 8 mL via INTRAVENOUS

## 2021-02-14 ENCOUNTER — Other Ambulatory Visit: Payer: Self-pay | Admitting: *Deleted

## 2021-02-14 DIAGNOSIS — Z170 Estrogen receptor positive status [ER+]: Secondary | ICD-10-CM

## 2021-02-15 ENCOUNTER — Inpatient Hospital Stay: Payer: BC Managed Care – PPO | Attending: Oncology

## 2021-02-15 ENCOUNTER — Other Ambulatory Visit: Payer: Self-pay

## 2021-02-15 ENCOUNTER — Inpatient Hospital Stay: Payer: BC Managed Care – PPO

## 2021-02-15 ENCOUNTER — Inpatient Hospital Stay: Payer: BC Managed Care – PPO | Admitting: Oncology

## 2021-02-15 VITALS — BP 164/74 | HR 84 | Temp 98.1°F | Resp 18 | Ht 64.0 in | Wt 161.5 lb

## 2021-02-15 DIAGNOSIS — C7951 Secondary malignant neoplasm of bone: Secondary | ICD-10-CM | POA: Diagnosis not present

## 2021-02-15 DIAGNOSIS — C50912 Malignant neoplasm of unspecified site of left female breast: Secondary | ICD-10-CM | POA: Diagnosis not present

## 2021-02-15 DIAGNOSIS — Z170 Estrogen receptor positive status [ER+]: Secondary | ICD-10-CM | POA: Insufficient documentation

## 2021-02-15 DIAGNOSIS — N8500 Endometrial hyperplasia, unspecified: Secondary | ICD-10-CM

## 2021-02-15 DIAGNOSIS — C50412 Malignant neoplasm of upper-outer quadrant of left female breast: Secondary | ICD-10-CM | POA: Diagnosis not present

## 2021-02-15 DIAGNOSIS — Z79811 Long term (current) use of aromatase inhibitors: Secondary | ICD-10-CM | POA: Diagnosis not present

## 2021-02-15 DIAGNOSIS — M8580 Other specified disorders of bone density and structure, unspecified site: Secondary | ICD-10-CM | POA: Insufficient documentation

## 2021-02-15 LAB — CMP (CANCER CENTER ONLY)
ALT: 13 U/L (ref 0–44)
AST: 19 U/L (ref 15–41)
Albumin: 4.1 g/dL (ref 3.5–5.0)
Alkaline Phosphatase: 88 U/L (ref 38–126)
Anion gap: 12 (ref 5–15)
BUN: 22 mg/dL — ABNORMAL HIGH (ref 6–20)
CO2: 23 mmol/L (ref 22–32)
Calcium: 9.9 mg/dL (ref 8.9–10.3)
Chloride: 102 mmol/L (ref 98–111)
Creatinine: 1.02 mg/dL — ABNORMAL HIGH (ref 0.44–1.00)
GFR, Estimated: >60 mL/min (ref >60)
Glucose, Bld: 124 mg/dL — ABNORMAL HIGH (ref 70–99)
Potassium: 4.6 mmol/L (ref 3.5–5.1)
Sodium: 137 mmol/L (ref 135–145)
Total Bilirubin: 1.4 mg/dL — ABNORMAL HIGH (ref 0.3–1.2)
Total Protein: 7.2 g/dL (ref 6.5–8.1)

## 2021-02-15 LAB — CBC WITH DIFFERENTIAL (CANCER CENTER ONLY)
Abs Immature Granulocytes: 0.02 10*3/uL (ref 0.00–0.07)
Basophils Absolute: 0.1 10*3/uL (ref 0.0–0.1)
Basophils Relative: 1 %
Eosinophils Absolute: 0.1 10*3/uL (ref 0.0–0.5)
Eosinophils Relative: 1 %
HCT: 42 % (ref 36.0–46.0)
Hemoglobin: 14.4 g/dL (ref 12.0–15.0)
Immature Granulocytes: 0 %
Lymphocytes Relative: 19 %
Lymphs Abs: 1.4 10*3/uL (ref 0.7–4.0)
MCH: 29.6 pg (ref 26.0–34.0)
MCHC: 34.3 g/dL (ref 30.0–36.0)
MCV: 86.4 fL (ref 80.0–100.0)
Monocytes Absolute: 0.6 10*3/uL (ref 0.1–1.0)
Monocytes Relative: 8 %
Neutro Abs: 5.4 10*3/uL (ref 1.7–7.7)
Neutrophils Relative %: 71 %
Platelet Count: 232 10*3/uL (ref 150–400)
RBC: 4.86 MIL/uL (ref 3.87–5.11)
RDW: 11.8 % (ref 11.5–15.5)
WBC Count: 7.6 10*3/uL (ref 4.0–10.5)
nRBC: 0 % (ref 0.0–0.2)

## 2021-02-15 MED ORDER — DENOSUMAB 60 MG/ML ~~LOC~~ SOSY
60.0000 mg | PREFILLED_SYRINGE | Freq: Once | SUBCUTANEOUS | Status: AC
  Administered 2021-02-15: 60 mg via SUBCUTANEOUS

## 2021-02-15 MED ORDER — ANASTROZOLE 1 MG PO TABS: 1.0000 mg | ORAL_TABLET | Freq: Every day | ORAL | 4 refills | Status: DC

## 2021-02-15 NOTE — Progress Notes (Signed)
[facility]  [phone] [phone]   ID: Erin [name] DOB: 05/03/1960 MR#: [id number] CSN#: [id number]     Patient Care Team: Myrlene Broker, MD as PCP - General (Internal Medicine) Samani Deal, Valentino Hue, MD as Consulting Physician (Oncology) Emelia Loron, MD as Consulting Physician (General Surgery) Genia Del, MD as Consulting Physician (Obstetrics and Gynecology) Rhea Belton, Carie Caddy, MD as Consulting Physician (Gastroenterology) Huston Foley, MD as Attending Physician (Neurology) Irene Limbo, DDS as Referring Physician (Dentistry) Judi Saa, DO as Consulting Physician (Family Medicine) Elmon Else, MD as Consulting Physician (Dermatology) Lonie Peak, MD as Attending Physician (Radiation Oncology) Irene Limbo, DDS as Referring Physician (Dentistry) OTHER MD: Hillery Aldo  CHIEF COMPLAINT: Recurrent breast cancer (s/p bilateral mastectomies)  CURRENT THERAPY: anastrozole; denosumab/Prolia   INTERVAL HISTORY: Erin [name] returns today for follow-up of her recurrent breast cancer.   She continues on anastrozole.  She tolerates this well, with no significant issues regarding hot flashes or vaginal dryness.  Her most bone density screening on 02/11/2020 showed a T-score of -1.4, which is considered osteopenic.  Recall she took [place] previously, until May 2019.  She was switched to Prolia April of this year and will receive her second dose today   Since her last visit, she underwent restaging pelvis MRI on 02/07/2021 showing: slight interval increase in size of right ischial tuberosity lesion with increased T2 hyperintense signal and mild enhancement compared to prior study.   REVIEW OF SYSTEMS: Erin [name] is understandably anxious regarding the new MRI finding.  Otherwise she is doing very well.  She walks about 35 minutes about 5 days a week.  She continues to teach full-time.  A detailed review of systems was otherwise  noncontributory   COVID 19 VACCINATION STATUS: Pfizer x4 as of October 2022  BREAST CANCER HISTORY From the original intake note 04/07/2011:  Erin [name] had routine screening mammography December of 2009 at the [facility] in [place], [place], showing an increasing asymmetry in her left breast. She was recalled for additional views December 29. She was noted to have heterogeneously dense breasts. It was a 5 cm area of architectural distortion in the lateral aspect of the left breast with no associated calcifications. There was a second lesion medial to this. Ultrasound showed a highly suspicious hypoechoic irregularly marginated mass measuring 3 cm at the 2:30 position 5 cm from the nipple. This was palpable to the mammographer. The second area in question I measured 7 mm and a third lesion was noted measuring 5 mm. Some left axillary lymph nodes were morphologically normal.  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 Bilateral breast MRIs were performed 05/04/2008 and showed, in the left breast, a large lobulated mass measuring up to 4.3 cm, and including both of the apparently separate masses the previously biopsied. In the right breast there were 2 indeterminate lesions. These were evaluated further with breast specific gamma imaging performed January 14 in both lesions were negative. The lesion in the left breast was markedly abnormal.  Given this complex history and with the background of significant breast density, the patient opted for bilateral mastectomies with left sentinel lymph node sampling. This was performed of 06/27/2008 and showed([id number]. [id number]) in the right breast, no malignancy. In the left breast there was a 3.2 cm invasive ductal carcinoma, grade 1, focally extending to the anterior margin  of the lower outer quadrant. There  was extensive angiolymphatic invasion, but both sentinel lymph nodes on the left were negative.  The patient received left-sided postmastectomy radiation including of the left chest wall and left supraclavicular fossa to a total dose of 50.4 Gy plus a 10 Gy scar boost. She had an Oncotype DX recurrence score of 17, further discussed below. She decided to forego reconstruction. She was tested for BRCA1 and 2 and was found to be negative. Given her overall prognosis, she did not receive chemotherapy, but started tamoxifen February of 2010, with good tolerance.  RECURRENT DISEASE:  SUMMARY OF PRESENTATION  [name] took tamoxifen for 5 years with no evidence of active disease. In May 2015 we decided to stop the tamoxifen because of problems with endometrial hyperplasia. 6 months later, at the November visit, she was found to have a change in a small area of scaring her left inframammary fold. She was referred to dermatology and biopsy 04/21/2014 showed ([id number]) recurrent ductal adenocarcinoma (positive for gross cystic disease fluid protein, estrogen, and progesterone). She then underwent a staging PET scan in [facility] 05/06/2014. This showed a 1.9 cm sclerotic lesion in the right ischial tuberosity, with a maximum standard uptake value of 4.6. A sclerotic lesion in the left side of the L3 vertebral body measuring 1.6 cm had no hypermetabolic activity.  Her subsequent history is as detailed below.   PAST MEDICAL HISTORY: Past Medical History:  Diagnosis Date   Allergic rhinitis due to pollen    Breast cancer ([place]) 2010   Breast cancer ([place]) 2016   reoccurrence in scar   History of radiation therapy 04/26/19- 05/05/19   Right ischium 5 fractions of 10 Gy each to total 50 Gy.    Hypertension    Hypothyroidism    Osteopenia    S/P radiation therapy 07/12/2014 through 08/16/2014                                                        Left chest wall 4000 cGy in 20 sessions, left chest  wall boost 1000 cGy in 5 sessions     Wears glasses   Diverticulosis     PAST SURGICAL HISTORY: Past Surgical History:  Procedure Laterality Date   BREAST LUMPECTOMY Left 06/01/2014   Procedure: WIDE LOCAL EXCISION OF RECURRENT LEFT BREAST CANCER;  Surgeon: Rolm Bookbinder, MD;  Location: [facility];  Service: General;  Laterality: Left;   COLONOSCOPY     cyst removed     left lower jaw - at age 64 yr   [place] N/A 09/16/2013   Procedure: [place];  Surgeon: Princess Bruins, MD;  Location: [facility];  Service: Gynecology;  Laterality: N/A;  1 hr.   INGUINAL HERNIA REPAIR     right side as infant   LAPAROSCOPIC PELVIC LYMPH NODE BIOPSY     MASTECTOMY  2010   bilateral mastectomies-lt snbx-radiation post   MASTECTOMY Bilateral    OOPHORECTOMY  2016   patient denies   Right knee arthroscopy  2008   ROBOTIC ASSISTED BILATERAL SALPINGO OOPHERECTOMY Bilateral 03/13/2015   Procedure: ROBOTIC ASSISTED BILATERAL SALPINGO OOPHORECTOMY With [place];  Surgeon: Princess Bruins, MD;  Location: [facility];  Service: Gynecology;  Laterality: Bilateral;   TONSILLECTOMY  WISDOM TOOTH EXTRACTION      FAMILY HISTORY Family History  Problem Relation Age of Onset   Hypertension Mother    Cancer Mother        Lung Cancer - long history of tobacco use   Hypertension Father    Stroke Maternal Grandfather    Stroke Paternal Grandfather    Hypertension Brother    Colon cancer Neg Hx    Stomach cancer Neg Hx   The patient's mother died in 4503 from complications of lung cancer. The patient has not been in touch with her father her for approximately 30 years. She had no sisters, 1 brother, who is in good health. There is no breast or ovarian cancer in the family to her knowledge.   GYNECOLOGIC HISTORY:  GX P0, menarche at around age 33, the patient continued to have periods despite being on  tamoxifen, although more irregularly. She is now postmenopausal (status post BSO).   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. She lives alone and has no pets. Her work involves quite a bit of travel, though this has been interrupted by the pandemic.Marland Kitchen   ADVANCED DIRECTIVES:  Eleonore Chiquito is HCPOA--she can be reached at [phone]   HEALTH MAINTENANCE:     Social History   Tobacco Use   Smoking status: Never   Smokeless tobacco: Never  Vaping Use   Vaping Use: Never used  Substance Use Topics   Alcohol use: No    Alcohol/week: 0.0 standard drinks    Comment: rare   Drug use: No      Colonoscopy: December 2013 Collene Mares)  PAP: Feb 2011  Bone density: 06/2009, normal  Cholesterol: "good"    Allergies  Allergen Reactions   Pollen Extract    Adhesive [Tape] Dermatitis and Rash    Paper tape is better to use    MEDICATIONS:   Current Outpatient Medications  Medication Sig Dispense Refill   acetaminophen (TYLENOL) 500 MG tablet Take 500 mg by mouth every 6 (six) hours as needed for moderate pain or headache.     acyclovir ointment (ZOVIRAX) 5 % Apply 1 application topically every 3 (three) hours. As needed 5 g 5   anastrozole (ARIMIDEX) 1 MG tablet Take 1 tablet (1 mg total) by mouth daily. 90 tablet 4   Cholecalciferol (VITAMIN D3) 2000 UNITS capsule Take 2,000 Units by mouth daily.     Ciclopirox 1 % shampoo Use topically on scalp 120 mL 0   COVID-19 mRNA Vac-TriS, Pfizer, (PFIZER-BIONT COVID-19 VAC-TRIS) SUSP injection Inject into the muscle. 0.3 mL 0   fexofenadine (ALLEGRA) 180 MG tablet Take 180 mg by mouth daily as needed for allergies or rhinitis.     fluocinolone (SYNALAR) 0.01 % external solution Apply 1 application topically 2 (two) times daily as needed (irritation).      ibuprofen (ADVIL,MOTRIN) 200 MG tablet Take 200 mg by mouth every 6 (six) hours as needed for moderate pain.     ketoconazole (NIZORAL) 2 % shampoo Apply 1 application topically 2 (two)  times a week.     levothyroxine (SYNTHROID) 75 MCG tablet Take 1 tablet (75 mcg total) by mouth daily before breakfast. 90 tablet 3   losartan (COZAAR) 100 MG tablet Take 1 tablet (100 mg total) by mouth daily. 90 tablet 3   Olopatadine HCl (PATADAY) 0.7 % SOLN Apply to eye.     Olopatadine HCl 0.6 % SOLN Place 2 sprays into both nostrils 2 (two) times daily as needed (allergies).  Polyethyl Glycol-Propyl Glycol (SYSTANE OP) Place 1 drop into both eyes daily as needed (for allergies).      Turmeric 500 MG CAPS Take 500 mg by mouth daily.      No current facility-administered medications for this visit.    OBJECTIVE:  white woman who appears younger than stated age    15:   02/15/21 1439  BP: (!) 164/74  Pulse: 84  Resp: 18  Temp: 98.1 F (36.7 C)  SpO2: 98%      Body mass index is 27.72 kg/m.   ECOG FS: 1 - Symptomatic but completely ambulatory  Sclerae unicteric, EOMs intact Wearing a mask No cervical or supraclavicular adenopathy Lungs no rales or rhonchi Heart regular rate and rhythm Abd soft, nontender, positive bowel sounds MSK no focal spinal tenderness, no upper extremity lymphedema Neuro: nonfocal, well oriented, appropriate affect Breasts: Status post bilateral mastectomies, and is status post radiation to the left chest wall.  There are some telangiectasias on the left which are essentially unchanged.  There is no evidence of local recurrence.  Both axillae are benign   LABS:           Chemistry      Component Value Date/Time   NA 137 02/15/2021 1422   NA 139 02/28/2017 1521   K 4.6 02/15/2021 1422   K 4.6 02/28/2017 1521   CL 102 02/15/2021 1422   CL 105 08/10/2012 0944   CO2 23 02/15/2021 1422   CO2 29 02/28/2017 1521   BUN 22 (H) 02/15/2021 1422   BUN 20.0 02/28/2017 1521   CREATININE 1.02 (H) 02/15/2021 1422   CREATININE 1.0 02/28/2017 1521      Component Value Date/Time   CALCIUM 9.9 02/15/2021 1422   CALCIUM 10.4 02/28/2017 1521    ALKPHOS 88 02/15/2021 1422   ALKPHOS 62 02/28/2017 1521   AST 19 02/15/2021 1422   AST 18 02/28/2017 1521   ALT 13 02/15/2021 1422   ALT 13 02/28/2017 1521   BILITOT 1.4 (H) 02/15/2021 1422   BILITOT 1.17 02/28/2017 1521         Lab Results  Component Value Date   WBC 7.6 02/15/2021   HGB 14.4 02/15/2021   HCT 42.0 02/15/2021   MCV 86.4 02/15/2021   PLT 232 02/15/2021   NEUTROABS 5.4 02/15/2021    STUDIES  MR Pelvis W Wo Contrast  Result Date: 02/09/2021 CLINICAL DATA:  Follow-up right ischial metastasis. History of breast cancer. EXAM: MRI PELVIS WITHOUT AND WITH CONTRAST TECHNIQUE: Multiplanar multisequence MR imaging of the pelvis was performed both before and after administration of intravenous contrast. CONTRAST:  45mL GADAVIST GADOBUTROL 1 MMOL/ML IV SOLN COMPARISON:  MRI pelvis dated February 15, 2020. FINDINGS: Musculoskeletal: The right ischial tuberosity lesion currently measures 2.2 x 1.7 x 2.6 cm, previously 2.0 x 1.2 cm. There is increased T2 hyperintense signal and mild enhancement of the lesion compared to the prior study. Interspersed fatty marrow is again seen within the lesion. Mild muscle and soft tissue edema surrounding the lesion is relatively unchanged. No new bone lesion. Unchanged trace amount of fluid in the left greater trochanteric bursa. Urinary Tract:  No abnormality visualized. Bowel:  Sigmoid diverticulosis. Vascular/Lymphatic: No pathologically enlarged lymph nodes. No significant vascular abnormality seen. Reproductive:  Tiny intramural uterine fibroid again noted. Other:  None. IMPRESSION: 1. Slight interval increase in size of the right ischial tuberosity lesion with increased T2 hyperintense signal and mild enhancement compared to the prior study, concerning for residual metastatic  disease. No new bone lesions. Electronically Signed   By: Titus Dubin M.D.   On: 02/09/2021 09:41     ASSESSMENT: 60 y.o.  BRCA negative [place] woman ,  (1) status  post bilateral mastectomies January 2010 for a left-sided upper outer quadrant T2 N0 (stage IIA) invasive ductal carcinoma, grade 1, strongly estrogen and progesterone receptor positive, HER-2 nonamplified,   (2) Oncotype DX recurrence score of 17, predicting a 10-12% distant recurrence risk over 10 years if her only adjuvant treatment is tamoxifen for 5 years  (3) status post Left postmastectomy radiation including Left supraclavicular fossa (5040 cGy in 28 sessions)  (4) on tamoxifen starting February of 2010, stopped May 2015 because of endometrial hyperplasia  (5) osteopenia-- T score -1.2 on repeated bone density scans 09/24/2011 and 12/12/2013  (a) bone density 01/30/2018 showed a T score of +0.1  (b) bone density 02/11/2020 showed a T score of -0.4   RECURRENT DISEASE (6) skin biopsy of a lesion under the left breast scar 04/21/2014 shows adenocarcinoma, gross cystic disease fluid protein, estrogen and progesterone receptor positive  (a) s/p wide excision with negative though close margins 06/01/2014, prognostic panel pending  (b) radiation to Left chest wall 4000 cGy in 20 sessions, left s chest wall boost 1000 cGy in 5 session  07/12/2014 through 08/16/2014       (7) PET scan 05/06/2014 and bone scan show only one additional area of concern, in the right ischiial tuberosity--  (a) biopsy of the right ischial tuberosity 05/24/2014 showed no evidence of malignancy  (b) zolendronate started 06/06/2014, poorly tolerated  (c) denosumab/ Xgeva started 09/26/2014, switched to Prolia as of May 2017  (d) repeat bone scan 12/20/2014 shows no new/additional areas of disease  (e) restaging studies 07/07/2015 do not support a diagnosis of metastatic disease.  (f) MRI of the pelvis 04/13/2016 and 03/19/2018 shows the lesion in the right ischial tuberosity to be unchanged  (g) pelvic MRI 02/12/2019 shows slight growth and possible increased activity of the ischial lesion  (h) PET scan 02/22/2019  shows no other areas of uptake.  The right ischial lesion area has possibly slightly progressed as compared to prior  (i) repeat biopsy of the right ischial lesion shows very minute clusters of atypical epithelioid cells highlighted by cytokeratin AE1/AE3 and 8/18, GA TA 3+, with no gross cystic disease fluid protein positivity, and estrogen and progesterone receptor negative: this is not metastatic breast cancer  (8) goserelin monthly started 05/09/2016, last dose 02/20/2015  (a) s/p BSO 03/13/2015 with benign pathology  (9) anastrozole started  08/29/2014  (a) bone density 12/13/2015 shows improvement, with the lumbar spine T score now at 0.1, the femoral neck -1.1, both increased  (b) continues on denosumab/Prolia most recent dose 09/05/2017  (c) repeat bone density 01/30/2018 shows a T score of 0.1  (d) denosumab/Prolia discontinued after 09/05/2017 dose  (10) status post  SBRT treatment /07/2019 through 05/05/2019 to the right ischial tuberosity lesion  (a) no uptake at the area in question on bone scan 10/21/2019  (b) MRI of the pelvis 02/16/2020 shows significant improvement in the right ischial tuberosity lesion  (c) MRI of the pelvis 02/09/2021 shows slight increase in the right ischial tuberosity lesion  (11) denosumab/Xgeva resumed 05/13/2019, repeated every 12 weeks  (a) discontinued after 02/16/2020 dose  (b) denosumab/Prolia resumed April 2022    PLAN: [name] is coming up on 7 years from definitive diagnosis of locally recurrent breast cancer.  She also has a lesion in  the ischial tuberosity described above.  This lesion has shown some change in the most recent MRI, increasing to slightly over an inch in its largest dimension  We have biopsied this twice.  The second biopsy I think was conclusive that we are not dealing with breast cancer.  I wish I could give her a specific diagnosis but what ever this is, possibly a benign primary bone lesion, is very slow-growing if it is  growing at all.  The changes we see really may be reactive to the prior radiation.  The fact that there is fat inside the lesion is also very reassuring.  We certainly do not want to rebiopsy this and the only way I can see to reassure ourselves that we are not dealing indeed with metastatic disease is to obtain a PET scan.  If that is negative elsewhere then we are left with this lesion and very likely we will repeat a pelvic MRI sometime 6 to 8 months from now.  Of course if the PET scan turns positive elsewhere we will have to reconsider all this.  We are continuing the anastrozole through spring 2023 and at that point she will likely come off that.  Whether she will want to go on any other treatment for breast cancer at that point will need to be decided then but we could start observation at that point as far as the breast cancer itself is concerned  I am going to set her up for a virtual visit in December by which time we should have the PET results.  She will then return to see [place] either in April or May as discussed above  Total encounter time 40 minutes.*   Batoul Limes, [name], MD  02/15/21 5:05 PM Medical Oncology and Hematology [facility] [place], [place] [address] Tel. [phone]    Fax. [phone]    I, Wilburn Mylar, am acting as scribe for Dr. [name]. Seaton Hofmann.  I, Lurline Del MD, have reviewed the above documentation for accuracy and completeness, and I agree with the above.   *Total Encounter Time as defined by the Centers for Medicare and Medicaid Services includes, in addition to the face-to-face time of a patient visit (documented in the note above) non-face-to-face time: obtaining and reviewing outside history, ordering and reviewing medications, tests or procedures, care coordination (communications with other health care professionals or caregivers) and documentation in the medical record.

## 2021-02-15 NOTE — Patient Instructions (Signed)
Denosumab injection What is this medication? DENOSUMAB (den oh sue mab) slows bone breakdown. Prolia is used to treat osteoporosis in women after menopause and in men, and in people who are taking corticosteroids for 6 months or more. Xgeva is used to treat a high calcium level due to cancer and to prevent bone fractures and other bone problems caused by multiple myeloma or cancer bone metastases. Xgeva is also used to treat giant cell tumor of the bone. This medicine may be used for other purposes; ask your health care provider or pharmacist if you have questions. COMMON BRAND NAME(S): Prolia, XGEVA What should I tell my care team before I take this medication? They need to know if you have any of these conditions: dental disease having surgery or tooth extraction infection kidney disease low levels of calcium or Vitamin D in the blood malnutrition on hemodialysis skin conditions or sensitivity thyroid or parathyroid disease an unusual reaction to denosumab, other medicines, foods, dyes, or preservatives pregnant or trying to get pregnant breast-feeding How should I use this medication? This medicine is for injection under the skin. It is given by a health care professional in a hospital or clinic setting. A special MedGuide will be given to you before each treatment. Be sure to read this information carefully each time. For Prolia, talk to your pediatrician regarding the use of this medicine in children. Special care may be needed. For Xgeva, talk to your pediatrician regarding the use of this medicine in children. While this drug may be prescribed for children as young as 13 years for selected conditions, precautions do apply. Overdosage: If you think you have taken too much of this medicine contact a poison control center or emergency room at once. NOTE: This medicine is only for you. Do not share this medicine with others. What if I miss a dose? It is important not to miss your dose.  Call your doctor or health care professional if you are unable to keep an appointment. What may interact with this medication? Do not take this medicine with any of the following medications: other medicines containing denosumab This medicine may also interact with the following medications: medicines that lower your chance of fighting infection steroid medicines like prednisone or cortisone This list may not describe all possible interactions. Give your health care provider a list of all the medicines, herbs, non-prescription drugs, or dietary supplements you use. Also tell them if you smoke, drink alcohol, or use illegal drugs. Some items may interact with your medicine. What should I watch for while using this medication? Visit your doctor or health care professional for regular checks on your progress. Your doctor or health care professional may order blood tests and other tests to see how you are doing. Call your doctor or health care professional for advice if you get a fever, chills or sore throat, or other symptoms of a cold or flu. Do not treat yourself. This drug may decrease your body's ability to fight infection. Try to avoid being around people who are sick. You should make sure you get enough calcium and vitamin D while you are taking this medicine, unless your doctor tells you not to. Discuss the foods you eat and the vitamins you take with your health care professional. See your dentist regularly. Brush and floss your teeth as directed. Before you have any dental work done, tell your dentist you are receiving this medicine. Do not become pregnant while taking this medicine or for 5 months after   stopping it. Talk with your doctor or health care professional about your birth control options while taking this medicine. Women should inform their doctor if they wish to become pregnant or think they might be pregnant. There is a potential for serious side effects to an unborn child. Talk to  your health care professional or pharmacist for more information. What side effects may I notice from receiving this medication? Side effects that you should report to your doctor or health care professional as soon as possible: allergic reactions like skin rash, itching or hives, swelling of the face, lips, or tongue bone pain breathing problems dizziness jaw pain, especially after dental work redness, blistering, peeling of the skin signs and symptoms of infection like fever or chills; cough; sore throat; pain or trouble passing urine signs of low calcium like fast heartbeat, muscle cramps or muscle pain; pain, tingling, numbness in the hands or feet; seizures unusual bleeding or bruising unusually weak or tired Side effects that usually do not require medical attention (report to your doctor or health care professional if they continue or are bothersome): constipation diarrhea headache joint pain loss of appetite muscle pain runny nose tiredness upset stomach This list may not describe all possible side effects. Call your doctor for medical advice about side effects. You may report side effects to FDA at 1-800-FDA-1088. Where should I keep my medication? This medicine is only given in a clinic, doctor's office, or other health care setting and will not be stored at home. NOTE: This sheet is a summary. It may not cover all possible information. If you have questions about this medicine, talk to your doctor, pharmacist, or health care provider.  2022 [employer]/Gold Standard (2017-08-15 16:10:44)

## 2021-02-23 ENCOUNTER — Ambulatory Visit: Payer: BC Managed Care – PPO

## 2021-03-07 ENCOUNTER — Telehealth: Payer: Self-pay | Admitting: Adult Health

## 2021-03-07 NOTE — Telephone Encounter (Signed)
From Auth team:   Per [facility] the PET Scan for this patient does not meet medical necessity at this time. Please call [phone] to speak with an [facility] physician reviewer. Pending order# [id number]    Member Information:  Erin [name], Erin [name]  Member #: [id number]  [address]  [place] , [place] [id number]  Date of Birth: December 08, 1960  Phone: [phone]    Called # above 03/07/2021 at 1245.  I reviewed the information that was in Dr. [name] 02/15/2021 office visit note.    PET approved #[id number] valid from 11/16 to 04/05/2021.  Wilber Bihari, NP

## 2021-03-14 ENCOUNTER — Other Ambulatory Visit: Payer: Self-pay

## 2021-03-14 ENCOUNTER — Ambulatory Visit (HOSPITAL_COMMUNITY)
Admission: RE | Admit: 2021-03-14 | Discharge: 2021-03-14 | Disposition: A | Discharge status: Home / Self Care | Payer: BC Managed Care – PPO | Source: Ambulatory Visit | Attending: Oncology | Admitting: Oncology

## 2021-03-14 DIAGNOSIS — Z170 Estrogen receptor positive status [ER+]: Secondary | ICD-10-CM | POA: Insufficient documentation

## 2021-03-14 DIAGNOSIS — C50412 Malignant neoplasm of upper-outer quadrant of left female breast: Secondary | ICD-10-CM | POA: Diagnosis present

## 2021-03-14 DIAGNOSIS — C50912 Malignant neoplasm of unspecified site of left female breast: Secondary | ICD-10-CM | POA: Insufficient documentation

## 2021-03-14 DIAGNOSIS — C7951 Secondary malignant neoplasm of bone: Secondary | ICD-10-CM | POA: Diagnosis present

## 2021-03-14 LAB — GLUCOSE, CAPILLARY: Glucose-Capillary: 90 mg/dL (ref 70–99)

## 2021-03-14 MED ORDER — FLUDEOXYGLUCOSE F - 18 (FDG) INJECTION
8.1000 | Freq: Once | INTRAVENOUS | Status: AC | PRN
  Administered 2021-03-14: 7.6 via INTRAVENOUS

## 2021-03-19 ENCOUNTER — Other Ambulatory Visit: Payer: Self-pay | Admitting: Oncology

## 2021-03-19 NOTE — Progress Notes (Signed)
I reviewed the results of the repeat PET scan with the radiologist, who dictated a very helpful addendum, and I called Erin [name] and left her a voicemail letting her know that things look very good.

## 2021-03-23 ENCOUNTER — Ambulatory Visit: Payer: BC Managed Care – PPO | Attending: Internal Medicine

## 2021-03-23 ENCOUNTER — Other Ambulatory Visit (HOSPITAL_BASED_OUTPATIENT_CLINIC_OR_DEPARTMENT_OTHER): Payer: Self-pay

## 2021-03-23 DIAGNOSIS — Z23 Encounter for immunization: Secondary | ICD-10-CM

## 2021-03-23 MED ORDER — PFIZER COVID-19 VAC BIVALENT 30 MCG/0.3ML IM SUSP
INTRAMUSCULAR | 0 refills | Status: DC
  Filled 2021-03-23: qty 0.3, 1d supply, fill #0

## 2021-03-23 NOTE — Progress Notes (Signed)
   Covid-19 Vaccination Clinic  Name:  Erin [name]    MRN: [id number] DOB: 11/04/1960  03/23/2021  [name] was observed post Covid-19 immunization for 15 minutes without incident. She was provided with Vaccine Information Sheet and instruction to access the V-Safe system.   [name] was instructed to call 911 with any severe reactions post vaccine: Difficulty breathing  Swelling of face and throat  A fast heartbeat  A bad rash all over body  Dizziness and weakness   Immunizations Administered     Name Date Dose VIS Date Route   Pfizer Covid-19 Vaccine Bivalent Booster 03/23/2021  3:27 PM 0.3 mL 12/20/2020 Intramuscular   Manufacturer: [place]   Lot: [id number]   [place]: [phone]

## 2021-03-28 NOTE — Progress Notes (Signed)
[facility]  [phone] [phone]   ID: [name] DOB: 1960/12/20 MR#: [id number] CSN#: [id number]     Patient Care Team: [name], MD as PCP - General (Internal Medicine) Yocelin Vanlue, [name], MD as Consulting Physician (Oncology) Rolm Bookbinder, MD as Consulting Physician (General Surgery) Princess Bruins, MD as Consulting Physician (Obstetrics and Gynecology) Hilarie Fredrickson, Lajuan Lines, MD as Consulting Physician (Gastroenterology) Star Age, MD as Attending Physician (Neurology) Augustina Mood, DDS as Referring Physician (Dentistry) Lyndal Pulley, DO as Consulting Physician (Family Medicine) Jari Pigg, MD as Consulting Physician (Dermatology) Eppie Gibson, MD as Attending Physician (Radiation Oncology) Augustina Mood, DDS as Referring Physician (Dentistry) OTHER MD: Netty Starring  CHIEF COMPLAINT: Recurrent breast cancer (s/p bilateral mastectomies)  CURRENT THERAPY: anastrozole; denosumab/Prolia   INTERVAL HISTORY: [name] returns today for follow-up of her recurrent breast cancer.   Since her last visit, she underwent restaging PET scan on 03/14/2021 showing: no evidence of FDG-avid metastatic disease; right ischial tuberosity lesion appears more sclerotic with some associated new lucent areas, likely due to posttreatment change.  She continues on anastrozole.  She tolerates this well, with no significant issues regarding hot flashes or vaginal dryness.  Her most bone density screening on 02/11/2020 showed a T-score of -1.4, which is considered osteopenic.  Recall she took [place] previously, until May 2019.  She was switched to Prolia April of this year and will receive her second dose today    REVIEW OF SYSTEMS: [name] has occasionally some discomfort or shooting pains particularly in the left chest wall area and she is aware that she has some irregular scar tissue in that area as well.  A detailed review of systems today was  otherwise noncontributory   COVID 19 VACCINATION STATUS: Pfizer x5, last 03/2021   BREAST CANCER HISTORY From the original intake note 04/07/2011:  Ms. Isaacs had routine screening mammography December of 2009 at the [facility] in [place], [place], showing an increasing asymmetry in her left breast. She was recalled for additional views December 29. She was noted to have heterogeneously dense breasts. It was a 5 cm area of architectural distortion in the lateral aspect of the left breast with no associated calcifications. There was a second lesion medial to this. Ultrasound showed a highly suspicious hypoechoic irregularly marginated mass measuring 3 cm at the 2:30 position 5 cm from the nipple. This was palpable to the mammographer. The second area in question I measured 7 mm and a third lesion was noted measuring 5 mm. Some left axillary lymph nodes were morphologically normal.  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 Bilateral breast MRIs were performed 05/04/2008 and showed, in the left breast, a large lobulated mass measuring up to 4.3 cm, and including both of the apparently separate masses the previously biopsied. In the right breast there were 2 indeterminate lesions. These were evaluated further with breast specific gamma imaging performed January 14 in both lesions were negative. The lesion in the left breast was markedly abnormal.  Given this complex history and with the background of significant breast density, the patient opted for bilateral mastectomies with left sentinel lymph node sampling. This was performed of 06/27/2008 and showed([id number]. [id number]) in the right breast, no malignancy. In the left breast there was a 3.2 cm invasive ductal carcinoma, grade 1, focally extending to the anterior margin  of the lower outer quadrant.  There was extensive angiolymphatic invasion, but both sentinel lymph nodes on the left were negative.  The patient received left-sided postmastectomy radiation including of the left chest wall and left supraclavicular fossa to a total dose of 50.4 Gy plus a 10 Gy scar boost. She had an Oncotype DX recurrence score of 17, further discussed below. She decided to forego reconstruction. She was tested for BRCA1 and 2 and was found to be negative. Given her overall prognosis, she did not receive chemotherapy, but started tamoxifen February of 2010, with good tolerance.  RECURRENT DISEASE:  SUMMARY OF PRESENTATION  [name] took tamoxifen for 5 years with no evidence of active disease. In May 2015 we decided to stop the tamoxifen because of problems with endometrial hyperplasia. 6 months later, at the November visit, she was found to have a change in a small area of scaring her left inframammary fold. She was referred to dermatology and biopsy 04/21/2014 showed ([id number]) recurrent ductal adenocarcinoma (positive for gross cystic disease fluid protein, estrogen, and progesterone). She then underwent a staging PET scan in [facility] 05/06/2014. This showed a 1.9 cm sclerotic lesion in the right ischial tuberosity, with a maximum standard uptake value of 4.6. A sclerotic lesion in the left side of the L3 vertebral body measuring 1.6 cm had no hypermetabolic activity.  Her subsequent history is as detailed below.   PAST MEDICAL HISTORY: Past Medical History:  Diagnosis Date   Allergic rhinitis due to pollen    Breast cancer ([place]) 2010   Breast cancer ([place]) 2016   reoccurrence in scar   History of radiation therapy 04/26/19- 05/05/19   Right ischium 5 fractions of 10 Gy each to total 50 Gy.    Hypertension    Hypothyroidism    Osteopenia    S/P radiation therapy 07/12/2014 through 08/16/2014                                                        Left chest wall 4000 cGy in 20 sessions, left  chest wall boost 1000 cGy in 5 sessions     Wears glasses   Diverticulosis     PAST SURGICAL HISTORY: Past Surgical History:  Procedure Laterality Date   BREAST LUMPECTOMY Left 06/01/2014   Procedure: WIDE LOCAL EXCISION OF RECURRENT LEFT BREAST CANCER;  Surgeon: Rolm Bookbinder, MD;  Location: [facility];  Service: General;  Laterality: Left;   COLONOSCOPY     cyst removed     left lower jaw - at age 7 yr   [place] N/A 09/16/2013   Procedure: [place];  Surgeon: Princess Bruins, MD;  Location: [facility];  Service: Gynecology;  Laterality: N/A;  1 hr.   INGUINAL HERNIA REPAIR     right side as infant   LAPAROSCOPIC PELVIC LYMPH NODE BIOPSY     MASTECTOMY  2010   bilateral mastectomies-lt snbx-radiation post   MASTECTOMY Bilateral    OOPHORECTOMY  2016   patient denies   Right knee arthroscopy  2008   ROBOTIC ASSISTED BILATERAL SALPINGO OOPHERECTOMY Bilateral 03/13/2015   Procedure: ROBOTIC ASSISTED BILATERAL SALPINGO OOPHORECTOMY With [place];  Surgeon: Princess Bruins, MD;  Location: [facility];  Service: Gynecology;  Laterality: Bilateral;   TONSILLECTOMY  WISDOM TOOTH EXTRACTION      FAMILY HISTORY Family History  Problem Relation Age of Onset   Hypertension Mother    Cancer Mother        Lung Cancer - long history of tobacco use   Hypertension Father    Stroke Maternal Grandfather    Stroke Paternal Grandfather    Hypertension Brother    Colon cancer Neg Hx    Stomach cancer Neg Hx   The patient's mother died in 2694 from complications of lung cancer. The patient has not been in touch with her father her for approximately 30 years. She had no sisters, 1 brother, who is in good health. There is no breast or ovarian cancer in the family to her knowledge.   GYNECOLOGIC HISTORY:  GX P0, menarche at around age 50, the patient continued to have periods despite being on  tamoxifen, although more irregularly. She is now postmenopausal (status post BSO).   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. She lives alone and has no pets. Her work involves quite a bit of travel and she f generally works right through the summer   [place]:  Eleonore Chiquito is HCPOA--she can be reached at [facility]:     Social History   Tobacco Use   Smoking status: Never   Smokeless tobacco: Never  Vaping Use   Vaping Use: Never used  Substance Use Topics   Alcohol use: No    Alcohol/week: 0.0 standard drinks    Comment: rare   Drug use: No      Colonoscopy: December 2013 Coll[facility] Mares)  PAP: Feb 2011  Bone density: 06/2009, normal  Cholesterol: "good"    Allergies  Allergen Reactions   Pollen Extract    Adhesive [Tape] Dermatitis and Rash    Paper tape is better to use    MEDICATIONS:   Current Outpatient Medications  Medication Sig Dispense Refill   acetaminophen (TYLENOL) 500 MG tablet Take 500 mg by mouth every 6 (six) hours as needed for moderate pain or headache.     acyclovir ointment (ZOVIRAX) 5 % Apply 1 application topically every 3 (three) hours. As needed 5 g 5   anastrozole (ARIMIDEX) 1 MG tablet Take 1 tablet (1 mg total) by mouth daily. 90 tablet 4   Cholecalciferol (VITAMIN D3) 2000 UNITS capsule Take 2,000 Units by mouth daily.     Ciclopirox 1 % shampoo Use topically on scalp 120 mL 0   COVID-19 mRNA bivalent vaccine, Pfizer, (PFIZER COVID-19 VAC BIVALENT) injection Inject into the muscle. 0.3 mL 0   COVID-19 mRNA Vac-TriS, Pfizer, (PFIZER-BIONT COVID-19 VAC-TRIS) SUSP injection Inject into the muscle. 0.3 mL 0   fexofenadine (ALLEGRA) 180 MG tablet Take 180 mg by mouth daily as needed for allergies or rhinitis.     fluocinolone (SYNALAR) 0.01 % external solution Apply 1 application topically 2 (two) times daily as needed (irritation).      ibuprofen (ADVIL,MOTRIN) 200 MG tablet Take 200 mg by mouth every 6 (six)  hours as needed for moderate pain.     ketoconazole (NIZORAL) 2 % shampoo Apply 1 application topically 2 (two) times a week.     levothyroxine (SYNTHROID) 75 MCG tablet Take 1 tablet (75 mcg total) by mouth daily before breakfast. 90 tablet 3   losartan (COZAAR) 100 MG tablet Take 1 tablet (100 mg total) by mouth daily. 90 tablet 3   Olopatadine HCl (PATADAY) 0.7 % SOLN Apply to eye.  Olopatadine HCl 0.6 % SOLN Place 2 sprays into both nostrils 2 (two) times daily as needed (allergies).      Polyethyl Glycol-Propyl Glycol (SYSTANE OP) Place 1 drop into both eyes daily as needed (for allergies).      Turmeric 500 MG CAPS Take 500 mg by mouth daily.      No current facility-administered medications for this visit.    OBJECTIVE:  white woman who appears younger than stated age    36:   03/29/21 1556  BP: 139/66  Pulse: 85  Resp: 16  Temp: (!) 97.3 F (36.3 C)  SpO2: 98%       Body mass index is 27 kg/m.   ECOG FS: 1 - Symptomatic but completely ambulatory  Sclerae unicteric, EOMs intact Wearing a mask No cervical or supraclavicular adenopathy Lungs no rales or rhonchi Heart regular rate and rhythm Abd soft, nontender, positive bowel sounds MSK no focal spinal tenderness, no upper extremity lymphedema Neuro: nonfocal, well oriented, appropriate affect Breasts: Status post bilateral mastectomies.  The right chest wall is flat and uncomplicated.  On the left side there is a slightly irregular scar tissue which is unchanged from baseline and also some telangiectasias.  There is no evidence of disease recurrence.  Both axillae are benign.   LABS:           Chemistry      Component Value Date/Time   NA 137 02/15/2021 1422   NA 139 02/28/2017 1521   K 4.6 02/15/2021 1422   K 4.6 02/28/2017 1521   CL 102 02/15/2021 1422   CL 105 08/10/2012 0944   CO2 23 02/15/2021 1422   CO2 29 02/28/2017 1521   BUN 22 (H) 02/15/2021 1422   BUN 20.0 02/28/2017 1521   CREATININE  1.02 (H) 02/15/2021 1422   CREATININE 1.0 02/28/2017 1521      Component Value Date/Time   CALCIUM 9.9 02/15/2021 1422   CALCIUM 10.4 02/28/2017 1521   ALKPHOS 88 02/15/2021 1422   ALKPHOS 62 02/28/2017 1521   AST 19 02/15/2021 1422   AST 18 02/28/2017 1521   ALT 13 02/15/2021 1422   ALT 13 02/28/2017 1521   BILITOT 1.4 (H) 02/15/2021 1422   BILITOT 1.17 02/28/2017 1521         Lab Results  Component Value Date   WBC 7.6 02/15/2021   HGB 14.4 02/15/2021   HCT 42.0 02/15/2021   MCV 86.4 02/15/2021   PLT 232 02/15/2021   NEUTROABS 5.4 02/15/2021    STUDIES  NM PET Image Restag (PS) Skull Base To Thigh  Addendum Date: 03/19/2021   ADDENDUM REPORT: 03/19/2021 11:54 ADDENDUM: Prior exams were further reviewed including PET CT dated February 22, 2019 and prior PET-CT dated May 06, 2014. Lesion of the right ischial tuberosity was present on these prior exams and appears more sclerotic with some associated new lucent areas, these findings are likely due to post treatment change. No osseous FDG activity above background (SUV max 1.5) to suggest active disease. Electronically Signed   By: Yetta Glassman M.D.   On: 03/19/2021 11:54   Result Date: 03/19/2021 CLINICAL DATA:  Follow-up treatment strategy for breast cancer. EXAM: NUCLEAR MEDICINE PET SKULL BASE TO THIGH TECHNIQUE: 7.6 mCi F-18 FDG was injected intravenously. Full-ring PET imaging was performed from the skull base to thigh after the radiotracer. CT data was obtained and used for attenuation correction and anatomic localization. Fasting blood glucose: 90 mg/dl COMPARISON:  PET-CT dated February 22, 2019  FINDINGS: Mediastinal blood pool activity: SUV max 2.5 Liver activity: SUV max 3.4 NECK: No hypermetabolic lymph nodes in the neck. Incidental CT findings: none CHEST: Asymmetric soft tissue of the left chest wall with mild FDG uptake below mediastinal blood pool, SUV max of 1.8, unchanged compared to prior exam and likely  postsurgical. Mild asymmetric FDG uptake is seen in the right axillary lymph nodes, SUV max of 1.9 which is below mediastinal blood pool, likely reactive. No hypermetabolic mediastinal or hilar nodes. No suspicious pulmonary nodules. Incidental CT findings: none ABDOMEN/PELVIS: No abnormal hypermetabolic activity within the liver, pancreas, adrenal glands, or spleen. No hypermetabolic lymph nodes in the abdomen or pelvis. Incidental CT findings: Mild diverticulosis. SKELETON: New sclerotic right ischial tuberosity lesion with no bony hypermetabolic activity, increased FDG uptake is seen in the surrounding soft tissues, likely post treatment changes.No focal hypermetabolic activity to suggest skeletal metastasis. Incidental CT findings: None. IMPRESSION: 1. No evidence of FDG avid metastatic disease in the chest, abdomen or pelvis. 2. New sclerotic right ischial tuberosity lesion with no bony hypermetabolic activity, increased FDG uptake is seen in the surrounding soft tissues, likely post treatment changes. Electronically Signed: By: Yetta Glassman M.D. On: 03/16/2021 10:48     ASSESSMENT: 60 y.o.  BRCA negative [place] woman ,  (1) status post bilateral mastectomies January 2010 for a left-sided upper outer quadrant T2 N0 (stage IIA) invasive ductal carcinoma, grade 1, strongly estrogen and progesterone receptor positive, HER-2 nonamplified,   (2) Oncotype DX recurrence score of 17, predicting a 10-12% distant recurrence risk over 10 years if her only adjuvant treatment is tamoxifen for 5 years  (3) status post Left postmastectomy radiation including Left supraclavicular fossa (5040 cGy in 28 sessions)  (4) on tamoxifen starting February of 2010, stopped May 2015 because of endometrial hyperplasia  (5) osteopenia-- T score -1.2 on repeated bone density scans 09/24/2011 and 12/12/2013  (a) bone density 01/30/2018 showed a T score of +0.1  (b) bone density 02/11/2020 showed a T score of  -0.4   RECURRENT DISEASE (6) skin biopsy of a lesion under the left breast scar 04/21/2014 shows adenocarcinoma, gross cystic disease fluid protein, estrogen and progesterone receptor positive  (a) s/p wide excision with negative though close margins 06/01/2014, prognostic panel pending  (b) radiation to Left chest wall 4000 cGy in 20 sessions, left s chest wall boost 1000 cGy in 5 session  07/12/2014 through 08/16/2014       (7) PET scan 05/06/2014 and bone scan show only one additional area of concern, in the right ischiial tuberosity--  (a) biopsy of the right ischial tuberosity 05/24/2014 showed no evidence of malignancy  (b) zolendronate started 06/06/2014, poorly tolerated  (c) denosumab/ Xgeva started 09/26/2014, switched to Prolia as of May 2017  (d) repeat bone scan 12/20/2014 shows no new/additional areas of disease  (e) restaging studies 07/07/2015 do not support a diagnosis of metastatic disease.  (f) MRI of the pelvis 04/13/2016 and 03/19/2018 shows the lesion in the right ischial tuberosity to be unchanged  (g) pelvic MRI 02/12/2019 shows slight growth and possible increased activity of the ischial lesion  (h) PET scan 02/22/2019 shows no other areas of uptake.  The right ischial lesion area has possibly slightly progressed as compared to prior  (i) repeat biopsy of the right ischial lesion shows very minute clusters of atypical epithelioid cells highlighted by cytokeratin AE1/AE3 and 8/18, GA TA 3+, with no gross cystic disease fluid protein positivity, and estrogen and progesterone receptor  negative: this is not metastatic breast cancer  (8) goserelin monthly started 05/09/2016, last dose 02/20/2015  (a) s/p BSO 03/13/2015 with benign pathology  (9) anastrozole started  08/29/2014  (a) bone density 12/13/2015 shows improvement, with the lumbar spine T score now at 0.1, the femoral neck -1.1, both increased  (b) continues on denosumab/Prolia most recent dose 09/05/2017  (c)  repeat bone density 01/30/2018 shows a T score of 0.1  (d) denosumab/Prolia discontinued after 09/05/2017 dose  (10) status post  SBRT treatment /07/2019 through 05/05/2019 to the right ischial tuberosity lesion  (a) no uptake at the area in question on bone scan 10/21/2019  (b) MRI of the pelvis 02/16/2020 shows significant improvement in the right ischial tuberosity lesion  (c) MRI of the pelvis 02/09/2021 shows slight increase in the right ischial tuberosity lesion  (d) PET scan 03/14/2021 shows no activity in the area in question (SUV less then mediastinal blood pool), slight change in appearance attributed to posttreatment remodeling.  (11) denosumab/Xgeva resumed 05/13/2019, repeated every 12 weeks  (a) discontinued after 02/16/2020 dose  (b) denosumab/Prolia resumed April 2022    PLAN: [name] is now 7 years out from evidence of locally recurrent disease.  There has not been any evidence of metastatic disease.  We have been chasing a right ischial tuberosity lesion which is certainly not breast cancer whenever it is.  This has been irradiated and all we are seeing is the effects of current continued remodeling, and normal process in the body.  There is absolutely no uptake on PET scan in the area.  This is all very reassuring.  She is going to see [place] again May 2023.  At that point she will go off anastrozole, having completed 7 years, and likely also go off Prolia.  I think she will benefit from continued yearly visits with [place] fairly indefinitely and certainly if any new developments occur she knows that we will be evaluated those aggressively.  Total encounter time 25 minutes.*   Mckenzy Salazar, [name], MD  03/29/21 4:39 PM Medical Oncology and Hematology [facility] [place], [place] [address] Tel. [phone]    Fax. [phone]    I, Wilburn Mylar, am acting as scribe for Dr. [name]. Jeslin Bazinet.  I, Lurline Del MD, have reviewed the above  documentation for accuracy and completeness, and I agree with the above.   *Total Encounter Time as defined by the Centers for Medicare and Medicaid Services includes, in addition to the face-to-face time of a patient visit (documented in the note above) non-face-to-face time: obtaining and reviewing outside history, ordering and reviewing medications, tests or procedures, care coordination (communications with other health care professionals or caregivers) and documentation in the medical record.

## 2021-03-29 ENCOUNTER — Other Ambulatory Visit: Payer: Self-pay

## 2021-03-29 ENCOUNTER — Inpatient Hospital Stay: Payer: BC Managed Care – PPO | Attending: Oncology | Admitting: Oncology

## 2021-03-29 VITALS — BP 139/66 | HR 85 | Temp 97.3°F | Resp 16 | Ht 64.0 in | Wt 157.3 lb

## 2021-03-29 DIAGNOSIS — M8580 Other specified disorders of bone density and structure, unspecified site: Secondary | ICD-10-CM | POA: Diagnosis not present

## 2021-03-29 DIAGNOSIS — C50412 Malignant neoplasm of upper-outer quadrant of left female breast: Secondary | ICD-10-CM | POA: Insufficient documentation

## 2021-03-29 DIAGNOSIS — Z79899 Other long term (current) drug therapy: Secondary | ICD-10-CM | POA: Insufficient documentation

## 2021-03-29 DIAGNOSIS — C50912 Malignant neoplasm of unspecified site of left female breast: Secondary | ICD-10-CM | POA: Diagnosis not present

## 2021-03-29 DIAGNOSIS — Z170 Estrogen receptor positive status [ER+]: Secondary | ICD-10-CM | POA: Diagnosis not present

## 2021-03-29 DIAGNOSIS — C7951 Secondary malignant neoplasm of bone: Secondary | ICD-10-CM | POA: Diagnosis not present

## 2021-04-05 ENCOUNTER — Telehealth: Payer: Self-pay | Admitting: Hematology and Oncology

## 2021-04-05 ENCOUNTER — Encounter: Payer: Self-pay | Admitting: Oncology

## 2021-04-05 NOTE — Telephone Encounter (Signed)
Sch per 12/8 los, pt aware 

## 2021-04-09 ENCOUNTER — Ambulatory Visit: Payer: BC Managed Care – PPO | Admitting: Obstetrics & Gynecology

## 2021-04-11 ENCOUNTER — Encounter: Payer: Self-pay | Admitting: Oncology

## 2021-04-12 ENCOUNTER — Ambulatory Visit (INDEPENDENT_AMBULATORY_CARE_PROVIDER_SITE_OTHER): Payer: BC Managed Care – PPO | Admitting: Obstetrics & Gynecology

## 2021-04-12 ENCOUNTER — Encounter: Payer: Self-pay | Admitting: Obstetrics & Gynecology

## 2021-04-12 ENCOUNTER — Other Ambulatory Visit: Payer: Self-pay

## 2021-04-12 VITALS — BP 110/72 | HR 82 | Ht 64.75 in | Wt 155.0 lb

## 2021-04-12 DIAGNOSIS — M85851 Other specified disorders of bone density and structure, right thigh: Secondary | ICD-10-CM | POA: Diagnosis not present

## 2021-04-12 DIAGNOSIS — Z780 Asymptomatic menopausal state: Secondary | ICD-10-CM | POA: Diagnosis not present

## 2021-04-12 DIAGNOSIS — Z01419 Encounter for gynecological examination (general) (routine) without abnormal findings: Secondary | ICD-10-CM | POA: Diagnosis not present

## 2021-04-12 DIAGNOSIS — Z170 Estrogen receptor positive status [ER+]: Secondary | ICD-10-CM

## 2021-04-12 DIAGNOSIS — C50412 Malignant neoplasm of upper-outer quadrant of left female breast: Secondary | ICD-10-CM

## 2021-04-12 NOTE — Progress Notes (Signed)
Erin [name] 1960-07-24 [id number]   History:    60 y.o. G0 Single   RP:  Established patient presenting for annual gyn exam    HPI: Surgical menopause status post laparoscopic bilateral salpingo-oophorectomy November 2016.  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  Status post bilateral mastectomy. A PET scan in May 06, 2014 showed a 1.9 cm sclerotic lesion in the right ischial tuberosity.  Followed by MRIs of the pelvis. MRI 01/2021 Increase in size of the ischial tuberosity lesion, probably post Radiotherapy changes.  PET scan Negative. She is on Arimidex.  Her bone density showed improvement of osteopenia on Prolia 01/2018, repeat BD 01/2020 Osteopenia Rt Fem Neck T-Score -1.4.  No current pelvic pain.  No postmenopausal bleeding.  Abstinent currently. Very mild menopausal symptoms.  Urine and bowel movements normal.  BMI 25.99.  Physically active.   Past medical history,surgical history, family history and social history were all reviewed and documented in the EPIC chart.  Gynecologic History No LMP recorded (lmp unknown). Patient is postmenopausal.  Obstetric History OB History  Gravida Para Term Preterm AB Living  0 0 0 0 0 0  SAB IAB Ectopic Multiple Live Births  0 0 0 0 0     ROS: A ROS was performed and pertinent positives and negatives are included in the history.  GENERAL: No fevers or chills. HEENT: No change in vision, no earache, sore throat or sinus congestion. NECK: No pain or stiffness. CARDIOVASCULAR: No chest pain or pressure. No palpitations. PULMONARY: No shortness of breath, cough or wheeze. GASTROINTESTINAL: No abdominal pain, nausea, vomiting or diarrhea, melena or bright red blood per rectum. GENITOURINARY: No urinary frequency, urgency, hesitancy or dysuria. MUSCULOSKELETAL: No joint or muscle pain,  no back pain, no recent trauma. DERMATOLOGIC: No rash, no itching, no lesions. ENDOCRINE: No polyuria, polydipsia, no heat or cold intolerance. No recent change in weight. HEMATOLOGICAL: No anemia or easy bruising or bleeding. NEUROLOGIC: No headache, seizures, numbness, tingling or weakness. PSYCHIATRIC: No depression, no loss of interest in normal activity or change in sleep pattern.     Exam:   BP 110/72    Pulse 82    Ht 5' 4.75" (1.645 m)    Wt 155 lb (70.3 kg)    LMP  (LMP Unknown)    SpO2 97%    BMI 25.99 kg/m   Body mass index is 25.99 kg/m.  General appearance : Well developed well nourished female. No acute distress HEENT: Eyes: no retinal hemorrhage or exudates,  Neck supple, trachea midline, no carotid bruits, no thyroidmegaly Lungs: Clear to auscultation, no rhonchi or wheezes, or rib retractions  Heart: Regular rate and rhythm, no murmurs or gallops Breast:Examined in sitting and supine position were symmetrical in appearance, no palpable masses or tenderness,  no skin retraction, no nipple inversion, no nipple discharge, no skin discoloration, no axillary or supraclavicular lymphadenopathy Abdomen: no palpable masses or tenderness, no rebound or guarding Extremities: no edema or skin discoloration or tenderness  Pelvic: Vulva: Normal             Vagina: No gross lesions or discharge  Cervix: No gross lesions or discharge  Uterus  AV, normal size, shape and consistency, non-tender and mobile  Adnexa  Without masses or tenderness  Anus: Normal   Assessment/Plan:  60 y.o. female for annual exam   1.  Well female exam with routine gynecological exam Surgical menopause status post laparoscopic bilateral salpingo-oophorectomy November 2016.  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  Status post bilateral mastectomy. A PET  scan in May 06, 2014 showed a 1.9 cm sclerotic lesion in the right ischial tuberosity.  Followed by MRIs of the pelvis. MRI 01/2021 Increase in size of the ischial tuberosity lesion, probably post Radiotherapy changes.  PET scan Negative. She is on Arimidex.  Her bone density showed improvement of osteopenia on Prolia 01/2018, repeat BD 01/2020 Osteopenia Rt Fem Neck T-Score -1.4.  No current pelvic pain.  No postmenopausal bleeding.  Abstinent currently. Very mild menopausal symptoms.  Urine and bowel movements normal.  BMI 25.99.  Physically active. Pap Neg 03/2020.  Will repeat Pap next year.  2. Postmenopausal Surgical menopause status post laparoscopic bilateral salpingo-oophorectomy November 2016.  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    3. Osteopenia of neck of right femur Bone density showed improvement of osteopenia on Prolia 01/2018, repeat BD 01/2020 Osteopenia Rt Fem Neck T-Score -1.4.  Continue Prolia at this time.  Repeat BD 01/2022.  4. Malignant neoplasm of upper-outer quadrant of left breast in female, estrogen receptor positive ([place]) S/P Bilateral Mastectomy.  Planning to stop Arimidex in 2023.  Will be followed by Dr Lindi Adie, as Dr [name] is retiring.  Other orders - Ascorbic Acid (VITAMIN C PO); Take by mouth. - Omega-3 Fatty Acids (FISH OIL PO); Take by mouth. - denosumab (PROLIA) 60 MG/ML SOSY injection; Inject 60 mg into the skin every 6 (six) months.   Princess Bruins MD, 10:06 AM 04/12/2021

## 2021-04-15 IMAGING — PT NM PET TUM IMG RESTAG (PS) SKULL BASE T - THIGH
1 of 8 series · 1 of 25 positions shown · non-contrast
Comparison: None.

CLINICAL DATA: Subsequent treatment strategy for breast cancer.

EXAM:
NUCLEAR MEDICINE PET SKULL BASE TO THIGH
TECHNIQUE: 8.5 mCi F-18 FDG was injected intravenously. Full-ring PET imaging
was performed from the skull base to thigh after the radiotracer. CT
data was obtained and used for attenuation correction and anatomic
localization.
Fasting blood glucose: 92 mg/dl

[Series 4: ct sk_thigh 5.0 b31f · axial · 5.0mm · 0.98mm/px · 1 of 225 slices shown]
[im 225/225  brain]
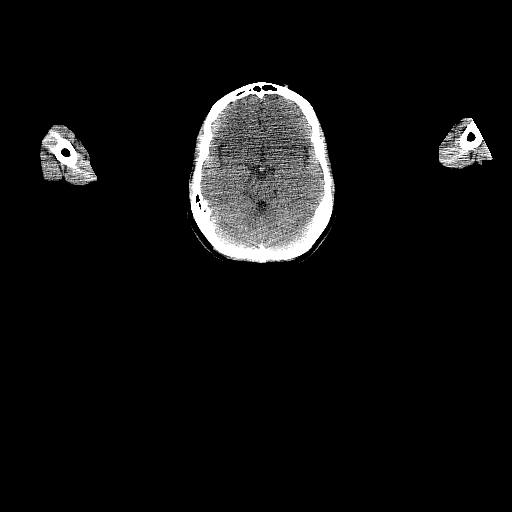

[1 of 25 positions shown; findings below may reference images not displayed]

FINDINGS: Mediastinal blood pool activity: SUV max

Liver activity: SUV max NA

NECK: No hypermetabolic lymph nodes in the neck.

Incidental CT findings: none

CHEST: No hypermetabolic mediastinal or hilar nodes. No suspicious
pulmonary nodules on the CT scan.

Incidental CT findings: none

ABDOMEN/PELVIS: No abnormal hypermetabolic activity within the
liver, pancreas, adrenal glands, or spleen. No hypermetabolic lymph
nodes in the abdomen or pelvis.

Incidental CT findings: none

SKELETON: Corresponding to the MRI abnormality is a focal area of
increased radiotracer uptake localizing to the right ischial
tuberosity. SUV max is equal to 5.76. Suspicious for bone
metastases. This is not significantly changed when compared with the
PET-CT from 05/06/2014.

Incidental CT findings: none
IMPRESSION: 1. Persistent increased radiotracer uptake localizing to the right
ischial tuberosity concerning for bone metastases. When compared
with previous PET-CT the degree of FDG uptake is not significantly
changed in the interval.
2. No new foci of increased uptake identified to suggest progression
of disease.

## 2021-05-18 ENCOUNTER — Encounter: Payer: BC Managed Care – PPO | Admitting: Internal Medicine

## 2021-05-18 ENCOUNTER — Other Ambulatory Visit: Payer: Self-pay

## 2021-05-18 ENCOUNTER — Other Ambulatory Visit: Payer: Self-pay | Admitting: Internal Medicine

## 2021-05-18 DIAGNOSIS — E038 Other specified hypothyroidism: Secondary | ICD-10-CM

## 2021-06-20 ENCOUNTER — Other Ambulatory Visit: Payer: Self-pay

## 2021-06-20 ENCOUNTER — Encounter: Payer: Self-pay | Admitting: Internal Medicine

## 2021-06-20 ENCOUNTER — Ambulatory Visit (INDEPENDENT_AMBULATORY_CARE_PROVIDER_SITE_OTHER): Payer: BC Managed Care – PPO | Admitting: Internal Medicine

## 2021-06-20 VITALS — BP 122/62 | HR 75 | Resp 18 | Ht 64.75 in | Wt 153.4 lb

## 2021-06-20 DIAGNOSIS — Z0000 Encounter for general adult medical examination without abnormal findings: Secondary | ICD-10-CM | POA: Diagnosis not present

## 2021-06-20 DIAGNOSIS — M8580 Other specified disorders of bone density and structure, unspecified site: Secondary | ICD-10-CM

## 2021-06-20 DIAGNOSIS — I10 Essential (primary) hypertension: Secondary | ICD-10-CM

## 2021-06-20 DIAGNOSIS — C50912 Malignant neoplasm of unspecified site of left female breast: Secondary | ICD-10-CM

## 2021-06-20 DIAGNOSIS — L219 Seborrheic dermatitis, unspecified: Secondary | ICD-10-CM

## 2021-06-20 DIAGNOSIS — E038 Other specified hypothyroidism: Secondary | ICD-10-CM

## 2021-06-20 MED ORDER — KETOCONAZOLE 2 % EX SHAM: 1.0000 "application " | MEDICATED_SHAMPOO | CUTANEOUS | 0 refills | Status: DC

## 2021-06-20 MED ORDER — CICLOPIROX 1 % EX SHAM: MEDICATED_SHAMPOO | CUTANEOUS | 0 refills | Status: AC

## 2021-06-20 MED ORDER — SELENIUM SULFIDE 1 % EX LOTN: 1.0000 "application " | TOPICAL_LOTION | Freq: Every day | CUTANEOUS | 11 refills | Status: DC

## 2021-06-20 NOTE — Progress Notes (Signed)
? ?  Subjective:  ? ?Patient ID: Erin [name], female    DOB: 30-May-1960, 61 y.o.   MRN: [id number] ? ?HPI ?The patient is  61 YO female coming in for physical.  ? ?PMH, [facility], social history reviewed and updated ? ?Review of Systems  ?Constitutional: Negative.   ?HENT: Negative.    ?Eyes: Negative.   ?Respiratory:  Negative for cough, chest tightness and shortness of breath.   ?Cardiovascular:  Negative for chest pain, palpitations and leg swelling.  ?Gastrointestinal:  Negative for abdominal distention, abdominal pain, constipation, diarrhea, nausea and vomiting.  ?Musculoskeletal: Negative.   ?Skin:  Positive for rash.  ?Neurological: Negative.   ?Psychiatric/Behavioral: Negative.    ? ?Objective:  ?Physical Exam ?Constitutional:   ?   Appearance: She is well-developed.  ?HENT:  ?   Head: Normocephalic and atraumatic.  ?Cardiovascular:  ?   Rate and Rhythm: Normal rate and regular rhythm.  ?Pulmonary:  ?   Effort: Pulmonary effort is normal. No respiratory distress.  ?   Breath sounds: Normal breath sounds. No wheezing or rales.  ?Abdominal:  ?   General: Bowel sounds are normal. There is no distension.  ?   Palpations: Abdomen is soft.  ?   Tenderness: There is no abdominal tenderness. There is no rebound.  ?Musculoskeletal:  ?   Cervical back: Normal range of motion.  ?Skin: ?   General: Skin is warm and dry.  ?   Findings: Rash present.  ?Neurological:  ?   Mental Status: She is alert and oriented to person, place, and time.  ?   Coordination: Coordination normal.  ? ? ?Vitals:  ? 06/20/21 1035  ?BP: 122/62  ?Pulse: 75  ?Resp: 18  ?SpO2: 98%  ?Weight: 153 lb 6.4 oz (69.6 kg)  ?Height: 5' 4.75" (1.645 m)  ? ?This visit occurred during the SARS-CoV-2 public health emergency.  Safety protocols were in place, including screening questions prior to the visit, additional usage of staff PPE, and extensive cleaning of exam room while observing appropriate contact time as indicated for disinfecting solutions.   ? ?Assessment & Plan:  ? ?

## 2021-06-20 NOTE — Patient Instructions (Signed)
[facility] for dermatology. ? ? ?

## 2021-06-21 ENCOUNTER — Other Ambulatory Visit (INDEPENDENT_AMBULATORY_CARE_PROVIDER_SITE_OTHER): Payer: BC Managed Care – PPO

## 2021-06-21 DIAGNOSIS — C50912 Malignant neoplasm of unspecified site of left female breast: Secondary | ICD-10-CM | POA: Diagnosis not present

## 2021-06-21 DIAGNOSIS — I10 Essential (primary) hypertension: Secondary | ICD-10-CM | POA: Diagnosis not present

## 2021-06-21 DIAGNOSIS — E038 Other specified hypothyroidism: Secondary | ICD-10-CM

## 2021-06-21 LAB — T4, FREE: Free T4: 1.2 ng/dL (ref 0.60–1.60)

## 2021-06-21 LAB — LIPID PANEL
Cholesterol: 145 mg/dL (ref 0–200)
HDL: 54.2 mg/dL (ref >39.00)
LDL Cholesterol: 79 mg/dL (ref 0–99)
NonHDL: 90.98
Total CHOL/HDL Ratio: 3
Triglycerides: 59 mg/dL (ref 0.0–149.0)
VLDL: 11.8 mg/dL (ref 0.0–40.0)

## 2021-06-21 LAB — TSH: TSH: 1.54 u[IU]/mL (ref 0.35–5.50)

## 2021-06-21 LAB — VITAMIN D 25 HYDROXY (VIT D DEFICIENCY, FRACTURES): VITD: 40.8 ng/mL (ref 30.00–100.00)

## 2021-06-22 DIAGNOSIS — L219 Seborrheic dermatitis, unspecified: Secondary | ICD-10-CM | POA: Insufficient documentation

## 2021-06-22 NOTE — Assessment & Plan Note (Signed)
Flu shot up to date. Covid-19 up to date. Shingrix complete. Tetanus up to date. Colonoscopy up to date. Mammogram up to date, pap smear up to date and dexa up to date ordered for fall 2023. Counseled about sun safety and mole surveillance. Counseled about the dangers of distracted driving. Given 10 year screening recommendations.  ? ?

## 2021-06-22 NOTE — Assessment & Plan Note (Signed)
Ordered DEXA for fall 2023 and depending on results may change treatment. She is currently on arimidex and will be stopping soon.  ?

## 2021-06-22 NOTE — Assessment & Plan Note (Signed)
Reviewed recent CMP and no changes needed to regimen. Will continue losartan 100 mg daily. If any low BP episodes she will check BP and let us know.  ?

## 2021-06-22 NOTE — Assessment & Plan Note (Signed)
Rx ketoconazole shampoo and ciclopirox shampoo and selenium sulfide shampoo to use alternating months to help with scalp and upper forehead lesions. We did again discuss how this is hard to eliminate.  ?

## 2021-06-22 NOTE — Assessment & Plan Note (Signed)
Checking TSH and free T4 and adjust synthroid 75 mcg daily as needed. No symptoms of over or under replacement.  ?

## 2021-08-14 NOTE — Progress Notes (Signed)
? ?Patient Care Team: ?[name], MD as PCP - General (Internal Medicine) ?[name], [name], MD (Inactive) as Consulting Physician (Oncology) ?Rolm Bookbinder, MD as Consulting Physician (General Surgery) ?Princess Bruins, MD as Consulting Physician (Obstetrics and Gynecology) ?Pyrtle, Lajuan Lines, MD as Consulting Physician (Gastroenterology) ?Star Age, MD as Attending Physician (Neurology) ?Augustina Mood, DDS as Referring Physician (Dentistry) ?Lyndal Pulley, DO as Consulting Physician (Family Medicine) ?Jari Pigg, MD as Consulting Physician (Dermatology) ?Eppie Gibson, MD as Attending Physician (Radiation Oncology) ?Augustina Mood, DDS as Referring Physician (Dentistry) ? ?DIAGNOSIS:  ?Encounter Diagnosis  ?Name Primary?  ? Malignant neoplasm of upper-outer quadrant of left breast in female, estrogen receptor positive ([place])   ? ? ?CHIEF COMPLIANT: Recurrent breast cancer (s/p bilateral mastectomies) ?  ? ?INTERVAL HISTORY: Erin [name] is a 61 y.o. with the above mention Recurrent breast cancer (s/p bilateral mastectomies) ? She presents to the clinic today for a follow-up. She states she has some dry eyes.  ?She has completed 7 years of anastrozole therapy and is interested in discussing the plan for the future. ?She is interested to find out what kind of test we can do for monitoring for surveillance of breast cancer.  She has also been on Prolia injections every 6 months and wanted to know what we should do about those today. ? ? ?ALLERGIES:  is allergic to pollen extract and adhesive [tape]. ? ?MEDICATIONS:  ?Current Outpatient Medications  ?Medication Sig Dispense Refill  ? acyclovir ointment (ZOVIRAX) 5 % Apply 1 application topically every 3 (three) hours. As needed 5 g 5  ? anastrozole (ARIMIDEX) 1 MG tablet Take 1 tablet (1 mg total) by mouth daily. 90 tablet 4  ? Ascorbic Acid (VITAMIN C PO) Take by mouth.    ? Cholecalciferol (VITAMIN D3) 2000 UNITS capsule Take 2,000  Units by mouth daily.    ? Ciclopirox 1 % shampoo Use topically on scalp 120 mL 0  ? denosumab (PROLIA) 60 MG/ML SOSY injection Inject 60 mg into the skin every 6 (six) months.    ? fexofenadine (ALLEGRA) 180 MG tablet Take 180 mg by mouth daily as needed for allergies or rhinitis.    ? ibuprofen (ADVIL,MOTRIN) 200 MG tablet Take 200 mg by mouth every 6 (six) hours as needed for moderate pain.    ? ketoconazole (NIZORAL) 2 % shampoo Apply 1 application topically 2 (two) times a week. 120 mL 0  ? levothyroxine (SYNTHROID) 75 MCG tablet TAKE 1 TABLET(75 MCG) BY MOUTH DAILY BEFORE BREAKFAST 90 tablet 3  ? losartan (COZAAR) 100 MG tablet TAKE 1 TABLET(100 MG) BY MOUTH DAILY 90 tablet 3  ? Olopatadine HCl (PATADAY) 0.7 % SOLN Apply to eye.    ? Olopatadine HCl 0.6 % SOLN Place 2 sprays into both nostrils 2 (two) times daily as needed (allergies).     ? Omega-3 Fatty Acids (FISH OIL PO) Take by mouth.    ? Polyethyl Glycol-Propyl Glycol (SYSTANE OP) Place 1 drop into both eyes daily as needed (for allergies).     ? selenium sulfide (SELSUN) 1 % LOTN Apply 1 application topically daily. 207 mL 11  ? Turmeric 500 MG CAPS Take 500 mg by mouth daily.     ? ?No current facility-administered medications for this visit.  ? ? ?PHYSICAL EXAMINATION: ?ECOG PERFORMANCE STATUS: 1 - Symptomatic but completely ambulatory ? ?Vitals:  ? 08/28/21 1447  ?BP: (!) 142/80  ?Pulse: 70  ?Resp: 19  ?Temp: (!) 97.4 ?F (36.3 ?C)  ?SpO2:  100%  ? ?Filed Weights  ? 08/28/21 1447  ?Weight: 151 lb 9.6 oz (68.8 kg)  ? ? ?BREAST: Bilateral mastectomies without any palpable lumps or nodules in the chest wall or axilla (exam performed in the presence of a chaperone) ? ?LABORATORY DATA:  ?I have reviewed the data as listed ? ?  Latest Ref Rng & Units 08/28/2021  ?  2:26 PM 02/15/2021  ?  2:22 PM 08/03/2020  ?  1:41 PM  ?CMP  ?Glucose 70 - 99 mg/dL 103   124   90    ?BUN 6 - 20 mg/dL $Remove'22   22   21    'gnUyCpB$ ?Creatinine 0.44 - 1.00 mg/dL 0.99   1.02   0.87    ?Sodium  135 - 145 mmol/L 135   137   136    ?Potassium 3.5 - 5.1 mmol/L 4.3   4.6   4.3    ?Chloride 98 - 111 mmol/L 101   102   102    ?CO2 22 - 32 mmol/L $RemoveB'30   23   27    'lGMQVpWG$ ?Calcium 8.9 - 10.3 mg/dL 9.7   9.9   9.6    ?Total Protein 6.5 - 8.1 g/dL 7.0   7.2   6.7    ?Total Bilirubin 0.3 - 1.2 mg/dL 1.1   1.4   1.1    ?Alkaline Phos 38 - 126 U/L 61   88   70    ?AST 15 - 41 U/L $Remo'17   19   17    'wtZcQ$ ?ALT 0 - 44 U/L $Remo'10   13   11    'tFDFl$ ? ? ?Lab Results  ?Component Value Date  ? WBC 6.4 08/28/2021  ? HGB 14.2 08/28/2021  ? HCT 41.1 08/28/2021  ? MCV 87.8 08/28/2021  ? PLT 210 08/28/2021  ? NEUTROABS 4.2 08/28/2021  ? ? ?ASSESSMENT & PLAN:  ?Malignant neoplasm of upper-outer quadrant of left breast in female, estrogen receptor positive ([place]) ?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 ?Oncotype DX: 17, 10 to 12% distant recurrence risk over 10 years ?Post mastectomy radiation ?February 2010-May 2015: Tamoxifen stopped because of endometrial hyperplasia ?04/21/2014: Recurrence: Skin biopsy adenocarcinoma ER/PR positive ?06/01/2014: Wide excision with negative margins ?07/12/2014-08/16/2014: Chest wall radiation ?05/06/2014: PET/CT: Activity in the right ischial tuberosity: Biopsy negative ?Repeat biopsy: Negative ?05/09/2014-02/20/2015: Goserelin ?03/13/2015: BSO: Benign ?04/26/2019-05/05/2019: SBRT to right ischial tuberosity lesion ?08/29/2014-08/28/2021: Anastrozole stopped after 7 years ? ?Current treatment: Anastrozole started 08/29/2014 along with Prolia for osteoporosis ?Recommended that she discontinue anastrozole at this time. ?We also recommended discontinuation of Prolia injections. ? ?Surveillance: [facility] blood testing for circulating cell free DNA. ? ?Next bone density to be done November 2023: ? ?Follow-up in 6 months with long-term survivorship ?She can decide after that whether she wants to follow with [place] every 6 months or once a year. ? ? ? ?No orders of the defined types were  placed in this encounter. ? ?The patient has a good understanding of the overall plan. she agrees with it. she will call with any problems that may develop before the next visit here. ?Total time spent: 30 mins including face to face time and time spent for planning, charting and co-ordination of care ? ? Harriette Ohara, MD ?08/28/21 ? ? ? I Gardiner Coins am scribing for Dr. Lindi Adie ? ?I have reviewed the above documentation for accuracy and completeness, and I agree with the above. ?  ?

## 2021-08-21 ENCOUNTER — Other Ambulatory Visit: Payer: Self-pay | Admitting: Internal Medicine

## 2021-08-21 DIAGNOSIS — E038 Other specified hypothyroidism: Secondary | ICD-10-CM

## 2021-08-24 ENCOUNTER — Other Ambulatory Visit: Payer: Self-pay | Admitting: *Deleted

## 2021-08-24 DIAGNOSIS — C50412 Malignant neoplasm of upper-outer quadrant of left female breast: Secondary | ICD-10-CM

## 2021-08-28 ENCOUNTER — Inpatient Hospital Stay: Payer: BC Managed Care – PPO | Admitting: Hematology and Oncology

## 2021-08-28 ENCOUNTER — Inpatient Hospital Stay: Payer: BC Managed Care – PPO | Attending: Hematology and Oncology

## 2021-08-28 ENCOUNTER — Inpatient Hospital Stay: Payer: BC Managed Care – PPO

## 2021-08-28 ENCOUNTER — Other Ambulatory Visit: Payer: Self-pay

## 2021-08-28 DIAGNOSIS — Z79899 Other long term (current) drug therapy: Secondary | ICD-10-CM | POA: Diagnosis not present

## 2021-08-28 DIAGNOSIS — C50412 Malignant neoplasm of upper-outer quadrant of left female breast: Secondary | ICD-10-CM

## 2021-08-28 DIAGNOSIS — Z170 Estrogen receptor positive status [ER+]: Secondary | ICD-10-CM | POA: Diagnosis not present

## 2021-08-28 DIAGNOSIS — Z853 Personal history of malignant neoplasm of breast: Secondary | ICD-10-CM | POA: Diagnosis not present

## 2021-08-28 DIAGNOSIS — R6889 Other general symptoms and signs: Secondary | ICD-10-CM | POA: Insufficient documentation

## 2021-08-28 DIAGNOSIS — M810 Age-related osteoporosis without current pathological fracture: Secondary | ICD-10-CM | POA: Insufficient documentation

## 2021-08-28 DIAGNOSIS — Z9013 Acquired absence of bilateral breasts and nipples: Secondary | ICD-10-CM | POA: Diagnosis not present

## 2021-08-28 LAB — CMP (CANCER CENTER ONLY)
ALT: 10 U/L (ref 0–44)
AST: 17 U/L (ref 15–41)
Albumin: 4.1 g/dL (ref 3.5–5.0)
Alkaline Phosphatase: 61 U/L (ref 38–126)
Anion gap: 4 — ABNORMAL LOW (ref 5–15)
BUN: 22 mg/dL — ABNORMAL HIGH (ref 6–20)
CO2: 30 mmol/L (ref 22–32)
Calcium: 9.7 mg/dL (ref 8.9–10.3)
Chloride: 101 mmol/L (ref 98–111)
Creatinine: 0.99 mg/dL (ref 0.44–1.00)
GFR, Estimated: >60 mL/min (ref >60)
Glucose, Bld: 103 mg/dL — ABNORMAL HIGH (ref 70–99)
Potassium: 4.3 mmol/L (ref 3.5–5.1)
Sodium: 135 mmol/L (ref 135–145)
Total Bilirubin: 1.1 mg/dL (ref 0.3–1.2)
Total Protein: 7 g/dL (ref 6.5–8.1)

## 2021-08-28 LAB — CBC WITH DIFFERENTIAL (CANCER CENTER ONLY)
Abs Immature Granulocytes: 0.01 10*3/uL (ref 0.00–0.07)
Basophils Absolute: 0 10*3/uL (ref 0.0–0.1)
Basophils Relative: 1 %
Eosinophils Absolute: 0.1 10*3/uL (ref 0.0–0.5)
Eosinophils Relative: 1 %
HCT: 41.1 % (ref 36.0–46.0)
Hemoglobin: 14.2 g/dL (ref 12.0–15.0)
Immature Granulocytes: 0 %
Lymphocytes Relative: 25 %
Lymphs Abs: 1.6 10*3/uL (ref 0.7–4.0)
MCH: 30.3 pg (ref 26.0–34.0)
MCHC: 34.5 g/dL (ref 30.0–36.0)
MCV: 87.8 fL (ref 80.0–100.0)
Monocytes Absolute: 0.5 10*3/uL (ref 0.1–1.0)
Monocytes Relative: 7 %
Neutro Abs: 4.2 10*3/uL (ref 1.7–7.7)
Neutrophils Relative %: 66 %
Platelet Count: 210 10*3/uL (ref 150–400)
RBC: 4.68 MIL/uL (ref 3.87–5.11)
RDW: 11.7 % (ref 11.5–15.5)
WBC Count: 6.4 10*3/uL (ref 4.0–10.5)
nRBC: 0 % (ref 0.0–0.2)

## 2021-08-28 NOTE — Assessment & Plan Note (Signed)
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 ?Oncotype DX: 17, 10 to 12% distant recurrence risk over 10 years ?Post mastectomy radiation ?February 2010-May 2015: Tamoxifen stopped because of endometrial hyperplasia ?04/21/2014: Recurrence: Skin biopsy adenocarcinoma ER/PR positive ?06/01/2014: Wide excision with negative margins ?07/12/2014-08/16/2014: Chest wall radiation ?05/06/2014: PET/CT: Activity in the right ischial tuberosity: Biopsy negative ?Repeat biopsy: Negative ?05/09/2014-02/20/2015: Goserelin ?03/13/2015: BSO: Benign ?04/26/2019-05/05/2019: SBRT to right ischial tuberosity lesion ? ?Current treatment: Anastrozole started 08/29/2014 along with Prolia for osteoporosis ?Discussed the pros and cons of continuing versus discontinuing anastrozole therapy. ? ?Follow-up once a year with long-term survivorship ? ?

## 2021-08-29 NOTE — Progress Notes (Signed)
Successfully faxed [facility] [phone] ?

## 2021-11-01 ENCOUNTER — Encounter (HOSPITAL_COMMUNITY): Payer: Self-pay

## 2022-01-16 ENCOUNTER — Ambulatory Visit (INDEPENDENT_AMBULATORY_CARE_PROVIDER_SITE_OTHER): Payer: BC Managed Care – PPO

## 2022-01-16 DIAGNOSIS — Z23 Encounter for immunization: Secondary | ICD-10-CM

## 2022-01-16 NOTE — Progress Notes (Signed)
After obtaining consent, and per orders of Dr. Sharlet Salina, injection of Flu shot given deltoid  by Marrian Salvage. Patient instructed to report any adverse reaction to me immediately.

## 2022-01-28 ENCOUNTER — Encounter: Payer: Self-pay | Admitting: Internal Medicine

## 2022-02-21 ENCOUNTER — Ambulatory Visit (INDEPENDENT_AMBULATORY_CARE_PROVIDER_SITE_OTHER)
Admission: RE | Admit: 2022-02-21 | Discharge: 2022-02-21 | Disposition: A | Discharge status: Home / Self Care | Payer: BC Managed Care – PPO | Source: Ambulatory Visit | Attending: Internal Medicine | Admitting: Internal Medicine

## 2022-02-21 DIAGNOSIS — C50912 Malignant neoplasm of unspecified site of left female breast: Secondary | ICD-10-CM

## 2022-02-21 LAB — HM DEXA SCAN

## 2022-02-28 ENCOUNTER — Encounter: Payer: BC Managed Care – PPO | Admitting: Adult Health

## 2022-03-07 ENCOUNTER — Inpatient Hospital Stay: Payer: BC Managed Care – PPO | Attending: Adult Health | Admitting: Adult Health

## 2022-03-07 ENCOUNTER — Encounter: Payer: Self-pay | Admitting: Adult Health

## 2022-03-07 ENCOUNTER — Other Ambulatory Visit: Payer: Self-pay

## 2022-03-07 VITALS — BP 140/76 | HR 73 | Temp 97.7°F | Resp 18 | Ht 64.75 in | Wt 151.5 lb

## 2022-03-07 DIAGNOSIS — C50912 Malignant neoplasm of unspecified site of left female breast: Secondary | ICD-10-CM

## 2022-03-07 DIAGNOSIS — C50412 Malignant neoplasm of upper-outer quadrant of left female breast: Secondary | ICD-10-CM

## 2022-03-07 DIAGNOSIS — Z170 Estrogen receptor positive status [ER+]: Secondary | ICD-10-CM

## 2022-03-07 DIAGNOSIS — Z923 Personal history of irradiation: Secondary | ICD-10-CM | POA: Insufficient documentation

## 2022-03-07 DIAGNOSIS — Z853 Personal history of malignant neoplasm of breast: Secondary | ICD-10-CM | POA: Insufficient documentation

## 2022-03-07 DIAGNOSIS — Z9013 Acquired absence of bilateral breasts and nipples: Secondary | ICD-10-CM | POA: Insufficient documentation

## 2022-03-07 NOTE — Progress Notes (Signed)
[facility] Follow up:    Erin Koch, MD [place] [address]   DIAGNOSIS:  Cancer Staging  Malignant neoplasm of upper-outer quadrant of left breast in female, estrogen receptor positive ([place]) Staging form: Breast, AJCC 7th Edition - Clinical: Stage IV ([place], [place], M1) - Signed by Chauncey Cruel, MD on 07/04/2014  Recurrent cancer of left breast [facility]) Staging form: Breast, AJCC 7th Edition - Clinical: Stage IV ([place], [place], M1) - Signed by Chauncey Cruel, MD on 07/04/2014   SUMMARY OF ONCOLOGIC HISTORY: 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 Oncotype DX: 17, 10 to 12% distant recurrence risk over 10 years Post mastectomy radiation February 2010-May 2015: Tamoxifen stopped because of endometrial hyperplasia 04/21/2014: Recurrence: Skin biopsy adenocarcinoma ER/PR positive 06/01/2014: Wide excision with negative margins 07/12/2014-08/16/2014: Chest wall radiation 05/06/2014: PET/CT: Activity in the right ischial tuberosity: Biopsy negative Repeat biopsy: Negative 05/09/2014-02/20/2015: Goserelin 03/13/2015: BSO: Benign 04/26/2019-05/05/2019: SBRT to right ischial tuberosity lesion 08/29/2014-08/28/2021: Anastrozole stopped after 7 years  CURRENT THERAPY: observation  INTERVAL HISTORY: Erin [name] 61 y.o. female returns for follow-up of her history of recurrent breast cancer.  She is now on observation alone.  She has no current issues.  Her most recent Signatera testing was completed on October 11, 2021 and demonstrated no detection of circulating tumor DNA in her blood.  She wants to know her plan moving forward around observation, imaging, and lab testing.     Patient Active Problem List   Diagnosis Date Noted   Seborrheic dermatitis 06/22/2021   Adverse reaction to food 01/26/2021   Allergic rhinitis due to animal (cat) (dog) hair and dander 01/26/2021   Chronic allergic  conjunctivitis 01/26/2021   Fibroids 02/16/2020   Diarrhea 10/14/2019   Routine general medical examination at a health care facility 05/18/2019   Bone metastases 03/26/2019   Pes cavus 05/12/2017   Recurrent cancer of left breast ([place]) 05/06/2014   Endometrial hyperplasia 10/02/2013   Malignant neoplasm of upper-outer quadrant of left breast in female, estrogen receptor positive ([place]) 02/23/2013   Hypothyroidism 01/15/2013   Osteopenia 09/04/2011   Hypertension 03/08/2011   Allergic rhinitis 03/08/2011    is allergic to pollen extract and adhesive [tape].  MEDICAL HISTORY: Past Medical History:  Diagnosis Date   Allergic rhinitis due to pollen    Breast cancer ([place]) 2010   Breast cancer ([place]) 2016   reoccurrence in scar   History of radiation therapy 04/26/19- 05/05/19   Right ischium 5 fractions of 10 Gy each to total 50 Gy.    Hypertension    Hypothyroidism    Osteopenia    S/P radiation therapy 07/12/2014 through 08/16/2014                                                        Left chest wall 4000 cGy in 20 sessions, left chest wall boost 1000 cGy in 5 sessions     Wears glasses     SURGICAL HISTORY: Past Surgical History:  Procedure Laterality Date   BREAST LUMPECTOMY Left 06/01/2014   Procedure: WIDE LOCAL EXCISION OF RECURRENT LEFT BREAST CANCER;  Surgeon: Rolm Bookbinder, MD;  Location: [facility];  Service: General;  Laterality: Left;   COLONOSCOPY  cyst removed     left lower jaw - at age 68 yr   DILATATION & CURRETTAGE/HYSTEROSCOPY WITH RESECTOCOPE N/A 09/16/2013   Procedure: DILATATION & CURETTAGE/HYSTEROSCOPY WITH RESECTOCOPE;  Surgeon: Princess Bruins, MD;  Location: [facility];  Service: Gynecology;  Laterality: N/A;  1 hr.   INGUINAL HERNIA REPAIR     right side as infant   LAPAROSCOPIC PELVIC LYMPH NODE BIOPSY     MASTECTOMY  2010   bilateral mastectomies-lt snbx-radiation post   MASTECTOMY Bilateral    OOPHORECTOMY  2016   patient  denies   Right knee arthroscopy  2008   ROBOTIC ASSISTED BILATERAL SALPINGO OOPHERECTOMY Bilateral 03/13/2015   Procedure: ROBOTIC ASSISTED BILATERAL SALPINGO OOPHORECTOMY With [place];  Surgeon: Princess Bruins, MD;  Location: [facility];  Service: Gynecology;  Laterality: Bilateral;   TONSILLECTOMY     WISDOM TOOTH EXTRACTION      SOCIAL HISTORY: Social History   Socioeconomic History   Marital status: Single    Spouse name: Not on file   Number of children: 0   Years of education: PhD   Highest education level: Not on file  Occupational History   Not on file  Tobacco Use   Smoking status: Never   Smokeless tobacco: Never  Vaping Use   Vaping Use: Never used  Substance and Sexual Activity   Alcohol use: No    Alcohol/week: 0.0 standard drinks of alcohol    Comment: rare   Drug use: No   Sexual activity: Not Currently    Birth control/protection: None    Comment: did not answer high risk questions  Other Topics Concern   Not on file  Social History Narrative   She grew up in [place] last 16 yrs in [place]   Rare caffeine use    Social Determinants of Health   Financial Resource Strain: Not on file  Food Insecurity: Not on file  Transportation Needs: No Transportation Needs (03/31/2019)   PRAPARE - Hydrologist (Medical): No    Lack of Transportation (Non-Medical): No  Physical Activity: Not on file  Stress: Not on file  Social Connections: Not on file  Intimate Partner Violence: Not At Risk (03/31/2019)   Humiliation, Afraid, Rape, and Kick questionnaire    Fear of Current or Ex-Partner: No    Emotionally Abused: No    Physically Abused: No    Sexually Abused: No    FAMILY HISTORY: Family History  Problem Relation Age of Onset   Hypertension Mother    Cancer Mother        Lung Cancer - long history of tobacco use   Hypertension Father    Stroke Maternal Grandfather    Stroke Paternal Grandfather    Hypertension  Brother    Colon cancer Neg Hx    Stomach cancer Neg Hx     Review of Systems  Constitutional:  Negative for appetite change, chills, fatigue, fever and unexpected weight change.  HENT:   Negative for hearing loss, lump/mass and trouble swallowing.   Eyes:  Negative for eye problems and icterus.  Respiratory:  Negative for chest tightness, cough and shortness of breath.   Cardiovascular:  Negative for chest pain, leg swelling and palpitations.  Gastrointestinal:  Negative for abdominal distention, abdominal pain, constipation, diarrhea, nausea and vomiting.  Endocrine: Negative for hot flashes.  Genitourinary:  Negative for difficulty urinating.   Musculoskeletal:  Negative for arthralgias.  Skin:  Negative for itching and rash.  Neurological:  Negative for dizziness, extremity weakness, headaches and numbness.  Hematological:  Negative for adenopathy. Does not bruise/bleed easily.  Psychiatric/Behavioral:  Negative for depression. The patient is not nervous/anxious.       PHYSICAL EXAMINATION  ECOG PERFORMANCE STATUS: 0 - Asymptomatic  Vitals:   03/07/22 1407  BP: (!) 140/76  Pulse: 73  Resp: 18  Temp: 97.7 F (36.5 C)  SpO2: 99%    Physical Exam Constitutional:      General: She is not in acute distress.    Appearance: Normal appearance. She is not toxic-appearing.  HENT:     Head: Normocephalic and atraumatic.  Eyes:     General: No scleral icterus. Cardiovascular:     Rate and Rhythm: Normal rate and regular rhythm.     Pulses: Normal pulses.     Heart sounds: Normal heart sounds.  Pulmonary:     Effort: Pulmonary effort is normal.     Breath sounds: Normal breath sounds.  Chest:     Comments: S/p bilateral mastectomies, no sign of local recurrence, benign exam Abdominal:     General: Abdomen is flat. Bowel sounds are normal. There is no distension.     Palpations: Abdomen is soft.     Tenderness: There is no abdominal tenderness.  Musculoskeletal:         General: No swelling.     Cervical back: Neck supple.  Lymphadenopathy:     Cervical: No cervical adenopathy.  Skin:    General: Skin is warm and dry.     Findings: No rash.  Neurological:     General: No focal deficit present.     Mental Status: She is alert.  Psychiatric:        Mood and Affect: Mood normal.        Behavior: Behavior normal.     LABORATORY DATA: None for this visit  ASSESSMENT and THERAPY PLAN:   Recurrent cancer of left breast ([place]) Erin [name] is a 61 year old with h/o recurrent breast cancer with isolated bone metastases diagnosed initialy with stage IIA ER/PR positive breast cancer in 2010 s/p bilateral mastectomies, adjuvant radiation, and antiestrogen therapy x 5 years, local recurrence in in 2015 s/p excision, radiation, PET CT showing activity in right ischial tuberositys/p goserelin, bilateral BSO, radiation to ischial tuberositiy and 7 years of anastrozole.    Erin [name] has been followed by intermittent imaging which has shown no signs of recurrence.  We discussed future plans of monitoring which includes signatera testing.  I asked Dr. Geralyn Flash nurse Merleen Nicely to reach out to the company to f/u on frequency of testing and f/u with the patient.    We discussed how Dr. Lindi Adie and I will alternate and see Erin [name] every 6 months for surveillance.  She will see Dr. Lindi Adie in 08/2022.     All questions were answered. The patient knows to call the clinic with any problems, questions or concerns. We can certainly see the patient much sooner if necessary.  Total encounter time:30 minutes*in face-to-face visit time, chart review, lab review, care coordination, order entry, and documentation of the encounter time.    Wilber Bihari, NP 03/10/22 4:52 PM Medical Oncology and Hematology [facility] [place], [name] [address] Tel. [phone]    Fax. [phone]  *Total Encounter Time as defined by the Centers for Medicare and Medicaid  Services includes, in addition to the face-to-face time of a patient visit (documented in the note above) non-face-to-face time: obtaining and  reviewing outside history, ordering and reviewing medications, tests or procedures, care coordination (communications with other health care professionals or caregivers) and documentation in the medical record.

## 2022-03-10 ENCOUNTER — Encounter: Payer: Self-pay | Admitting: Oncology

## 2022-03-10 NOTE — Assessment & Plan Note (Signed)
Erin [name] is a 61 year old with h/o recurrent breast cancer with isolated bone metastases diagnosed initialy with stage IIA ER/PR positive breast cancer in 2010 s/p bilateral mastectomies, adjuvant radiation, and antiestrogen therapy x 5 years, local recurrence in in 2015 s/p excision, radiation, PET CT showing activity in right ischial tuberositys/p goserelin, bilateral BSO, radiation to ischial tuberositiy and 7 years of anastrozole.    Erin [name] has been followed by intermittent imaging which has shown no signs of recurrence.  We discussed future plans of monitoring which includes signatera testing.  I asked Dr. Geralyn Flash nurse Merleen Nicely to reach out to the company to f/u on frequency of testing and f/u with the patient.    We discussed how Dr. Lindi Adie and I will alternate and see Erin [name] every 6 months for surveillance.  She will see Dr. Lindi Adie in 08/2022.

## 2022-04-17 ENCOUNTER — Encounter: Payer: Self-pay | Admitting: Obstetrics & Gynecology

## 2022-04-17 ENCOUNTER — Ambulatory Visit (INDEPENDENT_AMBULATORY_CARE_PROVIDER_SITE_OTHER): Payer: BC Managed Care – PPO | Admitting: Obstetrics & Gynecology

## 2022-04-17 VITALS — BP 126/89 | HR 78 | Ht 64.25 in | Wt 156.0 lb

## 2022-04-17 DIAGNOSIS — C50412 Malignant neoplasm of upper-outer quadrant of left female breast: Secondary | ICD-10-CM

## 2022-04-17 DIAGNOSIS — Z01419 Encounter for gynecological examination (general) (routine) without abnormal findings: Secondary | ICD-10-CM

## 2022-04-17 DIAGNOSIS — Z780 Asymptomatic menopausal state: Secondary | ICD-10-CM | POA: Diagnosis not present

## 2022-04-17 DIAGNOSIS — M85851 Other specified disorders of bone density and structure, right thigh: Secondary | ICD-10-CM

## 2022-04-17 DIAGNOSIS — Z170 Estrogen receptor positive status [ER+]: Secondary | ICD-10-CM

## 2022-04-17 NOTE — Progress Notes (Signed)
Erin [name] 1960-07-10 [id number]   History:    61 y.o. G0 Single   RP:  Established patient presenting for annual gyn exam    HPI: Surgical menopause status post laparoscopic bilateral salpingo-oophorectomy November 2016.  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  Status post bilateral mastectomy. A PET scan in May 06, 2014 showed a 1.9 cm sclerotic lesion in the right ischial tuberosity.  Followed by MRIs of the pelvis. MRI 01/2021 Increase in size of the ischial tuberosity lesion, probably post Radiotherapy changes.  PET scan Negative. Stopped Arimidex.  Stopped Prolia.  BD 02/2022: Osteopenia at the Rt Femoral Neck with T-Score -1.9.    No current pelvic pain.  No postmenopausal bleeding.  Abstinent currently. Pap Neg 03/2020.  HPV HR Neg in 2019. No indication for a Pap this year. Very mild menopausal symptoms.  Urine and bowel movements normal.  Colono 03/2012, repeat Colono.  BMI 26.57. Physically active.   Past medical history,surgical history, family history and social history were all reviewed and documented in the EPIC chart.  Gynecologic History No LMP recorded (lmp unknown). Patient is postmenopausal.  Obstetric History OB History  Gravida Para Term Preterm AB Living  0 0 0 0 0 0  SAB IAB Ectopic Multiple Live Births  0 0 0 0 0     ROS: A ROS was performed and pertinent positives and negatives are included in the history. GENERAL: No fevers or chills. HEENT: No change in vision, no earache, sore throat or sinus congestion. NECK: No pain or stiffness. CARDIOVASCULAR: No chest pain or pressure. No palpitations. PULMONARY: No shortness of breath, cough or wheeze. GASTROINTESTINAL: No abdominal pain, nausea, vomiting or diarrhea, melena or bright red blood per rectum. GENITOURINARY: No urinary frequency, urgency,  hesitancy or dysuria. MUSCULOSKELETAL: No joint or muscle pain, no back pain, no recent trauma. DERMATOLOGIC: No rash, no itching, no lesions. ENDOCRINE: No polyuria, polydipsia, no heat or cold intolerance. No recent change in weight. HEMATOLOGICAL: No anemia or easy bruising or bleeding. NEUROLOGIC: No headache, seizures, numbness, tingling or weakness. PSYCHIATRIC: No depression, no loss of interest in normal activity or change in sleep pattern.     Exam:   BP 126/89   Pulse 78   Ht 5' 4.25" (1.632 m)   Wt 156 lb (70.8 kg)   LMP  (LMP Unknown)   SpO2 91%   BMI 26.57 kg/m   Body mass index is 26.57 kg/m.  General appearance : Well developed well nourished female. No acute distress HEENT: Eyes: no retinal hemorrhage or exudates,  Neck supple, trachea midline, no carotid bruits, no thyroidmegaly Lungs: Clear to auscultation, no rhonchi or wheezes, or rib retractions  Heart: Regular rate and rhythm, no murmurs or gallops Breast:Examined in sitting and supine position S/P Bilateral Mastectomy.  No palpable masses or tenderness,  no skin retraction, no nipple inversion, no nipple discharge, no skin discoloration, no axillary or supraclavicular lymphadenopathy Abdomen: no palpable masses or tenderness, no rebound or guarding Extremities: no edema or skin discoloration or tenderness  Pelvic: Vulva: Normal             Vagina: No gross lesions or discharge  Cervix: No gross lesions or discharge  Uterus  AV, normal size, shape and consistency, non-tender and mobile  Adnexa  Without masses or tenderness  Anus: Normal   Assessment/Plan:  61 y.o.  female for annual exam   1. Well female exam with routine gynecological exam Surgical menopause status post laparoscopic bilateral salpingo-oophorectomy November 2016.  History of left breast cancer in 2010 on tamoxifen until 2015 and then recurrent left breast ductal adenocarcinoma positive estrogen and progesterone receptor diagnosed by biopsy  April 21, 2014.  Status post bilateral mastectomy. A PET scan in May 06, 2014 showed a 1.9 cm sclerotic lesion in the right ischial tuberosity.  Followed by MRIs of the pelvis. MRI 01/2021 Increase in size of the ischial tuberosity lesion, probably post Radiotherapy changes.  PET scan Negative. Stopped Arimidex.  Stopped Prolia.  BD 02/2022: Osteopenia at the Rt Femoral Neck with T-Score -1.9.    No current pelvic pain.  No postmenopausal bleeding.  Abstinent currently. Pap Neg 03/2020.  HPV HR Neg in 2019. No indication for a Pap this year. Very mild menopausal symptoms.  Urine and bowel movements normal.  Colono 03/2012, repeat Colono.  BMI 26.57. Physically active.  2. Postmenopausal Surgical menopause status post laparoscopic bilateral salpingo-oophorectomy November 2016.   3. Osteopenia of neck of right femur Stopped Prolia.  BD 02/2022: Osteopenia at the Rt Femoral Neck with T-Score -1.9.   4. Malignant neoplasm of upper-outer quadrant of left breast in female, estrogen receptor positive ([place])  Bilateral Mastectomy.  Princess Bruins MD, 2:46 PM

## 2022-05-31 ENCOUNTER — Telehealth: Payer: Self-pay

## 2022-05-31 NOTE — Telephone Encounter (Signed)
Called pt per MD to advise Signatera testing was negative/not detected. Pt verbalized understanding of results and knows Daleen Snook will be in touch to schedule 3 mo repeat lab but pt states that she was informed it was 6 months.

## 2022-06-03 ENCOUNTER — Other Ambulatory Visit: Payer: Self-pay

## 2022-06-03 ENCOUNTER — Encounter: Payer: Self-pay | Admitting: Hematology and Oncology

## 2022-06-28 ENCOUNTER — Encounter: Payer: Self-pay | Admitting: Internal Medicine

## 2022-06-28 ENCOUNTER — Ambulatory Visit (INDEPENDENT_AMBULATORY_CARE_PROVIDER_SITE_OTHER): Payer: BC Managed Care – PPO | Admitting: Internal Medicine

## 2022-06-28 VITALS — BP 120/84 | HR 82 | Temp 98.3°F | Ht 64.0 in | Wt 150.0 lb

## 2022-06-28 DIAGNOSIS — M8580 Other specified disorders of bone density and structure, unspecified site: Secondary | ICD-10-CM | POA: Diagnosis not present

## 2022-06-28 DIAGNOSIS — E038 Other specified hypothyroidism: Secondary | ICD-10-CM

## 2022-06-28 DIAGNOSIS — Z0000 Encounter for general adult medical examination without abnormal findings: Secondary | ICD-10-CM

## 2022-06-28 DIAGNOSIS — I10 Essential (primary) hypertension: Secondary | ICD-10-CM | POA: Diagnosis not present

## 2022-06-28 MED ORDER — LEVOTHYROXINE SODIUM 75 MCG PO TABS: ORAL_TABLET | ORAL | 3 refills | Status: DC

## 2022-06-28 MED ORDER — LOSARTAN POTASSIUM 100 MG PO TABS: ORAL_TABLET | ORAL | 3 refills | Status: DC

## 2022-06-28 MED ORDER — ACYCLOVIR 5 % EX OINT: 1.0000 | TOPICAL_OINTMENT | CUTANEOUS | 5 refills | Status: DC

## 2022-06-28 NOTE — Assessment & Plan Note (Signed)
Checking CBC, CMP, lipid panel and adjust as needed losartan 100 mg daily.

## 2022-06-28 NOTE — Assessment & Plan Note (Signed)
Follow up DEXA due 2025

## 2022-06-28 NOTE — Progress Notes (Signed)
   Subjective:   Patient ID: Erin [name], female    DOB: 10/01/1960, 62 y.o.   MRN: [id number]  HPI The patient is here for physical.  PMH, [facility], social history reviewed and updated  Review of Systems  Constitutional: Negative.   HENT: Negative.    Eyes: Negative.   Respiratory:  Negative for cough, chest tightness and shortness of breath.   Cardiovascular:  Negative for chest pain, palpitations and leg swelling.  Gastrointestinal:  Negative for abdominal distention, abdominal pain, constipation, diarrhea, nausea and vomiting.  Musculoskeletal: Negative.   Skin: Negative.   Neurological: Negative.   Psychiatric/Behavioral: Negative.      Objective:  Physical Exam Constitutional:      Appearance: She is well-developed.  HENT:     Head: Normocephalic and atraumatic.  Cardiovascular:     Rate and Rhythm: Normal rate and regular rhythm.  Pulmonary:     Effort: Pulmonary effort is normal. No respiratory distress.     Breath sounds: Normal breath sounds. No wheezing or rales.  Abdominal:     General: Bowel sounds are normal. There is no distension.     Palpations: Abdomen is soft.     Tenderness: There is no abdominal tenderness. There is no rebound.  Musculoskeletal:     Cervical back: Normal range of motion.  Skin:    General: Skin is warm and dry.  Neurological:     Mental Status: She is alert and oriented to person, place, and time.     Coordination: Coordination normal.     Vitals:   06/28/22 1505  BP: 120/84  Pulse: 82  Temp: 98.3 F (36.8 C)  TempSrc: Oral  SpO2: 97%  Weight: 150 lb (68 kg)  Height: 5\' 4"  (1.626 m)    Assessment & Plan:

## 2022-06-28 NOTE — Assessment & Plan Note (Signed)
Flu shot up to date. Covid-19 up to date. Shingrix complete. Tetanus complete. Colonoscopy scheduled later this year. Mammogram up to date, pap smear up to date and dexa due 2025. Counseled about sun safety and mole surveillance. Counseled about the dangers of distracted driving. Given 10 year screening recommendations.

## 2022-06-28 NOTE — Assessment & Plan Note (Signed)
Checking TSH and free T4 and adjust synthroid 75 mcg daily as needed.  

## 2022-07-26 ENCOUNTER — Encounter: Payer: Self-pay | Admitting: Hematology and Oncology

## 2022-07-31 ENCOUNTER — Other Ambulatory Visit (INDEPENDENT_AMBULATORY_CARE_PROVIDER_SITE_OTHER): Payer: BC Managed Care – PPO

## 2022-07-31 DIAGNOSIS — I10 Essential (primary) hypertension: Secondary | ICD-10-CM | POA: Diagnosis not present

## 2022-07-31 DIAGNOSIS — E038 Other specified hypothyroidism: Secondary | ICD-10-CM | POA: Diagnosis not present

## 2022-07-31 LAB — LIPID PANEL
Cholesterol: 144 mg/dL (ref 0–200)
HDL: 59.3 mg/dL (ref >39.00)
LDL Cholesterol: 75 mg/dL (ref 0–99)
NonHDL: 84.73
Total CHOL/HDL Ratio: 2
Triglycerides: 48 mg/dL (ref 0.0–149.0)
VLDL: 9.6 mg/dL (ref 0.0–40.0)

## 2022-07-31 LAB — CBC
HCT: 44.8 % (ref 36.0–46.0)
Hemoglobin: 15.3 g/dL — ABNORMAL HIGH (ref 12.0–15.0)
MCHC: 34.3 g/dL (ref 30.0–36.0)
MCV: 89.3 fl (ref 78.0–100.0)
Platelets: 224 10*3/uL (ref 150.0–400.0)
RBC: 5.01 Mil/uL (ref 3.87–5.11)
RDW: 13.1 % (ref 11.5–15.5)
WBC: 4.1 10*3/uL (ref 4.0–10.5)

## 2022-07-31 LAB — COMPREHENSIVE METABOLIC PANEL
ALT: 13 U/L (ref 0–35)
AST: 20 U/L (ref 0–37)
Albumin: 4.2 g/dL (ref 3.5–5.2)
Alkaline Phosphatase: 91 U/L (ref 39–117)
BUN: 20 mg/dL (ref 6–23)
CO2: 30 mEq/L (ref 19–32)
Calcium: 9.7 mg/dL (ref 8.4–10.5)
Chloride: 99 mEq/L (ref 96–112)
Creatinine, Ser: 0.82 mg/dL (ref 0.40–1.20)
GFR: 77.6 mL/min (ref >60.00)
Glucose, Bld: 77 mg/dL (ref 70–99)
Potassium: 4.3 mEq/L (ref 3.5–5.1)
Sodium: 135 mEq/L (ref 135–145)
Total Bilirubin: 1.4 mg/dL — ABNORMAL HIGH (ref 0.2–1.2)
Total Protein: 6.8 g/dL (ref 6.0–8.3)

## 2022-07-31 LAB — VITAMIN D 25 HYDROXY (VIT D DEFICIENCY, FRACTURES): VITD: 43.18 ng/mL (ref 30.00–100.00)

## 2022-07-31 LAB — TSH: TSH: 1.37 u[IU]/mL (ref 0.35–5.50)

## 2022-07-31 LAB — T4, FREE: Free T4: 1.28 ng/dL (ref 0.60–1.60)

## 2022-08-16 ENCOUNTER — Encounter: Payer: Self-pay | Admitting: *Deleted

## 2022-09-04 NOTE — Progress Notes (Signed)
Patient Care Team: Myrlene Broker, MD as PCP - General (Internal Medicine) Emelia Loron, MD as Consulting Physician (General Surgery) Genia Del, MD as Consulting Physician (Obstetrics and Gynecology) Rhea Belton, Carie Caddy, MD as Consulting Physician (Gastroenterology) Huston Foley, MD as Attending Physician (Neurology) Irene Limbo, DDS as Referring Physician (Dentistry) Judi Saa, DO as Consulting Physician (Family Medicine) Elmon Else, MD as Consulting Physician (Dermatology) Lonie Peak, MD as Attending Physician (Radiation Oncology) Irene Limbo, DDS as Referring Physician (Dentistry)  DIAGNOSIS: No diagnosis found.  SUMMARY OF ONCOLOGIC HISTORY: Oncology History   No history exists.    CHIEF COMPLIANT: Surveillance  INTERVAL HISTORY: Erin [name] is a 62 y.o. with the above mention Recurrent breast cancer (s/p bilateral mastectomies)  She presents to the clinic today for a follow-up.    ALLERGIES:  is allergic to pollen extract and adhesive [tape].  MEDICATIONS:  Current Outpatient Medications  Medication Sig Dispense Refill   acyclovir ointment (ZOVIRAX) 5 % Apply 1 Application topically every 3 (three) hours. As needed 5 g 5   Ascorbic Acid (VITAMIN C PO) Take by mouth.     Cholecalciferol (VITAMIN D3) 2000 UNITS capsule Take 2,000 Units by mouth daily.     Ciclopirox 1 % shampoo Use topically on scalp 120 mL 0   fexofenadine (ALLEGRA) 180 MG tablet Take 180 mg by mouth daily as needed for allergies or rhinitis.     ibuprofen (ADVIL,MOTRIN) 200 MG tablet Take 200 mg by mouth every 6 (six) hours as needed for moderate pain.     levothyroxine (SYNTHROID) 75 MCG tablet TAKE 1 TABLET(75 MCG) BY MOUTH DAILY BEFORE BREAKFAST 90 tablet 3   losartan (COZAAR) 100 MG tablet TAKE 1 TABLET(100 MG) BY MOUTH DAILY 90 tablet 3   Mometasone Furoate 370 MCG IMPL Apply 1 Application topically as directed. Five nights a week--0.1% topical solution for  scalp     Olopatadine HCl (PATADAY) 0.7 % SOLN Apply to eye.     Olopatadine HCl 0.6 % SOLN Place 2 sprays into both nostrils 2 (two) times daily as needed (allergies).      Omega-3 Fatty Acids (FISH OIL PO) Take by mouth.     Polyethyl Glycol-Propyl Glycol (SYSTANE OP) Place 1 drop into both eyes daily as needed (for allergies).      pyrithione zinc (SELSUN BLUE DRY SCALP) 1 % shampoo 1 application in place of regular shampoo Externally Two times a Week     selenium sulfide (SELSUN) 1 % LOTN Apply 1 application topically daily. 207 mL 11   Turmeric 500 MG CAPS Take 500 mg by mouth daily.      No current facility-administered medications for this visit.    PHYSICAL EXAMINATION: ECOG PERFORMANCE STATUS: {CHL ONC ECOG PS:[id number]}  There were no vitals filed for this visit. There were no vitals filed for this visit.  BREAST:*** No palpable masses or nodules in either right or left breasts. No palpable axillary supraclavicular or infraclavicular adenopathy no breast tenderness or nipple discharge. (exam performed in the presence of a chaperone)  LABORATORY DATA:  I have reviewed the data as listed    Latest Ref Rng & Units 07/31/2022    9:36 AM 08/28/2021    2:26 PM 02/15/2021    2:22 PM  CMP  Glucose 70 - 99 mg/dL 77  295  621   BUN 6 - 23 mg/dL 20  22  22    Creatinine 0.40 - 1.20 mg/dL 3.08  6.57  8.46   Sodium  135 - 145 mEq/L 135  135  137   Potassium 3.5 - 5.1 mEq/L 4.3  4.3  4.6   Chloride 96 - 112 mEq/L 99  101  102   CO2 19 - 32 mEq/L 30  30  23    Calcium 8.4 - 10.5 mg/dL 9.7  9.7  9.9   Total Protein 6.0 - 8.3 g/dL 6.8  7.0  7.2   Total Bilirubin 0.2 - 1.2 mg/dL 1.4  1.1  1.4   Alkaline Phos 39 - 117 U/L 91  61  88   AST 0 - 37 U/L 20  17  19    ALT 0 - 35 U/L 13  10  13      Lab Results  Component Value Date   WBC 4.1 07/31/2022   HGB 15.3 (H) 07/31/2022   HCT 44.8 07/31/2022   MCV 89.3 07/31/2022   PLT 224.0 07/31/2022   NEUTROABS 4.2 08/28/2021    ASSESSMENT  & PLAN:  No problem-specific Assessment & Plan notes found for this encounter.    No orders of the defined types were placed in this encounter.  The patient has a good understanding of the overall plan. she agrees with it. she will call with any problems that may develop before the next visit here. Total time spent: 30 mins including face to face time and time spent for planning, charting and co-ordination of care   Sherlyn Lick, CMA 09/04/22    I Erin [name] am acting as a Neurosurgeon for [employer]  ***

## 2022-09-05 ENCOUNTER — Inpatient Hospital Stay: Payer: BC Managed Care – PPO | Attending: Hematology and Oncology | Admitting: Hematology and Oncology

## 2022-09-05 ENCOUNTER — Other Ambulatory Visit: Payer: Self-pay

## 2022-09-05 VITALS — BP 128/74 | HR 79 | Temp 98.9°F | Wt 149.9 lb

## 2022-09-05 DIAGNOSIS — Z08 Encounter for follow-up examination after completed treatment for malignant neoplasm: Secondary | ICD-10-CM | POA: Diagnosis not present

## 2022-09-05 DIAGNOSIS — C50412 Malignant neoplasm of upper-outer quadrant of left female breast: Secondary | ICD-10-CM | POA: Diagnosis not present

## 2022-09-05 DIAGNOSIS — Z170 Estrogen receptor positive status [ER+]: Secondary | ICD-10-CM | POA: Diagnosis not present

## 2022-09-05 DIAGNOSIS — Z853 Personal history of malignant neoplasm of breast: Secondary | ICD-10-CM | POA: Insufficient documentation

## 2022-09-05 NOTE — Assessment & Plan Note (Addendum)
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 Oncotype DX: 17, 10 to 12% distant recurrence risk over 10 years Post mastectomy radiation February 2010-May 2015: Tamoxifen stopped because of endometrial hyperplasia 04/21/2014: Recurrence: Skin biopsy adenocarcinoma ER/PR positive 06/01/2014: Wide excision with negative margins 07/12/2014-08/16/2014: Chest wall radiation 05/06/2014: PET/CT: Activity in the right ischial tuberosity: Biopsy negative Repeat biopsy: Negative 05/09/2014-02/20/2015: Goserelin 03/13/2015: BSO: Benign 04/26/2019-05/05/2019: SBRT to right ischial tuberosity lesion 08/29/2014-08/28/2021: Anastrozole stopped after 7 years   Prior treatment: Anastrozole started 08/29/2014 -08/28/2021 along with Prolia for osteoporosis (both have been discontinued)    Surveillance: Signatera blood testing for circulating cell free DNA: Negative   bone density November 2023: T-score -1.2   Follow-up in 1 year for follow-up

## 2022-09-12 ENCOUNTER — Encounter: Payer: Self-pay | Admitting: Internal Medicine

## 2022-09-12 ENCOUNTER — Ambulatory Visit: Payer: BC Managed Care – PPO | Admitting: Internal Medicine

## 2022-09-12 VITALS — BP 122/78 | HR 80 | Ht 65.0 in | Wt 148.2 lb

## 2022-09-12 DIAGNOSIS — Z8619 Personal history of other infectious and parasitic diseases: Secondary | ICD-10-CM

## 2022-09-12 DIAGNOSIS — Z1211 Encounter for screening for malignant neoplasm of colon: Secondary | ICD-10-CM | POA: Diagnosis not present

## 2022-09-12 MED ORDER — PLENVU 140 G PO SOLR: 1.0000 | ORAL | 0 refills | Status: DC

## 2022-09-12 NOTE — Progress Notes (Signed)
   Subjective:    Patient ID: Erin [name], female    DOB: 1961-04-05, 62 y.o.   MRN: [id number]  HPI Erin [name] is a 62 year old female with a history of prior C. difficile in 2021, history of breast cancer, hypothyroidism and hypertension, uterine fibroids who is here for follow-up to discuss colonoscopy.  She is here alone today.  She was last seen in October 2021 by Willette Cluster, NP.  She reports that she has been thinking about and weighing the options as to whether or not to repeat Cologuard versus pursue colonoscopy.  No specific GI or hepatobiliary complaint.  Bowel movements have been regular.  No bleeding. She has tried to avoid dairy. No recurrent C. difficile.  She has been very leery of antibiotics.  Colonoscopy from 04/06/2012 reviewed.  Prep was fair.  Cologuard was done in the interim which was -3 years ago.  Review of Systems As per HPI, otherwise negative  Current Medications, Allergies, Past Medical History, Past Surgical History, Family History and Social History were reviewed in [employer] record.    Objective:   Physical Exam BP 122/78   Pulse 80   Ht 5\' 5"  (1.651 m)   Wt 148 lb 3.2 oz (67.2 kg)   LMP  (LMP Unknown)   SpO2 99%   BMI 24.66 kg/m  Gen: awake, alert, NAD HEENT: anicteric  Neuro: nonfocal      Assessment & Plan:  62 year old female with a history of prior C. difficile in 2021, history of breast cancer, hypothyroidism and hypertension, uterine fibroids who is here for follow-up to discuss colonoscopy.    Colon cancer screening --we reviewed the risks and benefits extensively of colonoscopy but also Cologuard today.  She prefers after our discussion to proceed with colonoscopy in the [facility].  2-day prep is recommended with MiraLAX and Plenvu given fair prep just over 10 years ago -- Colonoscopy in the [facility] with 2-day prep MiraLAX plus Plenvu  2.  History of C. Difficile --we reviewed that hopefully C. difficile will  never occur for her again in the future.  As with any antibiotic she should talk to any prescribing providers to be sure the antibiotic is as narrow spectrum as possible and is definitely indicated for her at that time.  I do recommend Florastor 250 mg twice daily while she is taking antibiotics and for about a month thereafter  30 minutes total spent today including patient facing time, coordination of care, reviewing medical history/procedures/pertinent radiology studies, and documentation of the encounter.

## 2022-09-12 NOTE — Patient Instructions (Signed)
You have been scheduled for a colonoscopy. Please follow written instructions given to you at your visit today.  Please pick up your prep supplies at the pharmacy within the next 1-3 days. If you use inhalers (even only as needed), please bring them with you on the day of your procedure.  _______________________________________________________  If your blood pressure at your visit was 140/90 or greater, please contact your primary care physician to follow up on this.  _______________________________________________________  If you are age 77 or older, your body mass index should be between 23-30. Your Body mass index is 24.66 kg/m. If this is out of the aforementioned range listed, please consider follow up with your Primary Care Provider.  If you are age 67 or younger, your body mass index should be between 19-25. Your Body mass index is 24.66 kg/m. If this is out of the aformentioned range listed, please consider follow up with your Primary Care Provider.   ________________________________________________________  The [facility] providers would like to encourage you to use [facility] to communicate with providers for non-urgent requests or questions.  Due to long hold times on the telephone, sending your provider a message by [facility] may be a faster and more efficient way to get a response.  Please allow 48 business hours for a response.  Please remember that this is for non-urgent requests.  _______________________________________________________  Due to recent changes in healthcare laws, you may see the results of your imaging and laboratory studies on MyChart before your provider has had a chance to review them.  We understand that in some cases there may be results that are confusing or concerning to you. Not all laboratory results come back in the same time frame and the provider may be waiting for multiple results in order to interpret others.  Please give [place] 48 hours in order for your  provider to thoroughly review all the results before contacting the office for clarification of your results.

## 2022-09-27 ENCOUNTER — Telehealth: Payer: Self-pay | Admitting: Internal Medicine

## 2022-09-27 NOTE — Telephone Encounter (Signed)
Spoke with pt and let her know she can just get the store brand and there should be some available that do not say night time. She also wanted to know about the 32oz of gatorade, she can only fine a 28oz bottle. Let her know she can just add enough water to equal 32 oz. Suggested she ask a pharmacist to help with her dulcolax questions. Pt verbalized understanding.

## 2022-09-27 NOTE — Telephone Encounter (Signed)
PT has a colonoscopy approaching and her prep instructions call for her to take Dulcolax 5mg  and she can only find overnight ones. Please advise.

## 2022-10-29 ENCOUNTER — Telehealth: Payer: Self-pay

## 2022-10-29 ENCOUNTER — Other Ambulatory Visit: Payer: Self-pay

## 2022-10-29 DIAGNOSIS — C50412 Malignant neoplasm of upper-outer quadrant of left female breast: Secondary | ICD-10-CM

## 2022-10-29 NOTE — Telephone Encounter (Signed)
Called pt to make her aware of positive 0.2 MTM/mL signatera results. Advised pt per MD we would order CT CAP and NM bone scan.  Pt states she is not confident in the blood draw she received from [facility], as the phlebotomist who came out was not successful getting appropriate amount of blood needed; therefore, per the pt, the phlebotomist opened a tube of blood and poured blood from one tube to a 3rd tube to make enough. Pt feels this contraindicates her results. She would like to be retested before proceeding with scans, and would like to come to [facility] for blood draw.  S/w Kristie Cowman at [facility] who verbalized apology for this happening and states she will make sure pt is able to be redrawn and that if her test is negative, [employer] will cover the cost of her facility charge from [facility], but that if she receives positive results again, signatera will not cover the facility charge. Pt is in agreement with this plan and is scheduled to come to [facility] for labs 10/30/22.

## 2022-10-30 ENCOUNTER — Other Ambulatory Visit: Payer: Self-pay

## 2022-10-30 ENCOUNTER — Inpatient Hospital Stay: Payer: BC Managed Care – PPO | Attending: Hematology and Oncology

## 2022-10-30 ENCOUNTER — Encounter: Payer: Self-pay | Admitting: Internal Medicine

## 2022-10-30 DIAGNOSIS — Z170 Estrogen receptor positive status [ER+]: Secondary | ICD-10-CM

## 2022-10-30 LAB — GENETIC SCREENING ORDER

## 2022-10-31 ENCOUNTER — Encounter: Payer: Self-pay | Admitting: Hematology and Oncology

## 2022-11-11 LAB — SIGNATERA
SIGNATERA MTM READOUT: 0.02 MTM/ml — AB
SIGNATERA TEST RESULT: POSITIVE — AB

## 2022-11-12 ENCOUNTER — Telehealth: Payer: Self-pay

## 2022-11-12 NOTE — Telephone Encounter (Signed)
Called Pt regarding positive signatera results. Per MD, OK to keep monitoring the number but can order scans if Pt desires. Pt states she will continue to monitor but will have clinic lab draw instead of mobile from here on out. Email sent to [facility] with Signatera. Lab appt scheduled. Pt verbalized understanding.

## 2022-11-18 ENCOUNTER — Encounter: Payer: Self-pay | Admitting: Internal Medicine

## 2022-11-18 ENCOUNTER — Ambulatory Visit (AMBULATORY_SURGERY_CENTER): Payer: BC Managed Care – PPO | Admitting: Internal Medicine

## 2022-11-18 VITALS — BP 110/68 | HR 82 | Temp 98.2°F | Resp 18 | Ht 65.0 in | Wt 148.0 lb

## 2022-11-18 DIAGNOSIS — Z1211 Encounter for screening for malignant neoplasm of colon: Secondary | ICD-10-CM | POA: Diagnosis present

## 2022-11-18 MED ORDER — SODIUM CHLORIDE 0.9 % IV SOLN: 500.0000 mL | Freq: Once | INTRAVENOUS | Status: DC

## 2022-11-18 NOTE — Progress Notes (Signed)
Uneventful anesthetic. Report to pacu rn. Vss. Care resumed by rn. 

## 2022-11-18 NOTE — Progress Notes (Signed)
Pt's states no medical or surgical changes since previsit or office visit. 

## 2022-11-18 NOTE — Op Note (Signed)
[facility]oscopy Center Patient Name: Erin [name] Procedure Date: 11/18/2022 1:39 PM MRN: [id number] Endoscopist: Beverley Fiedler , MD, [id number] Age: 62 Referring MD:  Date of Birth: 1960-08-09 Gender: Female Account #: [id number] Procedure:                Colonoscopy Indications:              Screening for colorectal malignant neoplasm, Last                            colonoscopy: December 2013 Medicines:                Monitored Anesthesia Care Procedure:                Pre-Anesthesia Assessment:                           - Prior to the procedure, a History and Physical                            was performed, and patient medications and                            allergies were reviewed. The patient's tolerance of                            previous anesthesia was also reviewed. The risks                            and benefits of the procedure and the sedation                            options and risks were discussed with the patient.                            All questions were answered, and informed consent                            was obtained. Prior Anticoagulants: The patient has                            taken no anticoagulant or antiplatelet agents. ASA                            Grade Assessment: II - A patient with mild systemic                            disease. After reviewing the risks and benefits,                            the patient was deemed in satisfactory condition to                            undergo the procedure.  After obtaining informed consent, the colonoscope                            was passed under direct vision. Throughout the                            procedure, the patient's blood pressure, pulse, and                            oxygen saturations were monitored continuously. The                            Olympus PCF-H190DL (#[id number]) Colonoscope was                            introduced through the anus and  advanced to the                            cecum, identified by appendiceal orifice and                            ileocecal valve. The colonoscopy was performed                            without difficulty. The patient tolerated the                            procedure well. The quality of the bowel                            preparation was good. The ileocecal valve,                            appendiceal orifice, and rectum were photographed. Scope In: 1:50:26 PM Scope Out: 2:07:18 PM Scope Withdrawal Time: 0 hours 9 minutes 56 seconds  Total Procedure Duration: 0 hours 16 minutes 52 seconds  Findings:                 The digital rectal exam was normal.                           Multiple medium-mouthed and small-mouthed                            diverticula were found in the sigmoid colon,                            descending colon and ascending colon.                           The exam was otherwise without abnormality on                            direct and retroflexion views. Complications:            No immediate complications.  Estimated Blood Loss:     Estimated blood loss: none. Impression:               - Moderate diverticulosis in the sigmoid colon, in                            the descending colon and in the ascending colon.                           - The examination was otherwise normal on direct                            and retroflexion views.                           - No specimens collected. Recommendation:           - Patient has a contact number available for                            emergencies. The signs and symptoms of potential                            delayed complications were discussed with the                            patient. Return to normal activities tomorrow.                            Written discharge instructions were provided to the                            patient.                           - Resume previous diet.                            - Continue present medications.                           - Await pathology results.                           - Repeat colonoscopy in 10 years for screening                            purposes. Beverley Fiedler, MD 11/18/2022 2:13:25 PM This report has been signed electronically.

## 2022-11-18 NOTE — Patient Instructions (Addendum)
-   Return to normal activities tomorrow.  - Written discharge instructions were provided to the patient. - Resume previous diet. - Continue present medications. - Await pathology results. - Repeat colonoscopy in 10 years for screening purposes.  YOU HAD AN ENDOSCOPIC PROCEDURE TODAY AT THE [facility]:   Refer to the procedure report that was given to you for any specific questions about what was found during the examination.  If the procedure report does not answer your questions, please call your gastroenterologist to clarify.  If you requested that your care partner not be given the details of your procedure findings, then the procedure report has been included in a sealed envelope for you to review at your convenience later.  YOU SHOULD EXPECT: Some feelings of bloating in the abdomen. Passage of more gas than usual.  Walking can help get rid of the air that was put into your GI tract during the procedure and reduce the bloating. If you had a lower endoscopy (such as a colonoscopy or flexible sigmoidoscopy) you may notice spotting of blood in your stool or on the toilet paper. If you underwent a bowel prep for your procedure, you may not have a normal bowel movement for a few days.  Please Note:  You might notice some irritation and congestion in your nose or some drainage.  This is from the oxygen used during your procedure.  There is no need for concern and it should clear up in a day or so.  SYMPTOMS TO REPORT IMMEDIATELY:  Following lower endoscopy (colonoscopy or flexible sigmoidoscopy):  Excessive amounts of blood in the stool  Significant tenderness or worsening of abdominal pains  Swelling of the abdomen that is new, acute  Fever of 100F or higher  For urgent or emergent issues, a gastroenterologist can be reached at any hour by calling [phone]. Do not use MyChart messaging for urgent concerns.    DIET:  We do recommend a small meal at first, but then you may  proceed to your regular diet.  Drink plenty of fluids but you should avoid alcoholic beverages for 24 hours.  ACTIVITY:  You should plan to take it easy for the rest of today and you should NOT DRIVE or use heavy machinery until tomorrow (because of the sedation medicines used during the test).    FOLLOW UP: Our staff will call the number listed on your records the next business day following your procedure.  We will call around 7:15- 8:00 am to check on you and address any questions or concerns that you may have regarding the information given to you following your procedure. If we do not reach you, we will leave a message.     If any biopsies were taken you will be contacted by phone or by letter within the next 1-3 weeks.  Please call us at [phone] if you have not heard about the biopsies in 3 weeks.    SIGNATURES/CONFIDENTIALITY: You and/or your care partner have signed paperwork which will be entered into your electronic medical record.  These signatures attest to the fact that that the information above on your After Visit Summary has been reviewed and is understood.  Full responsibility of the confidentiality of this discharge information lies with you and/or your care-partner.

## 2022-11-18 NOTE — Progress Notes (Signed)
GASTROENTEROLOGY PROCEDURE H&P NOTE   Primary Care Physician: Myrlene Broker, MD    Reason for Procedure:  Colon cancer screening  Plan:    Colonoscopy  Patient is appropriate for endoscopic procedure(s) in the ambulatory ([facility]) setting.  The nature of the procedure, as well as the risks, benefits, and alternatives were carefully and thoroughly reviewed with the patient. Ample time for discussion and questions allowed. The patient understood, was satisfied, and agreed to proceed.     HPI: Erin [name] is a 62 y.o. female who presents for screening colonoscopy.  Medical history as below.  Tolerated the prep.  No recent chest pain or shortness of breath.  No abdominal pain today.  Past Medical History:  Diagnosis Date   Allergic rhinitis due to pollen    Breast cancer (HCC) 2010   Breast cancer (HCC) 2016   reoccurrence in scar   Diverticulosis    History of radiation therapy 04/26/19- 05/05/19   Right ischium 5 fractions of 10 Gy each to total 50 Gy.    Hypertension    Hypothyroidism    Osteopenia    S/P radiation therapy 07/12/2014 through 08/16/2014                                                        Left chest wall 4000 cGy in 20 sessions, left chest wall boost 1000 cGy in 5 sessions     Wears glasses     Past Surgical History:  Procedure Laterality Date   BREAST LUMPECTOMY Left 06/01/2014   Procedure: WIDE LOCAL EXCISION OF RECURRENT LEFT BREAST CANCER;  Surgeon: Emelia Loron, MD;  Location: [facility];  Service: General;  Laterality: Left;   COLONOSCOPY     cyst removed     left lower jaw - at age 89 yr   DILATATION & CURRETTAGE/HYSTEROSCOPY WITH RESECTOCOPE N/A 09/16/2013   Procedure: DILATATION & CURETTAGE/HYSTEROSCOPY WITH RESECTOCOPE;  Surgeon: Genia Del, MD;  Location: [facility];  Service: Gynecology;  Laterality: N/A;  1 hr.   INGUINAL HERNIA REPAIR     right side as infant   LAPAROSCOPIC PELVIC LYMPH NODE BIOPSY      MASTECTOMY  2010   bilateral mastectomies-lt snbx-radiation post   MASTECTOMY Bilateral    OOPHORECTOMY  2016   patient denies   Right knee arthroscopy  2008   ROBOTIC ASSISTED BILATERAL SALPINGO OOPHERECTOMY Bilateral 03/13/2015   Procedure: ROBOTIC ASSISTED BILATERAL SALPINGO OOPHORECTOMY With Washings;  Surgeon: Genia Del, MD;  Location: [facility];  Service: Gynecology;  Laterality: Bilateral;   TONSILLECTOMY     WISDOM TOOTH EXTRACTION      Prior to Admission medications   Medication Sig Start Date End Date Taking? Authorizing Provider  Ascorbic Acid (VITAMIN C PO) Take by mouth.   Yes [provider]  Cholecalciferol (VITAMIN D3) 2000 UNITS capsule Take 2,000 Units by mouth daily.   Yes [provider]  Ciclopirox 1 % shampoo Use topically on scalp 06/20/21  Yes Myrlene Broker, MD  fexofenadine (ALLEGRA) 180 MG tablet Take 180 mg by mouth daily as needed for allergies or rhinitis.   Yes [provider]  levothyroxine (SYNTHROID) 75 MCG tablet TAKE 1 TABLET(75 MCG) BY MOUTH DAILY BEFORE BREAKFAST 06/28/22  Yes Myrlene Broker, MD  losartan (COZAAR) 100 MG tablet TAKE  1 TABLET(100 MG) BY MOUTH DAILY 06/28/22  Yes Myrlene Broker, MD  Mometasone Furoate 370 MCG IMPL Apply 1 Application topically as directed. Five nights a week--0.1% topical solution for scalp   Yes [provider]  Omega-3 Fatty Acids (FISH OIL PO) Take by mouth.   Yes [provider]  Polyethyl Glycol-Propyl Glycol (SYSTANE OP) Place 1 drop into both eyes daily as needed (for allergies).    Yes [provider]  sodium chloride (OCEAN) 0.65 % SOLN nasal spray Place 1 spray into both nostrils as needed for congestion.   Yes [provider]  Turmeric 500 MG CAPS Take 500 mg by mouth daily.    Yes [provider]  acyclovir ointment (ZOVIRAX) 5 % Apply 1 Application topically every 3 (three) hours. As needed 06/28/22   Myrlene Broker, MD  AUVI-Q 0.3 MG/0.3ML SOAJ injection auto-injector into outer thigh Injection prn anaphylaxis for 30 days    [provider]  ibuprofen (ADVIL,MOTRIN) 200 MG tablet Take 200 mg by mouth every 6 (six) hours as needed for moderate pain.    [provider]  Olopatadine HCl (PATADAY) 0.7 % SOLN Apply to eye.    [provider]  Olopatadine HCl 0.6 % SOLN Place 2 sprays into both nostrils 2 (two) times daily as needed (allergies).  02/25/19   [provider]  pyrithione zinc (SELSUN BLUE DRY SCALP) 1 % shampoo 1 application in place of regular shampoo Externally Two times a Week    [provider]    Current Outpatient Medications  Medication Sig Dispense Refill   Ascorbic Acid (VITAMIN C PO) Take by mouth.     Cholecalciferol (VITAMIN D3) 2000 UNITS capsule Take 2,000 Units by mouth daily.     Ciclopirox 1 % shampoo Use topically on scalp 120 mL 0   fexofenadine (ALLEGRA) 180 MG tablet Take 180 mg by mouth daily as needed for allergies or rhinitis.     levothyroxine (SYNTHROID) 75 MCG tablet TAKE 1 TABLET(75 MCG) BY MOUTH DAILY BEFORE BREAKFAST 90 tablet 3   losartan (COZAAR) 100 MG tablet TAKE 1 TABLET(100 MG) BY MOUTH DAILY 90 tablet 3   Mometasone Furoate 370 MCG IMPL Apply 1 Application topically as directed. Five nights a week--0.1% topical solution for scalp     Omega-3 Fatty Acids (FISH OIL PO) Take by mouth.     Polyethyl Glycol-Propyl Glycol (SYSTANE OP) Place 1 drop into both eyes daily as needed (for allergies).      sodium chloride (OCEAN) 0.65 % SOLN nasal spray Place 1 spray into both nostrils as needed for congestion.     Turmeric 500 MG CAPS Take 500 mg by mouth daily.      acyclovir ointment (ZOVIRAX) 5 % Apply 1 Application topically every 3 (three) hours. As needed 5 g 5   AUVI-Q 0.3 MG/0.3ML SOAJ injection auto-injector into outer thigh Injection prn anaphylaxis for 30 days     ibuprofen (ADVIL,MOTRIN) 200 MG tablet Take 200 mg  by mouth every 6 (six) hours as needed for moderate pain.     Olopatadine HCl (PATADAY) 0.7 % SOLN Apply to eye.     Olopatadine HCl 0.6 % SOLN Place 2 sprays into both nostrils 2 (two) times daily as needed (allergies).      pyrithione zinc (SELSUN BLUE DRY SCALP) 1 % shampoo 1 application in place of regular shampoo Externally Two times a Week     Current Facility-Administered Medications  Medication Dose Route  Frequency Provider Last Rate Last Admin   0.9 %  sodium chloride infusion  500 mL Intravenous Once Aubrianne Molyneux, Carie Caddy, MD        Allergies as of 11/18/2022 - Review Complete 11/18/2022  Allergen Reaction Noted   Pollen extract  06/16/2014   Adhesive [tape] Dermatitis and Rash 03/08/2011    Family History  Problem Relation Age of Onset   Hypertension Mother    Cancer Mother        Lung Cancer - long history of tobacco use   Hypertension Father    Stroke Maternal Grandfather    Stroke Paternal Grandfather    Hypertension Brother    Colon cancer Neg Hx    Stomach cancer Neg Hx     Social History   Socioeconomic History   Marital status: Single    Spouse name: Not on file   Number of children: 0   Years of education: PhD   Highest education level: Not on file  Occupational History   Not on file  Tobacco Use   Smoking status: Never   Smokeless tobacco: Never  Vaping Use   Vaping status: Never Used  Substance and Sexual Activity   Alcohol use: No    Alcohol/week: 0.0 standard drinks of alcohol    Comment: rare   Drug use: No   Sexual activity: Not Currently    Birth control/protection: None    Comment: did not answer high risk questions  Other Topics Concern   Not on file  Social History Narrative   She grew up in [place]   Spent last 16 yrs in [place]   Rare caffeine use    Social Determinants of Health   Financial Resource Strain: Not on file  Food Insecurity: Not on file  Transportation Needs: No Transportation Needs (03/31/2019)   PRAPARE -  Administrator, Civil Service (Medical): No    Lack of Transportation (Non-Medical): No  Physical Activity: Not on file  Stress: Not on file  Social Connections: Not on file  Intimate Partner Violence: Not At Risk (03/31/2019)   Humiliation, Afraid, Rape, and Kick questionnaire    Fear of Current or Ex-Partner: No    Emotionally Abused: No    Physically Abused: No    Sexually Abused: No    Physical Exam: Vital signs in last 24 hours: @BP  132/86   Pulse 82   Temp 98.2 F (36.8 C) (Temporal)   Ht 5\' 5"  (1.651 m)   Wt 148 lb (67.1 kg)   LMP  (LMP Unknown)   SpO2 97%   BMI 24.63 kg/m  GEN: NAD EYE: Sclerae anicteric ENT: MMM CV: Non-tachycardic Pulm: CTA b/l GI: Soft, NT/ND NEURO:  Alert & Oriented x 3   Erick Blinks, MD [facility]  11/18/2022 1:38 PM

## 2022-11-19 ENCOUNTER — Telehealth: Payer: Self-pay

## 2022-11-19 NOTE — Telephone Encounter (Signed)
  Follow up Call-     11/18/2022    1:00 PM  Call back number  Post procedure Call Back phone  # [phone]  Permission to leave phone message Yes     Patient questions:  Do you have a fever, pain , or abdominal swelling? No. Pain Score  0 *  Have you tolerated food without any problems? Yes.    Have you been able to return to your normal activities? Yes.    Do you have any questions about your discharge instructions: Diet   No. Medications  No. Follow up visit  No.  Do you have questions or concerns about your Care? No.  Actions: * If pain score is 4 or above: No action needed, pain <4.

## 2022-11-28 ENCOUNTER — Encounter: Payer: BC Managed Care – PPO | Admitting: Internal Medicine

## 2023-01-17 ENCOUNTER — Ambulatory Visit: Payer: BC Managed Care – PPO

## 2023-01-17 DIAGNOSIS — Z23 Encounter for immunization: Secondary | ICD-10-CM | POA: Diagnosis not present

## 2023-01-17 NOTE — Progress Notes (Signed)
Patient presented in office today for her Flu Vaccine. Regular Dose Flu Vaccine was administered into her Right Deltoid Muscle. Patient tolerated injection well and injection site looked normal. Patient was advised to report to the office if she noticed any adverse reactions.

## 2023-01-30 ENCOUNTER — Encounter: Payer: Self-pay | Admitting: Oncology

## 2023-01-30 ENCOUNTER — Inpatient Hospital Stay: Payer: BC Managed Care – PPO | Attending: Hematology and Oncology

## 2023-01-30 DIAGNOSIS — Z853 Personal history of malignant neoplasm of breast: Secondary | ICD-10-CM | POA: Insufficient documentation

## 2023-01-30 DIAGNOSIS — Z170 Estrogen receptor positive status [ER+]: Secondary | ICD-10-CM

## 2023-01-30 DIAGNOSIS — Z08 Encounter for follow-up examination after completed treatment for malignant neoplasm: Secondary | ICD-10-CM | POA: Insufficient documentation

## 2023-01-30 LAB — GENETIC SCREENING ORDER

## 2023-02-04 ENCOUNTER — Telehealth: Payer: Self-pay | Admitting: Hematology and Oncology

## 2023-02-04 NOTE — Telephone Encounter (Signed)
Left patient a message regarding upcoming appointment times/.dates

## 2023-02-07 ENCOUNTER — Telehealth: Payer: Self-pay | Admitting: *Deleted

## 2023-02-07 LAB — SIGNATERA
SIGNATERA MTM READOUT: 0.07 MTM/ml — AB
SIGNATERA TEST RESULT: POSITIVE — AB

## 2023-02-07 NOTE — Telephone Encounter (Signed)
RN placed call to pt regarding recent Signatera result coming back positive at 0.07.  Per MD if pt would prefer, we can order staging scans wit CT CAP and bone scan or continue to monitor.  RN attempt x1 to return call.  No answer, LVM for pt to return call to the office.

## 2023-02-10 ENCOUNTER — Telehealth: Payer: Self-pay

## 2023-02-10 NOTE — Telephone Encounter (Signed)
Pt called with many questions and concerns regarding signatera testing results. Many of the questions the pt had are not appropriate to be answered by nursing. She also verbalized concerns about pain to her surgical site. Erin [name] was offered an appt with Dr Pamelia Hoit 02/20/23 at 0945 and she accepted this appt. Total minutes with pt was 24 mins.

## 2023-02-15 ENCOUNTER — Other Ambulatory Visit (HOSPITAL_COMMUNITY): Payer: Self-pay

## 2023-02-15 MED ORDER — COVID-19 MRNA VAC-TRIS(PFIZER) 30 MCG/0.3ML IM SUSY
0.3000 mL | PREFILLED_SYRINGE | Freq: Once | INTRAMUSCULAR | 0 refills | Status: AC
  Filled 2023-02-15: qty 0.3, 1d supply, fill #0

## 2023-02-20 ENCOUNTER — Inpatient Hospital Stay: Payer: BC Managed Care – PPO | Admitting: Hematology and Oncology

## 2023-02-20 VITALS — BP 146/60 | HR 86 | Temp 97.2°F | Resp 18 | Ht 65.0 in | Wt 150.2 lb

## 2023-02-20 DIAGNOSIS — Z08 Encounter for follow-up examination after completed treatment for malignant neoplasm: Secondary | ICD-10-CM | POA: Diagnosis present

## 2023-02-20 DIAGNOSIS — Z170 Estrogen receptor positive status [ER+]: Secondary | ICD-10-CM

## 2023-02-20 DIAGNOSIS — C50412 Malignant neoplasm of upper-outer quadrant of left female breast: Secondary | ICD-10-CM

## 2023-02-20 DIAGNOSIS — Z853 Personal history of malignant neoplasm of breast: Secondary | ICD-10-CM | POA: Diagnosis not present

## 2023-02-20 MED ORDER — ANASTROZOLE 1 MG PO TABS: 1.0000 mg | ORAL_TABLET | Freq: Every day | ORAL | 3 refills | Status: DC

## 2023-02-20 NOTE — Progress Notes (Addendum)
Patient Care Team: Myrlene Broker, MD as PCP - General (Internal Medicine) Emelia Loron, MD as Consulting Physician (General Surgery) Genia Del, MD as Consulting Physician (Obstetrics and Gynecology) Rhea Belton, Carie Caddy, MD as Consulting Physician (Gastroenterology) Huston Foley, MD as Attending Physician (Neurology) Irene Limbo, DDS as Referring Physician (Dentistry) Judi Saa, DO as Consulting Physician (Family Medicine) Elmon Else, MD as Consulting Physician (Dermatology) Lonie Peak, MD as Attending Physician (Radiation Oncology) Irene Limbo, DDS as Referring Physician (Dentistry)  DIAGNOSIS:  Encounter Diagnosis  Name Primary?   Malignant neoplasm of upper-outer quadrant of left breast in female, estrogen receptor positive (HCC) Yes      CHIEF COMPLIANT: Follow-up to discuss results of Signatera tests   History of Present Illness   The patient, with a history of breast cancer, presents with concerns about recent positive Signatera results. The patient completed seven years of anastrozole, a hormone therapy for breast cancer, last year. The first three Signatera tests were negative, but the last two tests have come back positive, indicating the presence of circulating tumor DNA. The patient reports no new symptoms related to the breast cancer.  The patient has osteopenia and is considering restarting Prolia, a medication for bone loss, pending dental clearance. The patient has a dental issue that is being monitored and may require a root canal in the future. The patient's bone density was last checked a year ago, and the score for her hips was -1.9.        ALLERGIES:  is allergic to pollen extract and adhesive [tape].  MEDICATIONS:  Current Outpatient Medications  Medication Sig Dispense Refill   anastrozole (ARIMIDEX) 1 MG tablet Take 1 tablet (1 mg total) by mouth daily. 90 tablet 3   acyclovir ointment (ZOVIRAX) 5 % Apply 1 Application  topically every 3 (three) hours. As needed 5 g 5   Ascorbic Acid (VITAMIN C PO) Take by mouth.     AUVI-Q 0.3 MG/0.3ML SOAJ injection auto-injector into outer thigh Injection prn anaphylaxis for 30 days     Cholecalciferol (VITAMIN D3) 2000 UNITS capsule Take 2,000 Units by mouth daily.     Ciclopirox 1 % shampoo Use topically on scalp 120 mL 0   fexofenadine (ALLEGRA) 180 MG tablet Take 180 mg by mouth daily as needed for allergies or rhinitis.     ibuprofen (ADVIL,MOTRIN) 200 MG tablet Take 200 mg by mouth every 6 (six) hours as needed for moderate pain.     levothyroxine (SYNTHROID) 75 MCG tablet TAKE 1 TABLET(75 MCG) BY MOUTH DAILY BEFORE BREAKFAST 90 tablet 3   losartan (COZAAR) 100 MG tablet TAKE 1 TABLET(100 MG) BY MOUTH DAILY 90 tablet 3   Mometasone Furoate 370 MCG IMPL Apply 1 Application topically as directed. Five nights a week--0.1% topical solution for scalp     Olopatadine HCl (PATADAY) 0.7 % SOLN Apply to eye.     Olopatadine HCl 0.6 % SOLN Place 2 sprays into both nostrils 2 (two) times daily as needed (allergies).      Omega-3 Fatty Acids (FISH OIL PO) Take by mouth.     Polyethyl Glycol-Propyl Glycol (SYSTANE OP) Place 1 drop into both eyes daily as needed (for allergies).      pyrithione zinc (SELSUN BLUE DRY SCALP) 1 % shampoo 1 application in place of regular shampoo Externally Two times a Week     sodium chloride (OCEAN) 0.65 % SOLN nasal spray Place 1 spray into both nostrils as needed for congestion.  Turmeric 500 MG CAPS Take 500 mg by mouth daily.      No current facility-administered medications for this visit.    PHYSICAL EXAMINATION: ECOG PERFORMANCE STATUS: 1 - Symptomatic but completely ambulatory  Vitals:   02/20/23 0946  BP: (!) 146/60  Pulse: 86  Resp: 18  Temp: (!) 97.2 F (36.2 C)  SpO2: 99%   Filed Weights   02/20/23 0946  Weight: 150 lb 3.2 oz (68.1 kg)    Physical Exam   BREAST: No lumps, nodules, or evidence of recurrence.       (exam performed in the presence of a chaperone)  LABORATORY DATA:  I have reviewed the data as listed    Latest Ref Rng & Units 07/31/2022    9:36 AM 08/28/2021    2:26 PM 02/15/2021    2:22 PM  CMP  Glucose 70 - 99 mg/dL 77  161  096   BUN 6 - 23 mg/dL 20  22  22    Creatinine 0.40 - 1.20 mg/dL 0.45  4.09  8.11   Sodium 135 - 145 mEq/L 135  135  137   Potassium 3.5 - 5.1 mEq/L 4.3  4.3  4.6   Chloride 96 - 112 mEq/L 99  101  102   CO2 19 - 32 mEq/L 30  30  23    Calcium 8.4 - 10.5 mg/dL 9.7  9.7  9.9   Total Protein 6.0 - 8.3 g/dL 6.8  7.0  7.2   Total Bilirubin 0.2 - 1.2 mg/dL 1.4  1.1  1.4   Alkaline Phos 39 - 117 U/L 91  61  88   AST 0 - 37 U/L 20  17  19    ALT 0 - 35 U/L 13  10  13      Lab Results  Component Value Date   WBC 4.1 07/31/2022   HGB 15.3 (H) 07/31/2022   HCT 44.8 07/31/2022   MCV 89.3 07/31/2022   PLT 224.0 07/31/2022   NEUTROABS 4.2 08/28/2021    ASSESSMENT & PLAN:  Malignant neoplasm of upper-outer quadrant of left breast in female, 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 Oncotype DX: 17, 10 to 12% distant recurrence risk over 10 years Post mastectomy radiation February 2010-May 2015: Tamoxifen stopped because of endometrial hyperplasia 04/21/2014: Recurrence: Skin biopsy adenocarcinoma ER/PR positive 06/01/2014: Wide excision with negative margins 07/12/2014-08/16/2014: Chest wall radiation 05/06/2014: PET/CT: Activity in the right ischial tuberosity: Biopsy negative Repeat biopsy: Negative 05/09/2014-02/20/2015: Goserelin 03/13/2015: BSO: Benign 04/26/2019-05/05/2019: SBRT to right ischial tuberosity lesion 08/29/2014-08/28/2021: Anastrozole stopped after 7 years   Prior treatment: Anastrozole started 08/29/2014 -08/28/2021 along with Prolia for osteoporosis (both have been discontinued)     Surveillance: Signatera blood testing for circulating cell free DNA 10/2022:  Signatera positive (0.02 MTM/ml) 02/07/2023: Signatera positive (0.07 MTM/ml)   I recommend obtaining CT chest abdomen pelvis and bone scan for further assessment. I would like to restart anastrozole. bone density November 2023: T-score -1.9   We would like her to get dental clearance for Prolia  Follow-up in 2 weeks to discuss results of the scans.   Orders Placed This Encounter  Procedures   CT CHEST ABDOMEN PELVIS W CONTRAST    Standing Status:   Future    Standing Expiration Date:   02/20/2024    Order Specific Question:   If indicated for the ordered procedure, I authorize the administration of contrast media per Radiology  protocol    Answer:   Yes    Order Specific Question:   Does the patient have a contrast media/X-ray dye allergy?    Answer:   Yes    Order Specific Question:   Preferred imaging location?    Answer:   [facility]    Order Specific Question:   Release to patient    Answer:   Immediate    Order Specific Question:   If indicated for the ordered procedure, I authorize the administration of oral contrast media per Radiology protocol    Answer:   Yes   NM Bone Scan Whole Body    Standing Status:   Future    Standing Expiration Date:   02/20/2024    Order Specific Question:   If indicated for the ordered procedure, I authorize the administration of a radiopharmaceutical per Radiology protocol    Answer:   Yes    Order Specific Question:   Preferred imaging location?    Answer:   [facility]    Order Specific Question:   Release to patient    Answer:   Immediate   The patient has a good understanding of the overall plan. she agrees with it. she will call with any problems that may develop before the next visit here. Total time spent: 30 mins including face to face time and time spent for planning, charting and co-ordination of care   Tamsen Meek, MD 02/20/23

## 2023-02-20 NOTE — Assessment & Plan Note (Signed)
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 Oncotype DX: 17, 10 to 12% distant recurrence risk over 10 years Post mastectomy radiation February 2010-May 2015: Tamoxifen stopped because of endometrial hyperplasia 04/21/2014: Recurrence: Skin biopsy adenocarcinoma ER/PR positive 06/01/2014: Wide excision with negative margins 07/12/2014-08/16/2014: Chest wall radiation 05/06/2014: PET/CT: Activity in the right ischial tuberosity: Biopsy negative Repeat biopsy: Negative 05/09/2014-02/20/2015: Goserelin 03/13/2015: BSO: Benign 04/26/2019-05/05/2019: SBRT to right ischial tuberosity lesion 08/29/2014-08/28/2021: Anastrozole stopped after 7 years   Prior treatment: Anastrozole started 08/29/2014 -08/28/2021 along with Prolia for osteoporosis (both have been discontinued)     Surveillance: Signatera blood testing for circulating cell free DNA 10/2022: Signatera positive (0.02 MTM/ml) 02/07/2023: Signatera positive (0.07 MTM/ml)   I recommend obtaining CT chest abdomen pelvis and bone scan for further assessment. I would like to restart anastrozole. bone density November 2023: T-score -1.2   Follow-up after the next Signatera result in 3 months

## 2023-02-21 ENCOUNTER — Encounter: Payer: Self-pay | Admitting: Hematology and Oncology

## 2023-02-21 ENCOUNTER — Other Ambulatory Visit: Payer: Self-pay

## 2023-02-21 DIAGNOSIS — M8580 Other specified disorders of bone density and structure, unspecified site: Secondary | ICD-10-CM

## 2023-02-21 DIAGNOSIS — Z170 Estrogen receptor positive status [ER+]: Secondary | ICD-10-CM

## 2023-02-26 ENCOUNTER — Other Ambulatory Visit (HOSPITAL_COMMUNITY): Payer: BC Managed Care – PPO

## 2023-02-27 ENCOUNTER — Other Ambulatory Visit: Payer: BC Managed Care – PPO | Attending: Hematology and Oncology

## 2023-02-27 ENCOUNTER — Other Ambulatory Visit: Payer: Self-pay

## 2023-02-27 DIAGNOSIS — R978 Other abnormal tumor markers: Secondary | ICD-10-CM | POA: Diagnosis not present

## 2023-02-27 DIAGNOSIS — Z170 Estrogen receptor positive status [ER+]: Secondary | ICD-10-CM | POA: Diagnosis not present

## 2023-02-27 DIAGNOSIS — M8580 Other specified disorders of bone density and structure, unspecified site: Secondary | ICD-10-CM

## 2023-02-27 DIAGNOSIS — Z79811 Long term (current) use of aromatase inhibitors: Secondary | ICD-10-CM | POA: Diagnosis not present

## 2023-02-27 DIAGNOSIS — C792 Secondary malignant neoplasm of skin: Secondary | ICD-10-CM | POA: Diagnosis not present

## 2023-02-27 DIAGNOSIS — C50412 Malignant neoplasm of upper-outer quadrant of left female breast: Secondary | ICD-10-CM | POA: Diagnosis present

## 2023-02-27 LAB — CMP (CANCER CENTER ONLY)
ALT: 11 U/L (ref 0–44)
AST: 17 U/L (ref 15–41)
Albumin: 4.1 g/dL (ref 3.5–5.0)
Alkaline Phosphatase: 93 U/L (ref 38–126)
Anion gap: 3 — ABNORMAL LOW (ref 5–15)
BUN: 25 mg/dL — ABNORMAL HIGH (ref 8–23)
CO2: 30 mmol/L (ref 22–32)
Calcium: 9.4 mg/dL (ref 8.9–10.3)
Chloride: 101 mmol/L (ref 98–111)
Creatinine: 0.87 mg/dL (ref 0.44–1.00)
GFR, Estimated: >60 mL/min (ref >60)
Glucose, Bld: 85 mg/dL (ref 70–99)
Potassium: 4.4 mmol/L (ref 3.5–5.1)
Sodium: 134 mmol/L — ABNORMAL LOW (ref 135–145)
Total Bilirubin: 1.1 mg/dL (ref <1.2)
Total Protein: 6.7 g/dL (ref 6.5–8.1)

## 2023-02-27 LAB — CBC WITH DIFFERENTIAL (CANCER CENTER ONLY)
Abs Immature Granulocytes: 0.01 10*3/uL (ref 0.00–0.07)
Basophils Absolute: 0 10*3/uL (ref 0.0–0.1)
Basophils Relative: 1 %
Eosinophils Absolute: 0.1 10*3/uL (ref 0.0–0.5)
Eosinophils Relative: 1 %
HCT: 41.1 % (ref 36.0–46.0)
Hemoglobin: 14.4 g/dL (ref 12.0–15.0)
Immature Granulocytes: 0 %
Lymphocytes Relative: 27 %
Lymphs Abs: 1.5 10*3/uL (ref 0.7–4.0)
MCH: 30.6 pg (ref 26.0–34.0)
MCHC: 35 g/dL (ref 30.0–36.0)
MCV: 87.4 fL (ref 80.0–100.0)
Monocytes Absolute: 0.5 10*3/uL (ref 0.1–1.0)
Monocytes Relative: 9 %
Neutro Abs: 3.4 10*3/uL (ref 1.7–7.7)
Neutrophils Relative %: 62 %
Platelet Count: 206 10*3/uL (ref 150–400)
RBC: 4.7 MIL/uL (ref 3.87–5.11)
RDW: 11.6 % (ref 11.5–15.5)
WBC Count: 5.4 10*3/uL (ref 4.0–10.5)
nRBC: 0 % (ref 0.0–0.2)

## 2023-03-03 ENCOUNTER — Encounter (HOSPITAL_COMMUNITY)
Admission: RE | Admit: 2023-03-03 | Discharge: 2023-03-03 | Disposition: A | Discharge status: Home / Self Care | Payer: BC Managed Care – PPO | Source: Ambulatory Visit | Attending: Hematology and Oncology | Admitting: Hematology and Oncology

## 2023-03-03 ENCOUNTER — Ambulatory Visit (HOSPITAL_COMMUNITY)
Admission: RE | Admit: 2023-03-03 | Discharge: 2023-03-03 | Disposition: A | Discharge status: Home / Self Care | Payer: BC Managed Care – PPO | Source: Ambulatory Visit | Attending: Hematology and Oncology | Admitting: Hematology and Oncology

## 2023-03-03 DIAGNOSIS — Z170 Estrogen receptor positive status [ER+]: Secondary | ICD-10-CM | POA: Diagnosis present

## 2023-03-03 DIAGNOSIS — C50412 Malignant neoplasm of upper-outer quadrant of left female breast: Secondary | ICD-10-CM | POA: Diagnosis present

## 2023-03-03 MED ORDER — TECHNETIUM TC 99M MEDRONATE IV KIT
20.0000 | PACK | Freq: Once | INTRAVENOUS | Status: AC | PRN
  Administered 2023-03-03: 20 via INTRAVENOUS

## 2023-03-03 MED ORDER — IOHEXOL 350 MG/ML SOLN
75.0000 mL | Freq: Once | INTRAVENOUS | Status: AC | PRN
  Administered 2023-03-03: 75 mL via INTRAVENOUS

## 2023-03-07 ENCOUNTER — Ambulatory Visit: Payer: BC Managed Care – PPO | Admitting: Hematology and Oncology

## 2023-03-11 NOTE — Assessment & Plan Note (Signed)
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 Oncotype DX: 17, 10 to 12% distant recurrence risk over 10 years Post mastectomy radiation February 2010-May 2015: Tamoxifen stopped because of endometrial hyperplasia 04/21/2014: Recurrence: Skin biopsy adenocarcinoma ER/PR positive 06/01/2014: Wide excision with negative margins 07/12/2014-08/16/2014: Chest wall radiation 05/06/2014: PET/CT: Activity in the right ischial tuberosity: Biopsy negative Repeat biopsy: Negative 05/09/2014-02/20/2015: Goserelin 03/13/2015: BSO: Benign 04/26/2019-05/05/2019: SBRT to right ischial tuberosity lesion 08/29/2014-08/28/2021: Anastrozole stopped after 7 years   Prior treatment: Anastrozole started 08/29/2014 -08/28/2021 along with Prolia for osteoporosis (both have been discontinued)     Surveillance: Signatera blood testing for circulating cell free DNA 10/2022: Signatera positive (0.02 MTM/ml) 02/07/2023: [place] Nail positive (0.07 MTM/ml)   CT CAP and bone scan 03/03/2023:  I would like to restart anastrozole. bone density November 2023: T-score -1.9   We would like her to get dental clearance for Prolia   Follow-up in 2 weeks to discuss results of the scans.

## 2023-03-12 ENCOUNTER — Encounter: Payer: Self-pay | Admitting: *Deleted

## 2023-03-12 ENCOUNTER — Inpatient Hospital Stay: Payer: BC Managed Care – PPO | Admitting: Hematology and Oncology

## 2023-03-12 VITALS — BP 139/64 | HR 79 | Temp 98.6°F | Resp 18 | Ht 65.0 in | Wt 151.0 lb

## 2023-03-12 DIAGNOSIS — Z170 Estrogen receptor positive status [ER+]: Secondary | ICD-10-CM

## 2023-03-12 DIAGNOSIS — C50412 Malignant neoplasm of upper-outer quadrant of left female breast: Secondary | ICD-10-CM | POA: Diagnosis not present

## 2023-03-12 NOTE — Progress Notes (Signed)
Patient Care Team: Myrlene Broker, MD as PCP - General (Internal Medicine) Emelia Loron, MD as Consulting Physician (General Surgery) Genia Del, MD as Consulting Physician (Obstetrics and Gynecology) Rhea Belton, Carie Caddy, MD as Consulting Physician (Gastroenterology) Huston Foley, MD as Attending Physician (Neurology) Irene Limbo, DDS as Referring Physician (Dentistry) Judi Saa, DO as Consulting Physician (Family Medicine) Elmon Else, MD as Consulting Physician (Dermatology) Lonie Peak, MD as Attending Physician (Radiation Oncology) Irene Limbo, DDS as Referring Physician (Dentistry)  DIAGNOSIS:  Encounter Diagnosis  Name Primary?   Malignant neoplasm of upper-outer quadrant of left breast in female, estrogen receptor positive (HCC) Yes   CHIEF COMPLIANT: F/U of scans  HISTORY OF PRESENT ILLNESS:  History of Present Illness   Erin [name], a patient with a history of breast cancer, presents for a follow-up appointment after recent scans. She reports that her scans showed no visible evidence of cancer, which she is relieved about. However, she expresses concern about an abnormality in her ischial tuberosity, which some radiologists have suggested could be a possible metastasis.  Erin [name] is currently undergoing treatment with Anastrozole due to a positive Signatera test, which detects traces of cancer DNA in the bloodstream. She reports experiencing hot flashes since starting the medication, but describes them as manageable. She also mentions having some dry eye symptoms, which she has heard can be associated with Anastrozole.  Erin [name] also expresses concern about a finding of aortic atherosclerosis on her scans. She has a family history of heart problems and is worried about her risk.         ALLERGIES:  is allergic to pollen extract and adhesive [tape].  MEDICATIONS:  Current Outpatient Medications  Medication Sig Dispense Refill   acyclovir ointment  (ZOVIRAX) 5 % Apply 1 Application topically every 3 (three) hours. As needed 5 g 5   anastrozole (ARIMIDEX) 1 MG tablet Take 1 tablet (1 mg total) by mouth daily. 90 tablet 3   Ascorbic Acid (VITAMIN C PO) Take by mouth.     AUVI-Q 0.3 MG/0.3ML SOAJ injection auto-injector into outer thigh Injection prn anaphylaxis for 30 days     Cholecalciferol (VITAMIN D3) 2000 UNITS capsule Take 2,000 Units by mouth daily.     Ciclopirox 1 % shampoo Use topically on scalp 120 mL 0   fexofenadine (ALLEGRA) 180 MG tablet Take 180 mg by mouth daily as needed for allergies or rhinitis.     ibuprofen (ADVIL,MOTRIN) 200 MG tablet Take 200 mg by mouth every 6 (six) hours as needed for moderate pain.     levothyroxine (SYNTHROID) 75 MCG tablet TAKE 1 TABLET(75 MCG) BY MOUTH DAILY BEFORE BREAKFAST 90 tablet 3   losartan (COZAAR) 100 MG tablet TAKE 1 TABLET(100 MG) BY MOUTH DAILY 90 tablet 3   Mometasone Furoate 370 MCG IMPL Apply 1 Application topically as directed. Five nights a week--0.1% topical solution for scalp     Olopatadine HCl (PATADAY) 0.7 % SOLN Apply to eye.     Olopatadine HCl 0.6 % SOLN Place 2 sprays into both nostrils 2 (two) times daily as needed (allergies).      Omega-3 Fatty Acids (FISH OIL PO) Take by mouth.     Polyethyl Glycol-Propyl Glycol (SYSTANE OP) Place 1 drop into both eyes daily as needed (for allergies).      pyrithione zinc (SELSUN BLUE DRY SCALP) 1 % shampoo 1 application in place of regular shampoo Externally Two times a Week     sodium chloride (OCEAN) 0.65 % SOLN nasal  spray Place 1 spray into both nostrils as needed for congestion.     Turmeric 500 MG CAPS Take 500 mg by mouth daily.      No current facility-administered medications for this visit.    PHYSICAL EXAMINATION: ECOG PERFORMANCE STATUS: 1 - Symptomatic but completely ambulatory  Vitals:   03/12/23 0941  BP: 139/64  Pulse: 79  Resp: 18  Temp: 98.6 F (37 C)  SpO2: 99%   Filed Weights   03/12/23 0941   Weight: 151 lb (68.5 kg)     LABORATORY DATA:  I have reviewed the data as listed    Latest Ref Rng & Units 02/27/2023    3:47 PM 07/31/2022    9:36 AM 08/28/2021    2:26 PM  CMP  Glucose 70 - 99 mg/dL 85  77  130   BUN 8 - 23 mg/dL 25  20  22    Creatinine 0.44 - 1.00 mg/dL 8.65  7.84  6.96   Sodium 135 - 145 mmol/L 134  135  135   Potassium 3.5 - 5.1 mmol/L 4.4  4.3  4.3   Chloride 98 - 111 mmol/L 101  99  101   CO2 22 - 32 mmol/L 30  30  30    Calcium 8.9 - 10.3 mg/dL 9.4  9.7  9.7   Total Protein 6.5 - 8.1 g/dL 6.7  6.8  7.0   Total Bilirubin <1.2 mg/dL 1.1  1.4  1.1   Alkaline Phos 38 - 126 U/L 93  91  61   AST 15 - 41 U/L 17  20  17    ALT 0 - 44 U/L 11  13  10      Lab Results  Component Value Date   WBC 5.4 02/27/2023   HGB 14.4 02/27/2023   HCT 41.1 02/27/2023   MCV 87.4 02/27/2023   PLT 206 02/27/2023   NEUTROABS 3.4 02/27/2023    ASSESSMENT & PLAN:  Malignant neoplasm of upper-outer quadrant of left breast in female, 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 Oncotype DX: 17, 10 to 12% distant recurrence risk over 10 years Post mastectomy radiation February 2010-May 2015: Tamoxifen stopped because of endometrial hyperplasia 04/21/2014: Recurrence: Skin biopsy adenocarcinoma ER/PR positive 06/01/2014: Wide excision with negative margins 07/12/2014-08/16/2014: Chest wall radiation 05/06/2014: PET/CT: Activity in the right ischial tuberosity: Biopsy negative Repeat biopsy: Negative 05/09/2014-02/20/2015: Goserelin 03/13/2015: BSO: Benign 04/26/2019-05/05/2019: SBRT to right ischial tuberosity lesion 08/29/2014-08/28/2021: Anastrozole stopped after 7 years   Prior treatment: Anastrozole started 08/29/2014 -08/28/2021 along with Prolia for osteoporosis (both have been discontinued)     Surveillance: Signatera blood testing for circulating cell free DNA 10/2022: Signatera positive (0.02  MTM/ml) 02/07/2023: [place] Nail positive (0.07 MTM/ml)   CT CAP and bone scan 03/03/2023:  I would like to restart anastrozole. bone density November 2023: T-score -1.9   We would like her to get dental clearance for Prolia   Follow-up in 2 weeks to discuss results of the scans. ------------------------------------- Assessment and Plan    History of Breast Cancer No visible evidence of cancer on recent scans. Ischial tuberosity abnormality noted but not confirmed as metastasis. Positive Signatera test indicating presence of cancer DNA in bloodstream. -Continue Anastrozole therapy. -Next Signatera blood draw scheduled for mid-January. -Plan for follow-up appointment in mid-January to coincide with Prolia administration and labs.  Atherosclerosis Incidental finding on scan. Patient has family history of heart problems. -Discuss with primary care  provider at next physical. -Consider using Coronary Artery Disease risk calculators to assess risk.  Bone Health History of treatment with Prolia. Dental work scheduled for early December. -Resume Prolia in mid-January, one month after completion of dental work.   Orders Placed This Encounter  Procedures   CBC with Differential (Cancer Center Only)    Standing Status:   Future    Standing Expiration Date:   03/11/2024   CMP (Cancer Center only)    Standing Status:   Future    Standing Expiration Date:   03/11/2024   The patient has a good understanding of the overall plan. she agrees with it. she will call with any problems that may develop before the next visit here. Total time spent: 30 mins including face to face time and time spent for planning, charting and co-ordination of care   Tamsen Meek, MD 03/12/23

## 2023-03-12 NOTE — Progress Notes (Signed)
Received Dental Clearance from pt Dentist stating there is no evidence of dental disease at this time. Letter placed in HIM folder to be scanned into pt chart.

## 2023-04-21 ENCOUNTER — Encounter: Payer: Self-pay | Admitting: Oncology

## 2023-04-22 NOTE — Progress Notes (Signed)
 62 y.o. G0P0000 Single Caucasian female here for annual exam.    Doing blood testing for prediction of potential future breast cancer.  Back on Arimidex .  Followed by Dr. Gudena.   Followed for osteopenia.   She stopped Prolia  temporarily, but will start again through Dr. Gudena.   Has some tinged vaginal discharge that is more pink in color, last noticed a couple of months ago.   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  From [place] .   PCP: Rollene Almarie LABOR, MD   No LMP recorded (lmp unknown). Patient is postmenopausal.           Sexually active: No.  The current method of family planning is abstinence/PMP.    Menopausal hormone therapy:  n/a Exercising: Yes.     Walking, elliptical Smoker:  no  OB History  Gravida Para Term Preterm AB Living  0 0 0 0 0 0  SAB IAB Ectopic Multiple Live Births  0 0 0 0 0     HEALTH MAINTENANCE: Last 2 paps:  04/05/20 neg History of abnormal Pap or positive HPV:  no Mammogram:   mastectomy  Colonoscopy:  11/18/22 - due in 10 years.  Bone Density:  02/21/22  Result  osteopenia  of spine and bilateral hips.   Immunization History  Administered Date(s) Administered   Influenza Split 01/17/2012   Influenza, High Dose Seasonal PF 07/05/2019, 07/13/2020   Influenza, Seasonal, Injecte, Preservative Fre 01/17/2023   Influenza,inj,Quad PF,6+ Mos 01/15/2013, 01/14/2014, 01/13/2015, 01/09/2017, 01/30/2018, 01/30/2019, 01/21/2020, 01/26/2021, 01/16/2022   Influenza-Unspecified 01/05/2016   MMR 06/19/2018   PFIZER Comirnaty (Gray Top)Covid-19 Tri-Sucrose Vaccine 09/29/2020, 01/25/2022   PFIZER(Purple Top)SARS-COV-2 Vaccination 07/02/2019, 07/27/2019, 02/19/2020   Pfizer Covid-19 Vaccine Bivalent Booster 13yrs & up 03/23/2021   Pfizer(Comirnaty )Fall Seasonal Vaccine 12 years and older 02/15/2023   Pneumococcal Polysaccharide-23 07/13/2020   Tdap 10/29/2016   Zoster Recombinant(Shingrix ) 09/25/2017, 12/18/2017   Zoster, Live 02/12/2013       reports that she has never smoked. She has never used smokeless tobacco. She reports that she does not drink alcohol and does not use drugs.  Past Medical History:  Diagnosis Date   Allergic rhinitis due to pollen    Breast cancer (HCC) 2010   Breast cancer (HCC) 2016   reoccurrence in scar   Diverticulosis    History of radiation therapy 04/26/19- 05/05/19   Right ischium 5 fractions of 10 Gy each to total 50 Gy.    Hypertension    Hypothyroidism    Osteopenia    S/P radiation therapy 07/12/2014 through 08/16/2014                                                        Left chest wall 4000 cGy in 20 sessions, left chest wall boost 1000 cGy in 5 sessions     Wears glasses     Past Surgical History:  Procedure Laterality Date   BREAST LUMPECTOMY Left 06/01/2014   Procedure: WIDE LOCAL EXCISION OF RECURRENT LEFT BREAST CANCER;  Surgeon: Donnice Bury, MD;  Location: [facility];  Service: General;  Laterality: Left;   COLONOSCOPY     cyst removed     left lower jaw - at age 60 yr   DILATATION & CURRETTAGE/HYSTEROSCOPY WITH RESECTOCOPE N/A 09/16/2013   Procedure: DILATATION & CURETTAGE/HYSTEROSCOPY WITH  RESECTOCOPE;  Surgeon: Marie-Lyne Lavoie, MD;  Location: [facility];  Service: Gynecology;  Laterality: N/A;  1 hr.   INGUINAL HERNIA REPAIR     right side as infant   LAPAROSCOPIC PELVIC LYMPH NODE BIOPSY     MASTECTOMY  2010   bilateral mastectomies-lt snbx-radiation post   MASTECTOMY Bilateral    OOPHORECTOMY  2016   patient denies   Right knee arthroscopy  2008   ROBOTIC ASSISTED BILATERAL SALPINGO OOPHERECTOMY Bilateral 03/13/2015   Procedure: ROBOTIC ASSISTED BILATERAL SALPINGO OOPHORECTOMY With Washings;  Surgeon: Marie-Lyne Lavoie, MD;  Location: [facility];  Service: Gynecology;  Laterality: Bilateral;   TONSILLECTOMY     WISDOM TOOTH EXTRACTION      Current Outpatient Medications  Medication Sig Dispense Refill   acyclovir  ointment (ZOVIRAX ) 5 % Apply 1  Application topically every 3 (three) hours. As needed 5 g 5   anastrozole  (ARIMIDEX ) 1 MG tablet Take 1 tablet (1 mg total) by mouth daily. 90 tablet 3   Ascorbic Acid (VITAMIN C PO) Take by mouth.     AUVI-Q  0.3 MG/0.3ML SOAJ injection auto-injector into outer thigh Injection prn anaphylaxis for 30 days     Cholecalciferol (VITAMIN D3) 2000 UNITS capsule Take 2,000 Units by mouth daily.     Ciclopirox  1 % shampoo Use topically on scalp 120 mL 0   fexofenadine (ALLEGRA) 180 MG tablet Take 180 mg by mouth daily as needed for allergies or rhinitis.     ibuprofen (ADVIL,MOTRIN) 200 MG tablet Take 200 mg by mouth every 6 (six) hours as needed for moderate pain.     levothyroxine  (SYNTHROID ) 75 MCG tablet TAKE 1 TABLET(75 MCG) BY MOUTH DAILY BEFORE BREAKFAST 90 tablet 3   losartan  (COZAAR ) 100 MG tablet TAKE 1 TABLET(100 MG) BY MOUTH DAILY 90 tablet 3   Mometasone Furoate 370 MCG IMPL Apply 1 Application topically as directed. Five nights a week--0.1% topical solution for scalp     Olopatadine HCl (PATADAY) 0.7 % SOLN Apply to eye.     Olopatadine HCl 0.6 % SOLN Place 2 sprays into both nostrils 2 (two) times daily as needed (allergies).      Omega-3 Fatty Acids (FISH OIL PO) Take by mouth.     Polyethyl Glycol-Propyl Glycol (SYSTANE OP) Place 1 drop into both eyes daily as needed (for allergies).      pyrithione zinc (SELSUN  BLUE DRY SCALP) 1 % shampoo 1 application in place of regular shampoo Externally Two times a Week     sodium chloride  (OCEAN) 0.65 % SOLN nasal spray Place 1 spray into both nostrils as needed for congestion.     Turmeric 500 MG CAPS Take 500 mg by mouth daily.      No current facility-administered medications for this visit.    ALLERGIES: Pollen extract and Adhesive [tape]  Family History  Problem Relation Age of Onset   Hypertension Mother    Cancer Mother        Lung Cancer - long history of tobacco use   Hypertension Father    Stroke Maternal Grandfather    Stroke  Paternal Grandfather    Hypertension Brother    Colon cancer Neg Hx    Stomach cancer Neg Hx     Review of Systems  All other systems reviewed and are negative.   PHYSICAL EXAM:  BP 122/80 (BP Location: Left Arm, Patient Position: Sitting, Cuff Size: Small)   Pulse 64   Ht 5' 5.5 (1.664 m)   Wt 154 lb (  69.9 kg)   LMP  (LMP Unknown)   SpO2 97%   BMI 25.24 kg/m     General appearance: alert, cooperative and appears stated age Head: normocephalic, without obvious abnormality, atraumatic Neck: no adenopathy, supple, symmetrical, trachea midline and thyroid  normal to inspection and palpation Lungs: clear to auscultation bilaterally Breasts: absent.  Scarring of left chest wall with radiation changes. Heart: regular rate and rhythm Abdomen: soft, non-tender; no masses, no organomegaly Extremities: extremities normal, atraumatic, no cyanosis or edema Skin: skin color, texture, turgor normal. No rashes or lesions Lymph nodes: cervical, supraclavicular, and axillary nodes normal. Neurologic: grossly normal  Pelvic: External genitalia:  no lesions              No abnormal inguinal nodes palpated.              Urethra:  normal appearing urethra with no masses, tenderness or lesions              Bartholins and Skenes: normal                 Vagina: normal appearing vagina with normal color and discharge, no lesions.  Atrophy noted.               Cervix: no lesions              Pap taken: Yes.   Bimanual Exam:  Uterus:  normal size, contour, position, consistency, mobility, non-tender              Adnexa: no mass, fullness, tenderness              Rectal exam: Yes.  .  Confirms.              Anus:  normal sphincter tone, no lesions  Chaperone was present for exam:  Damien FALCON, CMA  ASSESSMENT: Well woman visit with gynecologic exam Status post laparoscopic BSO.  Hx left breast cancer with recurrence.   Status post bilateral mastectomy 2010. Status post ischial tuberosity radiation  for potential bone cancer.  On Arimidex .  Negative BRCA testing.  Osteopenia.  Hx Prolia  use.  Potential postmenopausal bleeding.  Vaginal atrophy.   PLAN: Pap and HRV collected:  Yes.   Guidelines for Calcium, Vitamin D , regular exercise program including cardiovascular and weight bearing exercise. Medication refills:  NA We discussed postmenopausal bleeding and potential etiologies of atrophy, polyps, precancer and cancer.  I recommend a pelvic ultrasound.  Rationale explained.  May need endometrial biopsy based on the US  results.  Vaginal atrophy reviewed.  We reviewed tx options for atrophy:  Ky Jelly, Astroglide, Replens, vaginal vitamin E.  Follow up:  yearly for annual exam.

## 2023-05-01 ENCOUNTER — Other Ambulatory Visit: Payer: Self-pay | Admitting: Hematology and Oncology

## 2023-05-02 ENCOUNTER — Encounter: Payer: Self-pay | Admitting: Oncology

## 2023-05-06 ENCOUNTER — Encounter: Payer: Self-pay | Admitting: Obstetrics and Gynecology

## 2023-05-06 ENCOUNTER — Ambulatory Visit (INDEPENDENT_AMBULATORY_CARE_PROVIDER_SITE_OTHER): Payer: 59 | Admitting: Obstetrics and Gynecology

## 2023-05-06 ENCOUNTER — Encounter: Payer: Self-pay | Admitting: Oncology

## 2023-05-06 ENCOUNTER — Other Ambulatory Visit (HOSPITAL_COMMUNITY)
Admission: RE | Admit: 2023-05-06 | Discharge: 2023-05-06 | Disposition: A | Discharge status: Home / Self Care | Payer: 59 | Source: Ambulatory Visit | Attending: Obstetrics and Gynecology | Admitting: Obstetrics and Gynecology

## 2023-05-06 VITALS — BP 122/80 | HR 64 | Ht 65.5 in | Wt 154.0 lb

## 2023-05-06 DIAGNOSIS — N952 Postmenopausal atrophic vaginitis: Secondary | ICD-10-CM | POA: Diagnosis not present

## 2023-05-06 DIAGNOSIS — Z124 Encounter for screening for malignant neoplasm of cervix: Secondary | ICD-10-CM | POA: Diagnosis present

## 2023-05-06 DIAGNOSIS — N950 Postmenopausal bleeding: Secondary | ICD-10-CM | POA: Diagnosis not present

## 2023-05-06 DIAGNOSIS — Z01419 Encounter for gynecological examination (general) (routine) without abnormal findings: Secondary | ICD-10-CM | POA: Diagnosis not present

## 2023-05-06 NOTE — Patient Instructions (Addendum)
 EXERCISE AND DIET:  We recommended that you start or continue a regular exercise program for good health. Regular exercise means any activity that makes your heart beat faster and makes you sweat.  We recommend exercising at least 30 minutes per day at least 3 days a week, preferably 4 or 5.  We also recommend a diet low in fat and sugar.  Inactivity, poor dietary choices and obesity can cause diabetes, heart attack, stroke, and kidney damage, among others.    ALCOHOL AND SMOKING:  Women should limit their alcohol intake to no more than 7 drinks/beers/glasses of wine (combined, not each!) per week. Moderation of alcohol intake to this level decreases your risk of breast cancer and liver damage. And of course, no recreational drugs are part of a healthy lifestyle.  And absolutely no smoking or even second hand smoke. Most people know smoking can cause heart and lung diseases, but did you know it also contributes to weakening of your bones? Aging of your skin?  Yellowing of your teeth and nails?  CALCIUM AND VITAMIN D :  Adequate intake of calcium and Vitamin D  are recommended.  The recommendations for exact amounts of these supplements seem to change often, but generally speaking 600 mg of calcium (either carbonate or citrate) and 800 units of Vitamin D  per day seems prudent. Certain women may benefit from higher intake of Vitamin D .  If you are among these women, your doctor will have told you during your visit.    PAP SMEARS:  Pap smears, to check for cervical cancer or precancers,  have traditionally been done yearly, although recent scientific advances have shown that most women can have pap smears less often.  However, every woman still should have a physical exam from her gynecologist every year. It will include a breast check, inspection of the vulva and vagina to check for abnormal growths or skin changes, a visual exam of the cervix, and then an exam to evaluate the size and shape of the uterus and  ovaries.  And after 62 years of age, a rectal exam is indicated to check for rectal cancers. We will also provide age appropriate advice regarding health maintenance, like when you should have certain vaccines, screening for sexually transmitted diseases, bone density testing, colonoscopy, mammograms, etc.   MAMMOGRAMS:  All women over 13 years old should have a yearly mammogram. Many facilities now offer a 3D mammogram, which may cost around $50 extra out of pocket. If possible,  we recommend you accept the option to have the 3D mammogram performed.  It both reduces the number of women who will be called back for extra views which then turn out to be normal, and it is better than the routine mammogram at detecting truly abnormal areas.    COLONOSCOPY:  Colonoscopy to screen for colon cancer is recommended for all women at age 8.  We know, you hate the idea of the prep.  We agree, BUT, having colon cancer and not knowing it is worse!!  Colon cancer so often starts as a polyp that can be seen and removed at colonscopy, which can quite literally save your life!  And if your first colonoscopy is normal and you have no family history of colon cancer, most women don't have to have it again for 10 years.  Once every ten years, you can do something that may end up saving your life, right?  We will be happy to help you get it scheduled when you are ready.  Be sure to check your insurance coverage so you understand how much it will cost.  It may be covered as a preventative service at no cost, but you should check your particular policy.     Atrophic Vaginitis  Atrophic vaginitis is a condition in which the tissues that line the vagina become dry and thin. This condition is most common in women who have stopped having regular menstrual periods (are in menopause). This usually starts when a woman is 54 to 62 years old. That is the time when a woman's estrogen levels begin to decrease. Estrogen is a female hormone.  It helps to keep the tissues of the vagina moist. It stimulates the vagina to produce a clear fluid that lubricates the vagina for sex. This fluid also protects the vagina from infection. Lack of estrogen can cause the lining of the vagina to get thinner and dryer. The vagina may also shrink in size. It may become less elastic. Atrophic vaginitis tends to get worse over time as a woman's estrogen level drops. What are the causes? This condition is caused by the normal drop in estrogen that happens around the time of menopause. What increases the risk? Certain conditions or situations may lower a woman's estrogen level, leading to a higher risk for atrophic vaginitis. You are more likely to develop this condition if: You are taking medicines that block estrogen. You have had your ovaries removed. You are being treated for cancer with radiation or medicines (chemotherapy). You have given birth or are breastfeeding. You are older than age 67. You smoke. What are the signs or symptoms? Symptoms of this condition include: Pain, soreness, a feeling of pressure, or bleeding during sex (dyspareunia). Vaginal burning, irritation, or itching. Pain or bleeding when a speculum is used in a vaginal exam. Having burning pain while urinating. Vaginal discharge. In some cases, there are no symptoms. How is this diagnosed? This condition is diagnosed based on your medical history and a physical exam. This will include a pelvic exam that checks the vaginal tissues. Though rare, you may also have other tests, including: A urine test. A test that checks the acid balance in your vagina (acid balance test). How is this treated? Treatment for this condition depends on how severe your symptoms are. Treatment may include: Using an over-the-counter vaginal lubricant before sex. Using a long-acting vaginal moisturizer. Using low-dose estrogen for moderate to severe symptoms that do not respond to other treatments.  Options include creams, tablets, and inserts (vaginal rings). Before you use a vaginal estrogen, tell your health care provider if you have a history of: Breast cancer. Endometrial cancer. Blood clots. If you are not sexually active and your symptoms are very mild, you may not need treatment. Follow these instructions at home: Medicines Take over-the-counter and prescription medicines only as told by your health care provider. Do not use herbal or alternative medicines unless your health care provider says that you can. Use over-the-counter creams, lubricants, or moisturizers for dryness only as told by your health care provider. General instructions If your atrophic vaginitis is caused by menopause, discuss all of your menopause symptoms and treatment options with your health care provider. Do not douche. Do not use products that can make your vagina dry. These include: Scented feminine sprays. Scented tampons. Scented soaps. Vaginal sex can help to improve blood flow and elasticity of vaginal tissue. If you choose to have sex and it hurts, try using a water-soluble lubricant or moisturizer right before having sex.  Contact a health care provider if: Your discharge looks different than normal. Your vagina has an unusual smell. You have new symptoms. Your symptoms do not improve with treatment. Your symptoms get worse. Summary Atrophic vaginitis is a condition in which the tissues that line the vagina become dry and thin. It is most common in women who have stopped having regular menstrual periods (are in menopause). Treatment options include using vaginal lubricants and low-dose vaginal estrogen. Contact a health care provider if your vagina has an unusual smell, or if your symptoms get worse or do not improve after treatment. This information is not intended to replace advice given to you by your health care provider. Make sure you discuss any questions you have with your health care  provider. Document Revised: 10/07/2019 Document Reviewed: 10/07/2019 Elsevier Patient Education  2024 Elsevier Inc.  Consider Ky Jelly, Astroglide, Replens, or vaginal vitamin E liquid or vaginal vitamin E for treating vaginal atrophy.

## 2023-05-07 LAB — CYTOLOGY - PAP
Comment: NEGATIVE
Diagnosis: NEGATIVE
High risk HPV: NEGATIVE

## 2023-05-08 ENCOUNTER — Inpatient Hospital Stay: Payer: 59 | Attending: Hematology and Oncology

## 2023-05-08 ENCOUNTER — Other Ambulatory Visit: Payer: BC Managed Care – PPO

## 2023-05-08 ENCOUNTER — Ambulatory Visit: Payer: BC Managed Care – PPO | Admitting: Hematology and Oncology

## 2023-05-08 ENCOUNTER — Inpatient Hospital Stay: Payer: BC Managed Care – PPO

## 2023-05-08 VITALS — BP 135/85 | HR 78 | Temp 98.7°F | Resp 18

## 2023-05-08 DIAGNOSIS — M8580 Other specified disorders of bone density and structure, unspecified site: Secondary | ICD-10-CM | POA: Diagnosis present

## 2023-05-08 DIAGNOSIS — C50912 Malignant neoplasm of unspecified site of left female breast: Secondary | ICD-10-CM

## 2023-05-08 DIAGNOSIS — C50412 Malignant neoplasm of upper-outer quadrant of left female breast: Secondary | ICD-10-CM

## 2023-05-08 DIAGNOSIS — Z170 Estrogen receptor positive status [ER+]: Secondary | ICD-10-CM | POA: Insufficient documentation

## 2023-05-08 DIAGNOSIS — N8500 Endometrial hyperplasia, unspecified: Secondary | ICD-10-CM

## 2023-05-08 DIAGNOSIS — Z79811 Long term (current) use of aromatase inhibitors: Secondary | ICD-10-CM | POA: Insufficient documentation

## 2023-05-08 LAB — CMP (CANCER CENTER ONLY)
ALT: 13 U/L (ref 0–44)
AST: 20 U/L (ref 15–41)
Albumin: 4.1 g/dL (ref 3.5–5.0)
Alkaline Phosphatase: 88 U/L (ref 38–126)
Anion gap: 3 — ABNORMAL LOW (ref 5–15)
BUN: 24 mg/dL — ABNORMAL HIGH (ref 8–23)
CO2: 30 mmol/L (ref 22–32)
Calcium: 9.8 mg/dL (ref 8.9–10.3)
Chloride: 101 mmol/L (ref 98–111)
Creatinine: 0.84 mg/dL (ref 0.44–1.00)
GFR, Estimated: >60 mL/min (ref >60)
Glucose, Bld: 104 mg/dL — ABNORMAL HIGH (ref 70–99)
Potassium: 4.5 mmol/L (ref 3.5–5.1)
Sodium: 134 mmol/L — ABNORMAL LOW (ref 135–145)
Total Bilirubin: 1.4 mg/dL — ABNORMAL HIGH (ref 0.0–1.2)
Total Protein: 6.6 g/dL (ref 6.5–8.1)

## 2023-05-08 LAB — CBC WITH DIFFERENTIAL (CANCER CENTER ONLY)
Abs Immature Granulocytes: 0.01 10*3/uL (ref 0.00–0.07)
Basophils Absolute: 0.1 10*3/uL (ref 0.0–0.1)
Basophils Relative: 1 %
Eosinophils Absolute: 0.1 10*3/uL (ref 0.0–0.5)
Eosinophils Relative: 1 %
HCT: 42.9 % (ref 36.0–46.0)
Hemoglobin: 15 g/dL (ref 12.0–15.0)
Immature Granulocytes: 0 %
Lymphocytes Relative: 28 %
Lymphs Abs: 1.6 10*3/uL (ref 0.7–4.0)
MCH: 30.4 pg (ref 26.0–34.0)
MCHC: 35 g/dL (ref 30.0–36.0)
MCV: 86.8 fL (ref 80.0–100.0)
Monocytes Absolute: 0.5 10*3/uL (ref 0.1–1.0)
Monocytes Relative: 8 %
Neutro Abs: 3.7 10*3/uL (ref 1.7–7.7)
Neutrophils Relative %: 62 %
Platelet Count: 198 10*3/uL (ref 150–400)
RBC: 4.94 MIL/uL (ref 3.87–5.11)
RDW: 11.6 % (ref 11.5–15.5)
WBC Count: 5.9 10*3/uL (ref 4.0–10.5)
nRBC: 0 % (ref 0.0–0.2)

## 2023-05-08 LAB — GENETIC SCREENING ORDER

## 2023-05-08 MED ORDER — DENOSUMAB 60 MG/ML ~~LOC~~ SOSY
60.0000 mg | PREFILLED_SYRINGE | Freq: Once | SUBCUTANEOUS | Status: AC
Start: 2023-05-08 — End: 2023-05-08
  Administered 2023-05-08: 60 mg via SUBCUTANEOUS
  Filled 2023-05-08: qty 1

## 2023-05-15 LAB — SIGNATERA
SIGNATERA MTM READOUT: 0 MTM/ml
SIGNATERA TEST RESULT: NEGATIVE

## 2023-05-15 NOTE — Progress Notes (Signed)
 GYNECOLOGY  VISIT   HPI: 63 y.o.   Single  Caucasian female   G0P0000 with No LMP recorded (lmp unknown). Patient is postmenopausal.   here for: U/S consult for suspected postmenopausal bleeding.   GYNECOLOGIC HISTORY: No LMP recorded (lmp unknown). Patient is postmenopausal. Contraception:  PMP Menopausal hormone therapy:  n/a Last 2 paps:  04/05/20 neg  History of abnormal Pap or positive HPV:  no Mammogram:  mastectomy        OB History     Gravida  0   Para  0   Term  0   Preterm  0   AB  0   Living  0      SAB  0   IAB  0   Ectopic  0   Multiple  0   Live Births  0              Patient Active Problem List   Diagnosis Date Noted   Seborrheic dermatitis 06/22/2021   Fibroids 02/16/2020   Routine general medical examination at a health care facility 05/18/2019   Malignant neoplasm metastatic to bone (HCC) 03/26/2019   Pes cavus 05/12/2017   Recurrent cancer of left breast (HCC) 05/06/2014   Endometrial hyperplasia 10/02/2013   Malignant neoplasm of upper-outer quadrant of left breast in female, estrogen receptor positive (HCC) 02/23/2013   Hypothyroidism 01/15/2013   Osteopenia 09/04/2011   Hypertension 03/08/2011   Allergic rhinitis 03/08/2011    Past Medical History:  Diagnosis Date   Allergic rhinitis due to pollen    Breast cancer (HCC) 2010   Breast cancer (HCC) 2016   reoccurrence in scar   Diverticulosis    History of radiation therapy 04/26/19- 05/05/19   Right ischium 5 fractions of 10 Gy each to total 50 Gy.    Hypertension    Hypothyroidism    Osteopenia    S/P radiation therapy 07/12/2014 through 08/16/2014                                                        Left chest wall 4000 cGy in 20 sessions, left chest wall boost 1000 cGy in 5 sessions     Wears glasses     Past Surgical History:  Procedure Laterality Date   BREAST LUMPECTOMY Left 06/01/2014   Procedure: WIDE LOCAL EXCISION OF RECURRENT LEFT BREAST CANCER;   Surgeon: Donnice Bury, MD;  Location: [facility];  Service: General;  Laterality: Left;   COLONOSCOPY     cyst removed     left lower jaw - at age 67 yr   DILATATION & CURRETTAGE/HYSTEROSCOPY WITH RESECTOCOPE N/A 09/16/2013   Procedure: DILATATION & CURETTAGE/HYSTEROSCOPY WITH RESECTOCOPE;  Surgeon: Marie-Lyne Lavoie, MD;  Location: [facility];  Service: Gynecology;  Laterality: N/A;  1 hr.   INGUINAL HERNIA REPAIR     right side as infant   LAPAROSCOPIC PELVIC LYMPH NODE BIOPSY     MASTECTOMY  2010   bilateral mastectomies-lt snbx-radiation post   MASTECTOMY Bilateral    OOPHORECTOMY  2016   patient denies   Right knee arthroscopy  2008   ROBOTIC ASSISTED BILATERAL SALPINGO OOPHERECTOMY Bilateral 03/13/2015   Procedure: ROBOTIC ASSISTED BILATERAL SALPINGO OOPHORECTOMY With Washings;  Surgeon: Marie-Lyne Lavoie, MD;  Location: [facility];  Service: Gynecology;  Laterality: Bilateral;  TONSILLECTOMY     WISDOM TOOTH EXTRACTION      Current Outpatient Medications  Medication Sig Dispense Refill   acyclovir  ointment (ZOVIRAX ) 5 % Apply 1 Application topically every 3 (three) hours. As needed 5 g 5   anastrozole  (ARIMIDEX ) 1 MG tablet Take 1 tablet (1 mg total) by mouth daily. 90 tablet 3   Ascorbic Acid (VITAMIN C PO) Take by mouth.     AUVI-Q  0.3 MG/0.3ML SOAJ injection auto-injector into outer thigh Injection prn anaphylaxis for 30 days     Cholecalciferol (VITAMIN D3) 2000 UNITS capsule Take 2,000 Units by mouth daily.     Ciclopirox  1 % shampoo Use topically on scalp 120 mL 0   fexofenadine (ALLEGRA) 180 MG tablet Take 180 mg by mouth daily as needed for allergies or rhinitis.     ibuprofen (ADVIL,MOTRIN) 200 MG tablet Take 200 mg by mouth every 6 (six) hours as needed for moderate pain.     levothyroxine  (SYNTHROID ) 75 MCG tablet TAKE 1 TABLET(75 MCG) BY MOUTH DAILY BEFORE BREAKFAST 90 tablet 3   losartan  (COZAAR ) 100 MG tablet TAKE 1 TABLET(100 MG) BY MOUTH DAILY 90  tablet 3   Mometasone Furoate 370 MCG IMPL Apply 1 Application topically as directed. Five nights a week--0.1% topical solution for scalp     Olopatadine HCl (PATADAY) 0.7 % SOLN Apply to eye.     Olopatadine HCl 0.6 % SOLN Place 2 sprays into both nostrils 2 (two) times daily as needed (allergies).      Omega-3 Fatty Acids (FISH OIL PO) Take by mouth.     Polyethyl Glycol-Propyl Glycol (SYSTANE OP) Place 1 drop into both eyes daily as needed (for allergies).      pyrithione zinc (SELSUN  BLUE DRY SCALP) 1 % shampoo 1 application in place of regular shampoo Externally Two times a Week     sodium chloride  (OCEAN) 0.65 % SOLN nasal spray Place 1 spray into both nostrils as needed for congestion.     Turmeric 500 MG CAPS Take 500 mg by mouth daily.      No current facility-administered medications for this visit.     ALLERGIES: Pollen extract and Adhesive [tape]  Family History  Problem Relation Age of Onset   Hypertension Mother    Cancer Mother        Lung Cancer - long history of tobacco use   Hypertension Father    Stroke Maternal Grandfather    Stroke Paternal Grandfather    Hypertension Brother    Colon cancer Neg Hx    Stomach cancer Neg Hx     Social History   Socioeconomic History   Marital status: Single    Spouse name: Not on file   Number of children: 0   Years of education: PhD   Highest education level: Not on file  Occupational History   Not on file  Tobacco Use   Smoking status: Never   Smokeless tobacco: Never  Vaping Use   Vaping status: Never Used  Substance and Sexual Activity   Alcohol use: No    Alcohol/week: 0.0 standard drinks of alcohol    Comment: rare   Drug use: No   Sexual activity: Not Currently    Birth control/protection: None    Comment: did not answer high risk questions  Other Topics Concern   Not on file  Social History Narrative   She grew up in [place]    Spent last 16 yrs in [place]    Rare caffeine use  Social Drivers of  Corporate Investment Banker Strain: Not on file  Food Insecurity: Not on file  Transportation Needs: No Transportation Needs (03/31/2019)   PRAPARE - Administrator, Civil Service (Medical): No    Lack of Transportation (Non-Medical): No  Physical Activity: Not on file  Stress: Not on file  Social Connections: Not on file  Intimate Partner Violence: Not At Risk (03/31/2019)   Humiliation, Afraid, Rape, and Kick questionnaire    Fear of Current or Ex-Partner: No    Emotionally Abused: No    Physically Abused: No    Sexually Abused: No    Review of Systems  See HPI.   PHYSICAL EXAMINATION:   BP 112/70 (BP Location: Right Arm, Patient Position: Sitting, Cuff Size: Normal)   Pulse 78   Wt 152 lb (68.9 kg)   LMP  (LMP Unknown)   SpO2 97%   BMI 24.91 kg/m     General appearance: alert, cooperative and appears stated age   Pelvic US  Uterus 6.22 x 4.09 x 2.28 cm. Fibroid:  0.65 cm, intramural.  EMS 1.50 mm.  Symmetrical, no masses, no abnormal blood flow.  Ovaries surgically absent.  No adnexal masses.  No free fluid.   ASSESSMENT:  Postmenopausal bleeding.  Atrophy.  Fibroid.  Hx left breast cancer.   PLAN:  Pelvic ultrasound report and images reviewed.  Uterine fibroid discussed. Reassurance given.  We discussed vaginal hydration with over the counter lubricants, cooking oils, and vaginal vitamin E products.  Return for any postmenopausal bleeding or spotting.  Would then do endometrial biopsy.  Questions invited and answered. FU for routine annual exam and prn.   20 min  total time was spent for this patient encounter, including preparation, face-to-face counseling with the patient, coordination of care, and documentation of the encounter.

## 2023-05-26 NOTE — Assessment & Plan Note (Signed)
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 Oncotype DX: 17, 10 to 12% distant recurrence risk over 10 years Post mastectomy radiation February 2010-May 2015: Tamoxifen stopped because of endometrial hyperplasia 04/21/2014: Recurrence: Skin biopsy adenocarcinoma ER/PR positive 06/01/2014: Wide excision with negative margins 07/12/2014-08/16/2014: Chest wall radiation 05/06/2014: PET/CT: Activity in the right ischial tuberosity: Biopsy negative Repeat biopsy: Negative 05/09/2014-02/20/2015: Goserelin 03/13/2015: BSO: Benign 04/26/2019-05/05/2019: SBRT to right ischial tuberosity lesion 08/29/2014-08/28/2021: Anastrozole stopped after 7 years   Prior treatment: Anastrozole started 08/29/2014 -08/28/2021 along with Prolia for osteoporosis (both have been discontinued)    Surveillance: Signatera blood testing for circulating cell free DNA 10/2022: Signatera positive (0.02 MTM/ml) 02/07/2023: Rutherford Nail positive (0.07 MTM/ml) 05/14/2022: Rutherford Nail: Negative   CT CAP and bone scan 03/03/2023: No evidence of soft tissue metastases.  Treated right ischial tuberosity Bone scan 03/11/2023: No focal uptake of the right ischium.  No uptake in the bones.

## 2023-05-27 ENCOUNTER — Inpatient Hospital Stay: Payer: Self-pay | Attending: Hematology and Oncology | Admitting: Hematology and Oncology

## 2023-05-27 VITALS — BP 126/73 | HR 90 | Temp 97.8°F | Resp 18 | Ht 65.5 in | Wt 153.0 lb

## 2023-05-27 DIAGNOSIS — C50412 Malignant neoplasm of upper-outer quadrant of left female breast: Secondary | ICD-10-CM | POA: Diagnosis not present

## 2023-05-27 DIAGNOSIS — Z79811 Long term (current) use of aromatase inhibitors: Secondary | ICD-10-CM | POA: Insufficient documentation

## 2023-05-27 DIAGNOSIS — Z170 Estrogen receptor positive status [ER+]: Secondary | ICD-10-CM | POA: Diagnosis not present

## 2023-05-27 NOTE — Progress Notes (Signed)
Patient Care Team: Rollene Almarie LABOR, MD as PCP - General (Internal Medicine) Ebbie Cough, MD as Consulting Physician (General Surgery) Georgia Blonder, MD as Consulting Physician (Obstetrics and Gynecology) Albertus, Gordy HERO, MD as Consulting Physician (Gastroenterology) Buck Saucer, MD as Attending Physician (Neurology) Melba Pool, DDS as Referring Physician (Dentistry) Claudene Arthea HERO, DO as Consulting Physician (Family Medicine) Robinson Pao, MD as Consulting Physician (Dermatology) Izell Domino, MD as Attending Physician (Radiation Oncology) Melba Pool, DDS as Referring Physician (Dentistry)  DIAGNOSIS:  Encounter Diagnosis  Name Primary?   Malignant neoplasm of upper-outer quadrant of left breast in female, estrogen receptor positive (HCC) Yes    SUMMARY OF ONCOLOGIC HISTORY: Oncology History   No history exists.    CHIEF COMPLIANT: Follow-up on anastrozole  therapy  HISTORY OF PRESENT ILLNESS:  History of Present Illness   Erin [name] is a 63 year old female with breast cancer who presents for follow-up on anastrozole  treatment.  She has been on anastrozole , which has resulted in her cancer markers returning to negative. She tolerates the medication well but experiences hot flashes and has gained approximately five pounds, which she attributes to the medication or possibly holiday weight.  She received a Prolia  injection in January for bone health management   ALLERGIES:  is allergic to pollen extract and adhesive [tape].  MEDICATIONS:  Current Outpatient Medications  Medication Sig Dispense Refill   acyclovir  ointment (ZOVIRAX ) 5 % Apply 1 Application topically every 3 (three) hours. As needed 5 g 5   anastrozole  (ARIMIDEX ) 1 MG tablet Take 1 tablet (1 mg total) by mouth daily. 90 tablet 3   Ascorbic Acid (VITAMIN C PO) Take by mouth.     AUVI-Q  0.3 MG/0.3ML SOAJ injection auto-injector into outer thigh Injection prn anaphylaxis for  30 days     Cholecalciferol (VITAMIN D3) 2000 UNITS capsule Take 2,000 Units by mouth daily.     Ciclopirox  1 % shampoo Use topically on scalp 120 mL 0   fexofenadine (ALLEGRA) 180 MG tablet Take 180 mg by mouth daily as needed for allergies or rhinitis.     ibuprofen (ADVIL,MOTRIN) 200 MG tablet Take 200 mg by mouth every 6 (six) hours as needed for moderate pain.     levothyroxine  (SYNTHROID ) 75 MCG tablet TAKE 1 TABLET(75 MCG) BY MOUTH DAILY BEFORE BREAKFAST 90 tablet 3   losartan  (COZAAR ) 100 MG tablet TAKE 1 TABLET(100 MG) BY MOUTH DAILY 90 tablet 3   Mometasone Furoate 370 MCG IMPL Apply 1 Application topically as directed. Five nights a week--0.1% topical solution for scalp     Olopatadine HCl (PATADAY) 0.7 % SOLN Apply to eye.     Olopatadine HCl 0.6 % SOLN Place 2 sprays into both nostrils 2 (two) times daily as needed (allergies).      Omega-3 Fatty Acids (FISH OIL PO) Take by mouth.     Polyethyl Glycol-Propyl Glycol (SYSTANE OP) Place 1 drop into both eyes daily as needed (for allergies).      pyrithione zinc (SELSUN  BLUE DRY SCALP) 1 % shampoo 1 application in place of regular shampoo Externally Two times a Week     sodium chloride  (OCEAN) 0.65 % SOLN nasal spray Place 1 spray into both nostrils as needed for congestion.     Turmeric 500 MG CAPS Take 500 mg by mouth daily.      No current facility-administered medications for this visit.    PHYSICAL EXAMINATION: ECOG PERFORMANCE STATUS: 1 - Symptomatic but completely ambulatory  Vitals:  05/27/23 1538  BP: 126/73  Pulse: 90  Resp: 18  Temp: 97.8 F (36.6 C)  SpO2: 97%   Filed Weights   05/27/23 1538  Weight: 153 lb (69.4 kg)    Physical Exam   MEASUREMENTS: WT- gained about five pounds      (exam performed in the presence of a chaperone)  LABORATORY DATA:  I have reviewed the data as listed    Latest Ref Rng & Units 05/08/2023    2:35 PM 02/27/2023    3:47 PM 07/31/2022    9:36 AM  CMP  Glucose 70 - 99  mg/dL 895  85  77   BUN 8 - 23 mg/dL 24  25  20    Creatinine 0.44 - 1.00 mg/dL 9.15  9.12  9.17   Sodium 135 - 145 mmol/L 134  134  135   Potassium 3.5 - 5.1 mmol/L 4.5  4.4  4.3   Chloride 98 - 111 mmol/L 101  101  99   CO2 22 - 32 mmol/L 30  30  30    Calcium 8.9 - 10.3 mg/dL 9.8  9.4  9.7   Total Protein 6.5 - 8.1 g/dL 6.6  6.7  6.8   Total Bilirubin 0.0 - 1.2 mg/dL 1.4  1.1  1.4   Alkaline Phos 38 - 126 U/L 88  93  91   AST 15 - 41 U/L 20  17  20    ALT 0 - 44 U/L 13  11  13      Lab Results  Component Value Date   WBC 5.9 05/08/2023   HGB 15.0 05/08/2023   HCT 42.9 05/08/2023   MCV 86.8 05/08/2023   PLT 198 05/08/2023   NEUTROABS 3.7 05/08/2023    ASSESSMENT & PLAN:  Malignant neoplasm of upper-outer quadrant of left breast in female, 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 Oncotype DX: 17, 10 to 12% distant recurrence risk over 10 years Post mastectomy radiation February 2010-May 2015: Tamoxifen  stopped because of endometrial hyperplasia 04/21/2014: Recurrence: Skin biopsy adenocarcinoma ER/PR positive 06/01/2014: Wide excision with negative margins 07/12/2014-08/16/2014: Chest wall radiation 05/06/2014: PET/CT: Activity in the right ischial tuberosity: Biopsy negative Repeat biopsy: Negative 05/09/2014-02/20/2015: Goserelin 03/13/2015: BSO: Benign 04/26/2019-05/05/2019: SBRT to right ischial tuberosity lesion 08/29/2014-08/28/2021: Anastrozole  stopped after 7 years   Prior treatment: Anastrozole  started 08/29/2014 -08/28/2021 along with Prolia  for osteoporosis (both have been discontinued)  Current treatment: Restarted anastrozole  October 2024   Surveillance: Signatera blood testing for circulating cell free DNA 10/2022: Signatera positive (0.02 MTM/ml) 02/07/2023: Burney positive (0.07 MTM/ml) 05/14/2022: Burney: Negative   CT CAP and bone scan 03/03/2023: No evidence of soft tissue  metastases.  Treated right ischial tuberosity Bone scan 03/11/2023: No focal uptake of the right ischium.  No uptake in the bones. Patient will continue Prolia  injections every 6 months. Signatera blood draws have been scheduled. As long as the Signatera test is negative we do not need to repeat scans.  I will see her back in July for follow-up along with her Prolia .  No orders of the defined types were placed in this encounter.  The patient has a good understanding of the overall plan. she agrees with it. she will call with any problems that may develop before the next visit here. Total time spent: 30 mins including face to face time and time spent for planning, charting and co-ordination of care   Viinay K Zarian Colpitts, MD  05/27/23    

## 2023-05-29 ENCOUNTER — Ambulatory Visit: Payer: 59 | Admitting: Obstetrics and Gynecology

## 2023-05-29 ENCOUNTER — Ambulatory Visit: Payer: 59

## 2023-05-29 ENCOUNTER — Telehealth: Payer: Self-pay

## 2023-05-29 VITALS — BP 112/70 | HR 78 | Wt 152.0 lb

## 2023-05-29 DIAGNOSIS — D251 Intramural leiomyoma of uterus: Secondary | ICD-10-CM | POA: Diagnosis not present

## 2023-05-29 DIAGNOSIS — N950 Postmenopausal bleeding: Secondary | ICD-10-CM

## 2023-05-29 NOTE — Telephone Encounter (Signed)
 Pt LVM in triage line inquiring if she needed to drink 32oz of water prior to her PUS today.  Spoke w/ the pt and advised that if she can hydrate before coming in and not use th restroom prior to her appt, that would be good in case external pictures are taken.   However, if unable, will be fine.   Pt voiced understanding. Encounter closed.

## 2023-05-29 NOTE — Patient Instructions (Signed)
 Fibroids in the Uterus: What to Know  Fibroids are benign tumors that grow in the uterus. Benign means that they are not cancer. You may have one or more than one fibroid. Fibroids vary in size, weight, and where they grow in the uterus. Some can become quite large. Most fibroids do not need to be treated by a health care provider. What are the causes? The cause of this condition is not known. What increases the risk? You are more likely to develop this condition if: You're in your 30s or 40s and you've not gone through menopause. Menopause is a time when a person no longer has a period. You have family members who have had fibroids. You're of African American descent. You started your monthly period at age 89 or younger. You've never given birth. You're overweight or obese. You have a diet low in fruits, vegetables, and vitamin D . What are the signs or symptoms? Many people do not have symptoms. If you have symptoms, they may include: Heavy bleeding during a period. Bleeding between periods. Pain and pressure in the area between the hip bones (pelvis). Pain during sex. Bladder problems, such as needing to pee right away or more often than usual. Inability to get pregnant. Not being able to carry a pregnancy to term (miscarriage). How is this diagnosed? This condition may be diagnosed based on: Your symptoms and medical history. A physical exam, including feeling for any tumors. Imaging tests, such as an ultrasound or MRI. How is this treated? This condition may be treated with: Medicines. You may be given medicines to treat pain. You may also be given hormones. Hormones are given in the form of a pill or a shot. They may also be given as an IUD. Surgery. This may be done to: Take out the fibroids. This is done if the fibroids are making it hard for you to get pregnant. Take out the uterus. Block the blood flow to the fibroids. This can shrink the fibroids and make them go  away. Other procedures to shrink the fibroids. Your provider may do: An RFA (radiofrequency ablation). This uses heat and radio energy to shrink the fibroids. An MRI-guided ultrasound. This uses ultrasound waves to shrink the fibroids. This is done through the skin. No cuts are made on the skin. Follow these instructions at home: Medicines Take your medicines only as told. Ask your provider if you should take iron pills or eat more iron-rich foods, such as dark green, leafy vegetables. Heavy bleeding from your period can cause low iron levels in the body. Managing pain  Put heat on your back or belly as told. Use the heat source that your provider recommends, such as a moist heat pack or a heating pad. Do this as often as told. Place a towel between your skin and the heat source. Leave the heat on for 20-30 minutes. If your skin turns red, take off the heat right away to prevent burns. The risk of burns is higher if you can't feel pain, heat, or cold. General instructions Pay close attention to your monthly cycle. Tell your provider about any changes, such as: Heavier bleeding that requires you to change your pads or tampons more than usual. A change in the number of days that your period lasts. A change in symptoms that come with your period, such as back pain or cramps in your belly. Keep all follow-up visits. As part of your treatment, your provider needs to closely monitor your fibroids for any  changes. Contact a health care provider if: You have pain in your pelvis or back. Or cramps in your belly that do not get better with medicine or heat. You get new bleeding between your periods. You have worse bleeding during your period, or between them. You feel weaker or more tired than usual. You feel light-headed. Get help right away if: You faint. You have pain in the pelvis that suddenly gets worse. You have very bad bleeding from the vagina that soaks a tampon or pad in 30 minutes or  less. This information is not intended to replace advice given to you by your health care provider. Make sure you discuss any questions you have with your health care provider. Document Revised: 10/08/2022 Document Reviewed: 10/08/2022 Elsevier Patient Education  2024 [employer]  Vaginal Dryness and Thinning (Atrophic Vaginitis): What to Know  Atrophic vaginitis is when the lining of the vagina becomes dry, thin, and inflamed. Tell your doctor if you notice any changes in your vagina. They can help you find ways to feel better. They can also treat infections and suggest healthy habits for a healthy vagina. What are the causes? This condition is caused by a drop in estrogen. This affects the moisture in the lining of your vagina. When estrogen levels are low, the tissues become dry. This is most common when menstrual periods stop during menopause. What increases the risk? You're more likely to get this condition if: You take medicines that block estrogen. You've had your ovaries taken out. You're being treated for cancer. You recently had a baby. You're breastfeeding. You're over 37 years old. You have an eating disorder. You smoke. What are the signs or symptoms? Pain during sex. Soreness during sex. Bleeding during sex. Burning or itching around your vagina. Loss of interest in sex. Burning pain when you pee. Peeing often. Fluid coming from your vagina that isn't normal. It may be: White. [place]. Yellow. Tinted with blood. Thick. Watery. Some people don't have symptoms. How is this treated? Using a lubricant before sex. Using a moisturizer in the vagina. Using estrogen in the vagina. You may not need treatment if your symptoms are mild or you're not having sex. Follow these instructions at home: Medicines Take your medicines only as told. Do not use herbal or other medicines unless your doctor says it's safe. Use creams, lubricants, or moisturizers only as  told. General instructions Talk with your doctor about any menopause symptoms and treatment options. Do not douche. Do not use scented: Sprays. Tampons. Soaps. If sex hurts, try using lubricants right before you have sex. Contact a doctor if: You have fluid coming from the vagina that's not normal. You have a smell coming from your vagina. You have new symptoms. Your symptoms don't get better with treatment. Your symptoms get worse. This information is not intended to replace advice given to you by your health care provider. Make sure you discuss any questions you have with your health care provider. Document Revised: 11/18/2022 Document Reviewed: 11/18/2022 Elsevier Patient Education  2024 [employer].

## 2023-05-30 ENCOUNTER — Encounter: Payer: Self-pay | Admitting: Obstetrics and Gynecology

## 2023-07-04 ENCOUNTER — Encounter: Payer: BC Managed Care – PPO | Admitting: Internal Medicine

## 2023-07-17 ENCOUNTER — Encounter: Payer: Self-pay | Admitting: Internal Medicine

## 2023-07-17 DIAGNOSIS — M8580 Other specified disorders of bone density and structure, unspecified site: Secondary | ICD-10-CM

## 2023-07-17 DIAGNOSIS — E038 Other specified hypothyroidism: Secondary | ICD-10-CM

## 2023-07-17 DIAGNOSIS — I10 Essential (primary) hypertension: Secondary | ICD-10-CM

## 2023-07-24 ENCOUNTER — Other Ambulatory Visit (INDEPENDENT_AMBULATORY_CARE_PROVIDER_SITE_OTHER)

## 2023-07-24 DIAGNOSIS — M8580 Other specified disorders of bone density and structure, unspecified site: Secondary | ICD-10-CM

## 2023-07-24 DIAGNOSIS — I10 Essential (primary) hypertension: Secondary | ICD-10-CM

## 2023-07-24 DIAGNOSIS — E038 Other specified hypothyroidism: Secondary | ICD-10-CM | POA: Diagnosis not present

## 2023-07-24 LAB — LIPID PANEL
Cholesterol: 135 mg/dL (ref 0–200)
HDL: 59.4 mg/dL (ref >39.00)
LDL Cholesterol: 67 mg/dL (ref 0–99)
NonHDL: 75.83
Total CHOL/HDL Ratio: 2
Triglycerides: 43 mg/dL (ref 0.0–149.0)
VLDL: 8.6 mg/dL (ref 0.0–40.0)

## 2023-07-24 LAB — CBC
HCT: 44.3 % (ref 36.0–46.0)
Hemoglobin: 15 g/dL (ref 12.0–15.0)
MCHC: 33.9 g/dL (ref 30.0–36.0)
MCV: 89.1 fl (ref 78.0–100.0)
Platelets: 208 10*3/uL (ref 150.0–400.0)
RBC: 4.97 Mil/uL (ref 3.87–5.11)
RDW: 12.3 % (ref 11.5–15.5)
WBC: 3.6 10*3/uL — ABNORMAL LOW (ref 4.0–10.5)

## 2023-07-24 LAB — COMPREHENSIVE METABOLIC PANEL WITH GFR
ALT: 12 U/L (ref 0–35)
AST: 18 U/L (ref 0–37)
Albumin: 4.2 g/dL (ref 3.5–5.2)
Alkaline Phosphatase: 57 U/L (ref 39–117)
BUN: 18 mg/dL (ref 6–23)
CO2: 30 meq/L (ref 19–32)
Calcium: 8.9 mg/dL (ref 8.4–10.5)
Chloride: 102 meq/L (ref 96–112)
Creatinine, Ser: 0.8 mg/dL (ref 0.40–1.20)
GFR: 79.38 mL/min (ref >60.00)
Glucose, Bld: 86 mg/dL (ref 70–99)
Potassium: 4.3 meq/L (ref 3.5–5.1)
Sodium: 136 meq/L (ref 135–145)
Total Bilirubin: 1.4 mg/dL — ABNORMAL HIGH (ref 0.2–1.2)
Total Protein: 6.7 g/dL (ref 6.0–8.3)

## 2023-07-24 LAB — VITAMIN D 25 HYDROXY (VIT D DEFICIENCY, FRACTURES): VITD: 46.22 ng/mL (ref 30.00–100.00)

## 2023-07-24 LAB — TSH: TSH: 1.86 u[IU]/mL (ref 0.35–5.50)

## 2023-07-24 LAB — T4, FREE: Free T4: 1.2 ng/dL (ref 0.60–1.60)

## 2023-07-25 ENCOUNTER — Encounter: Payer: BC Managed Care – PPO | Admitting: Internal Medicine

## 2023-07-28 ENCOUNTER — Encounter: Payer: Self-pay | Admitting: Internal Medicine

## 2023-07-28 ENCOUNTER — Ambulatory Visit (INDEPENDENT_AMBULATORY_CARE_PROVIDER_SITE_OTHER): Payer: BC Managed Care – PPO | Admitting: Internal Medicine

## 2023-07-28 VITALS — BP 120/80 | HR 79 | Temp 97.9°F | Ht 65.5 in | Wt 155.0 lb

## 2023-07-28 DIAGNOSIS — Z0000 Encounter for general adult medical examination without abnormal findings: Secondary | ICD-10-CM

## 2023-07-28 DIAGNOSIS — M8580 Other specified disorders of bone density and structure, unspecified site: Secondary | ICD-10-CM

## 2023-07-28 DIAGNOSIS — E038 Other specified hypothyroidism: Secondary | ICD-10-CM

## 2023-07-28 DIAGNOSIS — I10 Essential (primary) hypertension: Secondary | ICD-10-CM

## 2023-07-28 MED ORDER — LEVOTHYROXINE SODIUM 75 MCG PO TABS: ORAL_TABLET | ORAL | 3 refills | Status: AC

## 2023-07-28 MED ORDER — LOSARTAN POTASSIUM 100 MG PO TABS
ORAL_TABLET | ORAL | 3 refills | Status: AC
Start: 2023-07-28 — End: ?

## 2023-07-28 NOTE — Progress Notes (Unsigned)
   Subjective:   Patient ID: Erin [name], female    DOB: October 16, 1960, 63 y.o.   MRN: [id number]  HPI The patient is here for physical.  PMH, [facility], social history reviewed and updated  Review of Systems  Constitutional: Negative.   HENT: Negative.    Eyes: Negative.   Respiratory:  Negative for cough, chest tightness and shortness of breath.   Cardiovascular:  Negative for chest pain, palpitations and leg swelling.  Gastrointestinal:  Negative for abdominal distention, abdominal pain, constipation, diarrhea, nausea and vomiting.  Musculoskeletal: Negative.   Skin: Negative.   Neurological: Negative.   Psychiatric/Behavioral: Negative.      Objective:  Physical Exam Constitutional:      Appearance: She is well-developed.  HENT:     Head: Normocephalic and atraumatic.  Cardiovascular:     Rate and Rhythm: Normal rate and regular rhythm.  Pulmonary:     Effort: Pulmonary effort is normal. No respiratory distress.     Breath sounds: Normal breath sounds. No wheezing or rales.  Abdominal:     General: Bowel sounds are normal. There is no distension.     Palpations: Abdomen is soft.     Tenderness: There is no abdominal tenderness. There is no rebound.  Musculoskeletal:     Cervical back: Normal range of motion.  Skin:    General: Skin is warm and dry.  Neurological:     Mental Status: She is alert and oriented to person, place, and time.     Coordination: Coordination normal.   Vitals:   07/28/23 0933  BP: 120/80  Pulse: 79  Temp: 97.9 F (36.6 C)  TempSrc: Oral  SpO2: 99%  Weight: 155 lb (70.3 kg)  Height: 5' 5.5" (1.664 m)    Assessment & Plan:

## 2023-07-31 NOTE — Assessment & Plan Note (Signed)
 Due DEXA later this year.

## 2023-07-31 NOTE — Assessment & Plan Note (Signed)
 BP at goal continue losartan 100 mg daily. Recent labs at goal.

## 2023-07-31 NOTE — Assessment & Plan Note (Signed)
Flu shot yearly. Pneumonia counseled. Shingrix complete. Tetanus up to date. Colonoscopy up to date. Mammogram up to date, pap smear up to date and dexa up to date. Counseled about sun safety and mole surveillance. Counseled about the dangers of distracted driving. Given 10 year screening recommendations.

## 2023-07-31 NOTE — Assessment & Plan Note (Signed)
 Reviewed TSH and free T4 levels and continue synthroid 75 mcg daily.

## 2023-08-07 ENCOUNTER — Inpatient Hospital Stay: Payer: BC Managed Care – PPO | Attending: Hematology and Oncology

## 2023-08-07 DIAGNOSIS — Z170 Estrogen receptor positive status [ER+]: Secondary | ICD-10-CM

## 2023-08-07 LAB — GENETIC SCREENING ORDER

## 2023-08-16 LAB — SIGNATERA
SIGNATERA MTM READOUT: 0.33 MTM/ml — AB
SIGNATERA TEST RESULT: POSITIVE — AB

## 2023-08-18 ENCOUNTER — Telehealth: Payer: Self-pay

## 2023-08-18 DIAGNOSIS — C7951 Secondary malignant neoplasm of bone: Secondary | ICD-10-CM

## 2023-08-18 DIAGNOSIS — C50412 Malignant neoplasm of upper-outer quadrant of left female breast: Secondary | ICD-10-CM

## 2023-08-18 NOTE — Telephone Encounter (Signed)
 Pt's signatera results came back with an increase of 0.33 MTM/mL from previous 0.07 from oct 2024. MD recommendation is to have CT CAP and bone scan and MD f/u one week later. Attempted to call pt to further discuss and schedule appts. Historically, her schedule is complicated so she will need to approve of all dates and times scheduled. LVM for call back to discuss.

## 2023-08-19 ENCOUNTER — Telehealth: Payer: Self-pay | Admitting: *Deleted

## 2023-08-19 NOTE — Telephone Encounter (Signed)
 Called pt to f/u on scheduling of scans and Signatera results. Pt has been scheduled for scans. Will send scheduling a message for MD visit 1 week after scan appts on 5/12

## 2023-08-19 NOTE — Telephone Encounter (Signed)
 Called pt to go over signatera results and recommendations from Dr. Lee Public. Left vm to call office to go over everything. Awaiting callback from pt.

## 2023-09-01 ENCOUNTER — Ambulatory Visit (HOSPITAL_COMMUNITY)
Admission: RE | Admit: 2023-09-01 | Discharge: 2023-09-01 | Disposition: A | Discharge status: Home / Self Care | Source: Ambulatory Visit | Attending: Hematology and Oncology | Admitting: Hematology and Oncology

## 2023-09-01 ENCOUNTER — Encounter (HOSPITAL_COMMUNITY)
Admission: RE | Admit: 2023-09-01 | Discharge: 2023-09-01 | Disposition: A | Discharge status: Home / Self Care | Source: Ambulatory Visit | Attending: Hematology and Oncology | Admitting: Hematology and Oncology

## 2023-09-01 DIAGNOSIS — C50412 Malignant neoplasm of upper-outer quadrant of left female breast: Secondary | ICD-10-CM | POA: Diagnosis present

## 2023-09-01 DIAGNOSIS — C7951 Secondary malignant neoplasm of bone: Secondary | ICD-10-CM | POA: Diagnosis present

## 2023-09-01 DIAGNOSIS — Z170 Estrogen receptor positive status [ER+]: Secondary | ICD-10-CM | POA: Insufficient documentation

## 2023-09-01 MED ORDER — SODIUM CHLORIDE (PF) 0.9 % IJ SOLN
INTRAMUSCULAR | Status: AC
  Filled 2023-09-01: qty 50

## 2023-09-01 MED ORDER — IOHEXOL 300 MG/ML  SOLN
100.0000 mL | Freq: Once | INTRAMUSCULAR | Status: AC | PRN
  Administered 2023-09-01: 100 mL via INTRAVENOUS

## 2023-09-01 MED ORDER — TECHNETIUM TC 99M MEDRONATE IV KIT
20.0000 | PACK | Freq: Once | INTRAVENOUS | Status: AC | PRN
  Administered 2023-09-01: 19.8 via INTRAVENOUS

## 2023-09-08 ENCOUNTER — Ambulatory Visit: Payer: BC Managed Care – PPO | Admitting: Hematology and Oncology

## 2023-09-11 ENCOUNTER — Ambulatory Visit: Admitting: Hematology and Oncology

## 2023-09-16 ENCOUNTER — Inpatient Hospital Stay: Attending: Hematology and Oncology

## 2023-09-16 ENCOUNTER — Inpatient Hospital Stay: Attending: Hematology and Oncology | Admitting: Hematology and Oncology

## 2023-09-16 ENCOUNTER — Other Ambulatory Visit: Payer: Self-pay | Admitting: *Deleted

## 2023-09-16 VITALS — BP 115/78 | HR 89 | Temp 98.2°F | Resp 18 | Ht 65.5 in | Wt 155.2 lb

## 2023-09-16 DIAGNOSIS — Z923 Personal history of irradiation: Secondary | ICD-10-CM | POA: Insufficient documentation

## 2023-09-16 DIAGNOSIS — M810 Age-related osteoporosis without current pathological fracture: Secondary | ICD-10-CM | POA: Diagnosis not present

## 2023-09-16 DIAGNOSIS — R978 Other abnormal tumor markers: Secondary | ICD-10-CM | POA: Diagnosis not present

## 2023-09-16 DIAGNOSIS — Z853 Personal history of malignant neoplasm of breast: Secondary | ICD-10-CM | POA: Diagnosis present

## 2023-09-16 DIAGNOSIS — Z79811 Long term (current) use of aromatase inhibitors: Secondary | ICD-10-CM | POA: Insufficient documentation

## 2023-09-16 DIAGNOSIS — Z170 Estrogen receptor positive status [ER+]: Secondary | ICD-10-CM

## 2023-09-16 DIAGNOSIS — C50412 Malignant neoplasm of upper-outer quadrant of left female breast: Secondary | ICD-10-CM

## 2023-09-16 DIAGNOSIS — C7951 Secondary malignant neoplasm of bone: Secondary | ICD-10-CM | POA: Diagnosis present

## 2023-09-16 DIAGNOSIS — R911 Solitary pulmonary nodule: Secondary | ICD-10-CM | POA: Diagnosis not present

## 2023-09-16 NOTE — Assessment & Plan Note (Signed)
 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 Oncotype DX: 17, 10 to 12% distant recurrence risk over 10 years Post mastectomy radiation February 2010-May 2015: Tamoxifen  stopped because of endometrial hyperplasia 04/21/2014: Recurrence: Skin biopsy adenocarcinoma ER/PR positive 06/01/2014: Wide excision with negative margins 07/12/2014-08/16/2014: Chest wall radiation 05/06/2014: PET/CT: Activity in the right ischial tuberosity: Biopsy negative Repeat biopsy: Negative 05/09/2014-02/20/2015: Goserelin 03/13/2015: BSO: Benign 04/26/2019-05/05/2019: SBRT to right ischial tuberosity lesion 08/29/2014-08/28/2021: Anastrozole  stopped after 7 years   Prior treatment: Anastrozole  started 08/29/2014 -08/28/2021 along with Prolia  for osteoporosis (both have been discontinued)  Current treatment: Restarted anastrozole  October 2024    Surveillance: Signatera blood testing for circulating cell free DNA 10/2022: Signatera positive (0.02 MTM/ml) 02/07/2023: Della Fent positive (0.07 MTM/ml) 05/14/2022: Della Fent: Negative 08/07/2023: Signatera: +0.33   CT CAP and bone scan 09/01/2023: Similar appearance of the treated bone metastases right ischial tuberosity with no convincing evidence of active malignancy within CAP, bone scan: Stable mild uptake left first rib.  I discussed with her about stopping Signatera testing versus continued monitoring. We do not intend to do scans more than once in 6 months.  Patient will continue Prolia  injections every 6 months.  Return to clinic in 1 year for follow-up

## 2023-09-16 NOTE — Progress Notes (Signed)
 Signatera renewal orders placed.

## 2023-09-16 NOTE — Progress Notes (Signed)
 Patient Care Team: Adelia Homestead, MD as PCP - General (Internal Medicine) Enid Harry, MD as Consulting Physician (General Surgery) Percy Bracken, MD as Consulting Physician (Obstetrics and Gynecology) Bridgett Camps, Amber Bail, MD as Consulting Physician (Gastroenterology) Debbra Fairy, MD as Attending Physician (Neurology) Adelaide Holy, DDS as Referring Physician (Dentistry) Isidro Margo, DO as Consulting Physician (Family Medicine) Thais Fill, MD as Consulting Physician (Dermatology) Colie Dawes, MD as Attending Physician (Radiation Oncology) Adelaide Holy, DDS as Referring Physician (Dentistry) Cameron Cea, MD as Consulting Physician (Hematology and Oncology)  DIAGNOSIS:  Encounter Diagnosis  Name Primary?   Malignant neoplasm of upper-outer quadrant of left breast in female, estrogen receptor positive (HCC) Yes   CHIEF COMPLIANT: F/U to review results of scans  HISTORY OF PRESENT ILLNESS:   History of Present Illness Erin [name] "Erin [name]" is a 63 year old female with breast cancer who presents for evaluation of stable bone scan findings and elevated tumor markers.  Her recent bone scan shows stable findings with mild focal uptake in the posterior left first rib, unchanged from previous scans. There is no recent trauma or severe coughing to explain this finding. She experiences occasional soreness from carrying a backpack on the left side, which is not concerning to her.  She is on anastrozole  and experiences fluctuating tumor marker levels, which cause stress. She recalls a previous ischial tuberosity lesion that was monitored and treated with radiation.  A CT scan reveals a 2 mm right lower lung nodule and some lung scar tissue, consistent with her history of prior radiation. Abdominal organs show no significant findings, and the ischial tuberosity lesion remains stable.     ALLERGIES:  is allergic to pollen extract and adhesive  [tape].  MEDICATIONS:  Current Outpatient Medications  Medication Sig Dispense Refill   anastrozole  (ARIMIDEX ) 1 MG tablet Take 1 tablet (1 mg total) by mouth daily. 90 tablet 3   Ascorbic Acid (VITAMIN C PO) Take by mouth.     AUVI-Q  0.3 MG/0.3ML SOAJ injection auto-injector into outer thigh Injection prn anaphylaxis for 30 days     Cholecalciferol (VITAMIN D3) 2000 UNITS capsule Take 2,000 Units by mouth daily.     Ciclopirox  1 % shampoo Use topically on scalp 120 mL 0   denosumab  (PROLIA ) 60 MG/ML SOSY injection Inject 60 mg into the skin every 6 (six) months.     fexofenadine (ALLEGRA) 180 MG tablet Take 180 mg by mouth daily as needed for allergies or rhinitis.     levothyroxine  (SYNTHROID ) 75 MCG tablet TAKE 1 TABLET(75 MCG) BY MOUTH DAILY BEFORE BREAKFAST 90 tablet 3   losartan  (COZAAR ) 100 MG tablet TAKE 1 TABLET(100 MG) BY MOUTH DAILY 90 tablet 3   Mometasone Furoate 370 MCG IMPL Apply 1 Application topically as directed. Five nights a week--0.1% topical solution for scalp     Olopatadine HCl (PATADAY) 0.7 % SOLN Apply to eye.     Omega-3 Fatty Acids (FISH OIL PO) Take by mouth.     pyrithione zinc (SELSUN  BLUE DRY SCALP) 1 % shampoo 1 application in place of regular shampoo Externally Two times a Week     sodium chloride  (OCEAN) 0.65 % SOLN nasal spray Place 1 spray into both nostrils as needed for congestion.     Turmeric 500 MG CAPS Take 500 mg by mouth daily.      acyclovir  ointment (ZOVIRAX ) 5 % Apply 1 Application topically every 3 (three) hours. As needed (Patient not taking: Reported on 09/16/2023) 5 g 5  ibuprofen (ADVIL,MOTRIN) 200 MG tablet Take 200 mg by mouth every 6 (six) hours as needed for moderate pain. (Patient not taking: Reported on 09/16/2023)     Olopatadine HCl 0.6 % SOLN Place 2 sprays into both nostrils 2 (two) times daily as needed (allergies).  (Patient not taking: Reported on 09/16/2023)     Polyethyl Glycol-Propyl Glycol (SYSTANE OP) Place 1 drop into both  eyes daily as needed (for allergies).  (Patient not taking: Reported on 09/16/2023)     No current facility-administered medications for this visit.    PHYSICAL EXAMINATION: ECOG PERFORMANCE STATUS: 1 - Symptomatic but completely ambulatory  Vitals:   09/16/23 0837  BP: 115/78  Pulse: 89  Resp: 18  Temp: 98.2 F (36.8 C)  SpO2: 99%   Filed Weights   09/16/23 0837  Weight: 155 lb 3.2 oz (70.4 kg)      LABORATORY DATA:  I have reviewed the data as listed    Latest Ref Rng & Units 07/24/2023    9:55 AM 05/08/2023    2:35 PM 02/27/2023    3:47 PM  CMP  Glucose 70 - 99 mg/dL 86  161  85   BUN 6 - 23 mg/dL 18  24  25    Creatinine 0.40 - 1.20 mg/dL 0.96  0.45  4.09   Sodium 135 - 145 mEq/L 136  134  134   Potassium 3.5 - 5.1 mEq/L 4.3  4.5  4.4   Chloride 96 - 112 mEq/L 102  101  101   CO2 19 - 32 mEq/L 30  30  30    Calcium 8.4 - 10.5 mg/dL 8.9  9.8  9.4   Total Protein 6.0 - 8.3 g/dL 6.7  6.6  6.7   Total Bilirubin 0.2 - 1.2 mg/dL 1.4  1.4  1.1   Alkaline Phos 39 - 117 U/L 57  88  93   AST 0 - 37 U/L 18  20  17    ALT 0 - 35 U/L 12  13  11      Lab Results  Component Value Date   WBC 3.6 (L) 07/24/2023   HGB 15.0 07/24/2023   HCT 44.3 07/24/2023   MCV 89.1 07/24/2023   PLT 208.0 07/24/2023   NEUTROABS 3.7 05/08/2023    ASSESSMENT & PLAN:  Malignant neoplasm of upper-outer quadrant of left breast in female, 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 Oncotype DX: 17, 10 to 12% distant recurrence risk over 10 years Post mastectomy radiation February 2010-May 2015: Tamoxifen  stopped because of endometrial hyperplasia 04/21/2014: Recurrence: Skin biopsy adenocarcinoma ER/PR positive 06/01/2014: Wide excision with negative margins 07/12/2014-08/16/2014: Chest wall radiation 05/06/2014: PET/CT: Activity in the right ischial tuberosity: Biopsy negative Repeat biopsy:  Negative 05/09/2014-02/20/2015: Goserelin 03/13/2015: BSO: Benign 04/26/2019-05/05/2019: SBRT to right ischial tuberosity lesion 08/29/2014-08/28/2021: Anastrozole  stopped after 7 years   Prior treatment: Anastrozole  started 08/29/2014 -08/28/2021 along with Prolia  for osteoporosis (both have been discontinued)  Current treatment: Restarted anastrozole  October 2024    Surveillance: Signatera blood testing for circulating cell free DNA 10/2022: Signatera positive (0.02 MTM/ml) 02/07/2023: Della Fent positive (0.07 MTM/ml) 05/14/2022: Della Fent: Negative 08/07/2023: Signatera: +0.33   CT CAP and bone scan 09/01/2023: Similar appearance of the treated bone metastases right ischial tuberosity with no convincing evidence of active malignancy within CAP, bone scan: Stable mild uptake left first rib.   Treatment plan: Chest MRI to evaluate the rib lesion better Guardant360 to determine  if there are any mutations on the cancer cells  If the chest MRI shows that this rib lesion is truly malignant, then we might consider SRS to the rib lesion. If the Guardant360 reveals that she has an ESR 1 mutation and we will discuss switching her to Faslodex versus Elacestrant. Assessment & Plan Malignant neoplasm of upper-outer quadrant of left breast Estrogen receptor-positive breast cancer with stable rib lesion and managed ischial tuberosity lesion. Tumor markers fluctuate; Signatera increase suggests activity. Discussed potential treatment changes based on Gardant 360 results. - Order MRI of the chest to evaluate the rib lesion. - Perform Gardant 360 blood test to assess for mutations in circulating tumor DNA. - Continue anastrozole  1 mg orally daily. - Consider stereotactic radiosurgery [facility]) for the rib lesion if MRI suggests malignancy. - Consider switching to Faslodex or elacestrant if Gardant 360 shows ESR1 mutation. - Continue current treatment and monitor with Signatera if no ESR1 mutation and rib lesion is not  concerning.  Suspicious rib lesion, possible metastasis Mild focal uptake in the posterior left first rib, unchanged. Differential includes trauma or metastasis. MRI preferred for differentiation. - Order MRI of the chest to evaluate the rib lesion for bone destruction or trauma. - Consider stereotactic radiosurgery [facility]) if MRI is suspicious for cancer.    Ischial tuberosity lesion Well-managed with no new activity. Previous biopsies negative for malignancy. - Monitor with routine imaging as needed.  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 No acute issues.  Goals of Care Discussed long-term management and prognosis. Reassured condition is not immediately life-threatening. - Continue current management and reassess as new information becomes available.  Follow-up Plan to review results of upcoming tests and adjust treatment as necessary. - Schedule follow-up appointment two days after MRI to discuss results and next steps.      Orders Placed This Encounter  Procedures   MR Chest W Contrast    Standing Status:   Future    Expected Date:   09/23/2023    Expiration Date:   09/15/2024    Reason for Exam (SYMPTOM  OR DIAGNOSIS REQUIRED):   rib lesion    If indicated for the ordered procedure, I authorize the administration of contrast media per Radiology protocol:   Yes    What is the patient's sedation requirement?:   No Sedation    Does the patient have a pacemaker or implanted devices?:   No    Preferred imaging location?:   [facility] (table limit - 550 lbs)    Release to patient:   Immediate   Guardant 360    Standing Status:   Future    Number of Occurrences:   1    Expiration Date:   09/15/2024   The patient has a good understanding of the overall plan. she agrees with it. she will call with any problems that may develop before the next visit here. Total time spent: 45 mins including face to face time and time spent for planning,  charting and co-ordination of care   Viinay K Jordanny Waddington, MD 09/16/23

## 2023-09-22 ENCOUNTER — Ambulatory Visit (HOSPITAL_COMMUNITY)
Admission: RE | Admit: 2023-09-22 | Discharge: 2023-09-22 | Disposition: A | Discharge status: Home / Self Care | Source: Ambulatory Visit | Attending: Hematology and Oncology | Admitting: Hematology and Oncology

## 2023-09-22 DIAGNOSIS — Z170 Estrogen receptor positive status [ER+]: Secondary | ICD-10-CM | POA: Insufficient documentation

## 2023-09-22 DIAGNOSIS — C50412 Malignant neoplasm of upper-outer quadrant of left female breast: Secondary | ICD-10-CM | POA: Diagnosis present

## 2023-09-22 MED ORDER — GADOBUTROL 1 MMOL/ML IV SOLN
7.0000 mL | Freq: Once | INTRAVENOUS | Status: AC | PRN
  Administered 2023-09-22: 7 mL via INTRAVENOUS

## 2023-09-24 ENCOUNTER — Ambulatory Visit: Admitting: Hematology and Oncology

## 2023-10-01 ENCOUNTER — Inpatient Hospital Stay: Attending: Hematology and Oncology | Admitting: Hematology and Oncology

## 2023-10-01 VITALS — BP 131/72 | HR 83 | Temp 97.8°F | Resp 16 | Wt 156.5 lb

## 2023-10-01 DIAGNOSIS — Z853 Personal history of malignant neoplasm of breast: Secondary | ICD-10-CM | POA: Diagnosis present

## 2023-10-01 DIAGNOSIS — C50412 Malignant neoplasm of upper-outer quadrant of left female breast: Secondary | ICD-10-CM | POA: Insufficient documentation

## 2023-10-01 DIAGNOSIS — M810 Age-related osteoporosis without current pathological fracture: Secondary | ICD-10-CM | POA: Insufficient documentation

## 2023-10-01 DIAGNOSIS — Z923 Personal history of irradiation: Secondary | ICD-10-CM | POA: Insufficient documentation

## 2023-10-01 DIAGNOSIS — R937 Abnormal findings on diagnostic imaging of other parts of musculoskeletal system: Secondary | ICD-10-CM | POA: Diagnosis not present

## 2023-10-01 DIAGNOSIS — Z170 Estrogen receptor positive status [ER+]: Secondary | ICD-10-CM | POA: Diagnosis not present

## 2023-10-01 DIAGNOSIS — M899 Disorder of bone, unspecified: Secondary | ICD-10-CM | POA: Diagnosis not present

## 2023-10-01 NOTE — Progress Notes (Addendum)
 Patient Care Team: Adelia Homestead, MD as PCP - General (Internal Medicine) Enid Harry, MD as Consulting Physician (General Surgery) Percy Bracken, MD as Consulting Physician (Obstetrics and Gynecology) Bridgett Camps, Amber Bail, MD as Consulting Physician (Gastroenterology) Debbra Fairy, MD as Attending Physician (Neurology) Adelaide Holy, DDS as Referring Physician (Dentistry) Isidro Margo, DO as Consulting Physician (Family Medicine) Thais Fill, MD as Consulting Physician (Dermatology) Colie Dawes, MD as Attending Physician (Radiation Oncology) Adelaide Holy, DDS as Referring Physician (Dentistry) Cameron Cea, MD as Consulting Physician (Hematology and Oncology)  DIAGNOSIS:  Encounter Diagnosis  Name Primary?   Malignant neoplasm of upper-outer quadrant of left breast in female, estrogen receptor positive (HCC) Yes    CHIEF COMPLIANT: F/U to discuss Guardant results  HISTORY OF PRESENT ILLNESS:  History of Present Illness Erin [name] is a 63 year old female with breast cancer who presents for evaluation of recent Guardant testing results and MRI findings.  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 She was previously treated with anastrozole  for seven years. Anastrozole  was discontinued, and cancer markers initially decreased to zero but have since shown activity.  An MRI was performed to investigate a stable, mild focal uptake in the posterior left first rib noted on a bone scan six months ago. The MRI results are pending.  Current medications include anastrozole . 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     ALLERGIES:  is allergic to pollen extract and adhesive [tape].  MEDICATIONS:  Current Outpatient Medications  Medication Sig Dispense Refill   acyclovir  ointment (ZOVIRAX ) 5 % Apply 1  Application topically every 3 (three) hours. As needed (Patient not taking: Reported on 09/16/2023) 5 g 5   anastrozole  (ARIMIDEX ) 1 MG tablet Take 1 tablet (1 mg total) by mouth daily. 90 tablet 3   Ascorbic Acid (VITAMIN C PO) Take by mouth.     AUVI-Q  0.3 MG/0.3ML SOAJ injection auto-injector into outer thigh Injection prn anaphylaxis for 30 days     Cholecalciferol (VITAMIN D3) 2000 UNITS capsule Take 2,000 Units by mouth daily.     Ciclopirox  1 % shampoo Use topically on scalp 120 mL 0   denosumab  (PROLIA ) 60 MG/ML SOSY injection Inject 60 mg into the skin every 6 (six) months.     fexofenadine (ALLEGRA) 180 MG tablet Take 180 mg by mouth daily as needed for allergies or rhinitis.     ibuprofen (ADVIL,MOTRIN) 200 MG tablet Take 200 mg by mouth every 6 (six) hours as needed for moderate pain. (Patient not taking: Reported on 09/16/2023)     levothyroxine  (SYNTHROID ) 75 MCG tablet TAKE 1 TABLET(75 MCG) BY MOUTH DAILY BEFORE BREAKFAST 90 tablet 3   losartan  (COZAAR ) 100 MG tablet TAKE 1 TABLET(100 MG) BY MOUTH DAILY 90 tablet 3   Mometasone Furoate 370 MCG IMPL Apply 1 Application topically as directed. Five nights a week--0.1% topical solution for scalp     Olopatadine HCl (PATADAY) 0.7 % SOLN Apply to eye.     Olopatadine HCl 0.6 % SOLN Place 2 sprays into both nostrils 2 (two) times daily as needed (allergies).  (Patient not taking: Reported on 09/16/2023)     Omega-3 Fatty Acids (FISH OIL PO) Take by mouth.     Polyethyl Glycol-Propyl Glycol (SYSTANE OP) Place 1 drop into both eyes daily as needed (for allergies).  (Patient not taking: Reported on 09/16/2023)  pyrithione zinc (SELSUN  BLUE DRY SCALP) 1 % shampoo 1 application in place of regular shampoo Externally Two times a Week     sodium chloride  (OCEAN) 0.65 % SOLN nasal spray Place 1 spray into both nostrils as needed for congestion.     Turmeric 500 MG CAPS Take 500 mg by mouth daily.      No current facility-administered medications  for this visit.    PHYSICAL EXAMINATION: ECOG PERFORMANCE STATUS: 1 - Symptomatic but completely ambulatory  Vitals:   10/01/23 1526 10/01/23 1527  BP: (!) 145/73 131/72  Pulse: 83   Resp: 16   Temp: 97.8 F (36.6 C)   SpO2: 97%    Filed Weights   10/01/23 1526  Weight: 156 lb 8 oz (71 kg)    LABORATORY DATA:  I have reviewed the data as listed    Latest Ref Rng & Units 07/24/2023    9:55 AM 05/08/2023    2:35 PM 02/27/2023    3:47 PM  CMP  Glucose 70 - 99 mg/dL 86  951  85   BUN 6 - 23 mg/dL 18  24  25    Creatinine 0.40 - 1.20 mg/dL 8.84  1.66  0.63   Sodium 135 - 145 mEq/L 136  134  134   Potassium 3.5 - 5.1 mEq/L 4.3  4.5  4.4   Chloride 96 - 112 mEq/L 102  101  101   CO2 19 - 32 mEq/L 30  30  30    Calcium 8.4 - 10.5 mg/dL 8.9  9.8  9.4   Total Protein 6.0 - 8.3 g/dL 6.7  6.6  6.7   Total Bilirubin 0.2 - 1.2 mg/dL 1.4  1.4  1.1   Alkaline Phos 39 - 117 U/L 57  88  93   AST 0 - 37 U/L 18  20  17    ALT 0 - 35 U/L 12  13  11      Lab Results  Component Value Date   WBC 3.6 (L) 07/24/2023   HGB 15.0 07/24/2023   HCT 44.3 07/24/2023   MCV 89.1 07/24/2023   PLT 208.0 07/24/2023   NEUTROABS 3.7 05/08/2023    ASSESSMENT & PLAN:  Malignant neoplasm of upper-outer quadrant of left breast in female, 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 Oncotype DX: 17, 10 to 12% distant recurrence risk over 10 years Post mastectomy radiation February 2010-May 2015: Tamoxifen  stopped because of endometrial hyperplasia 04/21/2014: Recurrence: Skin biopsy adenocarcinoma ER/PR positive 06/01/2014: Wide excision with negative margins 07/12/2014-08/16/2014: Chest wall radiation 05/06/2014: PET/CT: Activity in the right ischial tuberosity: Biopsy negative Repeat biopsy: Negative 05/09/2014-02/20/2015: Goserelin 03/13/2015: BSO: Benign 04/26/2019-05/05/2019: SBRT to right ischial tuberosity  lesion 08/29/2014-08/28/2021: Anastrozole  stopped after 7 years   Prior treatment: Anastrozole  started 08/29/2014 -08/28/2021 along with Prolia  for osteoporosis (both have been discontinued)  Current treatment: Restarted anastrozole  October 2024    Surveillance: Signatera blood testing for circulating cell free DNA 10/2022: Signatera positive (0.02 MTM/ml) 02/07/2023: Della Fent positive (0.07 MTM/ml) 05/14/2022: Della Fent: Negative 08/07/2023: Signatera: +0.33   CT CAP and bone scan 09/01/2023: Similar appearance of the treated bone metastases right ischial tuberosity with no convincing evidence of active malignancy within CAP, bone scan: Stable mild uptake left first rib.   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 ESR1 mutation causes estrogen-independent growth. PIK3CA mutation enhances survival. Immunotherapy not viable due to low tumor mutation burden. Elacestrant proposed for ESR1 mutation. Treatment decisions complicated by reliance on non-standard cell-free DNA tests. Second opinion considered for treatment evaluation. - Discontinue anastrozole . - Discuss elacestrant (Orserdu) targeting ESR1 mutation. Monitor for gastrointestinal issues, fatigue, and cholesterol levels. - Consider second opinion at [facility] for treatment approach.   Suspicious rib lesion, possible metastasis Mild focal uptake in posterior left first rib on bone scan. Differential includes metastasis or benign lesion. MRI pending. Previous bone biopsies negative, indicating potential false negatives. - Await MRI report for  rib lesion evaluation. - Consider biopsy if feasible and safe. - Discuss stereotactic radiation if lesion is malignant.  Ischial tuberosity lesion Previous biopsies negative, typical for bone lesions. No current intervention needed. - Monitor for changes or symptoms.      No orders of the defined types were placed in this encounter.  The patient has a good understanding of the overall plan. she agrees with it. she will call with any problems that may develop before the next visit here. Total time spent: 30 mins including face to face time and time spent for planning, charting and co-ordination of care   Viinay K Chiquita Heckert, MD 10/01/23

## 2023-10-01 NOTE — Assessment & Plan Note (Signed)
 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 Oncotype DX: 17, 10 to 12% distant recurrence risk over 10 years Post mastectomy radiation February 2010-May 2015: Tamoxifen  stopped because of endometrial hyperplasia 04/21/2014: Recurrence: Skin biopsy adenocarcinoma ER/PR positive 06/01/2014: Wide excision with negative margins 07/12/2014-08/16/2014: Chest wall radiation 05/06/2014: PET/CT: Activity in the right ischial tuberosity: Biopsy negative Repeat biopsy: Negative 05/09/2014-02/20/2015: Goserelin 03/13/2015: BSO: Benign 04/26/2019-05/05/2019: SBRT to right ischial tuberosity lesion 08/29/2014-08/28/2021: Anastrozole  stopped after 7 years   Prior treatment: Anastrozole  started 08/29/2014 -08/28/2021 along with Prolia  for osteoporosis (both have been discontinued)  Current treatment: Restarted anastrozole  October 2024    Surveillance: Signatera blood testing for circulating cell free DNA 10/2022: Signatera positive (0.02 MTM/ml) 02/07/2023: Della Fent positive (0.07 MTM/ml) 05/14/2022: Della Fent: Negative 08/07/2023: Signatera: +0.33   CT CAP and bone scan 09/01/2023: Similar appearance of the treated bone metastases right ischial tuberosity with no convincing evidence of active malignancy within CAP, bone scan: Stable mild uptake left first rib.   Treatment plan: Chest MRI 09/22/2023:  Guardant 360   If the chest MRI shows that this rib lesion is truly malignant, then we might consider SRS to the rib lesion. If the Guardant360 reveals that she has an ESR 1 mutation and we will discuss switching her to Faslodex versus Elacestrant.

## 2023-10-02 ENCOUNTER — Encounter: Payer: Self-pay | Admitting: Hematology and Oncology

## 2023-10-02 ENCOUNTER — Telehealth: Payer: Self-pay | Admitting: Internal Medicine

## 2023-10-02 ENCOUNTER — Other Ambulatory Visit: Payer: Self-pay | Admitting: *Deleted

## 2023-10-02 DIAGNOSIS — C7951 Secondary malignant neoplasm of bone: Secondary | ICD-10-CM

## 2023-10-02 DIAGNOSIS — Z170 Estrogen receptor positive status [ER+]: Secondary | ICD-10-CM

## 2023-10-02 NOTE — Progress Notes (Signed)
 Per MD request RN successfully faxed referral to [facility] [phone]).

## 2023-10-02 NOTE — Telephone Encounter (Signed)
 Copied from CRM [id number]. Topic: Appointments - Scheduling Inquiry for Clinic >> Oct 02, 2023  8:56 AM Martinique E wrote: Reason for CRM: Patient called in wanting to schedule a pneumonia vaccine. Callback number for patient is [phone] to schedule.  ----  Does pt need orders for vaccine? TDW

## 2023-10-03 NOTE — Telephone Encounter (Signed)
 Patient called back in wanting to schedule a pneumonia vaccine she would like a call back regarding this

## 2023-10-07 ENCOUNTER — Encounter: Payer: Self-pay | Admitting: Hematology and Oncology

## 2023-10-07 LAB — GUARDANT 360

## 2023-10-10 ENCOUNTER — Ambulatory Visit (INDEPENDENT_AMBULATORY_CARE_PROVIDER_SITE_OTHER)

## 2023-10-10 ENCOUNTER — Ambulatory Visit: Payer: BC Managed Care – PPO

## 2023-10-10 ENCOUNTER — Other Ambulatory Visit: Payer: BC Managed Care – PPO

## 2023-10-10 DIAGNOSIS — Z23 Encounter for immunization: Secondary | ICD-10-CM | POA: Diagnosis not present

## 2023-10-10 NOTE — Progress Notes (Signed)
 Patient visits today for their pneumonia vaccine (prevnar 20). Patient informed of what they had received and tolerated injection well. Patient notified to reach out to office if needed

## 2023-10-14 ENCOUNTER — Other Ambulatory Visit: Payer: Self-pay | Admitting: Internal Medicine

## 2023-11-03 NOTE — Progress Notes (Signed)
 [facility] Breast Medical Oncology     Nilsa Macht [id number] [place] [address] Medical oncology: Dr. Mackey Chad ([facility])   Erin [name] is an 63 y.o. postmenopausal female presenting for second opinion.   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 Margins negative. Breast biomarkers ER 95% PR 66% HER2 IHC negative.   07/12/2014 Chest wall radiation   05/06/2014: PET scan: FDG activity in right ischial tuberosity. She underwent two biopsies which were negative.   05/09/2014: Goserelin > BSO 03/13/2015   04/26/2019 SBRT to right ischial tuberosity lesion  08/29/2014-08/28/2021 Anastrozole    After stopping anastrozole  has been undergoing surveillance with signatera blood testing:  10/2022: Signatera positive (0.02 MTM/ml)  02/07/2023: Burney positive (0.07 MTM/ml)  02/2023 Anastrozole  restarted   05/14/2022: Signatera: Negative  08/07/2023: Signatera: +0.33  09/01/23 CT CAP ([facility]): no evidence of disease, similar appearance of treated osseous met in right ischial tuberosity. [facility] review confirmed.   09/01/23 NM bone scan ([facility]): mild uptake in left rib suspicious for mets. No uptake of radiotracer in prior treated right ischium. [facility] review: small focus of uptake in left T2 transverse process (unchanged from bone scan 03/03/2023) suspicious for metastatic disease with some signal abnormality on the subsequently performed MRI.   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 No abnormality is seen in this region on multiple prior CT scans or PET CTs (including 08/2023). No definite lesion noted on MRI. Recommend routine surveillance. There is mild enhancement along the paraspinal musculature of the left T2 transverse on current MRI that is normal on prior CT images and PET. There is increased signal at left clavicular head with no corresponding lesion seen on CT (nonspecific and may represent benign cyst). No uptake seen on prior bone scans.    TODAY 11/04/2023: She presents today for second opinion. She has been off of the anastrozole  since guardant testing has returned. She previously had weight gain, hot flashes, but overall tolerated this well. She denies any bone pain or other concerning symptoms.   PMH - HTN, hypothyroidism  SH - lives alone in [place] - unclear if genetic testing done   GYNECOLOGIC HISTORY     BREAST CANCER MULTIDISCIPLINARY       10/30/2023   17:47  Breast Center Questionnaire  What type of visit will you be having? Second Opinion Visit  Breast Related Current Complaint New Breast Problem  How long have you had symptoms? N/A - I do not have symptoms  Which breast has a problem? Neither  What changes have you noticed? (Select all that apply) None  What was the date of your last mammogram? 09/22/2023  Last Mammogram Imaging Center Location: [facility] in [place], [place]  Did you have Chemotherapy? No  Did you have Endocrine (Anti-Hormone) therapy? Yes  Dates therapy taken (Please specify MM/YYYY ) tamoxifen  (about 5 years); anastrozole  (7 years ending 08/29/21; restarted 02/21/23 and stopped 09/01/23)  Were  you prescribed any of the following medications? Tamoxifen    Anastrozole  (Arimidex )  Have you ever had radiation therapy? Yes  How long was treatment (# of weeks)? 7  Where was your treatment given (hospital)? [facility] in [place]  When were you treated (year)?  2010 -- additional radiation at [facility] in [place] 2016 and 2021  What area of your body was treated? 2010 & 2016 (Breast), 2021 (ischial tuberosity)  Have you had more than one course of radiation? Yes  Are you of Ashkenazi Jewish ancestry? No  Have you had genetic testing (using blood or saliva) to look for a change (or mutation) that would put you at higher risk for getting certain cancers? Yes  If yes, what health care facility ordered your genetic testing? [facility]  If yes, when was your genetic test done? Feb 2010  If yes, do you have a copy of your genetic test results? Yes  Have you had a bone density study (for osteoporosis)? Yes  Have you had a colonoscopy (to look for colon cancer)? Yes  Have you had a menstrual period within the past six months? No  Are you concerned about maintaining fertility? No  Age when periods stopped 2016  Why did your periods stop? Other  If Post-Menopausal, have you taken Hormone Replacment Therapy (HRT)? No  Age of starting menstrual period (Menarche) 13  Any prior pregnancies? No  Have you ever used Oral Contraceptives? No  Have you ever used an IUD (intrauterine device)? No  Have you ever used fertility drugs? No  Do you exercise regularly? Yes  If yes, how often? 5  Do you follow any particular diet? No    PMH Past Medical History:  Diagnosis Date  . Allergic rhinitis   . Breast cancer (CMS/HHS-HCC)   . Diverticulosis   . Hypertension   . Hypothyroidism   . Osteopenia   . Vision abnormalities      PSH Past Surgical History:  Procedure Laterality Date  . ARTHROSCOPY KNEE Right   . INGUINAL HERNIA REPAIR    . MASTECTOMY SIMPLE Right   . MASTECTOMY W/ NODES PARTIAL Left   . TONSILLECTOMY      FH Family History  Problem Relation Age of Onset  . Lung cancer Mother   . Cataracts Maternal Grandmother      Social History: Social History   Socioeconomic History  . Marital status:  Single  Tobacco Use  . Smoking status: Never  . Smokeless tobacco: Never  Vaping Use  . Vaping status: Never Used  Substance and Sexual Activity  . Alcohol use: Never  . 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  Currently single and living alone.   Social Drivers of Corporate investment banker Strain: Low Risk  (10/30/2023)   Overall Financial Resource Strain (CARDIA)   . Difficulty of Paying Living Expenses: Not hard at all  Food Insecurity: No Food Insecurity (10/30/2023)   Hunger Vital Sign   . Worried About Programme researcher, broadcasting/film/video in the Last Year: Never true   . Ran Out of Food in the Last Year: Never true  Transportation Needs: No Transportation Needs (10/30/2023)   PRAPARE - Transportation   . Lack of Transportation (Medical): No   . Lack of Transportation (Non-Medical): No  Physical Activity: Sufficiently Active (10/30/2023)   Exercise Vital Sign   . Days of Exercise per Week: 5 days   . Minutes of  Exercise per Session: 40 min  Stress: Patient Declined (10/30/2023)   [employer] of Occupational Health - Occupational Stress Questionnaire   . Feeling of Stress : Patient declined  Social Connections: Unknown (10/30/2023)   Social Connection and Isolation Panel   . Frequency of Communication with Friends and Family: Patient declined   . Frequency of Social Gatherings with Friends and Family: Patient declined   . Attends Religious Services: Never   . Active Member of Clubs or Organizations: Patient declined   . Attends Banker Meetings: Patient declined   . Marital Status: Never married  Housing Stability: Low Risk  (10/30/2023)   Housing Stability Vital Sign   . Unable to Pay for Housing in the Last Year: No   . Number of Times Moved in the Last Year: 0   . Homeless in the Last Year: No     Allergies: Allergies  Allergen Reactions  . Pollen Extracts Unknown  . Adhesive Rash     Current  Medications: Current Outpatient Medications  Medication Sig Dispense Refill  . acyclovir  (ZOVIRAX ) 5 % ointment Apply topically.    SABRA ascorbic acid (VITAMIN C ORAL) Take by mouth    . AUVI-Q  0.3 mg/0.3 mL auto-injector auto-injector into outer thigh Injection prn anaphylaxis for 30 days    . cholecalciferol (VITAMIN D3) 2,000 unit capsule Take by mouth.    . ciclopirox  1 % shampoo 1 Application    . fexofenadine (ALLEGRA) 180 MG tablet Take by mouth    . ibuprofen (MOTRIN) 200 MG tablet Take 200 mg by mouth    . levothyroxine  (SYNTHROID , LEVOTHROID) 75 MCG tablet Take by mouth.    . losartan  (COZAAR ) 100 MG tablet Take by mouth.    . MOMETASONE FUROATE, BULK, MISC Use Apply 1 application topically as directed. 3-4 times a week.    SABRA PROPYLENE GLYCOL/PEG 400/PF (SYSTANE, PF, OPHTH) Apply to eye.    . pyrithione zinc (SELSUN  BLUE, PYRITHIONE ZINC,) 1 % shampoo 1 application in place of regular shampoo Externally Two times a Week    . sodium chloride  (SODIUM CHLORIDE ) 0.65 % nasal spray by Nasal route.    . TURMERIC, BULK, MISC Use    . anastrozole  (ARIMIDEX ) 1 mg tablet Take by mouth. (Patient not taking: Reported on 11/04/2023)    . calcium carbonate 600 mg (1,500 mg) Tab tablet Take by mouth. (Patient not taking: Reported on 11/24/2019)    . denosumab  (PROLIA ) 60 mg/mL inj syringe Prolia     . denosumab  (XGEVA ) 120 mg/1.7 mL (70 mg/mL) injection Inject subcutaneously. (Patient not taking: Reported on 11/04/2023)    . goserelin (ZOLADEX ) 10.8 mg injection Inject subcutaneously. (Patient not taking: Reported on 11/24/2019)    . ketoconazole  (NIZORAL ) 2 % shampoo  (Patient not taking: Reported on 11/04/2023)    . loratadine (CLARITIN) 10 mg tablet Take by mouth. (Patient not taking: Reported on 11/24/2019)    . olopatadine (PATANASE) 0.6 % nasal spray Place 2 sprays into both nostrils 2 (two) times daily (Patient not taking: Reported on 11/04/2023)    . omega-3 fatty acids-fish oil 300-1,000 mg capsule  Take by mouth     No current facility-administered medications for this visit.        Physical Exam: BP 110/67 (BP Location: Right upper arm, Patient Position: Sitting, BP Cuff Size: Adult)   Pulse 78   Temp 36.9 C (98.4 F) (Oral)   Resp 16   Ht 163 cm (5' 4.17)   Wt 70.3 kg (  154 lb 15.7 oz)   SpO2 96%   BMI 26.46 kg/m Body surface area is 1.78 meters squared.  ECOG Performance Status: 0 General appearance: Appears well, in no acute distress.  Lymph Nodes: No cervical, supraclavicular or axillary lymphadenopathy. Breast: s/p bilateral mastectomy with well healed incisions, no suspicious masses  Cardiac:   not tachycardic Lungs: breathing unlabored Abdomen/GI: Soft, non-tender, and non-distended.  Skin: Color and texture normal. No visible rashes.  Neurologic: alert and oriented, non focal   Laboratory:    Chemistry   No results found for: NA, K, CL, CO2, BUN, CREATININE, GLUCOSE No results found for: CALCIUM, ALKPHOS, AST, ALT, TBILI     Last CBC and neutrophil count: No results found for: HGBNo results found for: HCTNo results found for: PLTNo results found for: Scnetx results found for: NEUTOPHILPCTNo results found for: NEUTCT  Imaging, labs and pathology personally reviewed and discussed with patient.    Cancer Staging <redacted file path>  No matching staging information was found for the patient.  ASSESSMENT AND PLAN  Reviewed history and available records in detail.  Initial presentation in 2010 with a left sided T2N0 IDC G1 ER+ HER2- RS 17 for which she underwent bilateral mastectomy, radiation, tamoxifen  x 5 years. She then in 2016 had left chest wall recurrence for which she underwent surgery, again ER/PR pos HER2 negative. She had radiation, and then started anastrozole  for 7 years. She underwent PET imaging which showed FDG activity in the right ischial tuberosity, for which she underwent two biopsies which were negative,  but did receive SBRT to right ischial tuberosity lesion in 2021 (though no increase in size). She had been on anastrozole  since 2016-2023 and then restarted anastrozole  01/2023 based on signatera testing increasing.   Staging scans 09/01/23 showed uptake in the left posterior rib on bone scan without correlate on MRI on or CT.  Small focus of activity in the left T2 transverse process as well noted. Discussed first step would be to pursue biopsy of one of these areas if feasible. I reviewed with radiology and at this point there is no clear focus to biopsy. Would recommend obtaining short interval scans in 2-3 months to assess for change in these foci and can potentially biopsy if changing of clear focus.   She is currently on anastrozole  for adjuvant treatment. I think it is reasonable to continue for now.  If this is biopsy proven metastatic HR+ HER2- negative breast cancer would recommend switch in therapy. Discussed there is no data to switching based on guardant testing (ESR1 and PIK3CA mutation) in the absence of biopsy confirmed metastatic disease, as we are unsure if this impacts overall survival and would come with considerable toxicity.    My hope is that these findings on bone scan do not represent metastatic disease and are stable over time. However, if she is found to have metastatic HR+ breast cancer would favor PIK3CA based treatment with Inavo, palbo, fulvestrant. Patients with development on metastatic HR+ HER2- breast cancer while on (or within 12 months of completing) endocrine therapy with PIK3CA mutation are eligible for inavolisib, palbociclib and fulvestrant. In the Inavo 120 trial patients randomized to inavolisib + palbociclib + fulvestrant arm had almost an 8 month improvement in PFS (15 v. 7.3 mo) and 7 month improvement in OS (34 mo v. 27 mo) compared with those in the fulvestrant + palbociclib arm. ORR rates were also superior (58 v 25%). Most common side effects include low blood  counts with risk  of infection, hyperglycemia, diarrhea, and mouth sores.   She will continue to follow up locally. She will return in 3 months to get staging scans and consider biopsy. She asked several excellent questions all of which were answered to the best of my ability.   Future Appointments     Date/Time Provider Department Center Visit Type   02/11/2024 10:30 AM [facility] INJ 1N04 [facility]  Nuc Med [facility] NM BONE SCAN WHOLE BODY INJ   02/11/2024 11:20 AM (Arrive by 11:00 AM) CC CT 1 Cancer Center  CT Cancer Ctr CT CHEST ABD PEL W INCL MIPS   02/11/2024 12:30 PM CC NM SR 1N09 Cancer Center  Nuc Med [facility] NM BONE SCAN WHOLE BODY SCAN   02/11/2024 2:00 PM (Arrive by 1:45 PM) Natarajan, Abirami, MD [facility] RETURN VISIT          Attending Attestation:    I personally performed the service. (TP)  I spent a total over 130 minutes with Richerd Cedar and this includes over 65 minutes of face to face time and non-face to face time preparing/documenting (65 minutes, reviewing images with radiology) to see the patient (eg, review of tests), obtaining and/or reviewing separately obtained history, independently interpreting results (not separately reported), communicating results to the patient/family/caregiver, and Care coordination (not separately reported).  All questions were answered to the best of my ability.    Abirami Natarajan, MD

## 2023-11-10 ENCOUNTER — Telehealth: Payer: Self-pay

## 2023-11-10 ENCOUNTER — Other Ambulatory Visit: Payer: Self-pay

## 2023-11-10 NOTE — Telephone Encounter (Signed)
 Pt called to ensure Prolia  has auth for her inj appt coming up 7/24 and also reports she received a bill for $10,000 for Guardant Reveal from her insurance company.   Attempted to return pt's call. Left detailed message on identified VM letting the pt know our PA team is working on auth for her injection, and advised that an EOB is not a bill and to disregard, as [employer] will not bill her for that. Advised to call back with any questions, but will hand off this concern for Prolia  PA to incoming nurse to f/u.

## 2023-11-12 ENCOUNTER — Other Ambulatory Visit: Payer: Self-pay | Admitting: *Deleted

## 2023-11-12 DIAGNOSIS — Z170 Estrogen receptor positive status [ER+]: Secondary | ICD-10-CM

## 2023-11-12 NOTE — Assessment & Plan Note (Signed)
 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 Oncotype DX: 17, 10 to 12% distant recurrence risk over 10 years Post mastectomy radiation February 2010-May 2015: Tamoxifen  stopped because of endometrial hyperplasia 04/21/2014: Recurrence: Skin biopsy adenocarcinoma ER/PR positive 06/01/2014: Wide excision with negative margins 07/12/2014-08/16/2014: Chest wall radiation 05/06/2014: PET/CT: Activity in the right ischial tuberosity: Biopsy negative Repeat biopsy: Negative 05/09/2014-02/20/2015: Goserelin 03/13/2015: BSO: Benign 04/26/2019-05/05/2019: SBRT to right ischial tuberosity lesion 08/29/2014-08/28/2021: Anastrozole  stopped after 7 years   Prior treatment: Anastrozole  started 08/29/2014 -08/28/2021 along with Prolia  for osteoporosis (both have been discontinued)  Current treatment: Restarted anastrozole  October 2024    Surveillance: Signatera blood testing for circulating cell free DNA 10/2022: Signatera positive (0.02 MTM/ml) 02/07/2023: Burney positive (0.07 MTM/ml) 05/14/2022: Burney: Negative 08/07/2023: Signatera: +0.33   CT CAP and bone scan 09/01/2023: Similar appearance of the treated bone metastases right ischial tuberosity with no convincing evidence of active malignancy within CAP, bone scan: Stable mild uptake left first rib.   Treatment plan: Chest MRI 09/22/2023: awaiting the results of MRI to decide on biopsy Guardant 360: ESR-1 Mutation, PIK3CA Mutation   Referral to [facility] for a second opinion on whether to treat with elacestrant or observe with serial scans

## 2023-11-13 ENCOUNTER — Inpatient Hospital Stay: Payer: Self-pay

## 2023-11-13 ENCOUNTER — Other Ambulatory Visit: Payer: Self-pay | Admitting: *Deleted

## 2023-11-13 ENCOUNTER — Other Ambulatory Visit: Payer: BC Managed Care – PPO

## 2023-11-13 ENCOUNTER — Inpatient Hospital Stay: Payer: Self-pay | Attending: Hematology and Oncology | Admitting: Hematology and Oncology

## 2023-11-13 VITALS — BP 127/61 | HR 69 | Temp 98.3°F | Resp 18 | Ht 65.5 in | Wt 158.9 lb

## 2023-11-13 DIAGNOSIS — C792 Secondary malignant neoplasm of skin: Secondary | ICD-10-CM | POA: Insufficient documentation

## 2023-11-13 DIAGNOSIS — Z170 Estrogen receptor positive status [ER+]: Secondary | ICD-10-CM

## 2023-11-13 DIAGNOSIS — N8500 Endometrial hyperplasia, unspecified: Secondary | ICD-10-CM

## 2023-11-13 DIAGNOSIS — C50412 Malignant neoplasm of upper-outer quadrant of left female breast: Secondary | ICD-10-CM | POA: Diagnosis not present

## 2023-11-13 DIAGNOSIS — Z1732 Human epidermal growth factor receptor 2 negative status: Secondary | ICD-10-CM | POA: Diagnosis not present

## 2023-11-13 DIAGNOSIS — M899 Disorder of bone, unspecified: Secondary | ICD-10-CM | POA: Insufficient documentation

## 2023-11-13 DIAGNOSIS — C7951 Secondary malignant neoplasm of bone: Secondary | ICD-10-CM

## 2023-11-13 DIAGNOSIS — Z79811 Long term (current) use of aromatase inhibitors: Secondary | ICD-10-CM | POA: Diagnosis not present

## 2023-11-13 DIAGNOSIS — C50912 Malignant neoplasm of unspecified site of left female breast: Secondary | ICD-10-CM

## 2023-11-13 LAB — CBC WITH DIFFERENTIAL (CANCER CENTER ONLY)
Abs Immature Granulocytes: 0 K/uL (ref 0.00–0.07)
Basophils Absolute: 0 K/uL (ref 0.0–0.1)
Basophils Relative: 1 %
Eosinophils Absolute: 0.1 K/uL (ref 0.0–0.5)
Eosinophils Relative: 2 %
HCT: 41.5 % (ref 36.0–46.0)
Hemoglobin: 14.8 g/dL (ref 12.0–15.0)
Immature Granulocytes: 0 %
Lymphocytes Relative: 25 %
Lymphs Abs: 1.6 K/uL (ref 0.7–4.0)
MCH: 30.5 pg (ref 26.0–34.0)
MCHC: 35.7 g/dL (ref 30.0–36.0)
MCV: 85.6 fL (ref 80.0–100.0)
Monocytes Absolute: 0.5 K/uL (ref 0.1–1.0)
Monocytes Relative: 8 %
Neutro Abs: 4 K/uL (ref 1.7–7.7)
Neutrophils Relative %: 64 %
Platelet Count: 192 K/uL (ref 150–400)
RBC: 4.85 MIL/uL (ref 3.87–5.11)
RDW: 11.9 % (ref 11.5–15.5)
WBC Count: 6.1 K/uL (ref 4.0–10.5)
nRBC: 0 % (ref 0.0–0.2)

## 2023-11-13 LAB — CMP (CANCER CENTER ONLY)
ALT: 11 U/L (ref 0–44)
AST: 18 U/L (ref 15–41)
Albumin: 4 g/dL (ref 3.5–5.0)
Alkaline Phosphatase: 54 U/L (ref 38–126)
Anion gap: 5 (ref 5–15)
BUN: 20 mg/dL (ref 8–23)
CO2: 26 mmol/L (ref 22–32)
Calcium: 9.3 mg/dL (ref 8.9–10.3)
Chloride: 102 mmol/L (ref 98–111)
Creatinine: 0.88 mg/dL (ref 0.44–1.00)
GFR, Estimated: >60 mL/min (ref >60)
Glucose, Bld: 101 mg/dL — ABNORMAL HIGH (ref 70–99)
Potassium: 4.3 mmol/L (ref 3.5–5.1)
Sodium: 133 mmol/L — ABNORMAL LOW (ref 135–145)
Total Bilirubin: 1.2 mg/dL (ref 0.0–1.2)
Total Protein: 6.7 g/dL (ref 6.5–8.1)

## 2023-11-13 LAB — GENETIC SCREENING ORDER

## 2023-11-13 MED ORDER — DENOSUMAB 60 MG/ML ~~LOC~~ SOSY
60.0000 mg | PREFILLED_SYRINGE | Freq: Once | SUBCUTANEOUS | Status: AC
Start: 2023-11-13 — End: 2023-11-13
  Administered 2023-11-13: 60 mg via SUBCUTANEOUS
  Filled 2023-11-13: qty 1

## 2023-11-13 NOTE — Progress Notes (Signed)
 Patient Care Team: Rollene Almarie LABOR, MD as PCP - General (Internal Medicine) Ebbie Cough, MD as Consulting Physician (General Surgery) Georgia Blonder, MD as Consulting Physician (Obstetrics and Gynecology) Albertus, Gordy HERO, MD as Consulting Physician (Gastroenterology) Buck Saucer, MD as Attending Physician (Neurology) Melba Pool, DDS as Referring Physician (Dentistry) Claudene Arthea HERO, DO as Consulting Physician (Family Medicine) Robinson Pao, MD as Consulting Physician (Dermatology) Izell Domino, MD as Attending Physician (Radiation Oncology) Melba Pool, DDS as Referring Physician (Dentistry) Odean Potts, MD as Consulting Physician (Hematology and Oncology)  DIAGNOSIS:  Encounter Diagnosis  Name Primary?   Malignant neoplasm of upper-outer quadrant of left breast in female, estrogen receptor positive (HCC) Yes   CHIEF COMPLIANT: Follow-up after second opinion at [facility]  HISTORY OF PRESENT ILLNESS:  History of Present Illness Erin [name] is a 63 year old female with breast cancer who presents for follow-up regarding her treatment and surveillance plan.  She has been undergoing regular surveillance with scans, with varying interpretations regarding suspicious areas. There is no consensus on progression or the need for biopsy. Her last scans in May showed possible suspicious areas, but opinions differed among clinicians.  Anastrozole  was discontinued about a month and a half ago. There is ongoing discussion about the duration of anastrozole  treatment, considering her tolerance and potential benefits despite a small percentage of mutation that may not respond to it.  She is undergoing Signatera testing for monitoring purposes. The test results can influence the timing of her scans if there are significant changes in the markers.     ALLERGIES:  is allergic to pollen extract and adhesive [tape].  MEDICATIONS:  Current Outpatient Medications   Medication Sig Dispense Refill   acyclovir  ointment (ZOVIRAX ) 5 % APPLY TOPICALLY TO THE AFFECTED AREA EVERY 3 HOURS AS NEEDED 5 g 5   anastrozole  (ARIMIDEX ) 1 MG tablet Take 1 tablet (1 mg total) by mouth daily. 90 tablet 3   AUVI-Q  0.3 MG/0.3ML SOAJ injection auto-injector into outer thigh Injection prn anaphylaxis for 30 days     Cholecalciferol (VITAMIN D3) 2000 UNITS capsule Take 2,000 Units by mouth daily.     Ciclopirox  1 % shampoo Use topically on scalp 120 mL 0   denosumab  (PROLIA ) 60 MG/ML SOSY injection Inject 60 mg into the skin every 6 (six) months.     fexofenadine (ALLEGRA) 180 MG tablet Take 180 mg by mouth daily as needed for allergies or rhinitis.     ibuprofen (ADVIL,MOTRIN) 200 MG tablet Take 200 mg by mouth every 6 (six) hours as needed for moderate pain.     levothyroxine  (SYNTHROID ) 75 MCG tablet TAKE 1 TABLET(75 MCG) BY MOUTH DAILY BEFORE BREAKFAST 90 tablet 3   losartan  (COZAAR ) 100 MG tablet TAKE 1 TABLET(100 MG) BY MOUTH DAILY 90 tablet 3   Mometasone Furoate 370 MCG IMPL Apply 1 Application topically as directed. Five nights a week--0.1% topical solution for scalp     Omega-3 Fatty Acids (FISH OIL PO) Take by mouth.     Polyethyl Glycol-Propyl Glycol (SYSTANE OP) Place 1 drop into both eyes daily as needed (for allergies).      pyrithione zinc (SELSUN  BLUE DRY SCALP) 1 % shampoo 1 application in place of regular shampoo Externally Two times a Week     sodium chloride  (OCEAN) 0.65 % SOLN nasal spray Place 1 spray into both nostrils as needed for congestion.     Turmeric 500 MG CAPS Take 500 mg by mouth daily.  Ascorbic Acid (VITAMIN C PO) Take by mouth. (Patient not taking: Reported on 11/13/2023)     No current facility-administered medications for this visit.    PHYSICAL EXAMINATION: ECOG PERFORMANCE STATUS: 1 - Symptomatic but completely ambulatory  Vitals:   11/13/23 1457  BP: 127/61  Pulse: 69  Resp: 18  Temp: 98.3 F (36.8 C)  SpO2: 100%   Filed  Weights   11/13/23 1457  Weight: 158 lb 14.4 oz (72.1 kg)    Physical Exam   (exam performed in the presence of a chaperone)  LABORATORY DATA:  I have reviewed the data as listed    Latest Ref Rng & Units 07/24/2023    9:55 AM 05/08/2023    2:35 PM 02/27/2023    3:47 PM  CMP  Glucose 70 - 99 mg/dL 86  895  85   BUN 6 - 23 mg/dL 18  24  25    Creatinine 0.40 - 1.20 mg/dL 9.19  9.15  9.12   Sodium 135 - 145 mEq/L 136  134  134   Potassium 3.5 - 5.1 mEq/L 4.3  4.5  4.4   Chloride 96 - 112 mEq/L 102  101  101   CO2 19 - 32 mEq/L 30  30  30    Calcium 8.4 - 10.5 mg/dL 8.9  9.8  9.4   Total Protein 6.0 - 8.3 g/dL 6.7  6.6  6.7   Total Bilirubin 0.2 - 1.2 mg/dL 1.4  1.4  1.1   Alkaline Phos 39 - 117 U/L 57  88  93   AST 0 - 37 U/L 18  20  17    ALT 0 - 35 U/L 12  13  11      Lab Results  Component Value Date   WBC 6.1 11/13/2023   HGB 14.8 11/13/2023   HCT 41.5 11/13/2023   MCV 85.6 11/13/2023   PLT 192 11/13/2023   NEUTROABS 4.0 11/13/2023    ASSESSMENT & PLAN:  Malignant neoplasm of upper-outer quadrant of left breast in female, 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 Oncotype DX: 17, 10 to 12% distant recurrence risk over 10 years Post mastectomy radiation February 2010-May 2015: Tamoxifen  stopped because of endometrial hyperplasia 04/21/2014: Recurrence: Skin biopsy adenocarcinoma ER/PR positive 06/01/2014: Wide excision with negative margins 07/12/2014-08/16/2014: Chest wall radiation 05/06/2014: PET/CT: Activity in the right ischial tuberosity: Biopsy negative Repeat biopsy: Negative 05/09/2014-02/20/2015: Goserelin 03/13/2015: BSO: Benign 04/26/2019-05/05/2019: SBRT to right ischial tuberosity lesion 08/29/2014-08/28/2021: Anastrozole  stopped after 7 years   Prior treatment: Anastrozole  started 08/29/2014 -08/28/2021 along with Prolia  for osteoporosis (both have been discontinued)  Current  treatment: Restarted anastrozole  October 2024    Surveillance: Signatera blood testing for circulating cell free DNA 10/2022: Signatera positive (0.02 MTM/ml) 02/07/2023: Burney positive (0.07 MTM/ml) 05/14/2022: Burney: Negative 08/07/2023: Signatera: +0.33   CT CAP and bone scan 09/01/2023: Similar appearance of the treated bone metastases right ischial tuberosity with no convincing evidence of active malignancy within CAP, bone scan: Stable mild uptake left first rib.   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 Collins who suggested continuation of anastrozole  therapy and monitoring with scans.  She will be undergoing CT scans at [facility] in October. We discussed at length about the role of Signatera testing and I agree with Dr. Natarajan that  the utility of Signatera is unclear into deciding how to treat patients with breast cancer.  However if there is a clear trend we might use a test to obtain scans sooner than later.  Patient is comfortable with the plan and will continue with anastrozole . We will see her back in November to go over the results of the scans done at [facility]. Assessment & Plan Malignant neoplasm of upper-outer quadrant of left breast Estrogen receptor-positive breast cancer with no evidence of progression. Anastrozole  therapy resumed due to potential benefit despite mixed evidence on treatment duration. - Resume anastrozole  therapy. - Monitor with Signatera tests for changes. - Schedule mid-October scans for progression evaluation. - Re-evaluate treatment if progression is evident.  Suspicious rib lesion, possible metastasis Suspicious rib lesion with no clear progression. Monitoring preferred over biopsy due to potential impact on treatment decisions. - Continue surveillance with mid-October scans. - Evaluate scans for new or  worsening lesions. - Consider biopsy if significant changes occur.  Ischial tuberosity lesion Ischial tuberosity lesion. No intervention unless future scans show changes. - Monitor with mid-October scans. - Reassess intervention need based on scan results.      No orders of the defined types were placed in this encounter.  The patient has a good understanding of the overall plan. she agrees with it. she will call with any problems that may develop before the next visit here. Total time spent: 30 mins including face to face time and time spent for planning, charting and co-ordination of care   Erin MARLA Chad, MD 11/13/23

## 2023-12-02 LAB — SIGNATERA ONLY (NATERA MANAGED)
SIGNATERA MTM READOUT: 0.13 MTM/ml — AB
SIGNATERA TEST RESULT: POSITIVE — AB

## 2023-12-31 ENCOUNTER — Telehealth: Payer: Self-pay

## 2023-12-31 NOTE — Telephone Encounter (Signed)
 Copied from CRM [id number]. Topic: Clinical - Medication Question >> Dec 31, 2023  3:54 PM [place] J wrote: Reason for CRM: Patient is wondering if a prescription for a COVID vaccination could be printed as a physical copy for her so she can take it to her preferred pharmacy. Patient comes in for her flu vaccination on the 26th of September and would prefer to pick it up at that time.  Patient is wondering if a phone call could be made to notify her when this is done.

## 2024-01-05 NOTE — Telephone Encounter (Signed)
 Rx not needed now due to governors order she can get at any pharmacy

## 2024-01-16 ENCOUNTER — Ambulatory Visit (INDEPENDENT_AMBULATORY_CARE_PROVIDER_SITE_OTHER): Admitting: Radiology

## 2024-01-16 DIAGNOSIS — Z23 Encounter for immunization: Secondary | ICD-10-CM

## 2024-01-16 NOTE — Progress Notes (Signed)
 Patient here for Flu vaccine. Patient tolerated well with no complications.

## 2024-02-05 ENCOUNTER — Other Ambulatory Visit: Payer: BC Managed Care – PPO

## 2024-02-06 ENCOUNTER — Inpatient Hospital Stay: Payer: Self-pay | Attending: Hematology and Oncology

## 2024-02-06 DIAGNOSIS — C50412 Malignant neoplasm of upper-outer quadrant of left female breast: Secondary | ICD-10-CM

## 2024-02-06 LAB — GENETIC SCREENING ORDER

## 2024-02-10 ENCOUNTER — Other Ambulatory Visit: Payer: Self-pay | Admitting: *Deleted

## 2024-02-10 DIAGNOSIS — C7951 Secondary malignant neoplasm of bone: Secondary | ICD-10-CM

## 2024-02-12 ENCOUNTER — Other Ambulatory Visit: Payer: Self-pay | Admitting: Hematology and Oncology

## 2024-02-18 LAB — SIGNATERA ONLY (NATERA MANAGED)
SIGNATERA MTM READOUT: 1.3 MTM/ml — AB
SIGNATERA TEST RESULT: POSITIVE — AB

## 2024-02-26 ENCOUNTER — Inpatient Hospital Stay: Attending: Hematology and Oncology | Admitting: Hematology and Oncology

## 2024-02-26 VITALS — BP 125/74 | HR 84 | Temp 98.0°F | Resp 17 | Wt 158.4 lb

## 2024-02-26 DIAGNOSIS — M810 Age-related osteoporosis without current pathological fracture: Secondary | ICD-10-CM | POA: Diagnosis not present

## 2024-02-26 DIAGNOSIS — Z170 Estrogen receptor positive status [ER+]: Secondary | ICD-10-CM | POA: Diagnosis not present

## 2024-02-26 DIAGNOSIS — Z17411 Hormone receptor positive with human epidermal growth factor receptor 2 negative status: Secondary | ICD-10-CM | POA: Diagnosis not present

## 2024-02-26 DIAGNOSIS — C50412 Malignant neoplasm of upper-outer quadrant of left female breast: Secondary | ICD-10-CM | POA: Insufficient documentation

## 2024-02-26 DIAGNOSIS — Z79811 Long term (current) use of aromatase inhibitors: Secondary | ICD-10-CM | POA: Diagnosis not present

## 2024-02-26 NOTE — Assessment & Plan Note (Signed)
 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 Oncotype DX: 17, 10 to 12% distant recurrence risk over 10 years Post mastectomy radiation February 2010-May 2015: Tamoxifen  stopped because of endometrial hyperplasia 04/21/2014: Recurrence: Skin biopsy adenocarcinoma ER/PR positive 06/01/2014: Wide excision with negative margins 07/12/2014-08/16/2014: Chest wall radiation 05/06/2014: PET/CT: Activity in the right ischial tuberosity: Biopsy negative Repeat biopsy: Negative 05/09/2014-02/20/2015: Goserelin 03/13/2015: BSO: Benign 04/26/2019-05/05/2019: SBRT to right ischial tuberosity lesion 08/29/2014-08/28/2021: Anastrozole  stopped after 7 years   Prior treatment: Anastrozole  started 08/29/2014 -08/28/2021 along with Prolia  for osteoporosis (both have been discontinued)  Current treatment: Restarted anastrozole  October 2024    Surveillance: Signatera blood testing for circulating cell free DNA 10/2022: Signatera positive (0.02 MTM/ml) 02/07/2023: Burney positive (0.07 MTM/ml) 05/14/2022: Burney: Negative 08/07/2023: Signatera: +0.33   CT CAP and bone scan 09/01/2023: Similar appearance of the treated bone metastases right ischial tuberosity with no convincing evidence of active malignancy within CAP, bone scan: Stable mild uptake left first rib.   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  3 mm left lower lobe pulmonary nodule, bone scan: Stable T2 vertebral process uptake, small focus of uptake posterior calvarium   Follows with Dr. Collins who reviewed her scans and recommended a 57-month follow-up scans at [facility].  Calvarial lesion is felt to be nonspecific.    Patient is comfortable with the plan and will continue with anastrozole . We will see her back in 6 months after her next scan

## 2024-02-26 NOTE — Progress Notes (Signed)
 Patient Care Team: Rollene Almarie LABOR, MD as PCP - General (Internal Medicine) Ebbie Cough, MD as Consulting Physician (General Surgery) Pyrtle, Gordy HERO, MD as Consulting Physician (Gastroenterology) Buck Saucer, MD as Attending Physician (Neurology) Melba Pool, DDS as Referring Physician (Dentistry) Claudene Arthea HERO, DO as Consulting Physician (Family Medicine) Robinson Pao, MD as Consulting Physician (Dermatology) Izell Domino, MD as Attending Physician (Radiation Oncology) Melba Pool, DDS as Referring Physician (Dentistry) Odean Potts, MD as Consulting Physician (Hematology and Oncology)  DIAGNOSIS:  Encounter Diagnosis  Name Primary?   Malignant neoplasm of upper-outer quadrant of left breast in female, estrogen receptor positive (HCC) Yes    SUMMARY OF ONCOLOGIC HISTORY: Oncology History   No history exists.    CHIEF COMPLIANT: Follow-up to discuss the treatment plan  HISTORY OF PRESENT ILLNESS:   History of Present Illness Erin [name] is a 63 year old female with breast cancer who presents for follow-up regarding recent scan results and ongoing monitoring.  Recent imaging shows two small lesions, one in the spine and one on the skull. The spine lesion was identified in an addendum, and the skull lesion is under observation.  The Signatera test results have fluctuated, with a decrease in August followed by a recent increase, raising concerns about its role in her monitoring plan.  She is on Prolia , with the last dose on July 24th and the next dose scheduled for January. A bone density test is planned for tomorrow.     ALLERGIES:  is allergic to pollen extract and adhesive [tape].  MEDICATIONS:  Current Outpatient Medications  Medication Sig Dispense Refill   anastrozole  (ARIMIDEX ) 1 MG tablet TAKE 1 TABLET(1 MG) BY MOUTH DAILY 90 tablet 3   Ascorbic Acid (VITAMIN C PO) Take by mouth.     AUVI-Q  0.3 MG/0.3ML SOAJ injection  auto-injector into outer thigh Injection prn anaphylaxis for 30 days     Cholecalciferol (VITAMIN D3) 2000 UNITS capsule Take 2,000 Units by mouth daily.     Ciclopirox  1 % shampoo Use topically on scalp 120 mL 0   denosumab  (PROLIA ) 60 MG/ML SOSY injection Inject 60 mg into the skin every 6 (six) months.     fexofenadine (ALLEGRA) 180 MG tablet Take 180 mg by mouth daily as needed for allergies or rhinitis.     ibuprofen (ADVIL,MOTRIN) 200 MG tablet Take 200 mg by mouth every 6 (six) hours as needed for moderate pain.     levothyroxine  (SYNTHROID ) 75 MCG tablet TAKE 1 TABLET(75 MCG) BY MOUTH DAILY BEFORE BREAKFAST 90 tablet 3   losartan  (COZAAR ) 100 MG tablet TAKE 1 TABLET(100 MG) BY MOUTH DAILY 90 tablet 3   Omega-3 Fatty Acids (FISH OIL PO) Take by mouth.     Polyethyl Glycol-Propyl Glycol (SYSTANE OP) Place 1 drop into both eyes daily as needed (for allergies).      pyrithione zinc (SELSUN  BLUE DRY SCALP) 1 % shampoo 1 application in place of regular shampoo Externally Two times a Week     sodium chloride  (OCEAN) 0.65 % SOLN nasal spray Place 1 spray into both nostrils as needed for congestion.     Turmeric 500 MG CAPS Take 500 mg by mouth daily.      acyclovir  ointment (ZOVIRAX ) 5 % APPLY TOPICALLY TO THE AFFECTED AREA EVERY 3 HOURS AS NEEDED (Patient not taking: Reported on 02/26/2024) 5 g 5   Mometasone Furoate 370 MCG IMPL Apply 1 Application topically as directed. Five nights a week--0.1% topical solution for scalp (Patient  not taking: Reported on 02/26/2024)     No current facility-administered medications for this visit.    PHYSICAL EXAMINATION: ECOG PERFORMANCE STATUS: 1 - Symptomatic but completely ambulatory  Vitals:   02/26/24 1453  BP: 125/74  Pulse: 84  Resp: 17  Temp: 98 F (36.7 C)  SpO2: 100%   Filed Weights   02/26/24 1453  Weight: 158 lb 6.4 oz (71.8 kg)      LABORATORY DATA:  I have reviewed the data as listed    Latest Ref Rng & Units 11/13/2023    2:48  PM 07/24/2023    9:55 AM 05/08/2023    2:35 PM  CMP  Glucose 70 - 99 mg/dL 898  86  895   BUN 8 - 23 mg/dL 20  18  24    Creatinine 0.44 - 1.00 mg/dL 9.11  9.19  9.15   Sodium 135 - 145 mmol/L 133  136  134   Potassium 3.5 - 5.1 mmol/L 4.3  4.3  4.5   Chloride 98 - 111 mmol/L 102  102  101   CO2 22 - 32 mmol/L 26  30  30    Calcium 8.9 - 10.3 mg/dL 9.3  8.9  9.8   Total Protein 6.5 - 8.1 g/dL 6.7  6.7  6.6   Total Bilirubin 0.0 - 1.2 mg/dL 1.2  1.4  1.4   Alkaline Phos 38 - 126 U/L 54  57  88   AST 15 - 41 U/L 18  18  20    ALT 0 - 44 U/L 11  12  13      Lab Results  Component Value Date   WBC 6.1 11/13/2023   HGB 14.8 11/13/2023   HCT 41.5 11/13/2023   MCV 85.6 11/13/2023   PLT 192 11/13/2023   NEUTROABS 4.0 11/13/2023    ASSESSMENT & PLAN:  Malignant neoplasm of upper-outer quadrant of left breast in female, 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 Oncotype DX: 17, 10 to 12% distant recurrence risk over 10 years Post mastectomy radiation February 2010-May 2015: Tamoxifen  stopped because of endometrial hyperplasia 04/21/2014: Recurrence: Skin biopsy adenocarcinoma ER/PR positive 06/01/2014: Wide excision with negative margins 07/12/2014-08/16/2014: Chest wall radiation 05/06/2014: PET/CT: Activity in the right ischial tuberosity: Biopsy negative Repeat biopsy: Negative 05/09/2014-02/20/2015: Goserelin 03/13/2015: BSO: Benign 04/26/2019-05/05/2019: SBRT to right ischial tuberosity lesion 08/29/2014-08/28/2021: Anastrozole  stopped after 7 years   Prior treatment: Anastrozole  started 08/29/2014 -08/28/2021 along with Prolia  for osteoporosis (both have been discontinued)  Current treatment: Restarted anastrozole  October 2024    Surveillance: Signatera blood testing for circulating cell free DNA 10/2022: Signatera positive (0.02 MTM/ml) 02/07/2023: Burney positive (0.07 MTM/ml) 05/14/2022: Burney:  Negative 08/07/2023: Signatera: +0.33 02/17/2022: Signatera 1.3 (I recommended discontinuation of Signatera testing: Since these have not been useful)   CT CAP and bone scan 09/01/2023: Similar appearance of the treated bone metastases right ischial tuberosity with no convincing evidence of active malignancy within CAP, bone scan: Stable mild uptake left first rib.   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  3 mm left lower lobe pulmonary nodule, bone scan: Stable T2 vertebral process uptake, small focus of uptake posterior calvarium   Follows with Dr. Collins who reviewed her scans and recommended a 72-month follow-up scans at [facility].  Calvarial lesion is felt  to be nonspecific.    Patient is comfortable with the plan and will continue with anastrozole . We will see her back in 6 months after her next scan     No orders of the defined types were placed in this encounter.  The patient has a good understanding of the overall plan. she agrees with it. she will call with any problems that may develop before the next visit here.  I personally spent a total of 30 minutes in the care of the patient today including preparing to see the patient, getting/reviewing separately obtained history, performing a medically appropriate exam/evaluation, counseling and educating, placing orders, referring and communicating with other health care professionals, documenting clinical information in the EHR, independently interpreting results, communicating results, and coordinating care.   Viinay K Casandra Dallaire, MD 02/26/24

## 2024-02-27 ENCOUNTER — Ambulatory Visit
Admission: RE | Admit: 2024-02-27 | Discharge: 2024-02-27 | Disposition: A | Discharge status: Home / Self Care | Source: Ambulatory Visit | Attending: Internal Medicine | Admitting: Internal Medicine

## 2024-02-27 DIAGNOSIS — M8580 Other specified disorders of bone density and structure, unspecified site: Secondary | ICD-10-CM | POA: Diagnosis not present

## 2024-03-01 ENCOUNTER — Ambulatory Visit: Payer: Self-pay | Admitting: Internal Medicine

## 2024-05-06 ENCOUNTER — Encounter: Payer: Self-pay | Admitting: Obstetrics and Gynecology

## 2024-05-06 ENCOUNTER — Other Ambulatory Visit: Payer: Self-pay | Admitting: *Deleted

## 2024-05-06 ENCOUNTER — Ambulatory Visit: Payer: 59 | Admitting: Obstetrics and Gynecology

## 2024-05-06 VITALS — BP 114/70 | HR 67 | Ht 66.0 in | Wt 159.0 lb

## 2024-05-06 DIAGNOSIS — Z1331 Encounter for screening for depression: Secondary | ICD-10-CM | POA: Diagnosis not present

## 2024-05-06 DIAGNOSIS — Z01419 Encounter for gynecological examination (general) (routine) without abnormal findings: Secondary | ICD-10-CM

## 2024-05-06 NOTE — Progress Notes (Signed)
 Per MD and pt request, Signatera testing canceled at this time.

## 2024-05-06 NOTE — Patient Instructions (Signed)

## 2024-05-06 NOTE — Progress Notes (Signed)
 "  64 y.o. G0P0000 Single Caucasian female here for annual exam.    No GYN concerns.    Is back on Anastrozole .   Also back on Prolia  through Dr. Gara office.     PCP: Rollene Almarie LABOR, MD   No LMP recorded (lmp unknown). Patient is postmenopausal.           Sexually active: No.  The current method of family planning is none.    Menopausal hormone therapy:  n/a Exercising: Yes.    Walking  Smoker:  no  OB History  Gravida Para Term Preterm AB Living  0 0 0 0 0 0  SAB IAB Ectopic Multiple Live Births  0 0 0 0 0     HEALTH MAINTENANCE: Last 2 paps:  05/06/23 neg, HR HPV neg, 04/05/20 neg History of abnormal Pap or positive HPV:  no Mammogram:   05/12/14 Colonoscopy:  11/18/22 - normal.  Due in 2034. Bone Density:  02/27/24  Result osteopenia of hips and spine.  PCP following.  On Prolia .  Immunization History  Administered Date(s) Administered   INFLUENZA, HIGH DOSE SEASONAL PF 07/05/2019, 07/13/2020   Influenza Split 01/17/2012   Influenza, Seasonal, Injecte, Preservative Fre 01/17/2023, 01/16/2024   Influenza,inj,Quad PF,6+ Mos 01/15/2013, 01/14/2014, 01/13/2015, 01/09/2017, 01/30/2018, 01/30/2019, 01/21/2020, 01/26/2021, 01/16/2022   Influenza-Unspecified 01/05/2016   MMR 06/19/2018   PFIZER Comirnaty (Gray Top)Covid-19 Tri-Sucrose Vaccine 09/29/2020, 01/25/2022   PFIZER(Purple Top)SARS-COV-2 Vaccination 07/02/2019, 07/27/2019, 02/19/2020   PNEUMOCOCCAL CONJUGATE-20 10/10/2023   Pfizer Covid-19 Vaccine Bivalent Booster 40yrs & up 03/23/2021   Pfizer(Comirnaty )Fall Seasonal Vaccine 12 years and older 02/15/2023   Tdap 10/29/2016   Zoster Recombinant(Shingrix ) 09/25/2017, 12/18/2017   Zoster, Live 02/12/2013      reports that she has never smoked. She has never used smokeless tobacco. She reports that she does not drink alcohol and does not use drugs.  Past Medical History:  Diagnosis Date   Allergic rhinitis due to pollen    Breast cancer (HCC) 2010    Breast cancer (HCC) 2016   reoccurrence in scar   Diverticulosis    History of radiation therapy 04/26/19- 05/05/19   Right ischium 5 fractions of 10 Gy each to total 50 Gy.    Hypertension    Hypothyroidism    Osteopenia    S/P radiation therapy 07/12/2014 through 08/16/2014                                                        Left chest wall 4000 cGy in 20 sessions, left chest wall boost 1000 cGy in 5 sessions     Wears glasses     Past Surgical History:  Procedure Laterality Date   BREAST LUMPECTOMY Left 06/01/2014   Procedure: WIDE LOCAL EXCISION OF RECURRENT LEFT BREAST CANCER;  Surgeon: Donnice Bury, MD;  Location: [facility];  Service: General;  Laterality: Left;   COLONOSCOPY     cyst removed     left lower jaw - at age 70 yr   DILATATION & CURRETTAGE/HYSTEROSCOPY WITH RESECTOCOPE N/A 09/16/2013   Procedure: DILATATION & CURETTAGE/HYSTEROSCOPY WITH RESECTOCOPE;  Surgeon: Marie-Lyne Lavoie, MD;  Location: [facility];  Service: Gynecology;  Laterality: N/A;  1 hr.   INGUINAL HERNIA REPAIR     right side as infant   LAPAROSCOPIC PELVIC LYMPH NODE BIOPSY  MASTECTOMY  2010   bilateral mastectomies-lt snbx-radiation post   MASTECTOMY Bilateral    OOPHORECTOMY  2016   patient denies   Right knee arthroscopy  2008   ROBOTIC ASSISTED BILATERAL SALPINGO OOPHERECTOMY Bilateral 03/13/2015   Procedure: ROBOTIC ASSISTED BILATERAL SALPINGO OOPHORECTOMY With Washings;  Surgeon: Marie-Lyne Lavoie, MD;  Location: [facility];  Service: Gynecology;  Laterality: Bilateral;   TONSILLECTOMY     WISDOM TOOTH EXTRACTION      Current Outpatient Medications  Medication Sig Dispense Refill   acyclovir  ointment (ZOVIRAX ) 5 % APPLY TOPICALLY TO THE AFFECTED AREA EVERY 3 HOURS AS NEEDED 5 g 5   anastrozole  (ARIMIDEX ) 1 MG tablet TAKE 1 TABLET(1 MG) BY MOUTH DAILY 90 tablet 3   Ascorbic Acid (VITAMIN C PO) Take by mouth.     AUVI-Q  0.3 MG/0.3ML SOAJ injection auto-injector into outer  thigh Injection prn anaphylaxis for 30 days     Cholecalciferol (VITAMIN D3) 2000 UNITS capsule Take 2,000 Units by mouth daily.     Ciclopirox  1 % shampoo Use topically on scalp 120 mL 0   denosumab  (PROLIA ) 60 MG/ML SOSY injection Inject 60 mg into the skin every 6 (six) months.     fexofenadine (ALLEGRA) 180 MG tablet Take 180 mg by mouth daily as needed for allergies or rhinitis.     ibuprofen (ADVIL,MOTRIN) 200 MG tablet Take 200 mg by mouth every 6 (six) hours as needed for moderate pain.     levothyroxine  (SYNTHROID ) 75 MCG tablet TAKE 1 TABLET(75 MCG) BY MOUTH DAILY BEFORE BREAKFAST 90 tablet 3   losartan  (COZAAR ) 100 MG tablet TAKE 1 TABLET(100 MG) BY MOUTH DAILY 90 tablet 3   Mometasone Furoate 370 MCG IMPL Apply 1 Application topically as directed. Five nights a week--0.1% topical solution for scalp     Omega-3 Fatty Acids (FISH OIL PO) Take by mouth.     Polyethyl Glycol-Propyl Glycol (SYSTANE OP) Place 1 drop into both eyes daily as needed (for allergies).      pyrithione zinc (SELSUN  BLUE DRY SCALP) 1 % shampoo 1 application in place of regular shampoo Externally Two times a Week     sodium chloride  (OCEAN) 0.65 % SOLN nasal spray Place 1 spray into both nostrils as needed for congestion.     Turmeric 500 MG CAPS Take 500 mg by mouth daily.      No current facility-administered medications for this visit.    ALLERGIES: Pollen extract and Adhesive [tape]  Family History  Problem Relation Age of Onset   Hypertension Mother    Cancer Mother        Lung Cancer - long history of tobacco use   Hypertension Father    Stroke Maternal Grandfather    Stroke Paternal Grandfather    Hypertension Brother    Colon cancer Neg Hx    Stomach cancer Neg Hx     Review of Systems  All other systems reviewed and are negative.   PHYSICAL EXAM:  BP 114/70 (BP Location: Left Arm, Patient Position: Sitting)   Pulse 67   Ht 5' 6 (1.676 m)   Wt 159 lb (72.1 kg)   LMP  (LMP Unknown)    SpO2 97%   BMI 25.66 kg/m     General appearance: alert, cooperative and appears stated age Head: normocephalic, without obvious abnormality, atraumatic Neck: no adenopathy, supple, symmetrical, trachea midline and thyroid  normal to inspection and palpation Lungs: clear to auscultation bilaterally Breasts:absent bilaterally.  Left chest wall skin change and  scar tissue palpated in chest wall.  No axillary adenopathy Heart: regular rate and rhythm Abdomen: soft, non-tender; no masses, no organomegaly Extremities: extremities normal, atraumatic, no cyanosis or edema Skin: skin color, texture, turgor normal. No rashes or lesions Lymph nodes: cervical, supraclavicular, and axillary nodes normal. Neurologic: grossly normal  Pelvic: External genitalia:  no lesions              No abnormal inguinal nodes palpated.              Urethra:  normal appearing urethra with no masses, tenderness or lesions              Bartholins and Skenes: normal                 Vagina: normal appearing vagina with normal color and discharge, no lesions              Cervix: no lesions              Pap taken: no Bimanual Exam:  Uterus:  normal size, contour, position, consistency, mobility, non-tender              Adnexa: no mass, fullness, tenderness              Rectal exam: yes.  Confirms.              Anus:  normal sphincter tone, no lesions  Chaperone was present for exam:  Kari HERO, CMA  ASSESSMENT: Well woman visit with gynecologic exam. Status post laparoscopic BSO.  Hx left breast cancer with recurrence.   Status post bilateral mastectomy 2010. Status post ischial tuberosity radiation for potential bone cancer.  On Arimidex .  Negative BRCA testing.  Osteopenia.  On Prolia .  Small intramural fibroid. Vaginal atrophy.  PHQ-2-9: 0  PLAN: Mammogram screening discussed. Self breast awareness reviewed. Pap and HRV collected:  no. Due in 2030. Guidelines for Calcium, Vitamin D , regular exercise  program including cardiovascular and weight bearing exercise. Medication refills:  NA Dexa due in Nov. 2027.  Follow up:  yearly and prn.             "

## 2024-05-20 ENCOUNTER — Other Ambulatory Visit: Payer: Self-pay | Admitting: *Deleted

## 2024-05-20 DIAGNOSIS — C50412 Malignant neoplasm of upper-outer quadrant of left female breast: Secondary | ICD-10-CM

## 2024-05-21 ENCOUNTER — Inpatient Hospital Stay

## 2024-05-21 ENCOUNTER — Inpatient Hospital Stay: Attending: Hematology and Oncology

## 2024-05-21 VITALS — BP 133/75 | HR 77 | Temp 98.1°F | Resp 16

## 2024-05-21 DIAGNOSIS — C7951 Secondary malignant neoplasm of bone: Secondary | ICD-10-CM | POA: Diagnosis present

## 2024-05-21 DIAGNOSIS — C50412 Malignant neoplasm of upper-outer quadrant of left female breast: Secondary | ICD-10-CM | POA: Insufficient documentation

## 2024-05-21 DIAGNOSIS — Z170 Estrogen receptor positive status [ER+]: Secondary | ICD-10-CM | POA: Diagnosis not present

## 2024-05-21 DIAGNOSIS — C50912 Malignant neoplasm of unspecified site of left female breast: Secondary | ICD-10-CM

## 2024-05-21 DIAGNOSIS — N8500 Endometrial hyperplasia, unspecified: Secondary | ICD-10-CM

## 2024-05-21 LAB — CMP (CANCER CENTER ONLY)
ALT: 13 U/L (ref 0–44)
AST: 22 U/L (ref 15–41)
Albumin: 4.2 g/dL (ref 3.5–5.0)
Alkaline Phosphatase: 76 U/L (ref 38–126)
Anion gap: 11 (ref 5–15)
BUN: 24 mg/dL — ABNORMAL HIGH (ref 8–23)
CO2: 26 mmol/L (ref 22–32)
Calcium: 10 mg/dL (ref 8.9–10.3)
Chloride: 100 mmol/L (ref 98–111)
Creatinine: 0.97 mg/dL (ref 0.44–1.00)
GFR, Estimated: >60 mL/min
Glucose, Bld: 92 mg/dL (ref 70–99)
Potassium: 4.3 mmol/L (ref 3.5–5.1)
Sodium: 138 mmol/L (ref 135–145)
Total Bilirubin: 1.2 mg/dL (ref 0.0–1.2)
Total Protein: 7.2 g/dL (ref 6.5–8.1)

## 2024-05-21 LAB — CBC WITH DIFFERENTIAL (CANCER CENTER ONLY)
Abs Immature Granulocytes: 0.02 10*3/uL (ref 0.00–0.07)
Basophils Absolute: 0 10*3/uL (ref 0.0–0.1)
Basophils Relative: 1 %
Eosinophils Absolute: 0.1 10*3/uL (ref 0.0–0.5)
Eosinophils Relative: 2 %
HCT: 43.3 % (ref 36.0–46.0)
Hemoglobin: 15 g/dL (ref 12.0–15.0)
Immature Granulocytes: 0 %
Lymphocytes Relative: 25 %
Lymphs Abs: 1.5 10*3/uL (ref 0.7–4.0)
MCH: 29.7 pg (ref 26.0–34.0)
MCHC: 34.6 g/dL (ref 30.0–36.0)
MCV: 85.7 fL (ref 80.0–100.0)
Monocytes Absolute: 0.6 10*3/uL (ref 0.1–1.0)
Monocytes Relative: 9 %
Neutro Abs: 3.9 10*3/uL (ref 1.7–7.7)
Neutrophils Relative %: 63 %
Platelet Count: 194 10*3/uL (ref 150–400)
RBC: 5.05 MIL/uL (ref 3.87–5.11)
RDW: 11.9 % (ref 11.5–15.5)
WBC Count: 6.1 10*3/uL (ref 4.0–10.5)
nRBC: 0 % (ref 0.0–0.2)

## 2024-05-21 MED ORDER — DENOSUMAB 60 MG/ML ~~LOC~~ SOSY
60.0000 mg | PREFILLED_SYRINGE | Freq: Once | SUBCUTANEOUS | Status: AC
  Administered 2024-05-21: 60 mg via SUBCUTANEOUS
  Filled 2024-05-21: qty 1

## 2024-05-21 MED ORDER — DENOSUMAB 60 MG/ML ~~LOC~~ SOSY: 60.0000 mg | PREFILLED_SYRINGE | Freq: Once | SUBCUTANEOUS | Status: DC

## 2024-08-06 ENCOUNTER — Encounter: Admitting: Internal Medicine

## 2024-08-30 ENCOUNTER — Inpatient Hospital Stay: Attending: Hematology and Oncology | Admitting: Hematology and Oncology

## 2025-05-09 ENCOUNTER — Ambulatory Visit: Admitting: Obstetrics and Gynecology
